# Patient Record
Sex: Female | Born: 1950
Health system: Southern US, Community
[De-identification: ages and names within clinical notes are randomized; demographics above are authoritative.]

## PROBLEM LIST (undated history)

## (undated) DIAGNOSIS — G4700 Insomnia, unspecified: Secondary | ICD-10-CM

## (undated) DIAGNOSIS — E039 Hypothyroidism, unspecified: Secondary | ICD-10-CM

## (undated) DIAGNOSIS — J449 Chronic obstructive pulmonary disease, unspecified: Secondary | ICD-10-CM

## (undated) DIAGNOSIS — E669 Obesity, unspecified: Secondary | ICD-10-CM

## (undated) DIAGNOSIS — E785 Hyperlipidemia, unspecified: Secondary | ICD-10-CM

## (undated) DIAGNOSIS — K219 Gastro-esophageal reflux disease without esophagitis: Secondary | ICD-10-CM

## (undated) DIAGNOSIS — M1990 Unspecified osteoarthritis, unspecified site: Secondary | ICD-10-CM

## (undated) DIAGNOSIS — F419 Anxiety disorder, unspecified: Secondary | ICD-10-CM

## (undated) DIAGNOSIS — F329 Major depressive disorder, single episode, unspecified: Secondary | ICD-10-CM

## (undated) DIAGNOSIS — E109 Type 1 diabetes mellitus without complications: Secondary | ICD-10-CM

## (undated) DIAGNOSIS — I10 Essential (primary) hypertension: Secondary | ICD-10-CM

## (undated) HISTORY — DX: Type 1 diabetes mellitus without complications: E10.9

## (undated) HISTORY — PX: BREAST EXCISIONAL BIOPSY: SUR124

## (undated) HISTORY — PX: TONSILLECTOMY: SUR1361

## (undated) HISTORY — DX: Anxiety disorder, unspecified: F41.9

## (undated) HISTORY — DX: Major depressive disorder, single episode, unspecified: F32.9

## (undated) HISTORY — DX: Gastro-esophageal reflux disease without esophagitis: K21.9

## (undated) HISTORY — DX: Obesity, unspecified: E66.9

## (undated) HISTORY — DX: Unspecified osteoarthritis, unspecified site: M19.90

## (undated) HISTORY — PX: APPENDECTOMY: SHX54

## (undated) HISTORY — DX: Chronic obstructive pulmonary disease, unspecified: J44.9

## (undated) HISTORY — PX: TOTAL ABDOMINAL HYSTERECTOMY W/ BILATERAL SALPINGOOPHORECTOMY: SHX83

## (undated) HISTORY — DX: Essential (primary) hypertension: I10

## (undated) HISTORY — DX: Hyperlipidemia, unspecified: E78.5

## (undated) HISTORY — DX: Hypothyroidism, unspecified: E03.9

## (undated) HISTORY — PX: OTHER SURGICAL HISTORY: SHX169

## (undated) HISTORY — DX: Insomnia, unspecified: G47.00

---

## 1981-06-02 DIAGNOSIS — I10 Essential (primary) hypertension: Secondary | ICD-10-CM

## 1981-06-02 DIAGNOSIS — E109 Type 1 diabetes mellitus without complications: Secondary | ICD-10-CM

## 1981-06-02 HISTORY — DX: Essential (primary) hypertension: I10

## 1981-06-02 HISTORY — DX: Type 1 diabetes mellitus without complications: E10.9

## 2002-03-01 ENCOUNTER — Ambulatory Visit (HOSPITAL_COMMUNITY): Admission: RE | Admit: 2002-03-01 | Discharge: 2002-03-01 | Payer: Self-pay | Admitting: Obstetrics

## 2002-03-01 ENCOUNTER — Encounter: Payer: Self-pay | Admitting: Obstetrics

## 2002-06-02 DIAGNOSIS — E039 Hypothyroidism, unspecified: Secondary | ICD-10-CM

## 2002-06-02 HISTORY — DX: Hypothyroidism, unspecified: E03.9

## 2002-09-07 ENCOUNTER — Encounter: Payer: Self-pay | Admitting: Family Medicine

## 2002-09-07 ENCOUNTER — Ambulatory Visit (HOSPITAL_COMMUNITY): Admission: RE | Admit: 2002-09-07 | Discharge: 2002-09-07 | Payer: Self-pay | Admitting: Family Medicine

## 2002-09-13 ENCOUNTER — Ambulatory Visit (HOSPITAL_COMMUNITY): Admission: RE | Admit: 2002-09-13 | Discharge: 2002-09-13 | Payer: Self-pay | Admitting: Family Medicine

## 2002-09-13 ENCOUNTER — Encounter: Payer: Self-pay | Admitting: Family Medicine

## 2002-09-16 ENCOUNTER — Ambulatory Visit (HOSPITAL_COMMUNITY): Admission: RE | Admit: 2002-09-16 | Discharge: 2002-09-16 | Payer: Self-pay | Admitting: Family Medicine

## 2002-09-16 ENCOUNTER — Encounter: Payer: Self-pay | Admitting: Family Medicine

## 2002-10-03 ENCOUNTER — Ambulatory Visit (HOSPITAL_COMMUNITY): Admission: RE | Admit: 2002-10-03 | Discharge: 2002-10-03 | Payer: Self-pay | Admitting: Otolaryngology

## 2002-10-03 ENCOUNTER — Encounter: Payer: Self-pay | Admitting: Otolaryngology

## 2002-10-26 ENCOUNTER — Encounter (INDEPENDENT_AMBULATORY_CARE_PROVIDER_SITE_OTHER): Payer: Self-pay | Admitting: Specialist

## 2002-10-26 ENCOUNTER — Ambulatory Visit (HOSPITAL_COMMUNITY): Admission: RE | Admit: 2002-10-26 | Discharge: 2002-10-27 | Payer: Self-pay | Admitting: Otolaryngology

## 2002-11-01 ENCOUNTER — Emergency Department (HOSPITAL_COMMUNITY): Admission: EM | Admit: 2002-11-01 | Discharge: 2002-11-02 | Payer: Self-pay | Admitting: Emergency Medicine

## 2003-06-23 ENCOUNTER — Ambulatory Visit (HOSPITAL_COMMUNITY): Admission: RE | Admit: 2003-06-23 | Discharge: 2003-06-23 | Payer: Self-pay | Admitting: Family Medicine

## 2004-05-03 ENCOUNTER — Ambulatory Visit (HOSPITAL_COMMUNITY): Admission: RE | Admit: 2004-05-03 | Discharge: 2004-05-03 | Payer: Self-pay | Admitting: Family Medicine

## 2004-05-07 ENCOUNTER — Inpatient Hospital Stay (HOSPITAL_COMMUNITY): Admission: EM | Admit: 2004-05-07 | Discharge: 2004-05-10 | Payer: Self-pay | Admitting: Emergency Medicine

## 2004-05-07 ENCOUNTER — Ambulatory Visit (HOSPITAL_COMMUNITY): Admission: RE | Admit: 2004-05-07 | Discharge: 2004-05-07 | Payer: Self-pay | Admitting: Family Medicine

## 2004-05-07 ENCOUNTER — Ambulatory Visit: Payer: Self-pay | Admitting: Family Medicine

## 2004-05-17 ENCOUNTER — Ambulatory Visit: Payer: Self-pay | Admitting: Family Medicine

## 2004-06-13 ENCOUNTER — Inpatient Hospital Stay (HOSPITAL_COMMUNITY): Admission: RE | Admit: 2004-06-13 | Discharge: 2004-06-28 | Payer: Self-pay | Admitting: Thoracic Surgery

## 2004-06-13 ENCOUNTER — Ambulatory Visit: Payer: Self-pay | Admitting: Pulmonary Disease

## 2004-06-26 ENCOUNTER — Ambulatory Visit: Payer: Self-pay | Admitting: Physical Medicine & Rehabilitation

## 2004-06-28 ENCOUNTER — Inpatient Hospital Stay (HOSPITAL_COMMUNITY)
Admission: RE | Admit: 2004-06-28 | Discharge: 2004-07-05 | Payer: Self-pay | Admitting: Physical Medicine & Rehabilitation

## 2004-07-17 ENCOUNTER — Ambulatory Visit: Payer: Self-pay | Admitting: Family Medicine

## 2004-07-29 ENCOUNTER — Emergency Department (HOSPITAL_COMMUNITY): Admission: EM | Admit: 2004-07-29 | Discharge: 2004-07-30 | Payer: Self-pay | Admitting: *Deleted

## 2004-08-11 ENCOUNTER — Inpatient Hospital Stay (HOSPITAL_COMMUNITY): Admission: EM | Admit: 2004-08-11 | Discharge: 2004-08-13 | Payer: Self-pay | Admitting: Emergency Medicine

## 2004-08-15 ENCOUNTER — Ambulatory Visit (HOSPITAL_COMMUNITY): Admission: RE | Admit: 2004-08-15 | Discharge: 2004-08-15 | Payer: Self-pay | Admitting: Thoracic Surgery

## 2004-08-21 ENCOUNTER — Ambulatory Visit: Payer: Self-pay | Admitting: Family Medicine

## 2004-08-27 ENCOUNTER — Inpatient Hospital Stay (HOSPITAL_COMMUNITY): Admission: EM | Admit: 2004-08-27 | Discharge: 2004-08-29 | Payer: Self-pay | Admitting: Emergency Medicine

## 2004-09-02 ENCOUNTER — Ambulatory Visit (HOSPITAL_COMMUNITY): Admission: RE | Admit: 2004-09-02 | Discharge: 2004-09-02 | Payer: Self-pay | Admitting: Otolaryngology

## 2004-09-26 ENCOUNTER — Ambulatory Visit: Payer: Self-pay | Admitting: Family Medicine

## 2004-10-04 ENCOUNTER — Ambulatory Visit (HOSPITAL_COMMUNITY): Admission: RE | Admit: 2004-10-04 | Discharge: 2004-10-04 | Payer: Self-pay | Admitting: Family Medicine

## 2004-10-04 ENCOUNTER — Encounter: Payer: Self-pay | Admitting: Family Medicine

## 2004-10-21 ENCOUNTER — Ambulatory Visit: Payer: Self-pay | Admitting: Family Medicine

## 2004-10-23 ENCOUNTER — Encounter: Admission: RE | Admit: 2004-10-23 | Discharge: 2004-10-23 | Payer: Self-pay | Admitting: Thoracic Surgery

## 2004-11-18 ENCOUNTER — Emergency Department (HOSPITAL_COMMUNITY): Admission: EM | Admit: 2004-11-18 | Discharge: 2004-11-18 | Payer: Self-pay | Admitting: Emergency Medicine

## 2004-11-18 ENCOUNTER — Emergency Department (HOSPITAL_COMMUNITY): Admission: EM | Admit: 2004-11-18 | Discharge: 2004-11-18 | Payer: Self-pay | Admitting: *Deleted

## 2004-11-26 ENCOUNTER — Ambulatory Visit: Payer: Self-pay | Admitting: Family Medicine

## 2004-12-06 ENCOUNTER — Observation Stay (HOSPITAL_COMMUNITY): Admission: EM | Admit: 2004-12-06 | Discharge: 2004-12-07 | Payer: Self-pay | Admitting: Emergency Medicine

## 2004-12-19 ENCOUNTER — Ambulatory Visit: Payer: Self-pay | Admitting: Family Medicine

## 2005-02-07 ENCOUNTER — Ambulatory Visit: Payer: Self-pay | Admitting: Family Medicine

## 2005-02-14 ENCOUNTER — Ambulatory Visit (HOSPITAL_COMMUNITY): Admission: RE | Admit: 2005-02-14 | Discharge: 2005-02-14 | Payer: Self-pay | Admitting: Family Medicine

## 2005-02-25 ENCOUNTER — Encounter: Admission: RE | Admit: 2005-02-25 | Discharge: 2005-02-25 | Payer: Self-pay | Admitting: Thoracic Surgery

## 2005-03-31 ENCOUNTER — Ambulatory Visit: Payer: Self-pay | Admitting: Family Medicine

## 2005-05-21 ENCOUNTER — Ambulatory Visit: Payer: Self-pay | Admitting: Family Medicine

## 2005-06-02 DIAGNOSIS — E785 Hyperlipidemia, unspecified: Secondary | ICD-10-CM

## 2005-06-02 HISTORY — DX: Hyperlipidemia, unspecified: E78.5

## 2005-06-23 ENCOUNTER — Ambulatory Visit: Payer: Self-pay | Admitting: Family Medicine

## 2005-07-15 ENCOUNTER — Ambulatory Visit: Payer: Self-pay | Admitting: Pulmonary Disease

## 2005-08-29 ENCOUNTER — Ambulatory Visit: Payer: Self-pay | Admitting: Family Medicine

## 2005-09-03 ENCOUNTER — Encounter: Admission: RE | Admit: 2005-09-03 | Discharge: 2005-09-03 | Payer: Self-pay | Admitting: Thoracic Surgery

## 2005-10-07 ENCOUNTER — Ambulatory Visit: Payer: Self-pay | Admitting: Family Medicine

## 2005-11-26 ENCOUNTER — Encounter: Admission: RE | Admit: 2005-11-26 | Discharge: 2005-11-26 | Payer: Self-pay | Admitting: Unknown Physician Specialty

## 2005-11-26 ENCOUNTER — Ambulatory Visit (HOSPITAL_COMMUNITY): Admission: RE | Admit: 2005-11-26 | Discharge: 2005-11-26 | Payer: Self-pay | Admitting: Family Medicine

## 2005-11-26 LAB — HM COLONOSCOPY: HM Colonoscopy: NORMAL

## 2005-12-01 ENCOUNTER — Ambulatory Visit (HOSPITAL_COMMUNITY): Admission: RE | Admit: 2005-12-01 | Discharge: 2005-12-01 | Payer: Self-pay | Admitting: Family Medicine

## 2005-12-02 ENCOUNTER — Ambulatory Visit: Payer: Self-pay | Admitting: Family Medicine

## 2005-12-09 ENCOUNTER — Ambulatory Visit: Payer: Self-pay | Admitting: Internal Medicine

## 2005-12-30 ENCOUNTER — Ambulatory Visit: Payer: Self-pay | Admitting: Family Medicine

## 2006-02-27 ENCOUNTER — Ambulatory Visit: Payer: Self-pay | Admitting: Family Medicine

## 2006-04-09 ENCOUNTER — Ambulatory Visit: Payer: Self-pay | Admitting: Family Medicine

## 2006-04-30 ENCOUNTER — Ambulatory Visit (HOSPITAL_COMMUNITY): Admission: RE | Admit: 2006-04-30 | Discharge: 2006-04-30 | Payer: Self-pay | Admitting: Pulmonary Disease

## 2006-05-11 ENCOUNTER — Ambulatory Visit (HOSPITAL_COMMUNITY): Admission: RE | Admit: 2006-05-11 | Discharge: 2006-05-11 | Payer: Self-pay | Admitting: Pulmonary Disease

## 2006-06-02 DIAGNOSIS — F32A Depression, unspecified: Secondary | ICD-10-CM

## 2006-06-02 HISTORY — DX: Depression, unspecified: F32.A

## 2006-06-04 ENCOUNTER — Ambulatory Visit: Payer: Self-pay | Admitting: Family Medicine

## 2006-06-15 ENCOUNTER — Encounter: Payer: Self-pay | Admitting: Family Medicine

## 2006-06-15 LAB — CONVERTED CEMR LAB: Hgb A1c MFr Bld: 7.7 % — ABNORMAL HIGH (ref 4.6–6.1)

## 2006-06-22 ENCOUNTER — Ambulatory Visit: Payer: Self-pay | Admitting: Family Medicine

## 2006-06-22 LAB — CONVERTED CEMR LAB
Glucose, Bld: 119 mg/dL — ABNORMAL HIGH (ref 70–99)
Potassium: 4 meq/L (ref 3.5–5.3)
TSH: 1.783 microintl units/mL (ref 0.350–5.50)

## 2006-07-21 ENCOUNTER — Encounter: Payer: Self-pay | Admitting: Family Medicine

## 2006-07-21 LAB — CONVERTED CEMR LAB
BUN: 25 mg/dL — ABNORMAL HIGH (ref 6–23)
Calcium: 9.8 mg/dL (ref 8.4–10.5)
Creatinine, Ser: 1.3 mg/dL — ABNORMAL HIGH (ref 0.40–1.20)
Glucose, Bld: 190 mg/dL — ABNORMAL HIGH (ref 70–99)
Potassium: 4.6 meq/L (ref 3.5–5.3)

## 2006-11-19 ENCOUNTER — Ambulatory Visit: Payer: Self-pay | Admitting: Family Medicine

## 2006-11-30 ENCOUNTER — Ambulatory Visit (HOSPITAL_COMMUNITY): Admission: RE | Admit: 2006-11-30 | Discharge: 2006-11-30 | Payer: Self-pay | Admitting: Family Medicine

## 2006-12-01 ENCOUNTER — Ambulatory Visit (HOSPITAL_COMMUNITY): Payer: Self-pay | Admitting: Psychology

## 2006-12-02 ENCOUNTER — Encounter: Payer: Self-pay | Admitting: Family Medicine

## 2006-12-02 LAB — CONVERTED CEMR LAB
ALT: 8 units/L (ref 0–35)
AST: 11 units/L (ref 0–37)
Albumin: 4 g/dL (ref 3.5–5.2)
Alkaline Phosphatase: 109 units/L (ref 39–117)
Microalb, Ur: 0.72 mg/dL (ref 0.00–1.89)
Potassium: 5.1 meq/L (ref 3.5–5.3)
Total Protein: 6.8 g/dL (ref 6.0–8.3)

## 2006-12-16 ENCOUNTER — Ambulatory Visit (HOSPITAL_COMMUNITY): Payer: Self-pay | Admitting: Psychology

## 2006-12-17 ENCOUNTER — Encounter: Payer: Self-pay | Admitting: Family Medicine

## 2006-12-17 LAB — CONVERTED CEMR LAB
BUN: 29 mg/dL — ABNORMAL HIGH (ref 6–23)
CO2: 22 meq/L (ref 19–32)
Chloride: 106 meq/L (ref 96–112)
Glucose, Bld: 134 mg/dL — ABNORMAL HIGH (ref 70–99)

## 2006-12-31 ENCOUNTER — Ambulatory Visit: Payer: Self-pay | Admitting: Family Medicine

## 2007-01-18 ENCOUNTER — Ambulatory Visit (HOSPITAL_COMMUNITY): Payer: Self-pay | Admitting: Psychology

## 2007-02-25 ENCOUNTER — Emergency Department (HOSPITAL_COMMUNITY): Admission: EM | Admit: 2007-02-25 | Discharge: 2007-02-25 | Payer: Self-pay | Admitting: Emergency Medicine

## 2007-02-26 ENCOUNTER — Ambulatory Visit: Payer: Self-pay | Admitting: Family Medicine

## 2007-02-26 ENCOUNTER — Ambulatory Visit (HOSPITAL_COMMUNITY): Payer: Self-pay | Admitting: Psychology

## 2007-03-26 ENCOUNTER — Ambulatory Visit (HOSPITAL_COMMUNITY): Payer: Self-pay | Admitting: Psychology

## 2007-04-01 ENCOUNTER — Ambulatory Visit (HOSPITAL_COMMUNITY): Payer: Self-pay | Admitting: Psychiatry

## 2007-04-01 ENCOUNTER — Encounter: Payer: Self-pay | Admitting: Family Medicine

## 2007-04-01 LAB — CONVERTED CEMR LAB
ALT: 8 units/L (ref 0–35)
BUN: 23 mg/dL (ref 6–23)
CO2: 22 meq/L (ref 19–32)
Chloride: 106 meq/L (ref 96–112)
Cholesterol: 148 mg/dL (ref 0–200)
Creatinine, Ser: 1.65 mg/dL — ABNORMAL HIGH (ref 0.40–1.20)
Glucose, Bld: 178 mg/dL — ABNORMAL HIGH (ref 70–99)
Indirect Bilirubin: 0.2 mg/dL (ref 0.0–0.9)
LDL Cholesterol: 82 mg/dL (ref 0–99)
Sodium: 141 meq/L (ref 135–145)
Total CHOL/HDL Ratio: 3.6
Triglycerides: 126 mg/dL (ref <150)

## 2007-04-08 ENCOUNTER — Ambulatory Visit: Payer: Self-pay | Admitting: Family Medicine

## 2007-04-13 ENCOUNTER — Ambulatory Visit: Payer: Self-pay | Admitting: Family Medicine

## 2007-04-28 ENCOUNTER — Ambulatory Visit (HOSPITAL_COMMUNITY): Payer: Self-pay | Admitting: Psychology

## 2007-04-28 ENCOUNTER — Ambulatory Visit: Payer: Self-pay | Admitting: Family Medicine

## 2007-06-03 ENCOUNTER — Encounter: Payer: Self-pay | Admitting: Family Medicine

## 2007-06-09 ENCOUNTER — Encounter: Payer: Self-pay | Admitting: Family Medicine

## 2007-06-09 LAB — CONVERTED CEMR LAB: TSH: 2.546 microintl units/mL (ref 0.350–5.50)

## 2007-07-27 ENCOUNTER — Ambulatory Visit: Payer: Self-pay | Admitting: Family Medicine

## 2007-07-27 ENCOUNTER — Ambulatory Visit (HOSPITAL_COMMUNITY): Payer: Self-pay | Admitting: Psychology

## 2007-07-29 ENCOUNTER — Ambulatory Visit: Payer: Self-pay | Admitting: Family Medicine

## 2007-08-24 ENCOUNTER — Ambulatory Visit (HOSPITAL_COMMUNITY): Payer: Self-pay | Admitting: Psychology

## 2007-08-31 ENCOUNTER — Encounter: Payer: Self-pay | Admitting: Family Medicine

## 2007-08-31 LAB — CONVERTED CEMR LAB
ALT: 10 units/L (ref 0–35)
Albumin: 4 g/dL (ref 3.5–5.2)
Alkaline Phosphatase: 110 units/L (ref 39–117)
BUN: 39 mg/dL — ABNORMAL HIGH (ref 6–23)
Bilirubin, Direct: <0.1 mg/dL (ref 0.0–0.3)
Calcium: 9 mg/dL (ref 8.4–10.5)
Chloride: 105 meq/L (ref 96–112)
Creatinine, Ser: 1.83 mg/dL — ABNORMAL HIGH (ref 0.40–1.20)
Glucose, Bld: 228 mg/dL — ABNORMAL HIGH (ref 70–99)
HDL: 42 mg/dL (ref >39)
Total CHOL/HDL Ratio: 4

## 2007-09-02 ENCOUNTER — Ambulatory Visit: Payer: Self-pay | Admitting: Family Medicine

## 2007-09-06 ENCOUNTER — Telehealth: Payer: Self-pay | Admitting: Family Medicine

## 2007-09-08 DIAGNOSIS — K219 Gastro-esophageal reflux disease without esophagitis: Secondary | ICD-10-CM | POA: Insufficient documentation

## 2007-09-08 DIAGNOSIS — F419 Anxiety disorder, unspecified: Secondary | ICD-10-CM

## 2007-09-08 DIAGNOSIS — E039 Hypothyroidism, unspecified: Secondary | ICD-10-CM

## 2007-09-08 DIAGNOSIS — G4700 Insomnia, unspecified: Secondary | ICD-10-CM | POA: Insufficient documentation

## 2007-09-08 DIAGNOSIS — F329 Major depressive disorder, single episode, unspecified: Secondary | ICD-10-CM | POA: Insufficient documentation

## 2007-09-08 DIAGNOSIS — E785 Hyperlipidemia, unspecified: Secondary | ICD-10-CM | POA: Insufficient documentation

## 2007-09-08 DIAGNOSIS — J449 Chronic obstructive pulmonary disease, unspecified: Secondary | ICD-10-CM | POA: Insufficient documentation

## 2007-09-08 DIAGNOSIS — M479 Spondylosis, unspecified: Secondary | ICD-10-CM | POA: Insufficient documentation

## 2007-09-27 ENCOUNTER — Encounter: Payer: Self-pay | Admitting: Family Medicine

## 2007-09-27 LAB — CONVERTED CEMR LAB
BUN: 46 mg/dL — ABNORMAL HIGH (ref 6–23)
Calcium: 9 mg/dL (ref 8.4–10.5)
Chloride: 108 meq/L (ref 96–112)
Creatinine, Ser: 1.66 mg/dL — ABNORMAL HIGH (ref 0.40–1.20)
Potassium: 5 meq/L (ref 3.5–5.3)
Sodium: 138 meq/L (ref 135–145)

## 2007-10-11 ENCOUNTER — Ambulatory Visit (HOSPITAL_COMMUNITY): Payer: Self-pay | Admitting: Psychology

## 2007-11-10 ENCOUNTER — Ambulatory Visit (HOSPITAL_COMMUNITY): Payer: Self-pay | Admitting: Psychology

## 2007-11-10 ENCOUNTER — Ambulatory Visit: Payer: Self-pay | Admitting: Family Medicine

## 2008-02-04 ENCOUNTER — Ambulatory Visit (HOSPITAL_COMMUNITY): Payer: Self-pay | Admitting: Psychology

## 2008-03-06 ENCOUNTER — Ambulatory Visit: Payer: Self-pay | Admitting: Family Medicine

## 2008-03-06 ENCOUNTER — Telehealth: Payer: Self-pay | Admitting: Family Medicine

## 2008-03-06 DIAGNOSIS — I739 Peripheral vascular disease, unspecified: Secondary | ICD-10-CM

## 2008-03-06 LAB — CONVERTED CEMR LAB: Hgb A1c MFr Bld: 8.3 %

## 2008-03-07 DIAGNOSIS — I10 Essential (primary) hypertension: Secondary | ICD-10-CM

## 2008-03-08 LAB — CONVERTED CEMR LAB
AST: 13 units/L (ref 0–37)
Albumin: 3.8 g/dL (ref 3.5–5.2)
Alkaline Phosphatase: 97 units/L (ref 39–117)
Bilirubin, Direct: <0.1 mg/dL (ref 0.0–0.3)
CO2: 20 meq/L (ref 19–32)
Calcium: 9.2 mg/dL (ref 8.4–10.5)
Creatinine, Ser: 1.52 mg/dL — ABNORMAL HIGH (ref 0.40–1.20)
Eosinophils Relative: 9 % — ABNORMAL HIGH (ref 0–5)
Hemoglobin: 10.9 g/dL — ABNORMAL LOW (ref 12.0–15.0)
Lymphs Abs: 4 10*3/uL (ref 0.7–4.0)
MCV: 94.8 fL (ref 78.0–100.0)
Microalb Creat Ratio: 2.8 mg/g (ref 0.0–30.0)
Neutro Abs: 5.5 10*3/uL (ref 1.7–7.7)
Neutrophils Relative %: 48 % (ref 43–77)
Potassium: 5.4 meq/L — ABNORMAL HIGH (ref 3.5–5.3)
RBC: 3.83 M/uL — ABNORMAL LOW (ref 3.87–5.11)
Total Bilirubin: 0.2 mg/dL — ABNORMAL LOW (ref 0.3–1.2)
Total CHOL/HDL Ratio: 3.5
Triglycerides: 149 mg/dL (ref <150)
VLDL: 30 mg/dL (ref 0–40)
WBC: 11.3 10*3/uL — ABNORMAL HIGH (ref 4.0–10.5)

## 2008-03-14 ENCOUNTER — Ambulatory Visit (HOSPITAL_COMMUNITY): Admission: RE | Admit: 2008-03-14 | Discharge: 2008-03-14 | Payer: Self-pay | Admitting: Family Medicine

## 2008-03-14 ENCOUNTER — Encounter: Payer: Self-pay | Admitting: Family Medicine

## 2008-03-21 ENCOUNTER — Ambulatory Visit (HOSPITAL_COMMUNITY): Payer: Self-pay | Admitting: Psychology

## 2008-04-06 ENCOUNTER — Telehealth: Payer: Self-pay | Admitting: Family Medicine

## 2008-04-11 ENCOUNTER — Encounter: Payer: Self-pay | Admitting: Family Medicine

## 2008-04-20 ENCOUNTER — Telehealth: Payer: Self-pay | Admitting: Family Medicine

## 2008-04-20 ENCOUNTER — Ambulatory Visit (HOSPITAL_COMMUNITY): Payer: Self-pay | Admitting: Psychology

## 2008-05-19 ENCOUNTER — Ambulatory Visit (HOSPITAL_COMMUNITY): Payer: Self-pay | Admitting: Psychology

## 2008-06-29 ENCOUNTER — Ambulatory Visit (HOSPITAL_COMMUNITY): Payer: Self-pay | Admitting: Psychology

## 2008-07-04 ENCOUNTER — Encounter: Payer: Self-pay | Admitting: Family Medicine

## 2008-07-05 ENCOUNTER — Telehealth: Payer: Self-pay | Admitting: Family Medicine

## 2008-07-17 ENCOUNTER — Ambulatory Visit: Payer: Self-pay | Admitting: Family Medicine

## 2008-07-17 DIAGNOSIS — E1165 Type 2 diabetes mellitus with hyperglycemia: Secondary | ICD-10-CM

## 2008-07-25 ENCOUNTER — Ambulatory Visit (HOSPITAL_COMMUNITY): Payer: Self-pay | Admitting: Psychology

## 2008-08-03 ENCOUNTER — Telehealth: Payer: Self-pay | Admitting: Family Medicine

## 2008-08-04 ENCOUNTER — Encounter: Payer: Self-pay | Admitting: Family Medicine

## 2008-08-07 ENCOUNTER — Telehealth: Payer: Self-pay | Admitting: Family Medicine

## 2008-08-08 ENCOUNTER — Encounter: Payer: Self-pay | Admitting: Family Medicine

## 2008-08-08 ENCOUNTER — Telehealth: Payer: Self-pay | Admitting: Family Medicine

## 2008-08-21 ENCOUNTER — Telehealth: Payer: Self-pay | Admitting: Family Medicine

## 2008-08-21 ENCOUNTER — Encounter: Payer: Self-pay | Admitting: Family Medicine

## 2008-09-22 ENCOUNTER — Ambulatory Visit (HOSPITAL_COMMUNITY): Payer: Self-pay | Admitting: Psychology

## 2008-09-22 ENCOUNTER — Encounter: Payer: Self-pay | Admitting: Family Medicine

## 2008-09-25 LAB — CONVERTED CEMR LAB
ALT: 12 units/L (ref 0–35)
AST: 15 units/L (ref 0–37)
Albumin: 4.1 g/dL (ref 3.5–5.2)
Alkaline Phosphatase: 106 units/L (ref 39–117)
BUN: 25 mg/dL — ABNORMAL HIGH (ref 6–23)
Bilirubin, Direct: 0.1 mg/dL (ref 0.0–0.3)
CO2: 23 meq/L (ref 19–32)
Calcium: 9.5 mg/dL (ref 8.4–10.5)
Chloride: 101 meq/L (ref 96–112)
Cholesterol: 175 mg/dL (ref 0–200)
Creatinine, Ser: 1.38 mg/dL — ABNORMAL HIGH (ref 0.40–1.20)
Glucose, Bld: 60 mg/dL — ABNORMAL LOW (ref 70–99)
HDL: 54 mg/dL (ref >39)
Indirect Bilirubin: 0.2 mg/dL (ref 0.0–0.9)
LDL Cholesterol: 98 mg/dL (ref 0–99)
Potassium: 4.6 meq/L (ref 3.5–5.3)
Sodium: 138 meq/L (ref 135–145)
TSH: 6.631 microintl units/mL — ABNORMAL HIGH (ref 0.350–4.500)
Total Bilirubin: 0.3 mg/dL (ref 0.3–1.2)
Total CHOL/HDL Ratio: 3.2
Total Protein: 7.4 g/dL (ref 6.0–8.3)
Triglycerides: 115 mg/dL (ref <150)
VLDL: 23 mg/dL (ref 0–40)

## 2008-10-04 ENCOUNTER — Telehealth: Payer: Self-pay | Admitting: Family Medicine

## 2008-10-04 ENCOUNTER — Ambulatory Visit: Payer: Self-pay | Admitting: Family Medicine

## 2008-10-07 DIAGNOSIS — J309 Allergic rhinitis, unspecified: Secondary | ICD-10-CM | POA: Insufficient documentation

## 2008-11-06 ENCOUNTER — Ambulatory Visit: Payer: Self-pay | Admitting: Family Medicine

## 2008-11-06 DIAGNOSIS — R609 Edema, unspecified: Secondary | ICD-10-CM

## 2008-11-07 ENCOUNTER — Ambulatory Visit (HOSPITAL_COMMUNITY): Payer: Self-pay | Admitting: Psychology

## 2008-11-13 ENCOUNTER — Encounter: Payer: Self-pay | Admitting: Family Medicine

## 2008-12-16 ENCOUNTER — Encounter: Payer: Self-pay | Admitting: Family Medicine

## 2008-12-18 ENCOUNTER — Ambulatory Visit: Payer: Self-pay | Admitting: Family Medicine

## 2008-12-18 ENCOUNTER — Ambulatory Visit (HOSPITAL_COMMUNITY): Payer: Self-pay | Admitting: Psychology

## 2008-12-18 DIAGNOSIS — G4730 Sleep apnea, unspecified: Secondary | ICD-10-CM | POA: Insufficient documentation

## 2008-12-18 LAB — CONVERTED CEMR LAB: TSH: 6.044 microintl units/mL — ABNORMAL HIGH (ref 0.350–4.500)

## 2008-12-31 DIAGNOSIS — F17200 Nicotine dependence, unspecified, uncomplicated: Secondary | ICD-10-CM | POA: Insufficient documentation

## 2009-01-31 ENCOUNTER — Telehealth: Payer: Self-pay | Admitting: Family Medicine

## 2009-02-20 ENCOUNTER — Encounter: Payer: Self-pay | Admitting: Family Medicine

## 2009-02-20 DIAGNOSIS — R5383 Other fatigue: Secondary | ICD-10-CM

## 2009-02-20 DIAGNOSIS — R5381 Other malaise: Secondary | ICD-10-CM

## 2009-02-21 ENCOUNTER — Ambulatory Visit: Payer: Self-pay | Admitting: Family Medicine

## 2009-02-21 LAB — CONVERTED CEMR LAB
AST: 14 units/L (ref 0–37)
Albumin: 3.9 g/dL (ref 3.5–5.2)
BUN: 37 mg/dL — ABNORMAL HIGH (ref 6–23)
Cholesterol: 124 mg/dL (ref 0–200)
Creatinine, Ser: 1.36 mg/dL — ABNORMAL HIGH (ref 0.40–1.20)
Eosinophils Relative: 11 % — ABNORMAL HIGH (ref 0–5)
Glucose, Bld: 175 mg/dL — ABNORMAL HIGH (ref 70–99)
Glucose, Bld: 228 mg/dL
HCT: 36.5 % (ref 36.0–46.0)
HDL: 35 mg/dL — ABNORMAL LOW (ref >39)
Hemoglobin: 11.2 g/dL — ABNORMAL LOW (ref 12.0–15.0)
Hgb A1c MFr Bld: 8.1 %
LDL Cholesterol: 68 mg/dL (ref 0–99)
Lymphocytes Relative: 30 % (ref 12–46)
Monocytes Absolute: 0.6 10*3/uL (ref 0.1–1.0)
Neutrophils Relative %: 51 % (ref 43–77)
Platelets: 297 10*3/uL (ref 150–400)
RBC: 3.88 M/uL (ref 3.87–5.11)
RDW: 13.3 % (ref 11.5–15.5)
Total Bilirubin: 0.3 mg/dL (ref 0.3–1.2)
VLDL: 21 mg/dL (ref 0–40)
WBC: 8.9 10*3/uL (ref 4.0–10.5)

## 2009-02-22 ENCOUNTER — Telehealth: Payer: Self-pay | Admitting: Family Medicine

## 2009-03-08 ENCOUNTER — Telehealth: Payer: Self-pay | Admitting: Family Medicine

## 2009-03-19 ENCOUNTER — Encounter: Payer: Self-pay | Admitting: Family Medicine

## 2009-03-19 ENCOUNTER — Encounter (HOSPITAL_COMMUNITY): Admission: RE | Admit: 2009-03-19 | Discharge: 2009-04-18 | Payer: Self-pay | Admitting: Family Medicine

## 2009-03-23 ENCOUNTER — Telehealth: Payer: Self-pay | Admitting: Family Medicine

## 2009-03-23 ENCOUNTER — Encounter: Payer: Self-pay | Admitting: Family Medicine

## 2009-04-04 LAB — CONVERTED CEMR LAB
CO2: 28 meq/L (ref 19–32)
Calcium: 9.4 mg/dL (ref 8.4–10.5)
Creatinine, Ser: 1.02 mg/dL (ref 0.40–1.20)
Glucose, Bld: 149 mg/dL — ABNORMAL HIGH (ref 70–99)

## 2009-04-05 ENCOUNTER — Ambulatory Visit: Payer: Self-pay | Admitting: Family Medicine

## 2009-04-10 ENCOUNTER — Ambulatory Visit (HOSPITAL_COMMUNITY): Payer: Self-pay | Admitting: Psychology

## 2009-04-10 ENCOUNTER — Ambulatory Visit (HOSPITAL_COMMUNITY): Admission: RE | Admit: 2009-04-10 | Discharge: 2009-04-10 | Payer: Self-pay | Admitting: Family Medicine

## 2009-04-10 ENCOUNTER — Telehealth: Payer: Self-pay | Admitting: Family Medicine

## 2009-05-03 ENCOUNTER — Telehealth: Payer: Self-pay | Admitting: Family Medicine

## 2009-05-10 ENCOUNTER — Telehealth: Payer: Self-pay | Admitting: Family Medicine

## 2009-05-11 ENCOUNTER — Ambulatory Visit (HOSPITAL_COMMUNITY): Payer: Self-pay | Admitting: Psychology

## 2009-05-14 ENCOUNTER — Telehealth: Payer: Self-pay | Admitting: Family Medicine

## 2009-05-15 ENCOUNTER — Telehealth: Payer: Self-pay | Admitting: Family Medicine

## 2009-05-22 ENCOUNTER — Encounter: Payer: Self-pay | Admitting: Family Medicine

## 2009-05-23 ENCOUNTER — Telehealth: Payer: Self-pay | Admitting: Family Medicine

## 2009-05-23 ENCOUNTER — Ambulatory Visit: Payer: Self-pay | Admitting: Family Medicine

## 2009-05-29 ENCOUNTER — Encounter: Payer: Self-pay | Admitting: Family Medicine

## 2009-06-28 ENCOUNTER — Encounter: Payer: Self-pay | Admitting: Family Medicine

## 2009-08-01 ENCOUNTER — Ambulatory Visit (HOSPITAL_COMMUNITY): Payer: Self-pay | Admitting: Psychology

## 2009-09-05 ENCOUNTER — Ambulatory Visit: Payer: Self-pay | Admitting: Family Medicine

## 2009-09-05 LAB — HM DIABETES FOOT EXAM

## 2009-09-10 LAB — CONVERTED CEMR LAB
ALT: 11 units/L (ref 0–35)
AST: 13 units/L (ref 0–37)
BUN: 22 mg/dL (ref 6–23)
Bilirubin, Direct: 0.1 mg/dL (ref 0.0–0.3)
Calcium: 9.3 mg/dL (ref 8.4–10.5)
Cholesterol: 128 mg/dL (ref 0–200)
Glucose, Bld: 175 mg/dL — ABNORMAL HIGH (ref 70–99)
Hgb A1c MFr Bld: 8.4 % — ABNORMAL HIGH (ref 4.6–6.1)
Indirect Bilirubin: 0.2 mg/dL (ref 0.0–0.9)
VLDL: 21 mg/dL (ref 0–40)

## 2009-09-14 ENCOUNTER — Telehealth: Payer: Self-pay | Admitting: Family Medicine

## 2009-09-30 HISTORY — PX: OTHER SURGICAL HISTORY: SHX169

## 2009-10-15 ENCOUNTER — Telehealth: Payer: Self-pay | Admitting: Family Medicine

## 2009-11-05 ENCOUNTER — Telehealth: Payer: Self-pay | Admitting: Family Medicine

## 2009-11-08 ENCOUNTER — Encounter: Payer: Self-pay | Admitting: Family Medicine

## 2009-11-08 ENCOUNTER — Ambulatory Visit (HOSPITAL_COMMUNITY): Payer: Self-pay | Admitting: Psychology

## 2009-12-25 ENCOUNTER — Encounter: Payer: Self-pay | Admitting: Family Medicine

## 2010-01-09 ENCOUNTER — Ambulatory Visit (HOSPITAL_COMMUNITY): Payer: Self-pay | Admitting: Psychology

## 2010-01-09 ENCOUNTER — Ambulatory Visit: Payer: Self-pay | Admitting: Family Medicine

## 2010-01-09 LAB — CONVERTED CEMR LAB
BUN: 25 mg/dL — ABNORMAL HIGH (ref 6–23)
Chloride: 106 meq/L (ref 96–112)
Hgb A1c MFr Bld: 8.1 % — ABNORMAL HIGH (ref <5.7)
Potassium: 5.4 meq/L — ABNORMAL HIGH (ref 3.5–5.3)
Sodium: 141 meq/L (ref 135–145)

## 2010-01-11 ENCOUNTER — Telehealth: Payer: Self-pay | Admitting: Family Medicine

## 2010-01-15 ENCOUNTER — Telehealth: Payer: Self-pay | Admitting: Family Medicine

## 2010-01-25 ENCOUNTER — Telehealth: Payer: Self-pay | Admitting: Family Medicine

## 2010-01-29 ENCOUNTER — Encounter: Payer: Self-pay | Admitting: Family Medicine

## 2010-02-08 ENCOUNTER — Ambulatory Visit (HOSPITAL_COMMUNITY): Payer: Self-pay | Admitting: Psychology

## 2010-03-08 ENCOUNTER — Ambulatory Visit (HOSPITAL_COMMUNITY): Payer: Self-pay | Admitting: Psychology

## 2010-03-14 ENCOUNTER — Ambulatory Visit: Payer: Self-pay | Admitting: Family Medicine

## 2010-03-14 DIAGNOSIS — E875 Hyperkalemia: Secondary | ICD-10-CM

## 2010-03-14 DIAGNOSIS — M949 Disorder of cartilage, unspecified: Secondary | ICD-10-CM

## 2010-03-14 DIAGNOSIS — M899 Disorder of bone, unspecified: Secondary | ICD-10-CM | POA: Insufficient documentation

## 2010-03-14 DIAGNOSIS — R7301 Impaired fasting glucose: Secondary | ICD-10-CM | POA: Insufficient documentation

## 2010-03-15 ENCOUNTER — Encounter: Payer: Self-pay | Admitting: Family Medicine

## 2010-03-15 LAB — CONVERTED CEMR LAB
CO2: 25 meq/L (ref 19–32)
Chloride: 105 meq/L (ref 96–112)
Creatinine, Ser: 1.27 mg/dL — ABNORMAL HIGH (ref 0.40–1.20)
Potassium: 5 meq/L (ref 3.5–5.3)

## 2010-04-05 ENCOUNTER — Telehealth: Payer: Self-pay | Admitting: Family Medicine

## 2010-04-10 ENCOUNTER — Telehealth: Payer: Self-pay | Admitting: Family Medicine

## 2010-04-11 ENCOUNTER — Telehealth: Payer: Self-pay | Admitting: Family Medicine

## 2010-04-12 ENCOUNTER — Encounter: Payer: Self-pay | Admitting: Family Medicine

## 2010-04-18 ENCOUNTER — Ambulatory Visit (HOSPITAL_COMMUNITY): Payer: Self-pay | Admitting: Psychology

## 2010-04-29 ENCOUNTER — Ambulatory Visit (HOSPITAL_COMMUNITY): Admission: RE | Admit: 2010-04-29 | Payer: Self-pay | Admitting: Family Medicine

## 2010-04-30 ENCOUNTER — Telehealth: Payer: Self-pay | Admitting: Family Medicine

## 2010-05-02 ENCOUNTER — Telehealth: Payer: Self-pay | Admitting: Family Medicine

## 2010-05-07 ENCOUNTER — Ambulatory Visit: Payer: Self-pay | Admitting: Family Medicine

## 2010-05-07 LAB — CONVERTED CEMR LAB
Alkaline Phosphatase: 97 units/L (ref 39–117)
BUN: 15 mg/dL (ref 6–23)
Bilirubin, Direct: 0.1 mg/dL (ref 0.0–0.3)
CO2: 26 meq/L (ref 19–32)
Chloride: 105 meq/L (ref 96–112)
Creatinine, Ser: 1.1 mg/dL (ref 0.40–1.20)
Glucose, Bld: 80 mg/dL (ref 70–99)
Hgb A1c MFr Bld: 9.6 % — ABNORMAL HIGH (ref <5.7)
Indirect Bilirubin: 0.3 mg/dL (ref 0.0–0.9)
LDL Cholesterol: 108 mg/dL — ABNORMAL HIGH (ref 0–99)
Total Bilirubin: 0.4 mg/dL (ref 0.3–1.2)
Triglycerides: 94 mg/dL (ref <150)
VLDL: 19 mg/dL (ref 0–40)
Vit D, 25-Hydroxy: 46 ng/mL (ref 30–89)

## 2010-05-08 ENCOUNTER — Encounter: Payer: Self-pay | Admitting: Family Medicine

## 2010-05-09 ENCOUNTER — Encounter: Payer: Self-pay | Admitting: Family Medicine

## 2010-05-09 LAB — CONVERTED CEMR LAB
Microalb Creat Ratio: 12.1 mg/g (ref 0.0–30.0)
Microalb, Ur: 0.5 mg/dL (ref 0.00–1.89)

## 2010-05-14 ENCOUNTER — Telehealth: Payer: Self-pay | Admitting: Family Medicine

## 2010-05-17 ENCOUNTER — Ambulatory Visit (HOSPITAL_COMMUNITY): Payer: Self-pay | Admitting: Psychology

## 2010-05-22 ENCOUNTER — Telehealth: Payer: Self-pay | Admitting: Family Medicine

## 2010-05-24 ENCOUNTER — Encounter: Payer: Self-pay | Admitting: Family Medicine

## 2010-05-30 ENCOUNTER — Encounter: Payer: Self-pay | Admitting: Family Medicine

## 2010-06-05 ENCOUNTER — Telehealth: Payer: Self-pay | Admitting: Family Medicine

## 2010-06-12 ENCOUNTER — Telehealth: Payer: Self-pay | Admitting: Family Medicine

## 2010-06-17 ENCOUNTER — Ambulatory Visit (HOSPITAL_COMMUNITY)
Admission: RE | Admit: 2010-06-17 | Discharge: 2010-06-17 | Payer: Self-pay | Source: Home / Self Care | Attending: Psychology | Admitting: Psychology

## 2010-06-22 ENCOUNTER — Encounter: Payer: Self-pay | Admitting: Family Medicine

## 2010-06-23 ENCOUNTER — Encounter: Payer: Self-pay | Admitting: Family Medicine

## 2010-07-04 NOTE — Medication Information (Signed)
Summary: [facility]  [facility]   Imported By: Lind Guest 01/29/2010 08:03:21  _____________________________________________________________________  External Attachment:    Type:   Image     Comment:   External Document

## 2010-07-04 NOTE — Progress Notes (Signed)
Summary: strips  Phone Note Call from Patient   Summary of Call: she is changing medical suppliers for her strips  [facility] they told her they sent over the papers just need the dr to sign off on them wants to know will you please have that done if any ? call her Initial call taken by: Lind Guest,  January 25, 2010 7:52 AM  Follow-up for Phone Call        ADVISED TO HAVE THEM REFAX FORM AND WE WILL BE HAPPY TO SEND IN FOR HER  Follow-up by: Adella Hare LPN,  January 25, 2010 2:54 PM

## 2010-07-04 NOTE — Letter (Signed)
Summary: misc.  misc.   Imported By: Curtis Sites 02/18/2010 14:54:31  _____________________________________________________________________  External Attachment:    Type:   Image     Comment:   External Document

## 2010-07-04 NOTE — Letter (Signed)
Summary: medical release  medical release   Imported By: Lind Guest 05/07/2010 16:52:20  _____________________________________________________________________  External Attachment:    Type:   Image     Comment:   External Document

## 2010-07-04 NOTE — Letter (Signed)
Summary: meter & supplies  meter & supplies   Imported By: Lind Guest 05/24/2010 08:47:15  _____________________________________________________________________  External Attachment:    Type:   Image     Comment:   External Document

## 2010-07-04 NOTE — Progress Notes (Signed)
Summary: [facility]  Phone Note Other Incoming   Caller: Adam from [employer] of [facility]: I think that is where he called from, he is going to send over diabetic supply request, he states they are the company that supplies patient with her diabetic supplies.  He also states that the patient states she is testing 5 days a week. Initial call taken by: Curtis Sites,  May 22, 2010 1:46 PM  Follow-up for Phone Call        recieved paperwork Follow-up by: Adella Hare LPN,  May 22, 2010 2:05 PM

## 2010-07-04 NOTE — Assessment & Plan Note (Signed)
Summary: OV   Vital Signs:  Patient profile:   60 year old female Menstrual status:  hysterectomy Height:      65 inches Weight:      290.50 pounds BMI:     48.52 O2 Sat:      89 % on 4 L/min Pulse rate:   89 / minute Pulse rhythm:   regular Resp:     16 per minute BP sitting:   130 / 80  (left arm)  Vitals Entered By: Everitt Amber LPN   Nutrition Counseling: Patient's BMI is greater than 25 and therefore counseled on weight management options.  O2 Flow:  4 L/min CC: Follow up chronic problems   CC:  Follow up chronic problems.  History of Present Illness: Reports  that she is doing fairly well physically, but recently has been under increased mental stress due to family pressure re a sibling with dementia which is progressing. She states that her therapy sesssions are helping her tremendously as far as coping is concerned. Denies recent fever or chills. Denies sinus pressure, nasal congestion , ear pain or sore throat. Denies chest congestion,she has a chronic cough productive of sputum.She is getting over acute symptoms and opts to defer her flu vac. Denies chest pain, palpitations, or leg swelling. Denies abdominal pain, nausea, vomitting, diarrhea or constipation. Denies change in bowel movements or bloody stool. Denies dysuria , frequency, incontinence or hesitancy.  Denies headaches, vertigo, seizures. Denies uncontrolled  depression, ashe has increased nxiety with mild  insomnia. Denies  rash, lesions, or itch.     Current Medications (verified): 1)  Clonidine Hcl 0.1 Mg  Tabs (Clonidine Hcl) .... One Tab By Mouth At Bedtime 2)  Hydrocodone-Acetaminophen 10-500 Mg  Tabs (Hydrocodone-Acetaminophen) .... One Tab By Mouth Qid 3)  Nexium 40 Mg  Cpdr (Esomeprazole Magnesium) .... One Cap By Mouth Once Daily 4)  Furosemide 40 Mg  Tabs (Furosemide) .... One Tab By Mouth Once Daily 5)  Zolpidem Tartrate 10 Mg  Tabs (Zolpidem Tartrate) .... One Tab By Mouth At Bedtime 6)   Alprazolam 1 Mg  Tabs (Alprazolam) .... One Tab By Mouth Qid 7)  Tizanidine Hcl 4 Mg  Tabs (Tizanidine Hcl) .... One Tab By Mouth Two Times A Day 8)  Potassium Chloride 20 Meq/50ml (10%) Liqd (Potassium Chloride) .... 3 Tsp By Mouth Two Times A Day 9)  Advair Diskus 250-50 Mcg/dose Misc (Fluticasone-Salmeterol) .... Use One Puff Two Times A Day 10)  Bayer Breeze 2 Test  Disk (Glucose Blood) .... Uad 11)  Glipizide 10 Mg Tabs (Glipizide) .... Take 1 Tablet By Mouth Two Times A Day 12)  Bd Syringes .... For Use With Lantus Vial 13)  Xopenex Hfa 45 Mcg/act Aero (Levalbuterol Tartrate) .... As Needed 14)  Singulair 10 Mg Tabs (Montelukast Sodium) .... Take 1 Tablet By Mouth Once A Day 15)  Duoneb 0.5-2.5 (3) Mg/4ml Soln (Ipratropium-Albuterol) .... Take 1 Tablet By Mouth Four Times A Day 16)  Crestor 20 Mg Tabs (Rosuvastatin Calcium) .... Take One Tab On Monday and Thursday 17)  Aspir-Low 81 Mg Tbec (Aspirin) .... Take 1 Tablet By Mouth Once A Day 18)  Levothroid 100 Mcg Tabs (Levothyroxine Sodium) .... Take 1 Tablet By Mouth Once A Day 19)  Lisinopril 20 Mg Tabs (Lisinopril) .... Take 1 Tablet By Mouth Once A Day 20)  Diabetic Shoes .... With Inserts X 1 Pair 21)  Bayer Breeze 2 Test  Disk (Glucose Blood) .... Lancets and Strips For Four Times  A Day Testing 22)  Mobic 15 Mg Tabs (Meloxicam) .... Take 1 Tablet By Mouth Once A Day 23)  Vitamin D (Ergocalciferol) 50000 Unit Caps (Ergocalciferol) .... One Capsule Once Weekly 24)  Lantus 100 Unit/ml Soln (Insulin Glargine) .... 80 Units At Bedtime 25)  Bd Insulin Syringe Ultrafine 31g X 5/16" 1 Ml Misc (Insulin Syringe-Needle U-100) .... Use As Directed For Once Daily Testing  Allergies (verified): 1)  ! Codeine  Review of Systems      See HPI General:  Complains of fatigue and sleep disorder. Eyes:  Complains of vision loss-both eyes. Resp:  Complains of chest pain with inspiration, shortness of breath, and sputum productive; chronic. MS:   Complains of joint pain, low back pain, mid back pain, muscle weakness, and stiffness. Psych:  Complains of anxiety, depression, and mental problems; denies panic attacks, suicidal thoughts/plans, thoughts of violence, and unusual visions or sounds. Endo:  Denies cold intolerance, excessive thirst, excessive urination, and heat intolerance. Heme:  Denies abnormal bruising and bleeding. Allergy:  Denies hives or rash and itching eyes.  Physical Exam  General:  Well-developed obese,in no acute distress; alert,appropriate and cooperative throughout examination. HEENT: No facial asymmetry,  EOMI, No sinus tenderness, TM's Clear, oropharynx  pink and moist.   Chest: Clear to auscultation bilaterally. Decreased air entry throughout. CVS: S1, S2, No murmurs, No S3.   Abd: Soft, Nontender.  MS: decreased  ROM spine, hips, shoulders and knees.  Ext: No edema.   CNS: CN 2-12 intact, power and  tone within .reduced sensation in the feet   Skin: Intact, no visible lesions or rashes.  Psych: Good eye contact, normal affect.  Memory fair, not anxious or depressed appearing.    Impression & Recommendations:  Problem # 1:  NICOTINE ADDICTION (ICD-305.1) Assessment Unchanged  Encouraged smoking cessation and discussed different methods for smoking cessation.   Problem # 2:  HYPERTENSION (ICD-401.9) Assessment: Improved  Her updated medication list for this problem includes:    Clonidine Hcl 0.1 Mg Tabs (Clonidine hcl) ..... One tab by mouth at bedtime    Furosemide 40 Mg Tabs (Furosemide) ..... One tab by mouth once daily    Lisinopril 20 Mg Tabs (Lisinopril) .Marland Kitchen... Take 1 tablet by mouth once a day  BP today: 130/80 Prior BP: 140/82 (01/09/2010)  Labs Reviewed: K+: 5.4 (01/09/2010) Creat: : 1.31 (01/09/2010)   Chol: 128 (09/06/2009)   HDL: 37 (09/06/2009)   LDL: 70 (09/06/2009)   TG: 106 (09/06/2009)  Problem # 3:  DIABETES MELLITUS, UNCONTROLLED (ICD-250.02) Assessment: Comment  Only  Her updated medication list for this problem includes:    Glipizide 10 Mg Tabs (Glipizide) .Marland Kitchen... Take 1 tablet by mouth two times a day    Aspir-low 81 Mg Tbec (Aspirin) .Marland Kitchen... Take 1 tablet by mouth once a day    Lisinopril 20 Mg Tabs (Lisinopril) .Marland Kitchen... Take 1 tablet by mouth once a day    Lantus 100 Unit/ml Soln (Insulin glargine) .Marland KitchenMarland KitchenMarland KitchenMarland Kitchen 80 units at bedtime Patient advised to reduce carbs and sweets, commit to regular physical activity, take meds as prescribed, test blood sugars as directed, and attempt to lose weight , to improve blood sugar control.   Labs Reviewed: Creat: 1.31 (01/09/2010)    Reviewed HgBA1c results: 8.1 (01/09/2010)  8.4 (09/06/2009)  Problem # 4:  HYPOTHYROIDISM (ICD-244.9) Assessment: Unchanged  Her updated medication list for this problem includes:    Levothroid 100 Mcg Tabs (Levothyroxine sodium) .Marland Kitchen... Take 1 tablet by mouth once a day  Labs Reviewed: TSH: 2.426 (01/09/2010)    HgBA1c: 8.1 (01/09/2010) Chol: 128 (09/06/2009)   HDL: 37 (09/06/2009)   LDL: 70 (09/06/2009)   TG: 106 (09/06/2009)  Complete Medication List: 1)  Clonidine Hcl 0.1 Mg Tabs (Clonidine hcl) .... One tab by mouth at bedtime 2)  Hydrocodone-acetaminophen 10-500 Mg Tabs (Hydrocodone-acetaminophen) .... One tab by mouth qid 3)  Nexium 40 Mg Cpdr (Esomeprazole magnesium) .... One cap by mouth once daily 4)  Furosemide 40 Mg Tabs (Furosemide) .... One tab by mouth once daily 5)  Zolpidem Tartrate 10 Mg Tabs (Zolpidem tartrate) .... One tab by mouth at bedtime 6)  Alprazolam 1 Mg Tabs (Alprazolam) .... One tab by mouth qid 7)  Tizanidine Hcl 4 Mg Tabs (Tizanidine hcl) .... One tab by mouth two times a day 8)  Potassium Chloride 20 Meq/32ml (10%) Liqd (Potassium chloride) .... 3 tsp by mouth two times a day 9)  Advair Diskus 250-50 Mcg/dose Misc (Fluticasone-salmeterol) .... Use one puff two times a day 10)  Bayer Breeze 2 Test Disk (Glucose blood) .... Uad 11)  Glipizide 10 Mg Tabs  (Glipizide) .... Take 1 tablet by mouth two times a day 12)  Bd Syringes  .... For use with lantus vial 13)  Xopenex Hfa 45 Mcg/act Aero (Levalbuterol tartrate) .... As needed 14)  Singulair 10 Mg Tabs (Montelukast sodium) .... Take 1 tablet by mouth once a day 15)  Duoneb 0.5-2.5 (3) Mg/78ml Soln (Ipratropium-albuterol) .... Take 1 tablet by mouth four times a day 16)  Crestor 20 Mg Tabs (Rosuvastatin calcium) .... Take one tab on monday and thursday 17)  Aspir-low 81 Mg Tbec (Aspirin) .... Take 1 tablet by mouth once a day 18)  Levothroid 100 Mcg Tabs (Levothyroxine sodium) .... Take 1 tablet by mouth once a day 19)  Lisinopril 20 Mg Tabs (Lisinopril) .... Take 1 tablet by mouth once a day 20)  Diabetic Shoes  .... With inserts x 1 pair 21)  Bayer Breeze 2 Test Disk (Glucose blood) .... Lancets and strips for four times a day testing 22)  Mobic 15 Mg Tabs (Meloxicam) .... Take 1 tablet by mouth once a day 23)  Vitamin D (ergocalciferol) 50000 Unit Caps (Ergocalciferol) .... One capsule once weekly 24)  Lantus 100 Unit/ml Soln (Insulin glargine) .... 80 units at bedtime 25)  Bd Insulin Syringe Ultrafine 31g X 5/16" 1 Ml Misc (Insulin syringe-needle u-100) .... Use as directed for once daily testing 26)  Tums 500 Mg Chew (Calcium carbonate antacid) .... As needed  Other Orders: T-Basic Metabolic Panel [id number]) T-Basic Metabolic Panel [id number]) T-Hepatic Function [id number]) T-Lipid Profile [id number]) T-TSH [id number]) T- Hemoglobin A1C [id number]) T-Vitamin D (25-Hydroxy) [id number])   Patient Instructions: 1)  F/u early december, cancel earlier appt. 2)  BMP prior to visit, ICD-9: 3)  Hepatic Panel prior to visit, ICD-9:   fasting early December 4)  Lipid Panel prior to visit, ICD-9: 5)  TSH prior to visit, ICD-9: 6)  HbgA1C prior to visit, ICD-9:, also vit d 7)  chem 7 today, dx high potassium 8)  Call for flu vaccine

## 2010-07-04 NOTE — Progress Notes (Signed)
  Phone Note Other Incoming   Summary of Call: Medicare denied claim for [employer], [facility] had been supplying this med. Advised [facility] that I do not have this documented in chart as an active med. Initial call taken by: Adella Hare LPN,  June 12, 2010 2:16 PM

## 2010-07-04 NOTE — Progress Notes (Signed)
Summary: STRIPS  Phone Note Call from Patient   Summary of Call: NEEDS HER STRIPS SENT TO HER PHARMACY CALL AND LET HER KNOW AND IF NOT THERE LEAVE MESSAGE WITH HUSBAND  LEFT MESSAGE Initial call taken by: Lind Guest,  April 10, 2010 10:17 AM  Follow-up for Phone Call        Phone Call Completed, Rx Called In Follow-up by: Adella Hare LPN,  April 10, 2010 10:20 AM    Prescriptions: BAYER BREEZE 2 TEST  DISK (GLUCOSE BLOOD) Lancets and strips for four times a day testing  #360 x 3   Entered by:   Adella Hare LPN   Authorized by:   Syliva Overman MD   Signed by:   Adella Hare LPN on 16/03/9603   Method used:   Electronically to        [facility] (retail)       [address]       [place], [place]  [address]       Ph: [phone]       Fax: [phone]   RxID:   [id number]

## 2010-07-04 NOTE — Letter (Signed)
Summary: progress notes  progress notes   Imported By: Curtis Sites 02/18/2010 14:55:09  _____________________________________________________________________  External Attachment:    Type:   Image     Comment:   External Document

## 2010-07-04 NOTE — Progress Notes (Signed)
Summary: medicines  Phone Note Call from Patient   Summary of Call: whata strength of potissum should she take.    [phone]   here potissum got to high.  Initial call taken by: Rudene Anda,  January 15, 2010 10:49 AM  Follow-up for Phone Call        discussed with patient, see append 01/11/10 Follow-up by: Adella Hare LPN,  January 15, 2010 11:00 AM

## 2010-07-04 NOTE — Progress Notes (Signed)
Summary: [name]  Phone Note Call from Patient   Summary of Call: [name]. pt states that [name] was denied. [phone] Initial call taken by: Rudene Anda,  Oct 15, 2009 11:24 AM  Follow-up for Phone Call        rx was sent, patient aware Follow-up by: Adella Hare LPN,  Oct 15, 2009 11:45 AM

## 2010-07-04 NOTE — Assessment & Plan Note (Signed)
Summary: OV   Vital Signs:  Patient profile:   60 year old female Menstrual status:  hysterectomy Height:      65 inches Weight:      297 pounds BMI:     49.60 O2 Sat:      92 % on 2 L/min Pulse rate:   100 / minute Pulse rhythm:   regular Resp:     16 per minute BP sitting:   140 / 88  (left arm) Cuff size:   xl  Vitals Entered By: Everitt Amber LPN (September 06, 1515 3:54 PM)  Nutrition Counseling: Patient's BMI is greater than 25 and therefore counseled on weight management options.  O2 Flow:  2 L/min CC: Follow up chronic problems, has an area on right lower leg that has broken out, has app at wound care center on the 14th for it   CC:  Follow up chronic problems, has an area on right lower leg that has broken out, and has app at wound care center on the 14th for it.  History of Present Illness: Reports  that she has been doing fairly well. She unfortunately still is not consistently testing her sugars. She reports ovewrall improvement in her mental health. Denies recent fever or chills. Denies sinus pressure, nasal congestion , ear pain or sore throat. Denies chest congestion, or cough productive of sputum.She still relies on supplemental oxygen continually. Denies chest pain, palpitations, PND, orthopnea or leg swelling.She cancelled her cardiology f/u stating that if she needed any intervention she did not eANT TO KNOW AS SHE DID NOT FEEL UP TO ANAESTHESIA.I encouraged her to rethink this approach. Denies abdominal pain, nausea, vomitting, diarrhea or constipation. Denies change in bowel movements or bloody stool. Denies dysuria , frequency, incontinence or hesitancy. Continues to have chronic back pain Denies headaches, vertigo, seizures. Denies depression, anxiety or insomnia. Denies  rash, lesions, or itch.     Preventive Screening-Counseling & Management  Alcohol-Tobacco     Smoking Cessation Counseling: yes  Current Medications (verified): 1)  Clonidine Hcl  0.1 Mg  Tabs (Clonidine Hcl) .... One Tab By Mouth At Bedtime 2)  Hydrocodone-Acetaminophen 10-500 Mg  Tabs (Hydrocodone-Acetaminophen) .... One Tab By Mouth Qid 3)  Nexium 40 Mg  Cpdr (Esomeprazole Magnesium) .... One Cap By Mouth Once Daily 4)  Furosemide 40 Mg  Tabs (Furosemide) .... One Tab By Mouth Once Daily 5)  Zolpidem Tartrate 10 Mg  Tabs (Zolpidem Tartrate) .... One Tab By Mouth At Bedtime 6)  Bupropion Hcl 100 Mg  Tabs (Bupropion Hcl) .... Four Tabs By Mouth Once Daily 7)  Alprazolam 1 Mg  Tabs (Alprazolam) .... One Tab By Mouth Qid 8)  Tizanidine Hcl 4 Mg  Tabs (Tizanidine Hcl) .... One Tab By Mouth Two Times A Day 9)  Potassium Chloride 20 Meq/24ml (10%) Liqd (Potassium Chloride) .... 3 Tsp By Mouth Bid 10)  Advair Diskus 250-50 Mcg/dose Misc (Fluticasone-Salmeterol) .... Use One Puff Two Times A Day 11)  Bayer Breeze 2 Test  Disk (Glucose Blood) .... Uad 12)  Glipizide 10 Mg Tabs (Glipizide) .... Take 1 Tablet By Mouth Two Times A Day 13)  Bd Syringes .... For Use With Lantus Vial 14)  Xopenex Hfa 45 Mcg/act Aero (Levalbuterol Tartrate) .... As Needed 15)  Singulair 10 Mg Tabs (Montelukast Sodium) .... Take 1 Tablet By Mouth Once A Day 16)  Duoneb 0.5-2.5 (3) Mg/108ml Soln (Ipratropium-Albuterol) .... Take 1 Tablet By Mouth Four Times A Day 17)  Lantus 100  Unit/ml Soln (Insulin Glargine) .... 60 Units Daily 18)  Crestor 20 Mg Tabs (Rosuvastatin Calcium) .... Take One Tab On Monday and Thursday 19)  Aspir-Low 81 Mg Tbec (Aspirin) .... Take 1 Tablet By Mouth Once A Day 20)  Levothroid 100 Mcg Tabs (Levothyroxine Sodium) .... Take 1 Tablet By Mouth Once A Day 21)  Lisinopril 20 Mg Tabs (Lisinopril) .... Take 1 Tablet By Mouth Once A Day 22)  Diabetic Shoes .... With Inserts X 1 Pair 23)  Bayer Breeze 2 Test  Disk (Glucose Blood) .... Lancets and Strips For Four Times A Day Testing 24)  Mobic 15 Mg Tabs (Meloxicam) .... Take 1 Tablet By Mouth Once A Day  Allergies (verified): 1)  !  Codeine  Review of Systems      See HPI General:  Complains of fatigue, sleep disorder, and weakness. Eyes:  Denies blurring and discharge. Derm:  Complains of lesion(s) and rash; 3 week h/o puritic rash on right leg , pt states it was draining, she used alcohol wash amdnd this has cleared she has PAd. Endo:  Complains of excessive thirst and excessive urination. Heme:  Denies abnormal bruising and bleeding. Allergy:  Complains of seasonal allergies.  Physical Exam  General:  Well-developed,,in no acute distress; alert,appropriate and cooperative throughout examination.obese HEENT: No facial asymmetry,  EOMI, No sinus tenderness, TM's Clear, oropharynx  pink and moist.   Chest: Clear to auscultation bilaterally. Decreased air enry throughout. CVS: S1, S2, No murmurs, No S3.   Abd: Soft, Nontender.  MS: decreased  ROM spine, hips, shoulders and knees.  Ext: 2 plus pitting  edema. Marked; CNS: CN 2-12 intact, power tone ly reduced dorsalis pdis bilaterally , right moreso than leftnd sensation normal throughout.   Skin: Intact, no visible lesions or rashes.  Psych: Good eye contact, normal affect.  Memory intact, not anxious or depressed appearing.   Diabetes Management Exam:    Foot Exam (with socks and/or shoes not present):       Sensory-Monofilament:          Left foot: diminished          Right foot: diminished       Inspection:          Left foot: normal          Right foot: normal       Nails:          Left foot: thickened          Right foot: thickened   Impression & Recommendations:  Problem # 1:  NICOTINE ADDICTION (ICD-305.1) Assessment Unchanged  Encouraged smoking cessation and discussed different methods for smoking cessation.   Problem # 2:  DIABETES MELLITUS, UNCONTROLLED (ICD-250.02) Assessment: Comment Only  The following medications were removed from the medication list:    Lantus 100 Unit/ml Soln (Insulin glargine) .Marland KitchenMarland KitchenMarland KitchenMarland Kitchen 60 units daily Her updated  medication list for this problem includes:    Glipizide 10 Mg Tabs (Glipizide) .Marland Kitchen... Take 1 tablet by mouth two times a day    Aspir-low 81 Mg Tbec (Aspirin) .Marland Kitchen... Take 1 tablet by mouth once a day    Lisinopril 20 Mg Tabs (Lisinopril) .Marland Kitchen... Take 1 tablet by mouth once a day    Lantus 100 Unit/ml Soln (Insulin glargine) .Marland KitchenMarland KitchenMarland KitchenMarland Kitchen 80 units at bedtime  Orders: T- Hemoglobin A1C ([id number])  Labs Reviewed: Creat: 1.02 (04/04/2009)    Reviewed HgBA1c results: 8.1 (02/21/2009)  8.2 (10/04/2008)  Problem # 3:  HYPERTENSION (ICD-401.9) Assessment: Unchanged  Her updated medication list for this problem includes:    Clonidine Hcl 0.1 Mg Tabs (Clonidine hcl) ..... One tab by mouth at bedtime    Furosemide 40 Mg Tabs (Furosemide) ..... One tab by mouth once daily    Lisinopril 20 Mg Tabs (Lisinopril) .Marland Kitchen... Take 1 tablet by mouth once a day  Orders: T-Basic Metabolic Panel ([id number])  BP today: 140/88, suboptimal, not at goal , dose increase anticipated esp in light of inc microalb Prior BP: 140/90 (05/23/2009)  Labs Reviewed: K+: 4.3 (04/04/2009) Creat: : 1.02 (04/04/2009)   Chol: 124 (02/20/2009)   HDL: 35 (02/20/2009)   LDL: 68 (02/20/2009)   TG: 106 (02/20/2009)  Problem # 4:  HYPOTHYROIDISM (ICD-244.9) Assessment: Comment Only  Her updated medication list for this problem includes:    Levothroid 100 Mcg Tabs (Levothyroxine sodium) .Marland Kitchen... Take 1 tablet by mouth once a day  Orders: T-TSH ([id number])  Labs Reviewed: TSH: 1.184 (02/20/2009)    HgBA1c: 8.1 (02/21/2009) Chol: 124 (02/20/2009)   HDL: 35 (02/20/2009)   LDL: 68 (02/20/2009)   TG: 106 (02/20/2009)  Problem # 5:  HYPERLIPIDEMIA (ICD-272.4) Assessment: Comment Only  Her updated medication list for this problem includes:    Crestor 20 Mg Tabs (Rosuvastatin calcium) .Marland Kitchen... Take one tab on monday and thursday  Orders: T-Hepatic Function [id number]) T-Lipid Profile [phone])  Labs Reviewed: SGOT: 14  (02/20/2009)   SGPT: 9 (02/20/2009)   HDL:35 (02/20/2009), 54 (09/22/2008)  LDL:68 (02/20/2009), 98 (09/22/2008)  Chol:124 (02/20/2009), 175 (09/22/2008)  Trig:106 (02/20/2009), 115 (09/22/2008)  Problem # 6:  INSOMNIA (ICD-780.52) Assessment: Improved  Her updated medication list for this problem includes:    Zolpidem Tartrate 10 Mg Tabs (Zolpidem tartrate) ..... One tab by mouth at bedtime  Discussed sleep hygiene.   Complete Medication List: 1)  Clonidine Hcl 0.1 Mg Tabs (Clonidine hcl) .... One tab by mouth at bedtime 2)  Hydrocodone-acetaminophen 10-500 Mg Tabs (Hydrocodone-acetaminophen) .... One tab by mouth qid 3)  Nexium 40 Mg Cpdr (Esomeprazole magnesium) .... One cap by mouth once daily 4)  Furosemide 40 Mg Tabs (Furosemide) .... One tab by mouth once daily 5)  Zolpidem Tartrate 10 Mg Tabs (Zolpidem tartrate) .... One tab by mouth at bedtime 6)  Bupropion Hcl 100 Mg Tabs (Bupropion hcl) .... Four tabs by mouth once daily 7)  Alprazolam 1 Mg Tabs (Alprazolam) .... One tab by mouth qid 8)  Tizanidine Hcl 4 Mg Tabs (Tizanidine hcl) .... One tab by mouth two times a day 9)  Potassium Chloride 20 Meq/9ml (10%) Liqd (Potassium chloride) .... 3 tsp by mouth bid 10)  Advair Diskus 250-50 Mcg/dose Misc (Fluticasone-salmeterol) .... Use one puff two times a day 11)  Bayer Breeze 2 Test Disk (Glucose blood) .... Uad 12)  Glipizide 10 Mg Tabs (Glipizide) .... Take 1 tablet by mouth two times a day 13)  Bd Syringes  .... For use with lantus vial 14)  Xopenex Hfa 45 Mcg/act Aero (Levalbuterol tartrate) .... As needed 15)  Singulair 10 Mg Tabs (Montelukast sodium) .... Take 1 tablet by mouth once a day 16)  Duoneb 0.5-2.5 (3) Mg/2ml Soln (Ipratropium-albuterol) .... Take 1 tablet by mouth four times a day 17)  Crestor 20 Mg Tabs (Rosuvastatin calcium) .... Take one tab on monday and thursday 18)  Aspir-low 81 Mg Tbec (Aspirin) .... Take 1 tablet by mouth once a day 19)  Levothroid 100 Mcg  Tabs (Levothyroxine sodium) .... Take 1 tablet by mouth once a day 20)  Lisinopril 20  Mg Tabs (Lisinopril) .... Take 1 tablet by mouth once a day 21)  Diabetic Shoes  .... With inserts x 1 pair 22)  Bayer Breeze 2 Test Disk (Glucose blood) .... Lancets and strips for four times a day testing 23)  Mobic 15 Mg Tabs (Meloxicam) .... Take 1 tablet by mouth once a day 24)  Vitamin D (ergocalciferol) 50000 Unit Caps (Ergocalciferol) .... One capsule once weekly 25)  Lantus 100 Unit/ml Soln (Insulin glargine) .... 80 units at bedtime  Other Orders: T-Vitamin D (25-Hydroxy) ([id number])  Patient Instructions: 1)  Please schedule a follow-up appointment in 3 months. 2)  Tobacco is very bad for your health and your loved ones! You Should stop smoking!. 3)  Stop Smoking Tips: Choose a Quit date. Cut down before the Quit date. decide what you will do as a substitute when you feel the urge to smoke(gum,toothpick,exercise). 4)  It is important that you exercise regularly at least 20 minutes 5 times a week. If you develop chest pain, have severe difficulty breathing, or feel very tired , stop exercising immediately and seek medical attention. 5)  You need to lose weight. Consider a lower calorie diet and regular exercise.labs today 6)  i will research the test for pAD if you have this documented , then i will prescribe it Prescriptions: LANTUS 100 UNIT/ML SOLN (INSULIN GLARGINE) 80 units at bedtime  #2400units x 3   Entered and Authorized by:   Syliva Overman MD   Signed by:   Syliva Overman MD on 09/09/2009   Method used:   Electronically to        [facility] (retail)       [address]       [place], [place]  [address]       Ph: [phone]       Fax: [phone]   RxID:   [id number] VITAMIN D (ERGOCALCIFEROL) 50000 UNIT CAPS (ERGOCALCIFEROL) one capsule once weekly  #4 x 4   Entered and Authorized by:   Syliva Overman MD   Signed by:   Syliva Overman MD on 09/09/2009   Method used:   Electronically to        [facility] (retail)       [address]       [place], [place]  [address]       Ph: [phone]       Fax: [phone]   RxID:   [id number]

## 2010-07-04 NOTE — Letter (Signed)
Summary: phone notes  phone notes   Imported By: Curtis Sites 02/18/2010 14:54:51  _____________________________________________________________________  External Attachment:    Type:   Image     Comment:   External Document

## 2010-07-04 NOTE — Letter (Signed)
Summary: med review sheet  med review sheet   Imported By: Rudene Anda 03/15/2010 16:53:43  _____________________________________________________________________  External Attachment:    Type:   Image     Comment:   External Document

## 2010-07-04 NOTE — Progress Notes (Signed)
Summary: ANTI. / DENTAL WORK  Phone Note Call from Patient   Summary of Call: WANTS TO KNOW DOES SHE NEED A ANTI. SHE HAS A DENTAL APPT TOMOROW AT 9:30  CALL BACK AT sister ann  [phone] or call donna niece  [phone] [facility] Initial call taken by: Lind Guest,  May 14, 2010 2:57 PM  Follow-up for Phone Call        no need for antibiotic Follow-up by: Syliva Overman MD,  May 15, 2010 6:44 AM  Additional Follow-up for Phone Call Additional follow up Details #1::        pt'ssister Dewayne Hatch given the msg, Nil's phone disconnected, she will tellher Additional Follow-up by: Syliva Overman MD,  May 15, 2010 7:08 AM

## 2010-07-04 NOTE — Letter (Signed)
Summary: medical release  medical release   Imported By: Lind Guest 05/30/2010 11:30:41  _____________________________________________________________________  External Attachment:    Type:   Image     Comment:   External Document

## 2010-07-04 NOTE — Progress Notes (Signed)
Summary: DR. Juanetta Gosling  DR. HAWKINS   Imported By: Lind Guest 11/13/2009 16:19:50  _____________________________________________________________________  External Attachment:    Type:   Image     Comment:   External Document

## 2010-07-04 NOTE — Progress Notes (Signed)
Summary: lancets and syr  Phone Note Call from Patient   Summary of Call: called and said that dr had changed her lancets and syringes and that [facility] needs a rx for this Initial call taken by: Lind Guest,  June 05, 2010 10:43 AM    New/Updated Medications: LANTUS 100 UNIT/ML SOLN (INSULIN GLARGINE) 20 units at bedtime Prescriptions: BD INSULIN SYRINGE ULTRAFINE 31G X 5/16" 1 ML MISC (INSULIN SYRINGE-NEEDLE U-100) use as directed for once daily testing  #100 x 2   Entered by:   Adella Hare LPN   Authorized by:   Syliva Overman MD   Signed by:   Adella Hare LPN on 16/03/9603   Method used:   Electronically to        [facility] (retail)       [address]       [place], [place]  [address]       Ph: [phone]       Fax: [phone]   RxID:   [id number] LANTUS 100 UNIT/ML SOLN (INSULIN GLARGINE) 20 units at bedtime  #600units x 2   Entered by:   Adella Hare LPN   Authorized by:   Syliva Overman MD   Signed by:   Adella Hare LPN on 03/29/2535   Method used:   Electronically to        [facility] (retail)       [address]       [place], [place]  [address]       Ph: [phone]       Fax: [phone]   RxID:   [id number]

## 2010-07-04 NOTE — Progress Notes (Signed)
Summary: results  Phone Note Call from Patient   Summary of Call: would l;ike to get results of lab [phone] Initial call taken by: Rudene Anda,  January 11, 2010 1:12 PM  Follow-up for Phone Call        pls advise blood sugar slightly better, give comparitive results but still has to imptove, her potassium slightly up, instead of 6 tewaspooons daily of potassium take 5, thyroid is good, no dose change, also she is slightly dehydrated inc watrer intake Follow-up by: Syliva Overman MD,  January 11, 2010 2:26 PM  Additional Follow-up for Phone Call Additional follow up Details #1::        patient aware Additional Follow-up by: Adella Hare LPN,  January 11, 2010 3:02 PM     Appended Document: results patient states she hasnt been taking as precribed, has only been taking 3 tsp a day, advised to cut back to one tsp two times a day  Appended Document: results please see previous append, is this okay, does patient needs follow up labs for potassium?  Appended Document: results pt does not need f/u lab for her potassium  Appended Document: results Called patient to let her know she does not need any f/u labs for her potassium. Husband said he would get her to call back. Will let her know at that time.

## 2010-07-04 NOTE — Assessment & Plan Note (Signed)
Summary: office visit   Vital Signs:  Patient profile:   60 year old female Menstrual status:  hysterectomy Height:      65 inches Weight:      301.75 pounds BMI:     50.40 O2 Sat:      95 % Pulse rate:   99 / minute Pulse rhythm:   regular Resp:     18 per minute BP sitting:   140 / 82  (left arm) Cuff size:   xl  Vitals Entered By: Everitt Amber LPN (January 09, 2010 2:27 PM)  Nutrition Counseling: Patient's BMI is greater than 25 and therefore counseled on weight management options. CC: Follow up chronic problems    CC:  Follow up chronic problems .  History of Present Illness: Reports  that she has been doing fairly well. Denies recent fever or chills. Denies sinus pressure, nasal congestion , ear pain or sore throat. She has chronic chest congestion, but denies  cough productive of sputum. Denies chest pain, palpitations or PND,she is unable to lie flat comfortably , she reports slight leg swelling at the end of the day. Denies abdominal pain, nausea, vomitting, diarrhea or constipation. Denies change in bowel movements or bloody stool. Denies dysuria , frequency, incontinence or hesitancy. Reports chronic  joint pain,and  reduced mobility. Denies headaches, vertigo, seizures. Her  depression and  anxiety are improved.She finds therapy very beneficial.  Denies  rash, lesions, or itch.     Preventive Screening-Counseling & Management  Alcohol-Tobacco     Smoking Cessation Counseling: yes  Current Medications (verified): 1)  Clonidine Hcl 0.1 Mg  Tabs (Clonidine Hcl) .... One Tab By Mouth At Bedtime 2)  Hydrocodone-Acetaminophen 10-500 Mg  Tabs (Hydrocodone-Acetaminophen) .... One Tab By Mouth Qid 3)  Nexium 40 Mg  Cpdr (Esomeprazole Magnesium) .... One Cap By Mouth Once Daily 4)  Furosemide 40 Mg  Tabs (Furosemide) .... One Tab By Mouth Once Daily 5)  Zolpidem Tartrate 10 Mg  Tabs (Zolpidem Tartrate) .... One Tab By Mouth At Bedtime 6)  Alprazolam 1 Mg  Tabs  (Alprazolam) .... One Tab By Mouth Qid 7)  Tizanidine Hcl 4 Mg  Tabs (Tizanidine Hcl) .... One Tab By Mouth Two Times A Day 8)  Potassium Chloride 20 Meq/25ml (10%) Liqd (Potassium Chloride) .... 3 Tsp By Mouth Bid 9)  Advair Diskus 250-50 Mcg/dose Misc (Fluticasone-Salmeterol) .... Use One Puff Two Times A Day 10)  Bayer Breeze 2 Test  Disk (Glucose Blood) .... Uad 11)  Glipizide 10 Mg Tabs (Glipizide) .... Take 1 Tablet By Mouth Two Times A Day 12)  Bd Syringes .... For Use With Lantus Vial 13)  Xopenex Hfa 45 Mcg/act Aero (Levalbuterol Tartrate) .... As Needed 14)  Singulair 10 Mg Tabs (Montelukast Sodium) .... Take 1 Tablet By Mouth Once A Day 15)  Duoneb 0.5-2.5 (3) Mg/66ml Soln (Ipratropium-Albuterol) .... Take 1 Tablet By Mouth Four Times A Day 16)  Crestor 20 Mg Tabs (Rosuvastatin Calcium) .... Take One Tab On Monday and Thursday 17)  Aspir-Low 81 Mg Tbec (Aspirin) .... Take 1 Tablet By Mouth Once A Day 18)  Levothroid 100 Mcg Tabs (Levothyroxine Sodium) .... Take 1 Tablet By Mouth Once A Day 19)  Lisinopril 20 Mg Tabs (Lisinopril) .... Take 1 Tablet By Mouth Once A Day 20)  Diabetic Shoes .... With Inserts X 1 Pair 21)  Bayer Breeze 2 Test  Disk (Glucose Blood) .... Lancets and Strips For Four Times A Day Testing 22)  Mobic  15 Mg Tabs (Meloxicam) .... Take 1 Tablet By Mouth Once A Day 23)  Vitamin D (Ergocalciferol) 50000 Unit Caps (Ergocalciferol) .... One Capsule Once Weekly 24)  Lantus 100 Unit/ml Soln (Insulin Glargine) .... 80 Units At Bedtime 25)  Bd Insulin Syringe Ultrafine 31g X 5/16" 1 Ml Misc (Insulin Syringe-Needle U-100) .... Use As Directed For Once Daily Testing  Allergies (verified): 1)  ! Codeine  Past History:  Past Surgical History: TAH, BSO 1978 Tonsillectomy- childhood Appendectomy (1978) Tracheotomy  secondary to fasiled attempt and lung biopsy bilateral great toenail removal  May 2011, complicated by MRSA in right toenail  Review of Systems      See  HPI General:  Complains of fatigue. Eyes:  Complains of vision loss-both eyes. GU:  Denies dysuria and urinary frequency. MS:  Complains of joint pain, low back pain, mid back pain, muscle weakness, and stiffness. Derm:  Denies itching, lesion(s), and rash. Neuro:  Denies seizures. Psych:  Complains of anxiety, depression, and mental problems; denies suicidal thoughts/plans, thoughts of violence, and unusual visions or sounds; improved with regular therapy. Endo:  Complains of excessive thirst and excessive urination; fasting blood sugars have been ranging from 99 to 200. Heme:  Denies abnormal bruising and bleeding. Allergy:  Denies hives or rash and itching eyes.  Physical Exam  General:  Well-developed obese,in no acute distress; alert,appropriate and cooperative throughout examinationPt has limited mobility, and is in a wheelchair HEENT: No facial asymmetry,  EOMI, No sinus tenderness, TM's Clear, oropharynx  pink and moist.   Chest: Clear to auscultation bilaterally. Decreased air entry throughout. CVS: S1, S2, No murmurs, No S3.   Abd: Soft, Nontender.  MS: decreased  ROM spine, hips, shoulders and knees.  Ext: No edema.   CNS: CN 2-12 intact, power and  tone within normal.reduced sensation in the feet   Skin: Intact, no visible lesions or rashes.  Psych: Good eye contact, normal affect.  Memory fair, not anxious or depressed appearing.    Impression & Recommendations:  Problem # 1:  FATIGUE (ICD-780.79) Assessment Unchanged  Orders: T-TSH (11914-78295)  Problem # 2:  NICOTINE ADDICTION (ICD-305.1) Assessment: Unchanged  Encouraged smoking cessation and discussed different methods for smoking cessation.   Problem # 3:  DIABETES MELLITUS, UNCONTROLLED (ICD-250.02) Assessment: Comment Only  Her updated medication list for this problem includes:    Glipizide 10 Mg Tabs (Glipizide) .Marland Kitchen... Take 1 tablet by mouth two times a day    Aspir-low 81 Mg Tbec (Aspirin) .Marland Kitchen...  Take 1 tablet by mouth once a day    Lisinopril 20 Mg Tabs (Lisinopril) .Marland Kitchen... Take 1 tablet by mouth once a day    Lantus 100 Unit/ml Soln (Insulin glargine) .Marland KitchenMarland KitchenMarland KitchenMarland Kitchen 80 units at bedtime  Orders: T- Hemoglobin A1C ([id number])  Labs Reviewed: Creat: 1.04 (09/06/2009)    Reviewed HgBA1c results: 8.4 (09/06/2009)  8.1 (02/21/2009)  Problem # 4:  HYPERTENSION (ICD-401.9) Assessment: Unchanged  Her updated medication list for this problem includes:    Clonidine Hcl 0.1 Mg Tabs (Clonidine hcl) ..... One tab by mouth at bedtime    Furosemide 40 Mg Tabs (Furosemide) ..... One tab by mouth once daily    Lisinopril 20 Mg Tabs (Lisinopril) .Marland Kitchen... Take 1 tablet by mouth once a day  Orders: T-Basic Metabolic Panel ([id number])  BP today: 140/82 Prior BP: 140/88 (09/05/2009)  Labs Reviewed: K+: 5.2 (09/06/2009) Creat: : 1.04 (09/06/2009)   Chol: 128 (09/06/2009)   HDL: 37 (09/06/2009)   LDL: 70 (09/06/2009)  TG: 106 (09/06/2009)  Problem # 5:  HYPOTHYROIDISM (ICD-244.9) Assessment: Comment Only  Her updated medication list for this problem includes:    Levothroid 100 Mcg Tabs (Levothyroxine sodium) .Marland Kitchen... Take 1 tablet by mouth once a day  Labs Reviewed: TSH: 1.446 (09/06/2009)    HgBA1c: 8.4 (09/06/2009) Chol: 128 (09/06/2009)   HDL: 37 (09/06/2009)   LDL: 70 (09/06/2009)   TG: 106 (09/06/2009)  Problem # 6:  DEPRESSION (ICD-311) Assessment: Improved  The following medications were removed from the medication list:    Bupropion Hcl 100 Mg Tabs (Bupropion hcl) .Marland Kitchen... Four tabs by mouth once daily Her updated medication list for this problem includes:    Alprazolam 1 Mg Tabs (Alprazolam) ..... One tab by mouth qid  Problem # 7:  HYPERLIPIDEMIA (ICD-272.4) Assessment: Comment Only  Her updated medication list for this problem includes:    Crestor 20 Mg Tabs (Rosuvastatin calcium) .Marland Kitchen... Take one tab on monday and thursday, low fat diet discussed and encouraged  Labs  Reviewed: SGOT: 13 (09/06/2009)   SGPT: 11 (09/06/2009)   HDL:37 (09/06/2009), 35 (02/20/2009)  LDL:70 (09/06/2009), 68 (02/20/2009)  Chol:128 (09/06/2009), 124 (02/20/2009)  Trig:106 (09/06/2009), 106 (02/20/2009)  Complete Medication List: 1)  Clonidine Hcl 0.1 Mg Tabs (Clonidine hcl) .... One tab by mouth at bedtime 2)  Hydrocodone-acetaminophen 10-500 Mg Tabs (Hydrocodone-acetaminophen) .... One tab by mouth qid 3)  Nexium 40 Mg Cpdr (Esomeprazole magnesium) .... One cap by mouth once daily 4)  Furosemide 40 Mg Tabs (Furosemide) .... One tab by mouth once daily 5)  Zolpidem Tartrate 10 Mg Tabs (Zolpidem tartrate) .... One tab by mouth at bedtime 6)  Alprazolam 1 Mg Tabs (Alprazolam) .... One tab by mouth qid 7)  Tizanidine Hcl 4 Mg Tabs (Tizanidine hcl) .... One tab by mouth two times a day 8)  Potassium Chloride 20 Meq/68ml (10%) Liqd (Potassium chloride) .... 3 tsp by mouth bid 9)  Advair Diskus 250-50 Mcg/dose Misc (Fluticasone-salmeterol) .... Use one puff two times a day 10)  Bayer Breeze 2 Test Disk (Glucose blood) .... Uad 11)  Glipizide 10 Mg Tabs (Glipizide) .... Take 1 tablet by mouth two times a day 12)  Bd Syringes  .... For use with lantus vial 13)  Xopenex Hfa 45 Mcg/act Aero (Levalbuterol tartrate) .... As needed 14)  Singulair 10 Mg Tabs (Montelukast sodium) .... Take 1 tablet by mouth once a day 15)  Duoneb 0.5-2.5 (3) Mg/28ml Soln (Ipratropium-albuterol) .... Take 1 tablet by mouth four times a day 16)  Crestor 20 Mg Tabs (Rosuvastatin calcium) .... Take one tab on monday and thursday 17)  Aspir-low 81 Mg Tbec (Aspirin) .... Take 1 tablet by mouth once a day 18)  Levothroid 100 Mcg Tabs (Levothyroxine sodium) .... Take 1 tablet by mouth once a day 19)  Lisinopril 20 Mg Tabs (Lisinopril) .... Take 1 tablet by mouth once a day 20)  Diabetic Shoes  .... With inserts x 1 pair 21)  Bayer Breeze 2 Test Disk (Glucose blood) .... Lancets and strips for four times a day  testing 22)  Mobic 15 Mg Tabs (Meloxicam) .... Take 1 tablet by mouth once a day 23)  Vitamin D (ergocalciferol) 50000 Unit Caps (Ergocalciferol) .... One capsule once weekly 24)  Lantus 100 Unit/ml Soln (Insulin glargine) .... 80 units at bedtime 25)  Bd Insulin Syringe Ultrafine 31g X 5/16" 1 Ml Misc (Insulin syringe-needle u-100) .... Use as directed for once daily testing  Patient Instructions: 1)  Please schedule a  follow-up appointment in 2 months. 2)  It is important that you exercise regularly at least 20 minutes 5 times a week. If you develop chest pain, have severe difficulty breathing, or feel very tired , stop exercising immediately and seek medical attention. 3)  You need to lose weight. Consider a lower calorie diet and regular exercise.  4)  BMP prior to visit, ICD-9:   today 5)  HbgA1C prior to visit, ICD-9: 6)  tsh 7)  Tobacco is very bad for your health and your loved ones! You Should stop smoking!. 8)  Stop Smoking Tips: Choose a Quit date. Cut down before the Quit date. decide what you will do as a substitute when you feel the urge to smoke(gum,toothpick,exercise). Prescriptions: VITAMIN D (ERGOCALCIFEROL) 50000 UNIT CAPS (ERGOCALCIFEROL) one capsule once weekly  #4 x 4   Entered by:   Everitt Amber LPN   Authorized by:   Syliva Overman MD   Signed by:   Everitt Amber LPN on 16/03/9603   Method used:   Electronically to        [facility] (retail)       [address]       [place], [place]  [address]       Ph: [phone]       Fax: [phone]   RxID:   [id number] LISINOPRIL 20 MG TABS (LISINOPRIL) Take 1 tablet by mouth once a day  #30 Tablet x 3   Entered by:   Everitt Amber LPN   Authorized by:   Syliva Overman MD   Signed by:   Everitt Amber LPN on 03/29/2535   Method used:   Electronically to        [facility] (retail)       [address]       [place], [place]  [address]       Ph:  [phone]       Fax: [phone]   RxID:   [id number] LEVOTHROID 100 MCG TABS (LEVOTHYROXINE SODIUM) Take 1 tablet by mouth once a day  #30 Tablet x 3   Entered by:   Everitt Amber LPN   Authorized by:   Syliva Overman MD   Signed by:   Everitt Amber LPN on 32/35/5732   Method used:   Electronically to        [facility] (retail)       [address]       [place], [place]  [address]       Ph: [phone]       Fax: [phone]   RxID:   [id number] SINGULAIR 10 MG TABS (MONTELUKAST SODIUM) Take 1 tablet by mouth once a day  #30 Tablet x 3   Entered by:   Everitt Amber LPN   Authorized by:   Syliva Overman MD   Signed by:   Everitt Amber LPN on 35/00/9381   Method used:   Electronically to        [facility] (retail)       [address]       [place], [place]  [address]       Ph: [phone]       Fax: [phone]   RxID:   [id number] GLIPIZIDE 10 MG TABS (GLIPIZIDE)  Take 1 tablet by mouth two times a day  #60 Tablet x 3   Entered by:   Everitt Amber LPN   Authorized by:   Syliva Overman MD   Signed by:   Everitt Amber LPN on 16/03/9603   Method used:   Electronically to        [facility] (retail)       [address]       [place], [place]  [address]       Ph: [phone]       Fax: [phone]   RxID:   [id number] TIZANIDINE HCL 4 MG  TABS (TIZANIDINE HCL) one tab by mouth two times a day  #60 Tablet x 3   Entered by:   Everitt Amber LPN   Authorized by:   Syliva Overman MD   Signed by:   Everitt Amber LPN on 03/29/2535   Method used:   Electronically to        [facility] (retail)       [address]       [place], [place]  [address]       Ph: [phone]       Fax: [phone]   RxID:   [id number] CLONIDINE HCL 0.1 MG  TABS (CLONIDINE HCL) one tab by mouth at bedtime  #30 Tablet x 3   Entered by:   Everitt Amber LPN    Authorized by:   Syliva Overman MD   Signed by:   Everitt Amber LPN on 32/35/5732   Method used:   Electronically to        [facility] (retail)       [address]       [place], [place]  [address]       Ph: [phone]       Fax: [phone]   RxID:   [id number]

## 2010-07-04 NOTE — Letter (Signed)
Summary: dose change  dose change   Imported By: Lind Guest 05/08/2010 10:48:31  _____________________________________________________________________  External Attachment:    Type:   Image     Comment:   External Document

## 2010-07-04 NOTE — Letter (Signed)
Summary: history and physical  history and physical   Imported By: Lind Guest 10/15/2009 13:54:14  _____________________________________________________________________  External Attachment:    Type:   Image     Comment:   External Document

## 2010-07-04 NOTE — Letter (Signed)
Summary: x rays  x rays   Imported By: Lind Guest 10/15/2009 13:55:50  _____________________________________________________________________  External Attachment:    Type:   Image     Comment:   External Document

## 2010-07-04 NOTE — Letter (Signed)
Summary: meter & supplies  meter & supplies   Imported By: Lind Guest 04/12/2010 10:25:52  _____________________________________________________________________  External Attachment:    Type:   Image     Comment:   External Document

## 2010-07-04 NOTE — Letter (Signed)
Summary: RX AUDIT  RX AUDIT   Imported By: Lind Guest 06/28/2009 11:17:05  _____________________________________________________________________  External Attachment:    Type:   Image     Comment:   External Document

## 2010-07-04 NOTE — Letter (Signed)
Summary: misc  misc   Imported By: Lind Guest 10/15/2009 14:16:41  _____________________________________________________________________  External Attachment:    Type:   Image     Comment:   External Document

## 2010-07-04 NOTE — Assessment & Plan Note (Signed)
Summary: F UP   Vital Signs:  Patient profile:   60 year old female Menstrual status:  hysterectomy Height:      65 inches Weight:      282.25 pounds BMI:     47.14 O2 Sat:      98 % on 2 L/min Pulse rate:   93 / minute Pulse rhythm:   regular Resp:     16 per minute BP sitting:   110 / 70  (left arm)  Vitals Entered By: Adella Hare LPN (May 07, 2010 3:35 PM)  Nutrition Counseling: Patient's BMI is greater than 25 and therefore counseled on weight management options.  O2 Flow:  2 L/min CC: follow-up visit Is Patient Diabetic? Yes   CC:  follow-up visit.  History of Present Illness: Reports  that she is doing "about the same". no new concerns. Blood sugars continue to be uncontrolled. depression unchanged and itherapyChronic generalised joint pain and stiffness, anbulates with assistance Denies recent fever or chills. Denies sinus pressure, nasal congestion , ear pain or sore throat. Denies chest congestion, or cough productive of sputum.chronic smoker's cough, smoking habit unchanged, oxygen dependent Denies chest pain, palpitations, PND,  or leg swelling. Denies abdominal pain, nausea, vomitting, diarrhea or constipation. Denies change in bowel movements or bloody stool. Denies dysuria , frequency, incontinence or hesitancy.  Denies headaches, vertigo, seizures.  Denies  rash, lesions, or itch.     Preventive Screening-Counseling & Management  Alcohol-Tobacco     Smoking Cessation Counseling: yes  Current Medications (verified): 1)  Clonidine Hcl 0.1 Mg  Tabs (Clonidine Hcl) .... One Tab By Mouth At Bedtime 2)  Hydrocodone-Acetaminophen 10-500 Mg  Tabs (Hydrocodone-Acetaminophen) .... One Tab By Mouth Four Times Daily 3)  Nexium 40 Mg  Cpdr (Esomeprazole Magnesium) .... One Cap By Mouth Once Daily 4)  Furosemide 40 Mg  Tabs (Furosemide) .... One Tab By Mouth Once Daily 5)  Zolpidem Tartrate 10 Mg  Tabs (Zolpidem Tartrate) .... One Tab By Mouth At  Bedtime 6)  Alprazolam 1 Mg  Tabs (Alprazolam) .... One Tab By Mouth Qid 7)  Tizanidine Hcl 4 Mg  Tabs (Tizanidine Hcl) .... One Tab By Mouth Two Times A Day 8)  Potassium Chloride 20 Meq/36ml (10%) Liqd (Potassium Chloride) .... 7.59ml By Mouth Once Daily 9)  Advair Diskus 250-50 Mcg/dose Misc (Fluticasone-Salmeterol) .... Use One Puff Two Times A Day 10)  Bayer Breeze 2 Test  Disk (Glucose Blood) .... Uad 11)  Glipizide 10 Mg Tabs (Glipizide) .... Take 1 Tablet By Mouth Two Times A Day 12)  Bd Syringes .... For Use With Lantus Vial 13)  Xopenex Hfa 45 Mcg/act Aero (Levalbuterol Tartrate) .... As Needed 14)  Singulair 10 Mg Tabs (Montelukast Sodium) .... Take 1 Tablet By Mouth Once A Day 15)  Duoneb 0.5-2.5 (3) Mg/29ml Soln (Ipratropium-Albuterol) .... Use One Vial Per Nebulizer Four Times Daily 16)  Crestor 20 Mg Tabs (Rosuvastatin Calcium) .... Take One Tab On Monday and Thursday 17)  Aspir-Low 81 Mg Tbec (Aspirin) .... Take 1 Tablet By Mouth Once A Day 18)  Levothroid 100 Mcg Tabs (Levothyroxine Sodium) .... Take 1 Tablet By Mouth Once A Day 19)  Lisinopril 20 Mg Tabs (Lisinopril) .... Take 1 Tablet By Mouth Once A Day 20)  Diabetic Shoes .... With Inserts X 1 Pair 21)  Bayer Breeze 2 Test  Disk (Glucose Blood) .... Lancets and Strips For Four Times A Day Testing 22)  Mobic 15 Mg Tabs (Meloxicam) .... Take  1 Tablet By Mouth Once A Day 23)  Vitamin D (Ergocalciferol) 50000 Unit Caps (Ergocalciferol) .... One Capsule Once Weekly 24)  Lantus 100 Unit/ml Soln (Insulin Glargine) .... 80 Units At Bedtime 25)  Bd Insulin Syringe Ultrafine 31g X 5/16" 1 Ml Misc (Insulin Syringe-Needle U-100) .... Use As Directed For Once Daily Testing 26)  Tums 500 Mg Chew (Calcium Carbonate Antacid) .... As Needed 27)  Guaifenesin 100 Mg/16ml Syrp (Guaifenesin) .Marland KitchenMarland KitchenMarland Kitchen 15ml By Mouth Four Times Daily 28)  Avelox Abc Pack 400 Mg Tabs (Moxifloxacin Hcl) .... Oen Tab By Mouth Once Daily  Allergies (verified): 1)  !  Codeine  Review of Systems      See HPI General:  Complains of fatigue and sleep disorder. Eyes:  Complains of vision loss-both eyes; denies eye pain and red eye. Resp:  Complains of cough and shortness of breath; denies sputum productive. MS:  Complains of joint pain, low back pain, mid back pain, and stiffness. Neuro:  Complains of numbness, poor balance, and weakness. Psych:  Complains of anxiety, easily angered, and mental problems; denies suicidal thoughts/plans, thoughts of violence, and thoughts /plans of harming others. Endo:  Complains of excessive thirst and excessive urination; fluctuating elevated blood sugars. Heme:  Denies abnormal bruising and bleeding. Allergy:  Denies hives or rash and itching eyes.  Physical Exam  General:  Well-developed obese,in no acute distress; alert,appropriate and cooperative throughout examination. HEENT: No facial asymmetry,  EOMI, No sinus tenderness, TM's Clear, oropharynx  pink and moist.   Chest: Clear to auscultation bilaterally. Decreased air entry throughout. CVS: S1, S2, No murmurs, No S3.   Abd: Soft, Nontender.  MS: decreased  ROM spine, hips, shoulders and knees.  Ext: No edema.   CNS: CN 2-12 intact, power and  tone within .reduced sensation in the feet   Skin: Intact, no visible lesions or rashes.  Psych: Good eye contact, normal affect.  Memory fair, not anxious or depressed appearing.    Impression & Recommendations:  Problem # 1:  NICOTINE ADDICTION (ICD-305.1) Assessment Unchanged  Encouraged smoking cessation and discussed different methods for smoking cessation.   Problem # 2:  DIABETES MELLITUS, UNCONTROLLED (ICD-250.02) Assessment: Deteriorated  The following medications were removed from the medication list:    Lantus 100 Unit/ml Soln (Insulin glargine) .Marland KitchenMarland KitchenMarland KitchenMarland Kitchen 80 units at bedtime Her updated medication list for this problem includes:    Glipizide 10 Mg Tabs (Glipizide) .Marland Kitchen... Take 1 tablet by mouth two times a  day    Aspir-low 81 Mg Tbec (Aspirin) .Marland Kitchen... Take 1 tablet by mouth once a day    Lisinopril 20 Mg Tabs (Lisinopril) .Marland Kitchen... Take 1 tablet by mouth once a day    Lantus Solostar 100 Unit/ml Soln (Insulin glargine) .Marland Kitchen... 20 units at bedtime    Glipizide 5 Mg Tabs (Glipizide) .Marland Kitchen... Take 1 tablet by mouth two times a day Patient advised to reduce carbs and sweets, commit to regular physical activity, take meds as prescribed, test blood sugars as directed, and attempt to lose weight , to improve blood sugar control.  Orders: T- Hemoglobin A1C ([id number]) T-Urine Microalbumin w/creat. ratio [phone])  Labs Reviewed: Creat: 1.10 (05/04/2010)    Reviewed HgBA1c results: 9.6 (05/04/2010)  8.1 (01/09/2010)  Problem # 3:  HYPERTENSION (ICD-401.9) Assessment: Improved  Her updated medication list for this problem includes:    Clonidine Hcl 0.1 Mg Tabs (Clonidine hcl) ..... One tab by mouth at bedtime    Furosemide 40 Mg Tabs (Furosemide) ..... One tab by mouth  once daily    Lisinopril 20 Mg Tabs (Lisinopril) .Marland Kitchen... Take 1 tablet by mouth once a day  BP today: 110/70 Prior BP: 130/80 (03/14/2010)  Labs Reviewed: K+: 3.7 (05/04/2010) Creat: : 1.10 (05/04/2010)   Chol: 168 (05/04/2010)   HDL: 41 (05/04/2010)   LDL: 108 (05/04/2010)   TG: 94 (05/04/2010)  Problem # 4:  DEPRESSION (ICD-311) Assessment: Unchanged  Her updated medication list for this problem includes:    Alprazolam 1 Mg Tabs (Alprazolam) ..... One tab by mouth qid seeing therapist monthly which she states helps her alot  Problem # 5:  OBESITY (ICD-278.00) Assessment: Unchanged  Ht: 65 (05/07/2010)   Wt: 282.25 (05/07/2010)   BMI: 47.14 (05/07/2010) therapeutic lifestyle change discussed and encouraged  Problem # 6:  HYPOTHYROIDISM (ICD-244.9) Assessment: Unchanged  Her updated medication list for this problem includes:    Levothroid 100 Mcg Tabs (Levothyroxine sodium) .Marland Kitchen... Take 1 tablet by mouth once a  day  Labs Reviewed: TSH: 4.540 (05/04/2010)    HgBA1c: 9.6 (05/04/2010) Chol: 168 (05/04/2010)   HDL: 41 (05/04/2010)   LDL: 108 (05/04/2010)   TG: 94 (05/04/2010)  Complete Medication List: 1)  Clonidine Hcl 0.1 Mg Tabs (Clonidine hcl) .... One tab by mouth at bedtime 2)  Hydrocodone-acetaminophen 10-500 Mg Tabs (Hydrocodone-acetaminophen) .... One tab by mouth four times daily 3)  Nexium 40 Mg Cpdr (Esomeprazole magnesium) .... One cap by mouth once daily 4)  Furosemide 40 Mg Tabs (Furosemide) .... One tab by mouth once daily 5)  Zolpidem Tartrate 10 Mg Tabs (Zolpidem tartrate) .... One tab by mouth at bedtime 6)  Alprazolam 1 Mg Tabs (Alprazolam) .... One tab by mouth qid 7)  Tizanidine Hcl 4 Mg Tabs (Tizanidine hcl) .... One tab by mouth two times a day 8)  Potassium Chloride 20 Meq/87ml (10%) Liqd (Potassium chloride) .... 7.45ml by mouth once daily 9)  Advair Diskus 250-50 Mcg/dose Misc (Fluticasone-salmeterol) .... Use one puff two times a day 10)  Bayer Breeze 2 Test Disk (Glucose blood) .... Uad 11)  Glipizide 10 Mg Tabs (Glipizide) .... Take 1 tablet by mouth two times a day 12)  Bd Syringes  .... For use with lantus vial 13)  Xopenex Hfa 45 Mcg/act Aero (Levalbuterol tartrate) .... As needed 14)  Singulair 10 Mg Tabs (Montelukast sodium) .... Take 1 tablet by mouth once a day 15)  Duoneb 0.5-2.5 (3) Mg/82ml Soln (Ipratropium-albuterol) .... Use one vial per nebulizer four times daily 16)  Aspir-low 81 Mg Tbec (Aspirin) .... Take 1 tablet by mouth once a day 17)  Levothroid 100 Mcg Tabs (Levothyroxine sodium) .... Take 1 tablet by mouth once a day 18)  Lisinopril 20 Mg Tabs (Lisinopril) .... Take 1 tablet by mouth once a day 19)  Diabetic Shoes  .... With inserts x 1 pair 20)  Bayer Breeze 2 Test Disk (Glucose blood) .... Lancets and strips for four times a day testing 21)  Mobic 15 Mg Tabs (Meloxicam) .... Take 1 tablet by mouth once a day 22)  Bd Insulin Syringe Ultrafine 31g X  5/16" 1 Ml Misc (Insulin syringe-needle u-100) .... Use as directed for once daily testing 23)  Tums 500 Mg Chew (Calcium carbonate antacid) .... As needed 24)  Guaifenesin 100 Mg/6ml Syrp (Guaifenesin) .Marland KitchenMarland KitchenMarland Kitchen 15ml by mouth four times daily 25)  Avelox Abc Pack 400 Mg Tabs (Moxifloxacin hcl) .... Oen tab by mouth once daily 26)  Lantus Solostar 100 Unit/ml Soln (Insulin glargine) .... 20 units at bedtime 27)  Glipizide 5 Mg Tabs (Glipizide) .... Take 1 tablet by mouth two times a day 28)  Crestor 20 Mg Tabs (Rosuvastatin calcium) .... One tablet every monday, wednesday an friday night  Other Orders: Medicare Electronic Prescription [phone])  Patient Instructions: 1)  Please schedule a follow-up appointment in 3 months. 2)  Tobacco is very bad for your health and your loved ones! You Should stop smoking!. 3)  Stop Smoking Tips: Choose a Quit date. Cut down before the Quit date. decide what you will do as a substitute when you feel the urge to smoke(gum,toothpick,exercise). 4)  It is important that you exercise regularly at least 20 minutes 5 times a week. If you develop chest pain, have severe difficulty breathing, or feel very tired , stop exercising immediately and seek medical attention. 5)  You need to lose weight. Consider a lower calorie diet and regular exercise.  6)  HbgA1C prior to visit, ICD-9: 7)  send microalb today 8)  Change in doses of diabetic meds as discussed. 9)  pls inc veg intake and decrease your carbs 10)  Pls stop vit D Prescriptions: CRESTOR 20 MG TABS (ROSUVASTATIN CALCIUM) one tablet every monday, wednesday an Friday night  #12 x 3   Entered and Authorized by:   Syliva Overman MD   Signed by:   Syliva Overman MD on 05/07/2010   Method used:   Printed then faxed [facility] ...       [facility] [address]       [address]       [place], [place]  [address]       Ph: [phone]       Fax: [phone]   RxID:    [id number] GLIPIZIDE 5 MG TABS (GLIPIZIDE) Take 1 tablet by mouth two times a day  #60 x 3   Entered and Authorized by:   Syliva Overman MD   Signed by:   Syliva Overman MD on 05/07/2010   Method used:   Printed then faxed to ...       [facility]  [address] (retail)       [address]       [place], [place]  [address]       Ph: [phone]       Fax: [phone]   RxID:   [id number] LANTUS SOLOSTAR 100 UNIT/ML SOLN (INSULIN GLARGINE) 20 units at bedtime  #600 units x 3   Entered and Authorized by:   Syliva Overman MD   Signed by:   Syliva Overman MD on 05/07/2010   Method used:   Printed then faxed to ...       [facility]  [address] (retail)       [address]       [place], [place]  [address]       Ph: [phone]       Fax: [phone]   RxID:   [id number]    Orders Added: 1)  Est. Patient Level IV [00938] 2)  Medicare Electronic Prescription [G8553] 3)  T- Hemoglobin A1C [83036-23375] 4)  T-Urine Microalbumin w/creat. ratio [[id number]]

## 2010-07-04 NOTE — Letter (Signed)
Summary: PHONE NOTES  PHONE NOTES   Imported By: Lind Guest 10/15/2009 14:08:19  _____________________________________________________________________  External Attachment:    Type:   Image     Comment:   External Document

## 2010-07-04 NOTE — Progress Notes (Signed)
Summary: medicine  Phone Note Call from Patient   Summary of Call: went to the pharmacy 4 times yesterday to get her medicines and no one from here has responded from the [facility] from [facility] and plus she had called here also  Initial call taken by: Lind Guest,  April 05, 2010 8:22 AM  Follow-up for Phone Call        pLS pLS refill all meds the pt needs, pLS call the pharmacy also the pt , thanks Follow-up by: Syliva Overman MD,  April 05, 2010 8:27 AM  Additional Follow-up for Phone Call Additional follow up Details #1::        i have refilled these meds more than once in the past two days, im not sure what she is talking about but i will call the pharmacy Additional Follow-up by: Adella Hare LPN,  April 05, 2010 9:08 AM    Additional Follow-up for Phone Call Additional follow up Details #2::    spoke with pharmacy and they have all but one med and that was the pain med that we didnt recieve a request for, gave verbal order Follow-up by: Adella Hare LPN,  April 05, 2010 9:11 AM  Additional Follow-up for Phone Call Additional follow up Details #3:: Details for Additional Follow-up Action Taken: thanks  let pt know also if possible when done. Additional Follow-up by: Syliva Overman MD,  April 05, 2010 12:34 PM

## 2010-07-04 NOTE — Letter (Signed)
Summary: consults chart 1  consults chart 1   Imported By: Curtis Sites 02/18/2010 14:53:55  _____________________________________________________________________  External Attachment:    Type:   Image     Comment:   External Document

## 2010-07-04 NOTE — Letter (Signed)
Summary: demographic chart 1  demographic chart 1   Imported By: Curtis Sites 02/18/2010 14:54:14  _____________________________________________________________________  External Attachment:    Type:   Image     Comment:   External Document

## 2010-07-04 NOTE — Progress Notes (Signed)
  Phone Note Other Incoming   Caller: dr Cledith Kamiya Summary of Call: pls print a copy of [name]'s last ov and semd to [employer]  with signed form in the box up front, after [name] gives signed consent to snd her info there Initial call taken by: Syliva Overman MD,  May 02, 2010 6:12 PM  Follow-up for Phone Call        # is disconnected she has appt. 12.6.11 Follow-up by: Lind Guest,  May 03, 2010 3:16 PM  Additional Follow-up for Phone Call Additional follow up Details #1::        HAD [name] TO SIGN AND MAILED THE PAPER TODAY Additional Follow-up by: Lind Guest,  May 07, 2010 3:22 PM

## 2010-07-04 NOTE — Letter (Signed)
Summary: consults  consults   Imported By: Lind Guest 10/15/2009 13:55:08  _____________________________________________________________________  External Attachment:    Type:   Image     Comment:   External Document

## 2010-07-04 NOTE — Progress Notes (Signed)
  Phone Note Call from Patient   Summary of Call: Requesting refill on Xopenex. Do we fill or [place]? Ok to fill? [facility] Initial call taken by: Everitt Amber LPN,  November 06, 5407 4:27 PM  Follow-up for Phone Call        dr Juanetta Gosling should since he follows [facility], pls fax a request note to his office to the attention of his nurse and call her to let them know also, if they have not routinely been filling Follow-up by: Syliva Overman MD,  November 05, 2009 5:09 PM  Additional Follow-up for Phone Call Additional follow up Details #1::        [facility] to [facility] Additional Follow-up by: Everitt Amber LPN,  November 06, 8117 12:53 PM

## 2010-07-04 NOTE — Letter (Signed)
Summary: Product manager   Imported By: Lind Guest 05/07/2010 16:52:59  _____________________________________________________________________  External Attachment:    Type:   Image     Comment:   External Document

## 2010-07-04 NOTE — Letter (Signed)
Summary: labs  labs   Imported By: Lind Guest 10/15/2009 13:52:35  _____________________________________________________________________  External Attachment:    Type:   Image     Comment:   External Document

## 2010-07-04 NOTE — Progress Notes (Signed)
Summary: rx for vit d  Phone Note Call from Patient   Summary of Call: left message about her vit. d rx she has no money for 2 weeks and wants to know do you have samples call back at [phone] Initial call taken by: Lind Guest,  September 14, 2009 7:47 AM  Follow-up for Phone Call        she wanted you to know that she couldn't get her vitamin D for another 2 weeks and wanted to make sure it was ok since it was so low (10). She said if it was urgent she could borrow the money from family but she doesn't like doing that. I told her I would ask and call her back Follow-up by: Everitt Amber LPN,  September 14, 2009 8:07 AM  Additional Follow-up for Phone Call Additional follow up Details #1::        OK to wait 2 weeks until she has the money to pick up prescription. Additional Follow-up by: Esperanza Sheets PA,  September 14, 2009 10:58 AM    Additional Follow-up for Phone Call Additional follow up Details #2::    Patient aware Follow-up by: Everitt Amber LPN,  September 18, 2009 12:43 PM

## 2010-07-04 NOTE — Progress Notes (Signed)
Summary: [facility]  Phone Note Other Incoming   Summary of Call: lady from [place] called in regarding a fax she sent over on the 22nd for patient's diabetic supplies, I didn't see anything other then the 11th.  She is going to refax. Initial call taken by: Curtis Sites,  April 30, 2010 4:28 PM  Follow-up for Phone Call        noted and will be looking for fax Follow-up by: Adella Hare LPN,  May 01, 2010 4:59 PM

## 2010-07-04 NOTE — Progress Notes (Signed)
Summary: test strips  Phone Note Call from Patient   Summary of Call: She needs her strips called into [facility] there fax number is [phone], this is for a 3 month supply.   Initial call taken by: Curtis Sites,  April 11, 2010 10:19 AM  Follow-up for Phone Call        patient has called and left message to please send this today  Follow-up by: Lind Guest,  April 11, 2010 4:58 PM  Additional Follow-up for Phone Call Additional follow up Details #1::        pls send in test strips for three times  daily testing for this pt, 3 month supply with 3 refills and let her know, dx is iDDM Additional Follow-up by: Syliva Overman MD,  April 12, 2010 5:09 AM    Additional Follow-up for Phone Call Additional follow up Details #2::    PATIENT IS OUT OF STRIPS   Follow-up by: Lind Guest,  April 12, 2010 9:23 AM  Additional Follow-up for Phone Call Additional follow up Details #3:: Details for Additional Follow-up Action Taken: patient is aware that jamie has send it off Additional Follow-up by: Lind Guest,  April 12, 2010 9:50 AM

## 2010-07-05 NOTE — Letter (Signed)
Summary: office notes  office notes   Imported By: Lind Guest 10/15/2009 13:53:21  _____________________________________________________________________  External Attachment:    Type:   Image     Comment:   External Document

## 2010-07-05 NOTE — Letter (Signed)
Summary: demo  demo   Imported By: Lind Guest 10/15/2009 13:50:57  _____________________________________________________________________  External Attachment:    Type:   Image     Comment:   External Document

## 2010-07-19 ENCOUNTER — Encounter (HOSPITAL_COMMUNITY): Payer: Self-pay | Admitting: Psychology

## 2010-07-24 ENCOUNTER — Encounter (HOSPITAL_COMMUNITY): Payer: Self-pay | Admitting: Psychology

## 2010-07-31 ENCOUNTER — Encounter (INDEPENDENT_AMBULATORY_CARE_PROVIDER_SITE_OTHER): Payer: Medicare Other | Admitting: Psychology

## 2010-07-31 DIAGNOSIS — F4001 Agoraphobia with panic disorder: Secondary | ICD-10-CM

## 2010-07-31 DIAGNOSIS — F332 Major depressive disorder, recurrent severe without psychotic features: Secondary | ICD-10-CM

## 2010-08-01 ENCOUNTER — Telehealth: Payer: Self-pay | Admitting: Family Medicine

## 2010-08-05 ENCOUNTER — Encounter: Payer: Self-pay | Admitting: Family Medicine

## 2010-08-05 ENCOUNTER — Other Ambulatory Visit: Payer: Self-pay | Admitting: Family Medicine

## 2010-08-05 LAB — CBC WITH DIFFERENTIAL/PLATELET
Basophils Absolute: 0 10*3/uL (ref 0.0–0.1)
Basophils Relative: 1 % (ref 0–1)
Eosinophils Absolute: 0.5 10*3/uL (ref 0.0–0.7)
Hemoglobin: 12.5 g/dL (ref 12.0–15.0)
MCH: 29.4 pg (ref 26.0–34.0)
MCHC: 30.7 g/dL (ref 30.0–36.0)
Neutro Abs: 4.6 10*3/uL (ref 1.7–7.7)
Neutrophils Relative %: 59 % (ref 43–77)
Platelets: 279 10*3/uL (ref 150–400)
RDW: 13.5 % (ref 11.5–15.5)

## 2010-08-05 LAB — BASIC METABOLIC PANEL
Calcium: 8.7 mg/dL (ref 8.4–10.5)
Potassium: 4.5 mEq/L (ref 3.5–5.3)
Sodium: 140 mEq/L (ref 135–145)

## 2010-08-05 LAB — HEMOGLOBIN A1C: Hgb A1c MFr Bld: 7.8 % — ABNORMAL HIGH (ref <5.7)

## 2010-08-05 LAB — TSH: TSH: 2.027 u[IU]/mL (ref 0.350–4.500)

## 2010-08-06 ENCOUNTER — Encounter: Payer: Self-pay | Admitting: Family Medicine

## 2010-08-06 ENCOUNTER — Ambulatory Visit (INDEPENDENT_AMBULATORY_CARE_PROVIDER_SITE_OTHER): Payer: Medicare Other | Admitting: Family Medicine

## 2010-08-06 DIAGNOSIS — F17200 Nicotine dependence, unspecified, uncomplicated: Secondary | ICD-10-CM

## 2010-08-06 DIAGNOSIS — G4730 Sleep apnea, unspecified: Secondary | ICD-10-CM

## 2010-08-06 DIAGNOSIS — M479 Spondylosis, unspecified: Secondary | ICD-10-CM

## 2010-08-06 LAB — CONVERTED CEMR LAB
BUN: 15 mg/dL (ref 6–23)
Basophils Absolute: 0 10*3/uL (ref 0.0–0.1)
Basophils Relative: 1 % (ref 0–1)
Chloride: 103 meq/L (ref 96–112)
Creatinine, Ser: 1.01 mg/dL (ref 0.40–1.20)
Eosinophils Absolute: 0.5 10*3/uL (ref 0.0–0.7)
Hemoglobin: 12.5 g/dL (ref 12.0–15.0)
Hgb A1c MFr Bld: 7.8 % — ABNORMAL HIGH (ref <5.7)
MCHC: 30.7 g/dL (ref 30.0–36.0)
MCV: 95.8 fL (ref 78.0–100.0)
Monocytes Absolute: 0.8 10*3/uL (ref 0.1–1.0)
Monocytes Relative: 10 % (ref 3–12)
Neutro Abs: 4.6 10*3/uL (ref 1.7–7.7)
Neutrophils Relative %: 59 % (ref 43–77)
RBC: 4.25 M/uL (ref 3.87–5.11)
RDW: 13.5 % (ref 11.5–15.5)
TSH: 2.027 microintl units/mL (ref 0.350–4.500)

## 2010-08-08 NOTE — Progress Notes (Signed)
Summary: lab work  Phone Note Call from Patient   Summary of Call: does she need lab work done before she comes in next week. Please call back and let her Initial call taken by: Lind Guest,  August 01, 2010 10:03 AM  Follow-up for Phone Call        yes, hba1c, chem 7, tsh and cbc, all are nion fasting, pls lwet her know and order Follow-up by: Syliva Overman MD,  August 01, 2010 1:05 PM  Additional Follow-up for Phone Call Additional follow up Details #1::        Phone Call Completed Additional Follow-up by: Adella Hare LPN,  August 01, 2010 4:19 PM

## 2010-08-12 ENCOUNTER — Telehealth: Payer: Self-pay | Admitting: Family Medicine

## 2010-08-20 NOTE — Assessment & Plan Note (Signed)
Summary: f up   Vital Signs:  Patient profile:   60 year old female Menstrual status:  hysterectomy Height:      65 inches Weight:      287 pounds BMI:     47.93 O2 Sat:      90 % on 2 L/min Pulse rate:   82 / minute Pulse rhythm:   regular Resp:     18 per minute BP sitting:   118 / 78  (left arm)  Vitals Entered By: Everitt Amber LPN (August 05, 452 4:19 PM)  Nutrition Counseling: Patient's BMI is greater than 25 and therefore counseled on weight management options.  O2 Flow:  2 L/min CC: Follow up chronic problems   CC:  Follow up chronic problems.  History of Present Illness: Reports  that she has been doing fairly well.She notes improvement in her blood sugars, and hjas been more compliant with treatment plan and diet. Denies recent fever or chills. Denies sinus pressure, nasal congestion , ear pain or sore throat. Reports  chest congestion,and cough productive of sputum.currently on antibiotics from pulmonary Denies chest pain, palpitations, PND, orthopnea or leg swelling. Denies abdominal pain, nausea, vomitting, diarrhea or constipation. Denies change in bowel movements or bloody stool. Denies dysuria , frequency, incontinence or hesitancy. Denies  joint pain, swelling, or reduced mobility. Denies headaches, vertigo, seizures. Denies depression, anxiety or insomnia. Denies  rash, lesions, or itch.     Preventive Screening-Counseling & Management  Alcohol-Tobacco     Smoking Cessation Counseling: yes  Current Medications (verified): 1)  Clonidine Hcl 0.1 Mg  Tabs (Clonidine Hcl) .... One Tab By Mouth At Bedtime 2)  Hydrocodone-Acetaminophen 10-500 Mg  Tabs (Hydrocodone-Acetaminophen) .... One Tab By Mouth Four Times Daily 3)  Nexium 40 Mg  Cpdr (Esomeprazole Magnesium) .... One Cap By Mouth Once Daily 4)  Furosemide 40 Mg  Tabs (Furosemide) .... One Tab By Mouth Once Daily 5)  Zolpidem Tartrate 10 Mg  Tabs (Zolpidem Tartrate) .... One Tab By Mouth At  Bedtime 6)  Alprazolam 1 Mg  Tabs (Alprazolam) .... One Tab By Mouth Qid 7)  Tizanidine Hcl 4 Mg  Tabs (Tizanidine Hcl) .... One Tab By Mouth Two Times A Day 8)  Potassium Chloride 20 Meq/38ml (10%) Liqd (Potassium Chloride) .... 7.35ml By Mouth Once Daily 9)  Advair Diskus 250-50 Mcg/dose Misc (Fluticasone-Salmeterol) .... Use One Puff Two Times A Day 10)  Bayer Breeze 2 Test  Disk (Glucose Blood) .... Uad 11)  Glipizide 10 Mg Tabs (Glipizide) .... Take 1 Tablet By Mouth Two Times A Day 12)  Bd Syringes .... For Use With Lantus Vial 13)  Xopenex Hfa 45 Mcg/act Aero (Levalbuterol Tartrate) .... As Needed 14)  Singulair 10 Mg Tabs (Montelukast Sodium) .... Take 1 Tablet By Mouth Once A Day 15)  Duoneb 0.5-2.5 (3) Mg/25ml Soln (Ipratropium-Albuterol) .... Use One Vial Per Nebulizer Four Times Daily 16)  Aspir-Low 81 Mg Tbec (Aspirin) .... Take 1 Tablet By Mouth Once A Day 17)  Levothroid 100 Mcg Tabs (Levothyroxine Sodium) .... Take 1 Tablet By Mouth Once A Day 18)  Lisinopril 20 Mg Tabs (Lisinopril) .... Take 1 Tablet By Mouth Once A Day 19)  Diabetic Shoes .... With Inserts X 1 Pair 20)  Bayer Breeze 2 Test  Disk (Glucose Blood) .... Lancets and Strips For Four Times A Day Testing 21)  Mobic 15 Mg Tabs (Meloxicam) .... Take 1 Tablet By Mouth Once A Day 22)  Bd Insulin Syringe Ultrafine  31g X 5/16" 1 Ml Misc (Insulin Syringe-Needle U-100) .... Use As Directed For Once Daily Testing 23)  Tums 500 Mg Chew (Calcium Carbonate Antacid) .... As Needed 24)  Guaifenesin 100 Mg/89ml Syrp (Guaifenesin) .Marland KitchenMarland KitchenMarland Kitchen 15ml By Mouth Four Times Daily 25)  Avelox Abc Pack 400 Mg Tabs (Moxifloxacin Hcl) .... Oen Tab By Mouth Once Daily 26)  Lantus Solostar 100 Unit/ml Soln (Insulin Glargine) .... 20 Units At Bedtime 27)  Crestor 20 Mg Tabs (Rosuvastatin Calcium) .... One Tablet Every Monday, Wednesday An Friday Night 28)  Lantus 100 Unit/ml Soln (Insulin Glargine) .... 20 Units At Bedtime  Allergies (verified): 1)  !  Codeine  Review of Systems General:  Complains of fatigue and sleep disorder. Eyes:  Complains of vision loss-both eyes. Resp:  Complains of cough and shortness of breath; cough and chest congestion x 2 weeks, now on avelox. MS:  Complains of joint pain, low back pain, mid back pain, muscle weakness, and stiffness. Psych:  Complains of anxiety, depression, and mental problems; denies suicidal thoughts/plans, thoughts of violence, and unusual visions or sounds; folloows closely with psych . Endo:  Denies excessive thirst and excessive urination. Heme:  Denies abnormal bruising and bleeding. Allergy:  Complains of seasonal allergies; denies hives or rash and itching eyes.  Physical Exam  General:  Well-developed obese,in no acute distress; alert,appropriate and cooperative throughout examination. HEENT: No facial asymmetry,  EOMI, No sinus tenderness, TM's Clear, oropharynx  pink and moist.   Chest: Clear to auscultation bilaterally. Decreased air entry throughout. CVS: S1, S2, No murmurs, No S3.   Abd: Soft, Nontender.  MS: decreased  ROM spine, hips, shoulders and knees.  Ext: No edema.   CNS: CN 2-12 intact, power and  tone within .reduced sensation in the feet   Skin: Intact, no visible lesions or rashes.  Psych: Good eye contact, normal affect.  Memory fair, not anxious or depressed appearing.    Impression & Recommendations:  Problem # 1:  HYPOTHYROIDISM (ICD-244.9) Assessment Unchanged  Her updated medication list for this problem includes:    Levothroid 100 Mcg Tabs (Levothyroxine sodium) .Marland Kitchen... Take 1 tablet by mouth once a day  Orders: T-TSH ([id number])  Labs Reviewed: TSH: 2.027 (08/05/2010)    HgBA1c: 7.8 (08/05/2010) Chol: 168 (05/04/2010)   HDL: 41 (05/04/2010)   LDL: 108 (05/04/2010)   TG: 94 (05/04/2010)  Problem # 2:  NICOTINE ADDICTION (ICD-305.1) Assessment: Unchanged  Encouraged smoking cessation and discussed different methods for smoking cessation.    Problem # 3:  DIABETES MELLITUS, UNCONTROLLED (ICD-250.02) Assessment: Improved  The following medications were removed from the medication list:    Lantus Solostar 100 Unit/ml Soln (Insulin glargine) .Marland Kitchen... 20 units at bedtime    Glipizide 5 Mg Tabs (Glipizide) .Marland Kitchen... Take 1 tablet by mouth two times a day    Lantus 100 Unit/ml Soln (Insulin glargine) .Marland Kitchen... 20 units at bedtime Her updated medication list for this problem includes:    Glipizide 10 Mg Tabs (Glipizide) .Marland Kitchen... Take 1 tablet by mouth two times a day    Aspir-low 81 Mg Tbec (Aspirin) .Marland Kitchen... Take 1 tablet by mouth once a day    Lisinopril 20 Mg Tabs (Lisinopril) .Marland Kitchen... Take 1 tablet by mouth once a day    Lantus 100 Unit/ml Soln (Insulin glargine) .Marland KitchenMarland KitchenMarland KitchenMarland Kitchen 80 units at bedtime , dose increase effective 08/06/2010  Orders: T- Hemoglobin A1C ([id number])  Labs Reviewed: Creat: 1.01 (08/05/2010)    Reviewed HgBA1c results: 7.8 (08/05/2010)  9.6 (05/04/2010)  Problem #  4:  HYPERTENSION (ICD-401.9) Assessment: Unchanged  Her updated medication list for this problem includes:    Clonidine Hcl 0.1 Mg Tabs (Clonidine hcl) ..... One tab by mouth at bedtime    Furosemide 40 Mg Tabs (Furosemide) ..... One tab by mouth once daily    Lisinopril 20 Mg Tabs (Lisinopril) .Marland Kitchen... Take 1 tablet by mouth once a day  Orders: T-Basic Metabolic Panel [id number])  BP today: 118/78 Prior BP: 110/70 (05/07/2010)  Labs Reviewed: K+: 4.5 (08/05/2010) Creat: : 1.01 (08/05/2010)   Chol: 168 (05/04/2010)   HDL: 41 (05/04/2010)   LDL: 108 (05/04/2010)   TG: 94 (05/04/2010)  Problem # 5:  COPD (ICD-496) Assessment: Deteriorated  Her updated medication list for this problem includes:    Advair Diskus 250-50 Mcg/dose Misc (Fluticasone-salmeterol) ..... Use one puff two times a day    Xopenex Hfa 45 Mcg/act Aero (Levalbuterol tartrate) .Marland Kitchen... As needed    Singulair 10 Mg Tabs (Montelukast sodium) .Marland Kitchen... Take 1 tablet by mouth once a day    Duoneb  0.5-2.5 (3) Mg/37ml Soln (Ipratropium-albuterol) ..... Use one vial per nebulizer four times daily  Pulmonary Functions Reviewed: O2 sat: 90 (08/06/2010)   on 2l oxygen  Vaccines Reviewed: Pneumovax: Pneumovax (04/13/2007)   Flu Vax: Fluvax MCR (02/21/2009)  Problem # 6:  OBESITY (ICD-278.00) Assessment: Deteriorated  Ht: 65 (08/06/2010)   Wt: 287 (08/06/2010)   BMI: 47.93 (08/06/2010) therapeutic lifestyle change discussed and encouraged  Complete Medication List: 1)  Clonidine Hcl 0.1 Mg Tabs (Clonidine hcl) .... One tab by mouth at bedtime 2)  Hydrocodone-acetaminophen 10-500 Mg Tabs (Hydrocodone-acetaminophen) .... One tab by mouth four times daily 3)  Nexium 40 Mg Cpdr (Esomeprazole magnesium) .... One cap by mouth once daily 4)  Furosemide 40 Mg Tabs (Furosemide) .... One tab by mouth once daily 5)  Zolpidem Tartrate 10 Mg Tabs (Zolpidem tartrate) .... One tab by mouth at bedtime 6)  Alprazolam 1 Mg Tabs (Alprazolam) .... One tab by mouth qid 7)  Tizanidine Hcl 4 Mg Tabs (Tizanidine hcl) .... One tab by mouth two times a day 8)  Potassium Chloride 20 Meq/24ml (10%) Liqd (Potassium chloride) .... 7.53ml by mouth once daily 9)  Advair Diskus 250-50 Mcg/dose Misc (Fluticasone-salmeterol) .... Use one puff two times a day 10)  Bayer Breeze 2 Test Disk (Glucose blood) .... Uad 11)  Glipizide 10 Mg Tabs (Glipizide) .... Take 1 tablet by mouth two times a day 12)  Bd Syringes  .... For use with lantus vial 13)  Xopenex Hfa 45 Mcg/act Aero (Levalbuterol tartrate) .... As needed 14)  Singulair 10 Mg Tabs (Montelukast sodium) .... Take 1 tablet by mouth once a day 15)  Duoneb 0.5-2.5 (3) Mg/7ml Soln (Ipratropium-albuterol) .... Use one vial per nebulizer four times daily 16)  Aspir-low 81 Mg Tbec (Aspirin) .... Take 1 tablet by mouth once a day 17)  Levothroid 100 Mcg Tabs (Levothyroxine sodium) .... Take 1 tablet by mouth once a day 18)  Lisinopril 20 Mg Tabs (Lisinopril) .... Take 1  tablet by mouth once a day 19)  Diabetic Shoes  .... With inserts x 1 pair 20)  Bayer Breeze 2 Test Disk (Glucose blood) .... Lancets and strips for four times a day testing 21)  Mobic 15 Mg Tabs (Meloxicam) .... Take 1 tablet by mouth once a day 22)  Bd Insulin Syringe Ultrafine 31g X 5/16" 1 Ml Misc (Insulin syringe-needle u-100) .... Use as directed for once daily testing 23)  Tums 500 Mg  Chew (Calcium carbonate antacid) .... As needed 24)  Guaifenesin 100 Mg/19ml Syrp (Guaifenesin) .Marland KitchenMarland KitchenMarland Kitchen 15ml by mouth four times daily 25)  Avelox Abc Pack 400 Mg Tabs (Moxifloxacin hcl) .... Oen tab by mouth once daily 26)  Crestor 20 Mg Tabs (Rosuvastatin calcium) .... One tablet every monday, wednesday an friday night 27)  Lantus 100 Unit/ml Soln (Insulin glargine) .... 80 units at bedtime , dose increase effective 08/06/2010  Other Orders: Medicare Electronic Prescription [id number]) T-Lipid Profile [id number]) T-Hepatic Function [phone])  Patient Instructions: 1)  Please schedule a follow-up appointment in 3 months. 2)  It is important that you exercise regularly at least 20 minutes 5 times a week. If you develop chest pain, have severe difficulty breathing, or feel very tired , stop exercising immediately and seek medical attention. 3)  You need to lose weight. Consider a lower calorie diet and regular exercise.  4)  Tobacco is very bad for your health and your loved ones! You Should stop smoking!. 5)  Stop Smoking Tips: Choose a Quit date. Cut down before the Quit date. decide what you will do as a substitute when you feel the urge to smoke(gum,toothpick,exercise).start at 6 per day, and reduce by one each month 6)  cONGRATS on greatly improved blood sugars. 7)  Increase the lantus to 65 units today, and increase every 3 days as needed. 8)  BMP prior to visit, ICD-9: 9)  Hepatic Panel prior to visit, ICD-9: 10)  Lipid Panel prior to visit, ICD-9: 11)  TSH prior to visit, ICD-9:  fasting in 3  months. 12)  yOU nEED to schedule your mamoram and get it before your return visit 13)  HbgA1C prior to visit, ICD-9: Prescriptions: ALPRAZOLAM 1 MG  TABS (ALPRAZOLAM) one tab by mouth qid  #120 x 3   Entered by:   Adella Hare LPN   Authorized by:   Syliva Overman MD   Signed by:   Adella Hare LPN on 13/12/6576   Method used:   Printed then faxed to ...       [facility]  [address] [address] [address] ([facility]ail[facility]       [address]       [place], [place]  [address]       Ph: [phone]       Fax: [phone]   RxID:   [id number] ZOLPIDEM TARTRATE 10 MG  TABS (ZOLPIDEM TARTRATE) one tab by mouth at bedtime  #30 x 3   Entered by:   Adella Hare LPN   Authorized by:   Syliva Overman MD   Signed by:   Adella Hare LPN on 64/33/2951   Method used:   Printed then faxed to ...       [facility]  [address] (retail)       [address]       [place], [place]  [address]       Ph: [phone]       Fax: [phone]   RxID:   [id number] HYDROCODONE-ACETAMINOPHEN 10-500 MG  TABS (HYDROCODONE-ACETAMINOPHEN) one tab by mouth four times daily  #120 x 3   Entered by:   Adella Hare LPN   Authorized by:   Syliva Overman MD   Signed by:   Adella Hare LPN on 51/76/1607   Method used:   Printed then faxed to ...       [facility] [address] (retail)  [address]       [place], [place]  [address]       Ph: [phone]       Fax: [phone]   RxID:   [id number] MOBIC 15 MG TABS (MELOXICAM) Take 1 tablet by mouth once a day  #30 Tablet x 3   Entered by:   Adella Hare LPN   Authorized by:   Syliva Overman MD   Signed by:   Adella Hare LPN on 84/13/2440   Method used:   Electronically to        [facility] [address] (retail)       [address]       [place], [place]  [address]       Ph: [phone]       Fax: [phone]   RxID:   [id number] LISINOPRIL 20 MG TABS (LISINOPRIL) Take 1  tablet by mouth once a day  #30 Tablet x 3   Entered by:   Adella Hare LPN   Authorized by:   Syliva Overman MD   Signed by:   Adella Hare LPN on 30/16/0109   Method used:   Electronically to        [facility] [address] (retail)       [address]       [place], [place]  [address]       Ph: [phone]       Fax: [phone]   RxID:   [id number] SINGULAIR 10 MG TABS (MONTELUKAST SODIUM) Take 1 tablet by mouth once a day  #30 Tablet x 3   Entered by:   Adella Hare LPN   Authorized by:   Syliva Overman MD   Signed by:   Adella Hare LPN on 48/54/6270   Method used:   Electronically to        [facility] [address] (retail)       [address]       [place], [place]  [address]       Ph: [phone]       Fax: [phone]   RxID:   [id number] POTASSIUM CHLORIDE 20 MEQ/15ML (10%) LIQD (POTASSIUM CHLORIDE) 7.25ml by mouth once daily  #473 Millilit x 3   Entered by:   Adella Hare LPN   Authorized by:   Syliva Overman MD   Signed by:   Adella Hare LPN on 35/36/1443   Method used:   Electronically to        [facility] [address] (retail)       [address]       [place], [place]  [address]       Ph: [phone]       Fax: [phone]   RxID:   [id number] TIZANIDINE HCL 4 MG  TABS (TIZANIDINE HCL) one tab by mouth two times a day  #60 Tablet x 3   Entered by:   Adella Hare LPN   Authorized by:   Syliva Overman MD   Signed by:   Adella Hare LPN on 19/37/9024   Method used:   Electronically to        [facility] [address] (retail)       [address]       [place], [place]  [address]       Ph: [phone]       Fax: [phone]   RxID:   [id number] FUROSEMIDE 40 MG  TABS (FUROSEMIDE) one tab by mouth once daily  #30 Tablet x 3   Entered by:   Adella Hare LPN   Authorized by:   Syliva Overman MD   Signed by:   Adella Hare LPN on 84/13/2440   Method  used:   Electronically to        [facility] [address] (retail)       [address]       [place], [place]  [address]       Ph: [phone]       Fax: [phone]   RxID:   [id number] NEXIUM 40 MG  CPDR (ESOMEPRAZOLE MAGNESIUM) one cap by mouth once daily  #30 Capsule x 3   Entered by:   Adella Hare LPN   Authorized by:   Syliva Overman MD   Signed by:   Adella Hare LPN on 30/16/0109   Method used:   Electronically to        [facility] [address] (retail)       [address]       [place], [place]  [address]       Ph: [phone]       Fax: [phone]   RxID:   [id number] CLONIDINE HCL 0.1 MG  TABS (CLONIDINE HCL) one tab by mouth at bedtime  #30 Tablet x 3   Entered by:   Adella Hare LPN   Authorized by:   Syliva Overman MD   Signed by:   Adella Hare LPN on 48/54/6270   Method used:   Electronically to        [facility] [address] (retail)       [address]       [place], [place]  [address]       Ph: [phone]       Fax: [phone]   RxID:   [id number] LANTUS 100 UNIT/ML SOLN (INSULIN GLARGINE) 80 units at bedtime , dose increase effective 08/06/2010  #2400 units x 4   Entered and Authorized by:   Syliva Overman MD   Signed by:   Syliva Overman MD on 08/06/2010   Method used:   Printed then faxed to ...       [facility]  [address] [address] (retail)       [address]       [place], [place]  [address]       Ph: [phone]       Fax: [phone]   RxID:   [id number]    Orders Added: 1)  Est. Patient Level IV [[address]] 2)  Medicare Electronic Prescription [G8553] 3)  T-Basic Metabolic Panel [[id number]] 4)  T-Lipid Profile [80061-22930] 5)  T-Hepatic Function [80076-22960] 6)  T- Hemoglobin A1C [83036-23375] 7)  T-TSH [[id number]]

## 2010-08-20 NOTE — Progress Notes (Signed)
Summary: congested  Phone Note Call from Patient   Summary of Call: seen Dr. last week and  she is still congested please call her at [phone] Initial call taken by: Lind Guest,  August 12, 2010 2:14 PM  Follow-up for Phone Call        she needs to get back with Dr Juanetta Gosling about her congestion , when she was here she told me he ha her on avelox for this so sdhe needs to contact him about this Follow-up by: Syliva Overman MD,  August 12, 2010 4:54 PM  Additional Follow-up for Phone Call Additional follow up Details #1::        Phone Call Completed Additional Follow-up by: Adella Hare LPN,  August 12, 2010 5:14 PM

## 2010-08-30 ENCOUNTER — Encounter (INDEPENDENT_AMBULATORY_CARE_PROVIDER_SITE_OTHER): Payer: Medicare Other | Admitting: Psychology

## 2010-08-30 DIAGNOSIS — F4001 Agoraphobia with panic disorder: Secondary | ICD-10-CM

## 2010-08-30 DIAGNOSIS — F332 Major depressive disorder, recurrent severe without psychotic features: Secondary | ICD-10-CM

## 2010-09-03 ENCOUNTER — Other Ambulatory Visit: Payer: Self-pay | Admitting: Family Medicine

## 2010-09-03 ENCOUNTER — Telehealth: Payer: Self-pay | Admitting: Family Medicine

## 2010-09-04 NOTE — Telephone Encounter (Signed)
noted 

## 2010-10-01 ENCOUNTER — Encounter (HOSPITAL_COMMUNITY): Payer: Medicare Other | Admitting: Psychology

## 2010-10-07 ENCOUNTER — Encounter (INDEPENDENT_AMBULATORY_CARE_PROVIDER_SITE_OTHER): Payer: Medicare Other | Admitting: Psychology

## 2010-10-07 DIAGNOSIS — F332 Major depressive disorder, recurrent severe without psychotic features: Secondary | ICD-10-CM

## 2010-10-07 DIAGNOSIS — F4001 Agoraphobia with panic disorder: Secondary | ICD-10-CM

## 2010-10-18 NOTE — H&P (Signed)
[name], [name]               ACCOUNT NO.:  [id number]   MEDICAL RECORD NO.:  [id number]          PATIENT TYPE:  OIB   LOCATION:  2116                         FACILITY:  [facility]   PHYSICIAN:  Ines Bloomer, M.D. DATE OF BIRTH:  November 20, 1950   DATE OF ADMISSION:  06/13/2004  DATE OF DISCHARGE:                                HISTORY & PHYSICAL   CHIEF COMPLAINT:  Large lymph nodes, unable to breath.   HISTORY OF PRESENT ILLNESS:  This 60 year old African American female had  had progressive dyspnea.  She was known to have morbid obesity.  She was in  the hospital on May 07, 2004, at [facility] with hypercarbic  hypoxic respiratory failure.  She also has morbid obesity with restrictive  airway disease and obesity hyperventilation syndrome and probable sleep  apnea.  She also has chronic tobacco abuse and still smokes.  After she was  discharged from the hospital, she was placed on oxygen.  Her past medical  history is significant in that in May 2005, Dr. Allegra Grana operated on her  for a left thyroid mass.  At that time, she was noted to have some  difficulty in intubation.  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  All of 2005, she gained weight and  continued to have progressive edema.  When she was in the hospital in [facility], a CT scan showed mediastinal hilar adenopathy, and she was seen by Dr.  Juanetta Gosling who recommended she have a biopsy.  She was referred to our office  for a mediastinoscopy to rule out sarcoid or bladder malignancy or lymphoma.  AT the time in the office, it was explained to her that she was a very  difficult intubation and that she may be on a ventilator after the  procedure.  She was then seen by Dr. Jacklynn Bue who also explained the same  situation to her.  She was brought to the operating room and was then unable  to be intubated by Dr. Michelle Piper and an emergency tracheostomy had to be  performed by Dr.  Edwyna Shell and Dr. Darrick Penna.  At the time, CPR was needed for a  short time.  She had a possible 5-10 minute time of anoxia, and so anoxic  brain injury needed to be ruled out.  She was transferred to the ICU in good  condition after family had been informed of the events.   PAST MEDICAL HISTORY:  1.  Diabetes mellitus, type 2, poorly controlled.  2.  Hypertension.  3.  Esophageal reflux.  4.  Massive morbid obesity.  5.  Hypothyroidism.  6.  Hyperlipidemia.   MEDICATIONS:  1.  Thyroxine 50 mcg daily.  2.  Nexium 40 mg daily.  3.  Lantus 50 mg daily.  4.  Nitrol inhalers.  5.  Crestor 3 mg daily.  6.  Xanax 0.25 mg daily.  7.  Norvasc 10 mg daily.  8.  Potassium 20 mg daily.  9.  Lasix 40 mg daily.  10. Lisinopril/hydrochlorothiazide 20/12.5 mg daily.  11.  Aspirin 325 mg daily.  12. Advair 250/50 one puff b.i.d.  13. Amaryl 4 mg b.i.d.  14. Atenolol 40 mg daily.  15. DuoDerm q.4h.  16. Oxygen.   LABORATORY DATA:  Pulmonary function test showed that her FEC was 0.105 and  FE 1.8.   FAMILY HISTORY:  Positive for obesity, hypertension and  hypercholesterolemia.   SOCIAL HISTORY:  She was seen in the office with her sister.  She smokes two  cigarettes a day, does not drink alcohol on a regular basis.   REVIEW OF SYSTEMS:  As mentioned in the history of present illness.  Her eye  sight is no change.  Ears:  Normal hearing.  Nose:  She has some sinus  problems.  Neck:  As in history of present illness.  Previous thyroid  surgery.  Lungs:  Shortness of breath and history of present illness.  Heart:  Again, history of present illness, no history of angina or  arrhythmia.  Abdomen:  She has reflux as in history of present illness and  morbid obesity.  She denies dysuria or frequency.  Extremities:  Impressionable 1+ pulses, 1+ edema, morbid obesity. Neurological:  At  present time, she was oriented x3 prior to the surgery, and her sensory and  motor were intact.  She did have some  hoarseness, indicative, she might have  some vocal cord dysfunction.  Skin:  Normal.   IMPRESSION:  1.  Possible anoxic brain injury.  2.  Respiratory failure secondary to morbid obesity, restrictive lung      disease.  3.  Emergency tracheostomy, unable to ventilate.  4.  Diabetes mellitus.  5.  Hypertension.  6.  Hypercholesterolemia.  7.  Gastroesophageal reflux.  8.  Sleep apnea.   PLAN:  Support on ventilator and evaluation of her possible anoxic brain  injury.       DPB/MEDQ  D:  06/13/2004  T:  06/13/2004  Job:  [id number]

## 2010-10-18 NOTE — Discharge Summary (Signed)
[name], [name]               ACCOUNT NO.:  [id number]   MEDICAL RECORD NO.:  [id number]          PATIENT TYPE:  INP   LOCATION:  3103                         FACILITY:  [facility]   PHYSICIAN:  Ines Bloomer, M.D. DATE OF BIRTH:  1950-11-10   DATE OF ADMISSION:  06/13/2004  DATE OF DISCHARGE:  06/28/2004                                 DISCHARGE SUMMARY   ADMISSION DIAGNOSES:  Enlarged lymph nodes and shortness of breath.   ADDITIONAL/DISCHARGE DIAGNOSES:  1.  Difficult intubation on June 13, 2004, and subsequent emergency      tracheostomy with hypoxia and hypercarbia.  2.  VDRF on this admission secondary to #1.  3.  History of restricted airway disease.  4.  History of hypercapnic respiratory failure prior to this admission.  5.  Obstructive sleep apnea.  6.  Morbid obesity.  7.  Status post thyroid mass resection in the past.  8.  History of mediastinal and hilar adenopathy.  9.  Gastroesophageal reflux disease.  10. Hypercholesterolemia.  11. Hypothyroidism.  12. Hypertension.  13. Vocal cord polyps.   HOSPITAL MANAGEMENT/PROCEDURES:  1.  Emergency tracheostomy secondary to failed intubation on June 13, 2004.  The patient required CPR for 5-10 minutes, as well.  2.  Revision of tracheostomy, video bronchoscopy, and left subclavian      catheter placement on June 15, 2004.  3.  Initiation of tube feeds for nutritional support.  4.  CT scan of the head on June 17, 2004.  5.  EEG completed on June 17, 2004.  6.  Tracheostomy.  The patient weaned from ventilator support and      tracheostomy collar placed on June 17, 2004.  7.  IV antibiotic therapy.  8.  Steroid burst therapy.  9.  FEES swallow evaluation on June 21, 2004.  10. PICC line placement on June 24, 2004.  11. Fiberoptic upper endoscopy on June 25, 2004.  12. Downsizing of tracheostomy to #6 cuffless on June 27, 2004.   CONSULTATIONS:  1.  Pulmonary/critical care  medicine.  2.  Neurology.  3.  Diabetes management services.  4.  Nutrition.  5.  Occupational therapy.  6.  Physical therapy.  7.  Speech pathology.  8.  ENT.   HISTORY OF PRESENT ILLNESS:  [name] is a 60 year old black female with  a history of morbid obesity and sleep apnea.  The patient has had problems  in the past with hypercapnic respiratory failure.  The patient was found to  have mediastinal and hilar adenopathy, and was referred to Dr. Edwyna Shell for  further evaluation.  Dr. Edwyna Shell scheduled a mediastinoscopy with lymph node  biopsy for June 13, 2004.  Risks, benefits, and alternatives of the  procedure were discussed with the patient at that time.  The patient was in  understanding and agreed to proceed as planned.  The patient did have a  history of difficult intubation in the past.  At the office, Dr. Edwyna Shell  explained to her that she was a very difficult intubation, and that she may  be on  a ventilator after the planned procedure.  The patient was in  understanding and agreed to proceed.   HOSPITAL COURSE:  [name] was admitted electively to [facility] on June 13, 2004.  She was taken to the operating room for a  mediastinoscopy and mediastinal lymph node biopsy.  Upon attempted  intubation, the patient went into respiratory arrest and required emergency  tracheostomy completed by Dr. Edwyna Shell and Dr. Darrick Penna.  At the time, CPR was  needed for a short time, approximately 5-10 minutes.  There was a  possibility of 5-10 minute time span of brain anoxia.  The patient was  ultimately stabilized and transferred to the intensive care unit in critical  condition.   A pulmonary/critical care medicine consultation was initiated for assistance  in management of this difficult situation.  The patient remained sedated and  on ventilator support over the next several hospital days, until her  clinical status was stabilized.  She was initiated on IV antibiotic  therapy,  as well as IV steroid burst.  Aggressive pulmonary care was maintained.  The  patient was taken back to the operating room on June 15, 2004, and a  permanent tracheostomy was placed at that time.  A video bronchoscopy was  also completed, in addition to a left subclavian catheter line.   Over the next several hospital days, the patient was slowly weaned from  ventilator support.  She was initiated on tube feedings for nutritional  support.  A CT scan of the brain was completed.  This was negative for any  acute abnormalities.  Ultimately, the patient was weaned from ventilator  support and placed on a tracheostomy collar on June 19, 2004.  She  continued to do quite well, and a swallow study was planned.   The patient was seen and evaluated by speech pathology on June 21, 2004.  They completed an FEES.  This showed severe oropharyngeal dysphagia.  There  was also an abnormality noted between the patient's vocal cords.  Only the  right vocal cord could be visualized upon having the patient phonate.  There  was also evidence of severe edema and probable granuloma.  The  recommendations were to maintain NPO and tube feedings.   Over the next several hospital days, the patient continued to make slow but  steady progress towards recovery.  She tolerated the tracheostomy collar  very well, and was subsequently switched to a Passy-Muir valve, and  ultimately a #6 cuffless tracheostomy.  She was initiated on an oral diet,  and this was advanced slowly.  All appropriate aspiration precautions were  followed closely.  The patient was evaluated by physical therapy and  occupational therapy, and deemed appropriate for a short stay in the  inpatient rehabilitation center.  A consultation was initiated, and the  rehabilitation service agreed to accept the patient.   Overall, the patient was doing quite well on June 27, 2004.  She had remained afebrile over 48 hours.  She had  remained hemodynamically stable,  and in a normal saline rhythm on telemetry.  She was maintaining SPO2 at 96%  on 28% tracheostomy collar.  Her diabetes was under good control with blood  sugars in the 120s to 130s.  She was breathing and eating well without any  complaints.   DISCHARGE DISPOSITION:  The patient is discharged with the anticipated date  of June 28, 2004.  She is being transferred to the inpatient  rehabilitation service for further aggressive rehabilitative  efforts.  She  is being transferred in much improved and stable condition.   DISCHARGE MEDICATIONS:  1.  Albuterol nebulizers q.6h.  2.  Atrovent nebulizers q.6h.  3.  Pulmicort nebulizer q.6h.  4.  Lovenox 70 mg subcutaneous q.24h.  5.  Potassium chloride 40 mEq daily.  6.  Insulin sliding scale 3-20 units subcutaneous q.6h.  7.  Protonix 40 mg daily.  8.  Levothyroxine 50 mcg daily.  9.  Clonidine 0.1 mg over 24 hours, transdermal.  10. Insulin Glargine 40 units subcutaneous b.i.d.  11. Mucomyst 800 mg inhaled q.6h.  12. Prednisone 20 mg daily.  13. Morphine 2-5 mg IV q.1h. p.r.n. for pain.   DISCHARGE INSTRUCTIONS:  The patient is to be transferred to the inpatient  rehabilitation service.  There, she will receive daily physical therapy and  occupational therapy, and her activity should be increased as tolerated.  Her diet should be maintained at a soft mechanical diet with thickened  liquids, and strict aspiration precautions.  The CVTS service will continue  to follow the patient while she is on the rehabilitation unit.  We will  schedule outpatient follow up once the patient gets closer to discharge from  [facility].      CAF/MEDQ  D:  06/27/2004  T:  06/27/2004  Job:  [id number]   cc:   Jeannett Senior. Pollyann Kennedy, MD  [phone] [place]  [place] [address]  Fax: [phone]   Marcelyn Bruins, M.D. [facility]   Dr. Lodema Hong, [place]

## 2010-10-18 NOTE — Discharge Summary (Signed)
NAMEMARABELLE, CUSHMAN               ACCOUNT NO.:  [id number]   MEDICAL RECORD NO.:  [id number]          PATIENT TYPE:  INP   LOCATION:  A220                          FACILITY:  [facility]   PHYSICIAN:  Osvaldo Shipper, MD     DATE OF BIRTH:  1951-04-07   DATE OF ADMISSION:  08/11/2004  DATE OF DISCHARGE:  03/14/2006LH                                 DISCHARGE SUMMARY   HISTORY OF PRESENT ILLNESS:  Please review the H&P dictated March 12th for  details regarding the patient's present illness.   HOSPITAL COURSE:  Problem 1.  The patient was admitted for respiratory  distress secondary to increased secretions from her tracheostomy.  Please  review the H&P for details regarding the reasons for the tracheostomy.  At  the time of this admission, the patient had very thick secretions which were  yellowish in color.  However, she did not have any fever or any shortness of  breath and her CXR did not suggest a pneumonia.  The patient was  aggressively suctioned during this admission every one hour.  She was also  given Mucomyst nebulizers for its mucolytic effects.  With this regimen, the  patient improved.  Her secretions became more fluid, and she was able to  suction them easily.  The patient needs to follow up with Dr. Pollyann Kennedy who is  her ENT specialist for further trach management.  The patient has been given  a prescription for Mucomyst and has been given instructions on suctioning  every three to four hours at home.  The patient is a nurse, and she is aware  of how to suction herself.   Problem 2.  The patient has other medical issues, including COPD, diabetes,  hypothyroidism, hypertension, obesity, and she was continued on all of her  home medications.  None of these issues were a problem during her hospital  stay.   DISCHARGE MEDICATIONS:  1.  Mucomyst nebulizers every six hours.  2.  The patient will resume all of her outpatient medications for her      various comorbidities.   PROCEDURES PERFORMED:  None.   DIET:  1800 calorie ADA diet.   ACTIVITY:  As tolerated.   FOLLOW UP:  1.  The patient needs to follow up with Dr. Pollyann Kennedy who is her ENT specialist      in two weeks' time for trach management.  2.  She may see her primary doctor, Dr. Syliva Overman, and she can set up      an appointment for the same.   NOTE:  The patient was offered home services; however, she refused them at  this time, and she said that if she thinks she needs it, she will get help  herself.      GK/MEDQ  D:  08/13/2004  T:  08/13/2004  Job:  [id number]   cc:   Milus Mallick. Lodema Hong, M.D.  [address]  [place], [place] [address]  Fax: [phone]   Jeannett Senior. Pollyann Kennedy, MD  [phone] [place]  [place] [address]  Fax: [phone]   Ines Bloomer,  M.D.  [address]  [place]  [place] [address]

## 2010-10-18 NOTE — Op Note (Signed)
NAMECHARLEY, [name]               ACCOUNT NO.:  [id number]   MEDICAL RECORD NO.:  [id number]          PATIENT TYPE:  OIB   LOCATION:  2116                         FACILITY:  [facility]   PHYSICIAN:  Ines Bloomer, M.D. DATE OF BIRTH:  [birth year]   DATE OF PROCEDURE:  06/13/2004  DATE OF DISCHARGE:                                 OPERATIVE REPORT   PREOPERATIVE DIAGNOSES:  1.  Unable to ventilate.  2.  Morbid obesity.   POSTOPERATIVE DIAGNOSES:  1.  Unable to ventilate.  2.  Morbid obesity.  3.  Possible anoxic brain injury.   OPERATION PERFORMED:  Emergency tracheostomy.   SURGEON:  Ines Bloomer, M.D.   FIRST ASSISTANT:  Janetta Hora. Darrick Penna, M.D.   General anesthesia.   This patient was scheduled to be brought to the operating room for a  mediastinoscopy.  She had morbid obesity and had previous problems with  ventilation and had been informed that she may be a difficult ventilation.  For this reason the anesthesiologist had cleared her and she was also told  that she might end up on a ventilator.   After general anesthesia, multiple attempts were made to intubate her using  all types of techniques by Dr. Michelle Piper as well as the anesthetist, as well as  trying the LMA and mask ventilation.  She continued to be more and more  progressively hypoxic.  For this reason she decided to do an emergency  tracheostomy.  She had had a previous thyroid surgery, and this was prepped  and then opened and dissection carried down with electrocautery as quick as  possible down to the trachea.  We thought where we saw the second ring, but  we could not be for sure because of the scarring, but this was opened and  the tracheal dilator was inserted.  Attempt was made first to try to insert  a #6 Shiley, but this would not go in place so we passed a stylet into the  trachea and over the stylet passed a #6 endotracheal tube, and this went in  place and the stylet was removed and the balloon blown  up and then the tube  was sutured in place with 0 silk after first confirming that it was above  the carina.  She was then brought to the intensive care unit in critical  condition.  The family is informed of the inability to intubate.      DPB/MEDQ  D:  06/13/2004  T:  06/13/2004  Job:  [id number]

## 2010-10-18 NOTE — Op Note (Signed)
NAME:  [name], [name]                         ACCOUNT NO.:  [id number]   MEDICAL RECORD NO.:  [id number]                   PATIENT TYPE:  OIB   LOCATION:  2899                                 FACILITY:  [facility]   PHYSICIAN:  Jefry H. Pollyann Kennedy, M.D.                DATE OF BIRTH:  Oct 27, 1950   DATE OF PROCEDURE:  10/26/2002  DATE OF DISCHARGE:                                 OPERATIVE REPORT   PREOPERATIVE DIAGNOSIS:  Left thyroid mass.   POSTOPERATIVE DIAGNOSIS:  Left thyroid mass.   OPERATION PERFORMED:  Left hemithyroidectomy.   SURGEON:  Jefry H. Pollyann Kennedy, M.D.   ASSISTANT:  Suzanna Obey, M.D.   ANESTHESIA:  General endotracheal.   COMPLICATIONS:  None.   FINDINGS:  Large soft fleshy mass involving nearly the entire left lobe of  the thyroid.  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   ESTIMATED BLOOD LOSS:  50mL.   COMPLICATIONS:  None.   The patient tolerated the procedure well, was awakened, extubated and  transferred to recovery in stable condition.   REFERRING PHYSICIAN:  Dr. Lodema Hong, in [place].   INDICATIONS FOR PROCEDURE:  The patient is a 60 year old lady who was  recently found to have a large solid thyroid mass left side 5.2 cm with  compression on the trachea.  Needle aspiration biopsy revealed follicular  cells.  The risks, benefits, alternatives and complications of the procedure  were explained to the patient, who  seemed to understand and agreed to  surgery.   DESCRIPTION OF PROCEDURE:  The patient was taken to the operating room and  placed on the operating table in the supine position.  Following induction  of general endotracheal anesthesia, via the nasotracheal route since there  was significant difficulty obtaining a secure airway orally, the neck was  prepped and draped in the standard fashion.  A transverse incision was  outlined for a lower cervical skin crease and electrocautery was used to  incise the skin and  subcutaneous tissue through the platysma.  The  subplatysmal flaps were developed superiorly to the thyroid notch and  inferiorly to the clavicle.  Thyroid retractor was used to retract the skin  flaps.  The midline fascia was divided and diastasis of the strap muscles  was identified and divided as well.  The thyroid isthmus was identified and  the strap muscles were laterally reflected towards the left side.  Careful  dissection around the thyroid lobe was then accomplished using careful blunt  dissection with hemostat.  The superior pole vessels were ligated between  clamps and divided.  Middle thyroid vein was also ligated and divided.  The  thyroid lobe was brought forward.  A superior and inferior parathyroid gland  were identified and preserved with their blood supplies.  The recurrent  laryngeal nerve was identified and preserved as well.  The ligament of Allyson Sabal  was divided and the thyroid was  dissected off the face of the trachea.  The  isthmus was divided and chromic suture was used to provide hemostasis of the  cut edge of the remaining isthmus.  The thyroid lobe was sent for pathologic  evaluation.  Frozen section consistent with follicular neoplasm. The wound  was irrigated.  Hemostasis was completed and then the wound was closed in  layers using 3-0 and 4-0 chromic on the platysmal layer and the midline  fascia and running 5-0 nylon on the skin.  A 7 French round Jackson-Pratt  drain was left in the wound exiting to the right side of the incision and  secured in place with a nylon suture.  The patient was then awakened from  anesthesia, extubated and transferred to recovery in stable condition.                                                Jefry H. Pollyann Kennedy, M.D.    JHR/MEDQ  D:  10/26/2002  T:  10/26/2002  Job:  [id number]

## 2010-10-18 NOTE — H&P (Signed)
[name], [name]               ACCOUNT NO.:  [id number]   MEDICAL RECORD NO.:  [id number]          PATIENT TYPE:  EMS   LOCATION:  ED                            FACILITY:  [facility]   PHYSICIAN:  Calvert Cantor, M.D.     DATE OF BIRTH:  [birth year]   DATE OF ADMISSION:  08/27/2004  DATE OF DISCHARGE:  LH                                HISTORY & PHYSICAL   PRIMARY CARE PHYSICIAN:  Dr. Lodema Hong.   PULMONOLOGIST:  Dr. Juanetta Gosling.   ADMITTING COMPLAINT:  Shortness of breath.   HISTORY OF PRESENT ILLNESS:  This is a 60 year old, African-American female  who comes to the ER feeling short of breath.  She has a past medical history  of a tracheostomy done for upper respiratory failure in January 2006.  She  has been in the ER for a few hours now.  She has been suctioned multiple  times and given nebulizer treatments, including Mucomyst treatments, to help  break up the mucus.  She states that she feels better now, however, is not  completely better.  Therefore, she is going to be kept in the hospital until  she can be seen by an ENT physician and her tracheostomy can be reassessed.   REVIEW OF SYSTEMS:  Positive for shortness of breath as mentioned above.  Negative for any cough, chest pain, abdominal pain, headache, fever,  dysuria, diarrhea.   PAST MEDICAL HISTORY:  1.  COPD.  2. Obesity.  3. Diabetes, insulin-dependent.  4. Hypothyroidism.      5. Hypertension.  6. History of a lung mass which is still under      evaluation.   MEDICATIONS:  1.  Lisinopril/HCT 20/25 daily.  2. Nexium 40 mg daily.  3. Amaryl 4 mg      daily.  4. Lantus 45 units in the morning, 50 units at night.  5.      Crestor 20 mg daily.  6. Aspirin 325 mg daily.  7. Lasix 20 mg daily.      8. Potassium 40 mEq daily.  9. Catapres 0.1 mg patch applied weekly.      10. Xanax 0.25 mg 3 times a day.  11. Synthroid 50 mcg daily.  12.      Advair 250/50 b.i.d.  13. Duoneb and Mucomyst every 6 hours.  15.      Darvocet and  Vicodin for pain control from her tracheostomy.   ALLERGIES:  She states that she is allergic to codeine which causes nausea  and vomiting.   PAST SURGICAL HISTORY:  1.  Tracheostomy 2006.  2. Thyroidectomy for a thyroid growth done a few      years ago.  3. Tonsillectomy.  4. Appendectomy.  5. Total abdominal      hysterectomy and bilateral salpingo-oophorectomy.   SOCIAL HISTORY:  She used to smoke about 30 pack a year; however, she  stopped smoking a few months ago.  She lives with her husband, does not  drink any alcohol.   FAMILY HISTORY:  Significant for her father who has throat cancer and mother  who  had diabetes and aunt with thyroid cancer.   PHYSICAL EXAMINATION:  GENERAL:  This is an obese, black female sitting up  in bed in no acute respiratory distress.  VITAL SIGNS:  Temperature 97.9, blood pressure 123/61, pulse 105,  respirations 20, pulse oximetry 99% on 8 L O2.  HEENT:  Atraumatic, normocephalic.  Pupils equal, round, reactive to light  and accommodation.  Oral mucosa is moist.  NECK:  Supple.  Tracheostomy is intact with no surrounding inflammation.  HEART:  Regular rate and rhythm.  CHEST:  Clear, no wheezing, no rales.  ABDOMEN:  Obese, soft, nontender, nondistended.  Bowel sounds are positive.  EXTREMITIES:  Show 2+ pitting edema.  Pedal pulses are positive.   LABORATORY DATA:  Blood work:  White count is 10.7, hemoglobin 11.1,  hematocrit 32.1, MCV 89.2, platelets 558, sodium 133, potassium 4.3,  chloride 102, bicarbonate 29, BUN 10, calcium 9.1, creatinine 1.0.  Pulse  oximetry done on 80% O2 shows pH 7.405, PCO2 36.8, PO2 58.9, bicarbonate  22.6, O2 saturation 90.6%.   ASSESSMENT AND PLAN:  This is a 60 year old, African-American female who  comes in with respiratory distress, most likely secondary to mucus plugging  in her tracheostomy as her respiratory distress has improved with continuous  suctioning and nebulizer treatments.  She is going to be  admitted and a  consult will be placed for ENT who will reassess her tube and possibly  insert a larger tracheostomy tube.  She has no other problems or complaints.  Her home medications will all be resumed.  She will be placed on a NovoLog  sliding scale and receive O2 to maintain her pulse oximetry above 90.  She  has been given a cardiac and diabetic diet.      SR/MEDQ  D:  08/27/2004  T:  08/27/2004  Job:  [id number]   cc:   Dr. Lodema Hong.   Venora Maples  [address]  [place]  [place] [address]  Fax: [phone]

## 2010-10-18 NOTE — Op Note (Signed)
NAMEROSAMUND, NYLAND               ACCOUNT NO.:  [id number]   MEDICAL RECORD NO.:  [id number]          PATIENT TYPE:  OIB   LOCATION:  2116                         FACILITY:  [facility]   PHYSICIAN:  Ines Bloomer, M.D. DATE OF BIRTH:  March 02, 1951   DATE OF PROCEDURE:  06/15/2004  DATE OF DISCHARGE:                                 OPERATIVE REPORT   PREOPERATIVE DIAGNOSIS:  Respiratory failure, status post emergency trach,  anoxic brain injury.   POSTOPERATIVE DIAGNOSIS:  Respiratory failure, status post emergency trach,  anoxic brain injury.   OPERATION PERFORMED:  Video bronchoscopy, revision of tracheostomy and  insertion of left subclavian  CVP lines.   SURGEON:  Ines Bloomer, M.D.   ANESTHESIA:   INDICATIONS FOR PROCEDURE:  This patient 48 hours previously had been unable  to be intubated and had to have an emergency trach done.  She woke up within  12 hours but was very combative so it was worried that she had some anoxic  brain injury.  For this reason she was kept on Diprivan and brought back to  the operating room to revise her trach.  A previous endotracheal tube had  been placed.   DESCRIPTION OF PROCEDURE:  After complete anesthesia, the video bronchoscope  was passed through the mouth and the cords, there was a lot of swelling in  the trachea and the pharynx.  It was hard to identify anything.  We tried to  put an laryngoscope in there and also there was just a lot of swelling and  edema and could not really visualize anything well and thought she may have  to bring her back later to do a direct laryngoscopy.  Because of that,  attention was turned to the trach and the stitches holding the endotracheal  tube were removed.  Dr. Jearld Fenton came in to help.  Dissection was carried down  by the endotracheal tube. The previous tracheal incision was enlarged 1 cm  medially, laterally on each side with electrocautery.  One vein was ligated  with 2-0 silk.  Dissection was  carried down to a markedly scarred trachea.  We found a foreign body there which was an old drain from her previous  thyroid surgery and this was removed as much as possible.  There was intense  scarring around the trachea that is probably from her previous surgery and  maybe the foreign body.  Dissection was started down into the mediastinum on  the trachea dissecting up the scar tissue in the strap muscles and we  realized that the emergency trach was done through the cricothyroid membrane  which is what we thought we might have done at the time it was the only  possibility to get control of the patient.  For this reason dissection was  carried down further down to the second and third ring and a marked amount  of scar tissue with electrocautery and scissors.  This was dissected off.  The second and third ring were then opened with electrocautery and spread  and the endotracheal tube was removed and a #7 long Shiley was  placed  without too much difficulty.  Sutures were placed around and in the rings  for traction.  The Shiley trach was then sutured in place with 2-0 silk and  a Dale tracheostomy holder was applied.  Then the left chest was prepped and  draped in the usual sterile manner and a left subclavian puncture was  performed and a guidewire  threaded through.  The needle was removed.  A dilator was placed over the  guidewire and then the dilator was removed and then the three port Arrow  catheter was placed and sutured in place with 2-0 silk and flushed with  heparin.  The patient tolerated the procedure well and was returned to the  intensive care unit in serious condition.       DPB/MEDQ  D:  06/15/2004  T:  06/15/2004  Job:  [id number]   cc:   Suzanna Obey, M.D.  [address] [place]  [place] [address]  Fax: [phone]

## 2010-10-18 NOTE — Group Therapy Note (Signed)
[name], [name]               ACCOUNT NO.:  [id number]   MEDICAL RECORD NO.:  [id number]          PATIENT TYPE:  INP   LOCATION:  A218                          FACILITY:  [facility]   PHYSICIAN:  Vania Rea, M.D. DATE OF BIRTH:  July 25, 1950   DATE OF PROCEDURE:  DATE OF DISCHARGE:                                   PROGRESS NOTE   SUBJECTIVE:  The patient says she continues to feel much improved.  She now  admits that her husband has been telling her breathing has been getting  worse over the past five months.  She admits that she has been  surreptitiously using the relative's nebulizer for quite some time.  She  says that all of her family has breathing problems and they are all smokers.   OBJECTIVE:  VITAL SIGNS:  Temperature is 98.6, pulse 67, respirations 20,  blood pressure 132/65.  A fasting blood sugar was 107.  In the afternoon her  sugars have been 243 and 238.  She is saturating at 85% on 2 L.  CHEST:  Clear to auscultation bilaterally.  CARDIOVASCULAR:  She has a regular rhythm with a loud second sound.  ABDOMEN:  Massively obese, soft, and nontender.  EXTREMITIES:  She has trace edema bilaterally.   LABORATORIES:  Her ABG this morning on 2 L, her pH is 7.3, PCO2 of 61, PO2  of 61, saturation is 88%.  Her CBC is entirely normal with a hemoglobin of  13.5.  Her serum chemistries, sodium 135, potassium 4.5, chloride 101.  Her  CO2 if 31, glucose 245, her BUN 13, creatinine 0.9, calcium 8.6.  Her  pulmonary function tests show restrictive airway disease with a very  elevated DLCO.  Not much evidence of obstructive disease.   ASSESSMENT:  1.  Diabetes type 2, uncontrolled.  Her fasting blood sugar seemed good on      80 units of Lantus.  We will switch her from a sliding scale to      scheduled Novolog with meals.  2.  Hypertension controlled.  Continue current therapy.  3.  Restrictive airway disease, probably obesity hyperventilation syndrome.  4.  Hypercarbic  respiratory failure.  We will up titration of her oxygen.  5.  History of hypothyroidism.  We will check her thyroid function tests.  6.  Morbid obesity.  I counseled her on weight loss.  7.  Tobacco abuse.  I counseled her on smoking cessation.     Leop   LC/MEDQ  D:  05/09/2004  T:  05/09/2004  Job:  [id number]

## 2010-10-18 NOTE — Assessment & Plan Note (Signed)
[facility]                           GASTROENTEROLOGY OFFICE NOTE   NAME:[name], Audrey BISHARA                      MRN:          [id number]  DATE:12/09/2005                            DOB:          04/17/1951    REFERRING PHYSICIAN:  MARGARET SIMPSON   REFERRING PHYSICIAN:  Dr. Syliva Overman.   REASON FOR CONSULTATION:  Colon mass and virtual colonoscopy.  Evaluate.   HISTORY:  This is a pleasant, but medically complicated 60 year old African  American female with a history of morbid obesity, hypertension, diabetes  mellitus, emphysema, hyperlipidemia, arthritis, anxiety/depression, sleep  apnea, and complications post-surgically for evaluation of sarcoidosis for  which she required tracheostomy.  She presents to the office today with  nasal cannula oxygen after being told that she had an abnormal virtual  colonoscopy performed November 26, 2005.  I have that report for review.  I  reviewed the report with the patient, which states that her exam was a CRADS  Category C1, which means normal colon or benign lesion.  No evidence of mass  or other problems.  The formal portion of the CT scan only revealed small  hiatal hernia.  Some diverticula were noted on the virtual colonoscopy.   ALLERGIES:  No known drug allergies.   MEDICATIONS:  1.  Glucophage 2.5 mL daily.  2.  Crestor 20 mg 3 times per week.  3.  Lisinopril 20/25 mg daily.  4.  Levothroid 50 mcg daily.  5.  Nexium 40 mg daily.  6.  Aspirin 325 mg daily.  7.  Xanax 1 mg q.i.d.  8.  Advair 250/50 one puff b.i.d.  9.  DuoNeb via HHN q.i.d.  10. Mucomyst q.i.d.  11. K-Dur elixir 40 mEq b.i.d.  12. Guaifenesin 10 mL q.4 h.  13. Nasal cannula oxygen continuously.  14. Hydrocodone with acetaminophen t.i.d.   PAST MEDICAL HISTORY:  As above.   PAST SURGICAL HISTORY:  1.  Hysterectomy.  2.  Right lumpectomy.  3.  Tracheostomy.   FAMILY HISTORY:  Negative for gastrointestinal  malignancy.   SOCIAL HISTORY:  Married without children, lives with her husband, has a  nursing degree, does not smoke currently, though did in the past, does not  use alcohol.   REVIEW OF SYSTEMS:  Per diagnostic evaluation form.   PHYSICAL EXAMINATION:  GENERAL:  A chronically ill-appearing female who  appears older than her stated age.  She is quite pleasant.  VITAL SIGNS:  Blood pressure 124/70.  Heart rate is 88.  Weight is 252.6  pounds.  She is 5 feet 4 inches in height.  HEENT:  Sclerae anicteric.  Nasal cannula oxygen in place.  NECK:  Prior area with tracheostomy revealing some scarring.   She was not otherwise examined.   IMPRESSION:  1.  Virtual colonoscopy with no evidence of polyps or other abnormalities.      Only mild diverticulosis.  2.  Gastroesophageal reflux disease, controlled with once-daily Nexium.  3.  Multiple advanced medical problems, as discussed.   RECOMMENDATIONS:  No further plans.  Return to the care of Dr. Lodema Hong.  Wilhemina Bonito. Eda Keys., MD   JNP/MedQ  DD:  12/09/2005  DT:  12/10/2005  Job #:  [id number]   cc:   Milus Mallick. Lodema Hong, MD

## 2010-10-18 NOTE — Op Note (Signed)
NAMEEILIDH, MARCANO               ACCOUNT NO.:  [id number]   MEDICAL RECORD NO.:  [id number]          PATIENT TYPE:  OIB   LOCATION:  2899                         FACILITY:  [facility]   PHYSICIAN:  Jefry H. Pollyann Kennedy, MD     DATE OF BIRTH:  1950-10-11   DATE OF PROCEDURE:  09/02/2004  DATE OF DISCHARGE:                                 OPERATIVE REPORT   PREOPERATIVE DIAGNOSIS:  Severe obstructive sleep apnea with obesity,  tracheostomy stenosis and a vocal cord polyps.Marland Kitchen   POSTOPERATIVE DIAGNOSES:  Severe obstructive sleep apnea with obesity,  tracheostomy stenosis and  vocal cord polyps.   PROCEDURE:  Direct laryngoscopy and changing of tracheostomy.   SURGEON:  Jefry H. Pollyann Kennedy, M.D.   FINDINGS:  Complete inability to view the laryngeal structures using  multiple direct laryngoscopes, bronchoscope and even an esophagoscope  secondary to the patient's obesity, anatomy of the neck, anterior tilt of  the larynx, severe fullness of the lingual tonsils and the base of tongue  and due to the patient's extensive dentition.   COMPLICATIONS:  No complications.   ESTIMATED BLOOD LOSS:  No blood loss.   The patient tolerated the procedure and was transferred to recovery in  stable condition.   HISTORY:  This is a 60 year old lady who required emergent tracheostomy  several months ago while undergoing  induction of anesthesia for a thoracic  surgery procedure.  She has had trouble with the tracheostomy ever since.  She is interested in decannulation but she also has some tracheal stenosis  at the tracheostomy site and vocal cord polyps.  Risks, benefits,  alternatives, complications of the procedure were explained to the patient,  who seemed to understand and agreed to surgery.   PROCEDURE:  The patient was taken to the operating room, placed on the  operating table in the supine position.  Following induction of general  endotracheal anesthesia via the previous tracheostomy, the patient  was  positioned and draped in the standard fashion.  Using a maxillary tooth  guard, multiple attempts were made to view the larynx using a Jako  laryngoscope, an anterior commissure laryngoscope, a rigid bronchoscope and  a rigid esophagoscope; all without being able to get anterior enough to view  the larynx.   The decision was then made to abort any attempt at treating the vocal cord  polyps and to simply go ahead and upsize the tracheostomy. A #6 endotracheal  tube was used through the tracheostomy for the anesthetic. This was removed  and a #4 Shiley uncuffed tube was then placed with minimal difficulty.  This  was left for several minutes for  dilatation. A #6 tube was then placed with some difficulty but was entered  reasonably well into the airway with good air movement following this. This  was secured in place using Velcro straps. The patient was awakened from  anesthesia and allowed to ventilate spontaneously and then transferred to  recovery in stable condition.      JHR/MEDQ  D:  09/02/2004  T:  09/02/2004  Job:  [id number]

## 2010-10-18 NOTE — H&P (Signed)
[name], [name]               ACCOUNT NO.:  [id number]   MEDICAL RECORD NO.:  [id number]          PATIENT TYPE:  INP   LOCATION:  A218                          FACILITY:  [facility]   PHYSICIAN:  Margaretmary Dys, M.D.DATE OF BIRTH:  Feb 20, 1951   DATE OF ADMISSION:  05/07/2004  DATE OF DISCHARGE:  LH                                HISTORY & PHYSICAL   ADMISSION DIAGNOSIS:  1.  Hypoxic respiratory failure.  2.  Dyspnea.  3.  Mediastinal and hilar adenopathy, rule out sarcoid, rule out malignancy.  4.  Obesity hypoventilation syndrome.  5.  Morbid obesity.  6.  Chronic tobacco abuse/chronic obstructive pulmonary disease.  7.  Probable sleep apnea.  8.  Poorly controlled type 2 diabetes.   CHIEF COMPLAINT:  Dyspnea of one and a half weeks duration.   HISTORY OF PRESENT ILLNESS:  [name] is a 60 year old African-  American female with multiple medical problems who was in her usual state of  health until about one and a half weeks ago when she noticed a progressive  shortness of breath especially on mild exertion.  She said she is slightly  short of breath at baseline, but things really changed over the last one and  a half weeks to the point where she is barely able to ambulate across the  room without getting very lightheaded, short of breath, and diaphoretic.  She denies any associated chest pain.  She has not noticed any significant  cough although she thought in the beginning she had flu-like symptoms, but  that resolved with minimal cough and no fever or chills.  The patient is  having pleuritic chest pain.   She was planning to travel outside [place], but she was concerned and  decided to see Dr. Lodema Hong today prior to her departure.  When she was  evaluated by Dr. Lodema Hong, she was found to be hypoxic.  Blood gases were  also very concerning.  She was subsequently sent to the emergency room for  further evaluation.  Evaluation in the emergency room for  pulmonary embolism  was negative.  However, the patient had multiple hilar and mediastinal  lymphadenopathies which were pathologically enlarged on spiral CT scan of  the chest.  At this point, the patient denies any orthopnea or paroxysmal  nocturnal dyspnea.  She said her feet always swell on and off.  She takes  Lasix for that.  She denies any frequency, urgency, or dysuria.  She has had  no headache but sometimes has some lightheadedness especially while  ambulating.  She denies any weight loss.  She says she has gained a lot of  weight.  She denies any night sweats.  She has had no skin rashes.  She  denies any nausea, vomiting, diarrhea, or abdominal pain.   The patient has been admitted for further evaluation.  She required 3 L of  oxygen to keep her sats above 92%.   REVIEW OF SYSTEMS:  A 10 point review of systems was essentially negative  except as mentioned in the history of present illness above.   PAST  MEDICAL HISTORY:  1.  Dyslipidemia.  2.  Hypertension.  3.  Hypothyroidism.  4.  COPD.  5.  Asthma.  6.  Bronchitis.  7.  Diabetes mellitus type 2.   PAST SURGICAL HISTORY:  1.  She is status post partial thyroidectomy.  2.  Status post cholecystectomy.  3.  Status post hysterectomy.  4.  Status post tonsillectomy.   MEDICATIONS:  1.  Lisinopril.  2.  Atenolol 100 mg p.o. daily.  3.  Norvasc 10 mg p.o. daily.  4.  Nexium 40 mg p.o. daily.  5.  Amaryl 4 mg p.o. daily.  6.  Crestor 1 daily.  7.  Aspirin 325 mg p.o. daily.  8.  Lasix 40 mg p.o. daily.  9.  Potassium chloride 20 mEq p.o. daily.  10. Xanax 0.25 mg p.o. b.i.d.  11. Lantus insulin 70 to 80 units subcu q.h.s.  12. Synthroid 50 mcg p.o. daily.   ALLERGIES:  CODEINE.  She gets GI upset with it.   FAMILY HISTORY:  Significant for cancer with dad having had throat cancer.  He died from it.  He smoked and also had thyroid cancer.  There is no family  history of sarcoid.  Her other siblings are doing  very well.   SOCIAL HISTORY:  The patient is married and has no children.  She had a PPD  done a year ago which was negative.  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   She smokes about one and a half to two packs a day and has been smoking for  close to 40 years now.  She denies any alcohol and denies any IV drug abuse.   PHYSICAL EXAMINATION:  GENERAL:  Shows conscious, alert, in mild respiratory  distress.  Well oriented to time, place, and person.  VITAL SIGNS:  Blood pressure was 147/73, pulse of 74, respiratory rate 24,  temperature 98.3 degrees Fahrenheit, oxygen sats were 98% on 3 L.  She said  she had no pain.  HEENT:  Normocephalic, atraumatic.  Oral mucosa was moist with no exudates.  NECK:  Obese but supple.  No lymphadenopathies are palpable.  LUNGS:  Marked reduced air entry bilaterally probably secondary to body  habitus.  I did not perceive any wheezing, rhonchi, or crackles.  HEART:  S1 and S2 regular.  No S3, S4, gallops, or rubs.  ABDOMEN:  Soft, not tender.  It was obese.  Bowel sounds are positive.  No  masses palpable.  EXTREMITIES:  Bilateral pitting pedal edema.  Evidence of chronic stasis  dermatitis.  CNS exam was grossly intact with no focal deficits.   LABORATORY/DIAGNOSTIC STUDIES:  White blood cell count was 11.2, hemoglobin  14.3, hematocrit 43.5, platelet count 342.  There was no left shift.  D-  dimer was noted to be 0.65.  Sodium was 136, potassium 4.1, chloride at 100,  carbon dioxide 31, glucose 222, BUN of 15, creatinine 1, calcium was 9.3.  CT scan of the chest with contrast showed multiple enlarged hilar and  mediastinal lymph nodes.  The patient had evidence of COPD with  peribronchial thickening.  There was no evidence of pulmonary embolism.  The patient also had a mosaic ground glass spot in the lower lungs, suspected   either small airway or small vessel disease.   ASSESSMENT/PLAN:  [name] is a 60 year old female  with multiple  medical issues presenting with:  1.  Dyspnea plus hypoxic respiratory failure.  The cause of this remains      unclear.  This has been progressive over one and a half weeks.  CT scan      of the chest for pulmonary embolism was negative.  Multiple probable      etiologies include obesity hypoventilation syndrome; however, this would      be a diagnosis of exclusion if we cannot find a definite pathology.      More concerning, there is a probability of sarcoid especially with the      patient's lymph nodes and bilateral small air bilateral airway disease      seen on CT scan.  The patient will be admitted to 2A , ill obtain a      pulmonary consult.  The patient will also have a pulmonary function test      to evaluate for evidence of restrictive obstructive airway disease.  The      patient does have a history of asthma, and she is on a beta blocker, but      she has been on this for a long time, and she does not seem to be      reactive to this.  The patient currently does not have any wheezing, and      she states that she does not use albuterol that frequently.  I will      continue to monitor her closely for any evidence of reactive airway      disease especially with her beta blocker.  2.  Chronic obstructive pulmonary disease.  The patient currently is not      wheezing but has markedly reduced air entry bilaterally.  She has      significant smoking history.  The emphysema on CT scan does seem to be      very impressive to the point of solely accounting for her hypoxemia.  We      will put her on nicotine patch if it is okay with her.  3.  Sleep apnea.  The patient gives a history of not sleeping well at night.      She also has witnessed snoring and witnessed apneas.  She has excessive      daytime drowsiness.  The patient's weight and neck circumference are       very consistent with possibility of a sleep apnea.  Sleep apnea may      predispose out to some kind of pulmonary hypertension which may be      causing the hypoxia at this time.  The patient needs to be evaluated      with a full sleep study.  There is a good chance that the patient will      desaturate on a nocturnal oximetry.  She probably will need oxygen even      with CPAP.  4.  Poorly controlled type 2 diabetes mellitus.  Put patient on a sliding-      scale and resume Lantus insulin use at home.   DISPOSITION:  The patient will be evaluated for reasons for her hypoxemic  respiratory failure.  Pulmonary consult is pending this morning.  We will  also obtain full PFTs on her.  Will switch her Lasix to IV for now although  I doubt congestive heart failure is contributing significantly to her  current status.     Ayor   AM/MEDQ  D:  05/08/2004  T:  05/08/2004  Job:  [id number]

## 2010-10-18 NOTE — Procedures (Signed)
AGE:  60.2[age]   INDICATIONS:  This was a portable EEG in a patient who has had respiratory  difficulties at the time of consideration for lymph node biopsy for enlarged  lymph nodes.   TECHNICAL DESCRIPTION:  This EEG was recorded during the awake state.  The  background activity shows 14 Hz rhythms with higher amplitude seen in the  posterior head regions bilaterally.  There is no evidence of any focal  asymmetry.  Drowsiness is present at which time there is some evidence of  bitemporal slowing into the beta range.  No epileptiform activity is seen.  Photic stimulation and hyperventilation testing were not performed.  There  was no evidence of any epileptiform activity.   IMPRESSION:  This is a normal EEG during the awake state with some mild  bitemporal slowing possibly secondary to drowsiness.      ZOX:WRUE  D:  06/18/2004 09:16:31  T:  06/18/2004 12:44:44  Job #:  [id number]

## 2010-10-18 NOTE — Consult Note (Signed)
[name], [name]               ACCOUNT NO.:  [id number]   MEDICAL RECORD NO.:  [id number]          PATIENT TYPE:  INP   LOCATION:  A218                          FACILITY:  [facility]   PHYSICIAN:  Edward L. Juanetta Gosling, M.D.DATE OF BIRTH:  1950-10-01   DATE OF CONSULTATION:  DATE OF DISCHARGE:                                   CONSULTATION   REASON FOR CONSULTATION:  Hypoxic respiratory failure.   HISTORY:  This is a 60 year old who has had about a 10 day history of  increasing shortness of breath.  She has been coughing to some extent as  well.  She is normally, to some extent, short of breath, but she has become  much worse.  She says that she developed bronchitis, though she got rid of  it and then it seemed to come back.  She has not had any definite fever or  chills.  She has had some pleuritic chest pain.  She normally sees Dr.  Syliva Overman.  She saw Dr. Lodema Hong, was hypoxic, sent to the emergency  room and workup in the emergency room showed no definite evidence of  pulmonary embolism, but hilar and mediastinal lymph nodes, which were  enlarged.   PAST MEDICAL HISTORY:  1.  Hyperlipidemia.  2.  Hypertension.  3.  Hypothyroidism.  4.  COPD.  5.  Asthma.  6.  Bronchitis.  7.  Diabetes mellitus type 2.   PAST SURGICAL HISTORY:  She has had:  1.  Thyroidectomy.  2.  Cholecystectomy.  3.  Hysterectomy.  4.  Tonsillectomy.   MEDICATIONS:  1.  Lisinopril, I do not know the dose.  2.  Atenolol 100 mg daily.  3.  Norvasc 10 mg daily.  4.  Nexium 40 mg daily.  5.  Amaryl 4 mg daily.  6.  Crestor, unknown dose, daily.  7.  Aspirin 325 mg daily.  8.  Lasix 40 mg daily.  9.  Kay Ciel 20 mEq daily.  10. Xanax 0.25 b.i.d. p.r.n.  11. Lantus insulin.  12. Synthroid.   FAMILY HISTORY:  Her father died from some sort of a head and neck cancer.   SOCIAL HISTORY:  She is married and has no children.  She works as a Engineer, civil (consulting).  She does smoke about one and a half to two packs of  cigarettes daily and has  been doing so for about 40 years.  She does not use alcohol or drugs.   PHYSICAL EXAMINATION:  GENERAL:  She is a mildly obese female who is in no  acute distress now.  She is sitting up, eating breakfast.  VITAL SIGNS:  Temperature 97.7; pulse 77; respirations 22; blood sugar 186;  blood pressure 125/59; O2 sat is 93% on 3 L; height 65; weight 322.8.  HEENT:  Her pupils are equal, round and reactive to light and accommodation.  Nose and throat are clear.  Mucous membranes are dry.  CHEST:  With some minimal wheeze on the left posteriorly.  Otherwise, fairly  clear.  HEART:  Regular without gallop.  ABDOMEN:  Soft without masses.  EXTREMITIES:  No edema.   Her blood gas does not have a PO2.  It does say see the note, but there is  no actual blood gas oxygen level available.  BMET today shows normal  electrolytes, glucose is 203.  BNP 40.3.  Sed rate 12.  CBC today shows  white count 13,400, hemoglobin is 14.3.  Her CT shows no pulmonary emboli.  There are several hilar and mediastinal lymph nodes.  There is evidence of  COPD, peribronchial thickening, also consistent with COPD.  There is  centrilobular emphysema.   ASSESSMENT:  She has hypoxemic respiratory failure.  She has a long smoking  history so there was concern about the possibility that this could represent  neoplasm as far as her abnormalities in her CT.  At this point I think we  should treat her for her respiratory failure, which is underway, with  continuation of her regular medications and the use of albuterol, Lasix.  I  would go ahead and probably add inhaled steroid.  Since she has had the  bronchitic symptoms, she should be on an antibiotic.  She is probably going  to end up needing to have a mediastinoscopy for diagnosis.   Thanks for allowing me to see her with you.     Edwa   ELH/MEDQ  D:  05/08/2004  T:  05/08/2004  Job:  [id number]

## 2010-10-18 NOTE — Discharge Summary (Signed)
[name], [name]               ACCOUNT NO.:  [id number]   MEDICAL RECORD NO.:  [id number]          PATIENT TYPE:  INP   LOCATION:  A308                          FACILITY:  [facility]   PHYSICIAN:  Calvert Cantor, M.D.     DATE OF BIRTH:  1950/08/19   DATE OF ADMISSION:  08/27/2004  DATE OF DISCHARGE:  03/30/2006LH                                 DISCHARGE SUMMARY   DISCHARGE DIAGNOSES:  1.  Shortness of breath secondary to mucus plugging.  2.  Tracheostomy secondary to a history of respiratory failure.  3.  Chronic obstructive pulmonary disease.  4.  Diabetes mellitus, insulin dependent.  5.  Hypothyroidism.  6.  Hypertension.  7.  Obesity.   DISCHARGE MEDICATIONS:  1.  Augmentin 875/125 mg b.i.d. for 7 days.  2.  Her home medications which are mentioned in her H&P.   HOSPITAL COURSE:  This is a 60 year old African American female who was  admitted for shortness of breath.  She was found to have some mucus  plugging; she was suctioned, which revealed large amounts of thick mucus; in  addition, she received Mucomyst to help break up her plugging and  guaifenesin.  She was seen by Dr. Gerilyn Pilgrim the next day, who changed her trach  tube to a slightly larger tube.  She recommended that [name] receive some  antibiotics due to some inflammation that she saw in her trachea.  The  following morning, she was seen by Dr. Juanetta Gosling as well, who is recommending  that she have a sleep study before her trach is removed, therefore on  discharge, appointments will be made for a sleep study and she also has an  appointment for an ENT in a few weeks.  She is going to be discharged on all  of her home medications with the addition of an antibiotic for 1 week.   She is stable currently.  She is not complaining of shortness of breath.  Her lungs are completely clear; she has not been coughing up any sputum.      SR/MEDQ  D:  08/29/2004  T:  08/29/2004  Job:  [id number]

## 2010-10-18 NOTE — Procedures (Signed)
[name], [name]               ACCOUNT NO.:  [id number]   MEDICAL RECORD NO.:  [id number]          PATIENT TYPE:  INP   LOCATION:  A218                          FACILITY:  [facility]   PHYSICIAN:  Edward L. Juanetta Gosling, M.D.DATE OF BIRTH:  03-17-1951   DATE OF PROCEDURE:  DATE OF DISCHARGE:                              PULMONARY FUNCTION TEST   1.  Spirometry shows a moderate to severe ventilatory defect without      definite air flow obstruction.  2.  Lung volumes show moderate reduction of the total lung capacity,      suggesting that the primarily abnormality is a restrictive change.  3.  DLCO is severely reduced.  4.  There is no significant bronchodilator effect.     Edwa   ELH/MEDQ  D:  05/08/2004  T:  05/09/2004  Job:  [id number]

## 2010-10-18 NOTE — Group Therapy Note (Signed)
[name], [name]               ACCOUNT NO.:  [id number]   MEDICAL RECORD NO.:  [id number]          PATIENT TYPE:  INP   LOCATION:  A218                          FACILITY:  [facility]   PHYSICIAN:  Edward L. Juanetta Gosling, M.D.DATE OF BIRTH:  23-Nov-1950   DATE OF PROCEDURE:  DATE OF DISCHARGE:                                   PROGRESS NOTE   SUBJECTIVE:  Ms. [name] says she is better today.  She remains hypoxic  with a PO2 that is fairly low despite the use of supplemental oxygen.  She  has no other new complaints.  She says that she feels a little better.  She  is coughing up some sputum so everything looks a little better as far as  that goes.   PHYSICAL EXAMINATION:  CHEST:  Her chest is clearer than it has been.  HEART:  Her heart is regular.  ABDOMEN:  Soft.  EXTREMITIES:  No edema.  CENTRAL NERVOUS SYSTEM:  Grossly intact.   ASSESSMENT AND PLAN:  She says that she thinks she had a previous CT chest  done so I am going to see if we can find that, and compare her current CT  chest to that CT chest, and see if it is any different.  Depending on the  results of that, she may not have to have further evaluation of her  abnormalities seen on CT of her chest.     Edwa   ELH/MEDQ  D:  05/09/2004  T:  05/09/2004  Job:  [id number]

## 2010-10-18 NOTE — Discharge Summary (Signed)
[name], [name]               ACCOUNT NO.:  [id number]   MEDICAL RECORD NO.:  [id number]          PATIENT TYPE:  INP   LOCATION:  A225                          FACILITY:  [facility]   PHYSICIAN:  Lonia Blood, M.D.      DATE OF BIRTH:  July 06, 1950   DATE OF ADMISSION:  12/06/2004  DATE OF DISCHARGE:  07/08/2006LH                                 DISCHARGE SUMMARY   PRIMARY CARE PHYSICIAN:  Milus Mallick. Lodema Hong, M.D.   DISCHARGE DIAGNOSES:  1.  Respiratory distress and hypoxia due to mucus plugging.  2.  Chronic obstructive pulmonary disease.  3.  Diabetes.  4.  Hypothyroidism.  5.  Hypertension.  6.  Morbid obesity.  7.  Dyslipidemia.  8.  Depression/anxiety.  9.  Generalized debilitation.   DISCHARGE MEDICATIONS:  1.  Nexium 40 mg daily.  2.  Catapres-TTS two weekly.  3.  Atrovent nebulizer 0.5 mg q.4h.  4.  Albuterol nebulizer 2.5 mg q.4h.  5.  Mucomyst nebulizer 6-10 mg t.i.d.  6.  Kay Ciel 20 mEq/L b.i.d.  7.  Lisinopril/hydrochlorothiazide 20/25 one tablet daily.  8.  Ambien 10 mg q.h.s.  9.  Synthroid 50 mcg daily.  10. Lasix 40 mg daily.  11. Xanax 0.5 mg q.i.d.  12. Crestor 20 mg q.h.s.  13. Aspirin 325 mg daily.  14. Amaryl 8 mg p.o. daily.  15. Wellbutrin XL 150 mg daily.  16. Lantus insulin 30 units in the morning and 35 units in the evening      subcutaneous.  17. Vicodin 5/500 one tablet three times a day.   DISPOSITION:  The patient is currently awake.  She is having difficulty  taking care of her tracheostomy at home.  She has no health aide.  She has  two sisters who live over 10 miles away from her and come around from time  to time to help her.  She has indicated that she is unable to take adequate  care of herself at home and will therefore place her in a nursing home for  skilled nursing at least temporarily until a decision is raised regarding  her tracheostomy.  She is doing well now on 50% oxygen through tracheostomy  with saturations ranging in  high 90s to 100.   PROCEDURE:  Chest x-ray performed on December 06, 2004, that shows cardiomegaly,  vascular congestion and mild interstitial edema.  There was background  chronic peribronchial interstitial changes and chronic obstructive pulmonary  disease.   CONSULTATIONS:  None.   HISTORY OF PRESENT ILLNESS:  Please refer to my dictated History and  Physical.  In short, however, the patient is a 60 year old female with  recurrent admission secondary to obstructive sleep apnea and vocal cord  polyps who has had tracheostomy since December 2005.  The patient has had  multiple problems with her tracheostomy care and has had revision twice.  She has been self-maintained on her tracheostomy at home, but this has  gradually proven more difficult for her.  She was brought in by EMS  yesterday secondary to severe hypoxia with her saturations 76% when  they  arrived.  The patient says she has tried to use her Mucomyst as well as  albuterol and Atrovent nebulizers, but with no help.  She felt like she was  choking to death and she called the EMS.  In the ED, she had adequate  tracheostomy care and has since improved.  Due to patient's inability to  care of herself and her reporting that her nebulizer machine has broken  down, we put her on 23-hour observation in the hospital and also initiated  placement for the patient.  At this point, she has not received any  placement offers.  If she is stable, we may discharge her home pending those  offers.  Once, she gets an offer we would move her into the nursing  facility.   HOSPITAL COURSE:  PROBLEM 1.  HYPOXIA:  The patient has done well with her  hypoxia which has resolved with her tracheostomy care.  It seems like what  she had was mucus plug.  This has been unplugged already and she continues  to use the Mucomyst in addition to the albuterol and Atrovent nebulizers.   Problem 2.  CHRONIC OBSTRUCTIVE PULMONARY DISEASE:  This has been stable  with  only mild wheezing.  She is not in any COPD exacerbation.   Problem 3.  DIABETES:  The patient's CBGs range from 124-136.  We placed her  on her home regimen as indicated above.  Her hemoglobin A1c, however, was  8.5 indicating some uneven control at home.  She will therefore need some  tighter control.   Problem 4.  HYPOTHYROIDISM:  Her TSH was 1.976 which is fine at this point.  The patient can continue with the current dose of Synthroid.   Problem 5.  HYPERTENSION:  Her blood pressure is a little bit low this  morning at 90/63, however, for the most part her blood pressure has been  fine.  Will make adjustments to antihypertensives to make sure her blood  pressure does not remain low.   Problem 6.  DYSLIPIDEMIA:  She is currently on Lipitor and we will maintain  her on that.   Problem 7.  DEPRESSION/ANXIETY:  She is taking Wellbutrin and this seems to  work well for her.      LG/MEDQ  D:  12/07/2004  T:  12/07/2004  Job:  [id number]

## 2010-10-18 NOTE — Group Therapy Note (Signed)
[name], [name]               ACCOUNT NO.:  [id number]   MEDICAL RECORD NO.:  [id number]          PATIENT TYPE:  INP   LOCATION:  A218                          FACILITY:  [facility]   PHYSICIAN:  Edward L. Juanetta Gosling, M.D.DATE OF BIRTH:  December 20, 1950   DATE OF PROCEDURE:  05/10/2004  DATE OF DISCHARGE:                                   PROGRESS NOTE   SUBJECTIVE:  [name] says she is feeling better and indeed she does  look better today.  She has no new complaints.   PHYSICAL EXAMINATION:  VITAL SIGNS:  Temperature 99.4, pulse 75,  respirations 20, blood sugar 131, blood pressure 128/71, O2 saturation 95%  on two liters.  CHEST:  Her chest is much clearer.  She looks more comfortable.   ASSESSMENT:  I reviewed the CT and it does not show adenopathy that is seen  on the current CT so with this being a change she is going to end up needing  to have some sort of evaluation of this probably a mediastinoscopy for  biopsy.  She will probably need O2 at home.     Edwa   ELH/MEDQ  D:  05/10/2004  T:  05/10/2004  Job:  [id number]   cc:   Vania Rea, M.D.

## 2010-10-18 NOTE — H&P (Signed)
NAMEMARLAINA, [name]               ACCOUNT NO.:  [id number]   MEDICAL RECORD NO.:  [id number]          PATIENT TYPE:  INP   LOCATION:  A225                          FACILITY:  [facility]   PHYSICIAN:  [name], M.D.      DATE OF BIRTH:  Jul 29, 1950   DATE OF ADMISSION:  12/06/2004  DATE OF DISCHARGE:  LH                                HISTORY & PHYSICAL   PRIMARY CARE PHYSICIAN:  Dr. Syliva [name]   PRESENTING COMPLAINT:  Respiratory distress.   HISTORY OF PRESENT ILLNESS:  The patient is a 60 year old female well-known  to our service with history of obstructive sleep apnea and vocal cord polyps  who has been trached since December of last year.  Patient has had problems  with her trach before but she has been self administering her trach at home.  She came in today secondary to severe respiratory distress that started last  night.  She has made all attempts to give herself her nebulizers which  consist of albuterol, Atrovent, and Mucinex.  Per patient her machine  apparently failed.  She then became very much distressed and called EMS.  Per EMS patient was found to have very thick mucous plug in her trach.  Her  saturations when they arrived were 76%.  Hence, they brought her into the  hospital.  Patient complained that she has been unable to take care of  herself and her trach at home and she has been so scared.  She has had  feeling of impending choking or death and so she is very much worried.  Her  last admission on August 27, 2004 was similar to this.  She is now more  amenable and more interested in being transferred, if possible, to a long-  term or short-term care facility for her trach care.   PAST MEDICAL HISTORY:  1.  Severe obstructive sleep apnea with vocal cord polyps.  She was trached      some time in December to January 2006.  That was revised in January 2006      and also in April.  Dr. Serena [name] has been managing patient's trach.  2.  COPD.  3.   Insulin-dependent diabetes mellitus.  4.  Hypothyroidism.  5.  Hypertension.  6.  Obesity.  7.  Dyslipidemia.  8.  Depression and anxiety.   PAST SURGICAL HISTORY:  1.  Tracheostomy.  2.  History of thyroidectomy for thyroid growth.  3.  History of tonsillectomy.  4.  Appendectomy.  5.  Total abdominal hysterectomy and bilateral salpingo-oophorectomy.   ALLERGIES:  Patient has no known drug allergies.   MEDICATIONS:  1.  Nexium 40 mg daily.  2.  Catapres-TTS 1 q.weekly.  3.  Atrovent, albuterol, and Mucomyst nebulizers.  4.  KCl 20 mEq per L p.o. b.i.d.  5.  Lisinopril/hydrochlorothiazide 20/25 one tablet daily.  6.  Ambien 10 mg p.o. q.h.s.  7.  Synthroid 50 mcg p.o. daily.  8.  Lasix 40 mg p.o. daily.  9.  Xanax 0.5 mg p.o. q.i.d.  10. Crestor 20 mg p.o. q.h.s.  11. Aspirin 325 mg p.o. daily.  12. Amaryl 8 mg p.o. daily.  13. Wellbutrin XL 150 mg daily.  14. Lantus insulin 30 units in the morning and 35 units in the evening      subcutaneously.  15. Vicodin 5/500 one to three tablets three times a day.   SOCIAL HISTORY:  Patient currently lives at home.  She has two sisters that  take turns to help her but she has been unable to take good care of herself  and they have also not been able to do that.  She has previously smoked  about 30 packs a year.  However, patient has stopped smoking since last  year.  She denied any alcohol intake.   FAMILY HISTORY:  Significant for her father that has throat cancer.  Her  mother had diabetes.  One of her aunts had thyroid cancer.   REVIEW OF SYSTEMS:  A 10-point review of system is essential as in HPI.  Patient denied any fever, denied any shaking except for cough that came in  with her current symptoms.   PHYSICAL EXAMINATION:  VITAL SIGNS:  Temperature 97.8, [name] pressure  123/66, pulse 104, respiratory rate 24, saturations 100% on room air.  GENERAL:  She is obese, alert, oriented, communicating well.  HEENT:  PERRL.   EOMI.  NECK:  Supple with a trach in place.  No visible JVD.  No palpable  lymphadenopathy.  CHEST:  Good air entry.  No wheezes or rales.  Patient is currently on 100%  oxygen attached to the trach.  CARDIOVASCULAR:  She is tachycardic.  ABDOMEN:  Obese, soft, nontender with positive bowel sounds.  EXTREMITIES:  Patient has no edema, cyanosis, or clubbing.   LABORATORIES:  Sodium 137, potassium 4.3, chloride 101, CO2 30, glucose 143,  BUN 24, creatinine 1.3, calcium 9.5.  White count 11.8, hemoglobin 11.4,  platelet count 395 with ANC of 4.8.  Her initial gas shows pH 7.32, pCO2 56,  pO2 153, bicarbonate 28.5 with oxygen saturation 99.3.  Chest x-ray  essentially shows no acute disease.  Her EKG is mainly sinus tachycardia.   ASSESSMENT:  This is a 60 year old female with known obstructive sleep apnea  who has a trach in place that is currently temporary.  She is here secondary  to severe respiratory distress.  It appears to be secondary to some mucous  plugging.  Patient was, however, hypoxic at home and it seems like she is  unable to take care of herself and her sisters were also unable to give her  the kind of care she needs.   PLAN:  1.  Respiratory distress.  Patient is currently much better after deplugging      her mucous plug in the ED.  She is, however, at risk for recurrent      hypoxia.  I will put her in for 24-hour observation.  In the meantime,      we will investigate her home nebulizer machine since she believes this      was not working.  If that is not working will have her replace it prior      to discharge and will encourage her to continue to use the Mucomyst at      home and will use it while in the hospital.  Will also explore the      option of probably placing patient somewhere else where she can get      adequate trach care since she is unable  to give herself good care at     home.  Meanwhile, will continue with the nebulizers as previously      stated.   2.  COPD.  This seems to be stable now.  Patient is not wheezing.  Will,      however, continue with the nebulizers, but no steroids.  3.  Diabetes.  Patient is currently on Amaryl and Lantus.  Will add sliding      scale insulin, follow her CBGs q.a.c. and q.h.s. and also check      hemoglobin A1C once more.  Will make further adjustment as needed in the      hospital.  4.  Hypothyroidism.  Will recheck her TSH and continue her home dose of      Synthroid.  5.  Hypertension.  Patient is already on clonidine and lisinopril/HCTZ.      Will continue these two medications while she is in the hospital.  6.  Obesity.  Per patient she has lost 70 pounds already and that was part      of her pact with Dr. Pollyann Kennedy prior to making a permanent decision      regarding her trach.  Her goal is to lose 150 pounds so she is already      working on that and we recounseled her again.  7.  Dyslipidemia.  I will continue with her home dose of Statin.  8.  Depression and anxiety.  I will continue with her medications prior to      admission.  9.  Generalized debilitation.  As indicated, will proceed with social worker      consult so we can have patient placed either in a skilled nursing      facility or long-term care facility depending on what type insurance      will allow.       LG/MEDQ  D:  12/06/2004  T:  12/06/2004  Job:  [id number]   cc:   Milus Mallick. Lodema Hong, M.D.  [address]  [place], [place] [address]  Fax: [phone]

## 2010-10-18 NOTE — Discharge Summary (Signed)
[name], [name]               ACCOUNT NO.:  [id number]   MEDICAL RECORD NO.:  [id number]          PATIENT TYPE:  IPS   LOCATION:  4035                         FACILITY:  [facility]   PHYSICIAN:  Ellwood Dense, M.D.   DATE OF BIRTH:  [birth year]   DATE OF ADMISSION:  06/28/2004  DATE OF DISCHARGE:  07/05/2004                                 DISCHARGE SUMMARY   DISCHARGE DIAGNOSIS:  1.  Respiratory failure with anoxic brain injury symptoms back to baseline.  2.  Diabetes mellitus type 2.  3.  Morbid obesity.  4.  Gastroesophageal reflux disease.  5.  Hypothyroidism.   HISTORY OF PRESENT ILLNESS:  Ms. Veillon is a 60 year old female with a  history of morbid obesity, hypercarbic, hypoxic respiratory failure with  chronic O2, noted to have hilar adenopathy and was admitted to [facility] January 12 for biopsy.  Attempts to intubate the patient were  unsuccessful with the patient requiring emergent trach in the OR with CPR 5-  10 minutes and question of anoxic brain injury secondary to lethargy.  Neurology was consulted and has been following along.  The patient's trach  was revised on January 14 and once the patient was extubated, she was noted  to be following commands without difficulty with excellent neurologic  recovery per Dr. Anne Hahn.  EEG done was noted to be normal.  A study was done  prior to initiating diet and abnormality was noted along vocal cords.  ENT  was consulted for input and fiberoptic study done by Dr. Donnie Coffin showed old  granuloma with some ecchymosis.  She was cleared to start p.o.  The patient  continues with #7 trach and is to be down sized to #6 extra long that is  currently unavailable.  She also continues on steroid taper.  The trials are  ongoing for a few hours at a time without dyspnea.  The patient was  mobilized and she is currently noted to require min assist with transfers,  min assist ambulating 80 feet with set up to mod assist for ADLs.  CIR  was  consulted for progression.   PAST MEDICAL HISTORY:  Significant for obesity, sleep apnea, hypertension,  hypothyroid, history of thyroid mass resected,  hysterectomy, appendectomy,  breast biopsy, GERD.   ALLERGIES:  Codeine.   FAMILY HISTORY:  Positive for cancer.   SOCIAL HISTORY:  The patient is married, lives in a one level home with ten  steps to enter, was working as a Engineer, civil (consulting) prior to admission.  She does not use  alcohol, has decreased tobacco use to 5-6 cigarettes per day.   HOSPITAL COURSE:  Ms. Cavitt was admitted to rehab on June 28, 2004,  for inpatient therapies to consist of PT, OT, and speech therapy daily.  Past admission, the patient tolerated being partially plugged with Passy-  Muir  trial.  She was initially maintained on humidified air.  This was  discontinued by the time of discharge.  She was also noted to have an  excoriated area under her trach flange that was initially treated with  padding with  Restore.  CBGs were monitored on a.c. and h.s. basis and the  patient was maintained on Lantus insulin b.i.d.  Blood pressure was  initially noted to be elevated.  Steroid was slowly tapered off by the time  of discharge.  Blood sugars at discharge were improved from 90s to  occasional high in 240s.  The patient was instructed regarding CBG  monitoring and p.m. dose Lantus was introduced at 35 units in the p.m.  The  patient's #6 trach was not available until the day of discharge.  On  February 3, Dr. Pollyann Kennedy evaluated the patient and trach changed out without  any difficulty.  He is to follow up with him in one month regarding further  evaluation.  The patient was also advised regarding weight loss of 50 to 100  pounds prior to trach removal to prevent recurrent respiratory failure.  During her stay in rehab, Ms. Northway progressed along well.  She was  modified independent level for transfers, modified independent level with  ambulating 200 feet with a  rolling walker.  She required set up for ADLs.  She was modified independent for toileting as well as home management care.  Speech therapy evaluation showed the patient's high level of comprehension  expression to be intact, short term memory was intact without any difficulty  in comprehension or reading or writing.  No dysarthria noted.  The patient  will continue to receive follow up with Home Health PT and OT.  Home Health  RN to monitor trach care.  On July 05, 2004, the patient is discharged to  home.   DISCHARGE MEDICATIONS:  Advair 250/50 2 puffs b.i.d., DuoNeb's q.i.d.,  Lisinopril hydrochlorothiazide 20 25 1  per day, Levothyroxine 50 mcg daily,  Crestor 20 mg q.h.s., Amaryl 4 mg b.i.d., Nexium 40 mg daily, Catapres TTS  one patch weekly change on Mondays, Lantus insulin 30 units in the a.m., 35  units in the p.m., K-Dur 40 mEq daily.   DISCHARGE INSTRUCTIONS:  Activities:  As tolerated, continue with trach  collar.  Diet is diabetic diet.  Wound care:  Trach care with apply  Bacitracin and large Band-Aid.  Check blood sugars on a b.i.d. to t.i.d.  basis.  No alcohol, no smoking, no driving.  The patient is to follow up  with Dr. Shellee Milo on February 21 at 4:20 p.m.  Follow up with Dr. Donnie Coffin in one  month.  Follow up with Dr. Lodema Hong for routine check.      PP/MEDQ  D:  07/15/2004  T:  07/15/2004  Job:  [id number]   cc:   Marlan Palau, M.D.  [address]  [address]  [place]  [place] [address]  Fax: [phone]   Shawnie Dapper, M.D.  [address]  [place]  [place] [address]  Fax: [phone]   Dr. Shellee Milo

## 2010-10-18 NOTE — Discharge Summary (Signed)
[name], [name]               ACCOUNT NO.:  [id number]   MEDICAL RECORD NO.:  [id number]          PATIENT TYPE:  INP   LOCATION:  A218                          FACILITY:  [facility]   PHYSICIAN:  Vania Rea, M.D. DATE OF BIRTH:  [birth year]   DATE OF ADMISSION:  05/07/2004  DATE OF DISCHARGE:  12/09/2005LH                                 DISCHARGE SUMMARY   PRIMARY CARE PHYSICIAN:  Milus Mallick. Lodema Hong, M.D.   CONSULTS THIS ADMISSION:  Oneal Deputy. Juanetta Gosling, M.D., pulmonologist.   DISCHARGE DIAGNOSIS:  1.  Hypercarbic hypoxic respiratory failure.  2.  Restrictive airways disease.  3.  Morbid obesity.  4.  Obesity hyperventilation syndrome.  5.  Probable sleep apnea.  6.  Mediastinal and hilar lymphadenopathy, rule out sarcoid, rule out      malignancy.  7.  Chronic tobacco abuse.  8.  Diabetes type 2, poorly controlled.  9.  Hypertension, controlled.  10. History of gastroesophageal reflux disease.  11. History of hypothyroidism.  12. History of hyperlipidemia.   DISPOSITION: To Home.   CONDITION: Stable   MEDICATIONS:  Levaquin 750 mg daily  Norvasc 10mg  daily  Atenolol 100mg  daily  Crestor one daily  ASA 325 mg daily  Lasix 40mg  daily  K-dur daily  Synthroid daily  Nexium 40mg  daily  Xanax 0.25mg  bid  Advair 250/50 bid  Albuterol/Atrovent q4h prn   Home O2, 3L/min by nasal cannula   HOSPITAL COURSE:  Please refer to admission history and physical.  This is a  60 year old morbidly obese African-American lady who went to her physician's  office for a routine check prior to going on a trip, and was found to be  markedly hypoxic and sent for a CT scan of the chest.  CT scan revealed  mediastinal and hilar lymphadenopathy.  The patient was admitted for further  management of her hypoxia.   The patient had the benefit of an evaluation by the pulmonologist, Dr.  Kari Baars, and pulmonary function tests were ordered.  Pulmonary  function tests  suggested severe restrictive airways disease with increased  DLCO, but there was no significant evidence of obstruction.  Later in the  hospital course, the patient admitted that for some time now she had been  using nebulizers of other family members because she has difficulty  breathing.  She says all of her family members have difficulty breathing,  and all of them smoke.  She also admitted that for the past 5 months her  husband has been telling her that she does not seem to be breathing well.   The patient, over the hospital course, had a mild leukocytosis and episodic  fevers, and was started on Levaquin antibiotics.  Getting oxygen and  nebulizations, and is now much improved.  At discharge, she is being sent  home on home oxygen, since, when she ambulates, her O2 saturations fall to  76%.   PHYSICAL EXAMINATION:  GENERAL:  This afternoon, the patient is alert and  oriented, and in no acute respiratory distress.  VITAL SIGNS:  Temperature of 97.8, pulse 65, respirations 20, blood  pressure  117/69.  Her fasting blood sugar was 131; prior to lunch is was 124; prior  to supper it was 107.  She is saturating at 94% on 2 liters, and 76%  ambulating on room air.  CHEST:  Clear to auscultation bilaterally.  She has prolonged expiration.  CARDIOVASCULAR:  Regular rhythm.  She has a loud P2.  ABDOMEN:  Morbidly obese but nontender.  EXTREMITIES:  Trace edema bilaterally.   LABORATORY DATA:  Her ABG yesterday on room air on admission revealed a pH  of 7.4, PCO2 of 47, PO2 of 46, saturating at 81%.  Yesterday on 2 liters, pH  was 7.3, PCO2 of 62, PO2 of 61, saturating at 88.5%.  Her white count this  morning has fallen to 11.1, hematocrit 42, platelets 248.  She has a normal  differential.  Her sodium is 139, potassium 3.9, chloride 101, CO2 of 32,  glucose 129, BUN 16, creatinine 1.2, calcium 9.0.  Her angiotensin  converting enzyme is in the normal range of 26.  Her B. natruretic  peptide  is normal at 40.  Her TSH is normal at 2.27.  Her ANA is negative.   FOLLOW UP:  The patient is to follow up with her primary care physician, Dr.  Syliva Overman, and also to follow up with Dr. Juanetta Gosling, pulmonologist -  both within a week or two.   SPECIAL INSTRUCTIONS:  The patient is to make arrangements with Dr. Juanetta Gosling  for mediastinoscopy for biopsy of lymph nodes.     Leop   LC/MEDQ  D:  05/10/2004  T:  05/11/2004  Job:  [id number]

## 2010-10-18 NOTE — Procedures (Signed)
[name], [name]               ACCOUNT NO.:  [id number]   MEDICAL RECORD NO.:  [id number]          PATIENT TYPE:  OUT   LOCATION:  RAD                           FACILITY:  [facility]   PHYSICIAN:  Edward L. Juanetta Gosling, M.D.DATE OF BIRTH:  1951-02-17   DATE OF PROCEDURE:  DATE OF DISCHARGE:                            PULMONARY FUNCTION TEST   1. Spirometry shows a mild ventilatory defect without definite airflow      obstruction.  2. Lung volumes show reduction in total lung capacity suggesting a      restrictive change.  3. DLCO is severely reduced.      Edward L. Juanetta Gosling, M.D.  Electronically Signed     ELH/MEDQ  D:  05/12/2006  T:  05/12/2006  Job:  [id number]   cc:   Milus Mallick. Lodema Hong, M.D.  Fax: [phone]

## 2010-10-18 NOTE — H&P (Signed)
[name], [name]               ACCOUNT NO.:  [id number]   MEDICAL RECORD NO.:  [id number]          PATIENT TYPE:  INP   LOCATION:  A220                          FACILITY:  [facility]   PHYSICIAN:  Osvaldo Shipper, MD     DATE OF BIRTH:  September 24, 1950   DATE OF ADMISSION:  08/11/2004  DATE OF DISCHARGE:  LH                                HISTORY & PHYSICAL   PRIMARY CARE PHYSICIAN:  Milus Mallick. Lodema Hong, M.D.   ADMISSION DIAGNOSES:  1.  Respiratory distress secondary to increased secretions.  2.  Status post tracheostomy done imminently for respiratory failure in Jan      2006.  3.  Chronic obstructive pulmonary disease.  4.  Obesity.  5.  Diabetes.  6.  Hypothyroidism.  7.  Hypertension.  8.  Lung mass under evaluation.   CHIEF COMPLAINT:  Respiratory distress.   HISTORY OF PRESENT ILLNESS:  The patient is a 60 year old African-American  female who presented to the emergency room with moderate respiratory  distress.  The patient was in [facility] back in January 2006 where  she went to undergo biopsy for apparent lung mass.  Because of obesity, the  patient was difficulty intubation which could not be done, and the patient  actually coded in the OR requiring emergency tracheostomy.  The patient has  had tracheostomy since then, and she follows with Dr. Pollyann Kennedy for the same.  Dr.  Edwyna Shell is her pulmonologist.   Ever since then, the patient has managed her secretions and has done self  suctions every few hours at home herself.  She mentioned that this morning  she noted that she was finding it difficult to breathe.  She called Dr.  Pollyann Kennedy, and one of the covering physicians told her to just increase  suctioning and to keep herself hydrated.  With these measures, however, the  patient did not improve, and she felt that she might go into severe  respiratory distress and, hence, came to the emergency room.   The patient denied any fever or chills at home prior to these episodes.   She  denied any chest pain at this time.  She mentioned that initially her  secretions were clear with some bloody streaks, but lately they have been  yellowish in color and very difficult to suction.  The patient wears oxygen  at home through a tracheostomy collar and also gives herself nebulizer  treatment every 6 hours or so.  Currently the patient feels that she is  doing better.  Respiratory therapists have been suctioning the patient with  saline flushes over the past three to four hours.   MEDICATIONS:  1.  Lisinopril HCT 20/25 one daily.  2.  Nexium 40 mg daily.  3.  Amaryl 4 mg daily.  4.  Lantus insulin 45 units in the morning, 50 units at night.  5.  Crestor, unknown dose.  6.  Aspirin 325 g daily.  7.  Lasix 20 mg daily.  8.  Potassium chloride 20 mEq daily.  9.  Catapres 0.2 mg patch applied weekly.  10. Xanax 0.25  mg b.i.d.  11. Darvocet and Vicodin on a p.r.n. basis.  12. Synthroid 50 mcg daily.  13. Advair 250/50 inhaled b.i.d.  14. Duoneb every 6 hours.  15. As mentioned, she uses oxygen at home through a tracheostomy collar at 8      liters per minute.   ALLERGIES:  The patient is allergic to CODEINE which causes GI intolerance.   PAST MEDICAL HISTORY:  1.  Morbid obesity.  2.  Diabetes for which she is on insulin.  3.  COPD.  4.  Hypertension.  5.  Hypothyroidism.  6.  Lung mass under evaluation.   PAST SURGICAL HISTORY:  1.  Recent tracheostomy in January 2006.  2.  Thyroidectomy for a thyroid growth done a few years ago.  3.  Tonsillectomy in the past.  4.  Appendectomy in the past.  5.  The patient also gives history of total abdominal hysterectomy with      bilateral salpingo-oophorectomy in the past.   SOCIAL HISTORY:  The patient lives with her husband.  Her sister is the most  supportive for this patient.  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  She quite smoking about three months ago and has  about a  30-pack-year history of smoking.  Denies any alcohol or any other  illicit drug use.   FAMILY HISTORY:  Significant for father who had throat cancer and mother  diabetes.  Her aunt had thyroid cancer.  Otherwise nothing of significance.   REVIEW OF SYSTEMS:  A 10-point Review of Systems was done which was negative  except as mentioned under HPI.   PHYSICAL EXAMINATION:  VITAL SIGNS:  Temperature 98.2 orally, blood pressure  140/81, heart rate 110 beats per minute, regular rate and rhythm 20 breaths  per minute.  O2 saturation about 94% on 35% tracheostomy collar.  GENERAL:  Morbidly obese female in no apparent distress.  HEENT:  No pallor or icterus.  Oral mucosa membranes moist.  No lesions are  seen.  NECK:  Tracheostomy tube seen, but no signs of skin inflammation or  irritation.  LUNGS: Clear to auscultation bilaterally with no rales, crackles, or  rhonchi.  CARDIOVASCULAR:  S1 and S2 normal regular.  No murmurs appreciated.  No  bruits heard.  No JVD seen.  ABDOMEN:  Very obese, however, nontender, nondistended.  No organomegaly  appreciated.  Bowel sounds are regular.  EXTREMITIES:  Show 2+ pitting edema bilaterally.  Peripheral pulses are  palpable.  NEUROLOGIC:  Grossly intact.   LABORATORY DATA AND OTHER STUDIES:  The patient has not had any blood work  or any admitting studies at this time.  We will be ordering them at this  time.   IMPRESSION:  This is a 60 year old African-American female with multiple  comorbidities and history significant for tracheostomy done emergently in  January 2006, who comes in with moderate respiratory distress secondary to  increased secretions through her tracheostomy.  The patient has been  afebrile, and her lungs are clear at this time. It does not appear the  patient has any pneumonia.   PLAN:  1.  Increased respiratory secretions:  We will have respiratory therapist do     flushed with saline flushes every hour. We will also have  them give      patient Mucomyst nebulizer treatment which should help with making the      secretions easier to suction.  Will continue the patient on her  nebulizer treatment.  No indication to start the patient on antibiotics      at this time; however, if chest x-ray suggests pneumonia, we will do the      same.  2.  Chronic obstructive pulmonary disease:  Will continue patient on      nebulizer treatment and continue her O2 therapy.  3.  Diabetes.  Will continue the patient on Lantus insulin and cover with      sliding scale with regular insulin.  Will put her on a diabetic diet.  4.  Hypertension:  Will continue her antihypertensive agents at this time.  5.  Hypothyroidism:  Will continue Synthroid.  Will check TSH and free T4      levels.  6.  Lung mass:  It is under evaluation by her pulmonologist.  This is not an      active issue at this time, and we will not pursue it during this      hospital stay unless something changes.  7.  Tomorrow morning, we will be contacting Dr.  Pollyann Kennedy, who is her ENT      specialist and decide further management with him.  I am anticipating      this patient will be discharged tomorrow provided her secretions have      all clear up.      GK/MEDQ  D:  08/11/2004  T:  08/11/2004  Job:  [id number]   cc:   Milus Mallick. Lodema Hong, M.D.  [address]  [place], [place] [address]  Fax: [phone]   Jeannett Senior. Pollyann Kennedy, MD  [phone]  [place] [address]  Fax: [phone]   Ines Bloomer, M.D.  [address]  [place]  [place] [address]

## 2010-10-18 NOTE — Consult Note (Signed)
NAMEMARIGOLD, MOM               ACCOUNT NO.:  [id number]   MEDICAL RECORD NO.:  [id number]          PATIENT TYPE:  OIB   LOCATION:  2116                         FACILITY:  [facility]   PHYSICIAN:  Marlan Palau, M.D.  DATE OF BIRTH:  May 21, 1951   DATE OF CONSULTATION:  06/15/2004  DATE OF DISCHARGE:                                   CONSULTATION   HISTORY OF PRESENT ILLNESS:  [name] is a 60 year old black female  born 11/15/1950 with a history of morbid obesity.  This patient has  sleep apnea and has had problems with hypercapnic respiratory failure prior  to this admission.  The patient was found to have mediastinal and hilar  adenopathy and was to be evaluated for this with mediastinoscopy and lymph  node biopsy.  The patient was coming in for this procedure and required an  emergency tracheostomy after intubation failed in the operating room.  The  patient required CPR for 5-10 minutes, required an emergency tracheostomy  which was successful and the patient was transferred to the intensive care  unit.  The patient initially was quite agitated requiring sedation following  the tracheostomy.  The patient underwent a tracheotomy today which was  performed.  Neurology was asked to see this patient for further evaluation  and possible anoxic injury to the brain.  The patient is on the ventilator  at this time.   PAST MEDICAL HISTORY:  1.  Status post tracheostomy secondary to failed intubation.  2.  Morbid obesity.  3.  Status post thyroid mass resection in the past.  4.  History of mediastinal and hilar adenopathy.  5.  Gastroesophageal reflux disease.  6.  Hypercholesterolemia.  7.  Hypothyroidism.  8.  Hypertension.  9.  Sleep apnea.  10. Hypercapnic respiratory failure prior to admission.   The patient has ALLERGY to CODEINE.   Does not smoke, just quit smoking in December of 2005.   MEDICATIONS PRIOR TO ADMISSION:  1.  Thyroxine 0.05 mg daily.  2.   Nexium 40 mg daily.  3.  Lantus insulin 50 units daily.  4.  Crestor 20 mg a day.  5.  Amaryl 4 mg 2 a day.  6.  Norvasc 10 mg daily.  7.  Diazepam 5 mg daily.  8.  Lisinopril/hydrochlorothiazide 20/25 mg daily.  9.  Lasix 40 mg daily.  10. Alprazolam 0.25 mg b.i.d.  11. Atenolol 100 mg a day.  12. Advair Diskus 250/50 mg 1 puff b.i.d.  13. The patient was also on aspirin 325 mg daily prior to admission.  14. The patient was taking potassium 20 mEq daily.   SOCIAL HISTORY:  This patient is married.  The patient [place] in the  [place] area.  Is not employed.   FAMILY MEDICAL HISTORY:  Notable for some problems with cancer in father,  otherwise family history cannot be obtained.   REVIEW OF SYSTEMS:  Is not obtainable.  The patient is intubated and sedated  at this time.   PHYSICAL EXAMINATION:  VITAL SIGNS:  Blood pressure is 132/74, heart rate  84, respiratory rate per vent 14, the patient has a temp of 99.5 orally.  GENERAL:  This patient is a morbidly obese black female who has a  tracheotomy tube on the ventilator at this time.  The patient is sedated,  partially paralyzed at the time of the examination.  RESPIRATORY:  Bilateral wheezes noted.  CARDIOVASCULAR:  Distant heart sounds.  No obvious murmurs or rubs noted.  ABDOMEN:  Markedly obese.  EXTREMITIES: 1-2+ edema at the ankles bilaterally.  NEUROLOGICAL:  Is limited at this time.  Pupils are pinpoint.  The patient  has depressed corneals at this time.  Minimal dolls eyes.  Disks are  difficult to visualize but appear to be flat.  Deep tendon reflexes are  depressed to absent throughout.  Toes neutral.  Motor tone depressed on all  fours, no response to pain.   Laboratory values notable for a white count of 12.1, hemoglobin of 10.9,  hematocrit 31.9, platelets 237.  Sodium 39, potassium 4.4, chloride 107, CO2  27, BUN 21, creatinine 1.1, glucose 186, calcium 8.2.  CBG is 234 at this  time.   Chest  x-ray reveals increased interstitial infiltrates consistent with mild  interstitial edema, stable right paratracheal soft tissue prominence  consistent with mediastinal adenopathy.   EKG reveals normal sinus rhythm with a sinus arrhythmia.  Normal EKG, heart  rate of 63.   IMPRESSION:  1.  History of failed intubation, possible mild anoxic encephalopathy.  2.  Morbid obesity.  3.  History of mediastinal adenopathy.   At this point in time, the patient is on the ventilator, is sedated and  partially paralyzed.  We will need to return at a later time to fully  evaluate the patient's clinical neurologic status.  The patient had reported  agitation shortly following the emergency tracheotomy.  This actually is a  good prognostic sign.  Chance of recovery has actually increased at this  time as noted.  We will proceed with further evaluation during this  hospitalization.   PLAN:  1.  Repeat neurologic examination at a later time.  2.  CT of the head when the patient is medically stable.  3.  EEG study, portable.   We will follow the patient's clinical course while in house.       CKW/MEDQ  D:  06/15/2004  T:  06/15/2004  Job:  [id number]   cc:   Ines Bloomer, M.D.  [address]  [place]  [place] [address]

## 2010-10-24 ENCOUNTER — Other Ambulatory Visit: Payer: Self-pay | Admitting: Family Medicine

## 2010-10-24 DIAGNOSIS — Z139 Encounter for screening, unspecified: Secondary | ICD-10-CM

## 2010-10-26 ENCOUNTER — Other Ambulatory Visit: Payer: Self-pay | Admitting: Family Medicine

## 2010-10-28 ENCOUNTER — Other Ambulatory Visit: Payer: Self-pay | Admitting: Family Medicine

## 2010-10-30 ENCOUNTER — Ambulatory Visit (HOSPITAL_COMMUNITY): Payer: Medicare Other

## 2010-11-05 ENCOUNTER — Other Ambulatory Visit: Payer: Self-pay | Admitting: Family Medicine

## 2010-11-05 ENCOUNTER — Ambulatory Visit (HOSPITAL_COMMUNITY): Payer: Medicare Other

## 2010-11-06 ENCOUNTER — Encounter: Payer: Self-pay | Admitting: Family Medicine

## 2010-11-06 ENCOUNTER — Other Ambulatory Visit: Payer: Self-pay | Admitting: Family Medicine

## 2010-11-06 LAB — BASIC METABOLIC PANEL
Calcium: 9 mg/dL (ref 8.4–10.5)
Creat: 0.94 mg/dL (ref 0.50–1.10)
Sodium: 144 mEq/L (ref 135–145)

## 2010-11-06 LAB — HEPATIC FUNCTION PANEL
AST: 16 U/L (ref 0–37)
Albumin: 3.8 g/dL (ref 3.5–5.2)
Alkaline Phosphatase: 86 U/L (ref 39–117)
Total Protein: 6.3 g/dL (ref 6.0–8.3)

## 2010-11-06 LAB — LIPID PANEL
Cholesterol: 109 mg/dL (ref 0–200)
HDL: 38 mg/dL — ABNORMAL LOW (ref >39)
Total CHOL/HDL Ratio: 2.9 Ratio
Triglycerides: 56 mg/dL (ref <150)

## 2010-11-06 LAB — HEMOGLOBIN A1C: Mean Plasma Glucose: 189 mg/dL — ABNORMAL HIGH (ref <117)

## 2010-11-11 ENCOUNTER — Encounter (HOSPITAL_COMMUNITY): Payer: Medicare Other | Admitting: Psychology

## 2010-11-12 ENCOUNTER — Encounter (INDEPENDENT_AMBULATORY_CARE_PROVIDER_SITE_OTHER): Payer: Medicare Other | Admitting: Psychology

## 2010-11-12 DIAGNOSIS — F4001 Agoraphobia with panic disorder: Secondary | ICD-10-CM

## 2010-11-12 DIAGNOSIS — F332 Major depressive disorder, recurrent severe without psychotic features: Secondary | ICD-10-CM

## 2010-11-25 ENCOUNTER — Encounter: Payer: Self-pay | Admitting: Family Medicine

## 2010-11-26 ENCOUNTER — Other Ambulatory Visit: Payer: Self-pay | Admitting: Family Medicine

## 2010-11-26 ENCOUNTER — Ambulatory Visit (INDEPENDENT_AMBULATORY_CARE_PROVIDER_SITE_OTHER): Payer: Medicare Other | Admitting: Family Medicine

## 2010-11-26 ENCOUNTER — Encounter: Payer: Self-pay | Admitting: Family Medicine

## 2010-11-26 VITALS — BP 134/88 | HR 88 | Resp 18 | Ht 64.0 in | Wt 286.8 lb

## 2010-11-26 DIAGNOSIS — M479 Spondylosis, unspecified: Secondary | ICD-10-CM

## 2010-11-26 DIAGNOSIS — E669 Obesity, unspecified: Secondary | ICD-10-CM

## 2010-11-26 DIAGNOSIS — Z139 Encounter for screening, unspecified: Secondary | ICD-10-CM

## 2010-11-26 DIAGNOSIS — E039 Hypothyroidism, unspecified: Secondary | ICD-10-CM

## 2010-11-26 DIAGNOSIS — I10 Essential (primary) hypertension: Secondary | ICD-10-CM

## 2010-11-26 DIAGNOSIS — F329 Major depressive disorder, single episode, unspecified: Secondary | ICD-10-CM

## 2010-11-26 DIAGNOSIS — E785 Hyperlipidemia, unspecified: Secondary | ICD-10-CM

## 2010-11-26 MED ORDER — TIZANIDINE HCL 4 MG PO TABS: 4.0000 mg | ORAL_TABLET | Freq: Three times a day (TID) | ORAL | Status: DC

## 2010-11-26 MED ORDER — ZOLPIDEM TARTRATE 10 MG PO TABS: 10.0000 mg | ORAL_TABLET | Freq: Every evening | ORAL | Status: DC | PRN

## 2010-11-26 MED ORDER — FUROSEMIDE 40 MG PO TABS: 40.0000 mg | ORAL_TABLET | Freq: Every day | ORAL | Status: DC

## 2010-11-26 MED ORDER — MELOXICAM 15 MG PO TABS: 15.0000 mg | ORAL_TABLET | Freq: Every day | ORAL | Status: DC

## 2010-11-26 MED ORDER — ALPRAZOLAM 1 MG PO TABS: 1.0000 mg | ORAL_TABLET | Freq: Four times a day (QID) | ORAL | Status: DC

## 2010-11-26 MED ORDER — HYDROCODONE-ACETAMINOPHEN 10-500 MG PO TABS: 1.0000 | ORAL_TABLET | Freq: Four times a day (QID) | ORAL | Status: DC

## 2010-11-26 MED ORDER — CLONIDINE HCL 0.1 MG PO TABS: 0.1000 mg | ORAL_TABLET | Freq: Every day | ORAL | Status: DC

## 2010-11-26 NOTE — Patient Instructions (Addendum)
F/u in 3.5 months.  You need to go to individual diabetic class as well as to the group  Class  We will send for a new meter for you  Mammogram past due we will schedule  pls start glipizide 5mg  one daily, and take the lantus  Dose increase on zannaflex   hBA1c , chem 7, tsh in 3.5 months

## 2010-11-29 ENCOUNTER — Ambulatory Visit (HOSPITAL_COMMUNITY): Admission: RE | Admit: 2010-11-29 | Payer: Medicare Other | Source: Ambulatory Visit

## 2010-12-05 NOTE — Assessment & Plan Note (Signed)
Controlled, no change in medication  

## 2010-12-05 NOTE — Progress Notes (Signed)
  Subjective:    Patient ID: [name], female    DOB: [birth year], 60 y.o.   MRN: [id number]  HPI HYPERTENSION Disease Monitoring Blood pressure range-110 to 120 /70 Chest pain- no      Dyspnea- yes Medications Compliance- good Lightheadedness- no   Edema- no   DIABETES Disease Monitoring Blood Sugar ranges-130 to 170 fasting Polyuria- no New Visual problems- no Medications Compliance- poor Hypoglycemic symptoms- no   HYPERLIPIDEMIA Disease Monitoring See symptoms for Hypertension Medications Compliance- good RUQ pain- no  Muscle aches- no  Pt c/o increased and uncontrolled muscle spasms and wants medication for this  Review of Systems Denies recent fever or chills. Denies sinus pressure, nasal congestion, ear pain or sore throat. Denies chest congestion, productive cough or wheezing. Denies chest pains, palpitations, paroxysmal nocturnal dyspnea, orthopnea and leg swelling Denies abdominal pain, nausea, vomiting,diarrhea or constipation.  Denies rectal bleeding or change in bowel movement. Denies dysuria, frequency, hesitancy or incontinence.  Denies headaches, seizure, numbness, or tingling. Denies uncontrolled  depression, anxiety or insomnia. Denies skin break down or rash.        Objective:   Physical Exam Patient alert and oriented and in no Cardiopulmonary distress.  HEENT: No facial asymmetry, EOMI, no sinus tenderness, TM's clear, Oropharynx pink and moist.  Neck supple no adenopathy.  Chest: Clear to auscultation bilaterally.Decreased air entry throughout  CVS: S1, S2 no murmurs, no S3.  ABD: Soft non tender. Bowel sounds normal.  Ext: No edema  MS: Adequate ROM spine, shoulders, hips and knees. Skin: Intact, no ulcerations or rash noted.  Psych: Good eye contact, normal affect. Memory loss, not anxious or depressed appearing.  CNS: CN 2-12 intact, power, tone and sensation normal throughout.        Assessment & Plan:

## 2010-12-05 NOTE — Assessment & Plan Note (Signed)
Continues to worsen, non compliant with treatment plan, both in terms of medication and diet

## 2010-12-05 NOTE — Assessment & Plan Note (Signed)
Improved and stable, sees therapist on a regular basis

## 2010-12-05 NOTE — Assessment & Plan Note (Signed)
Unchanged, the importance of lifestyle change to facilitate weight loss is stressed

## 2010-12-05 NOTE — Assessment & Plan Note (Signed)
Deteriorated, with increased age and weight symptoms progressively worsen

## 2010-12-23 ENCOUNTER — Encounter (INDEPENDENT_AMBULATORY_CARE_PROVIDER_SITE_OTHER): Payer: Medicare Other | Admitting: Psychology

## 2010-12-23 DIAGNOSIS — F4001 Agoraphobia with panic disorder: Secondary | ICD-10-CM

## 2010-12-23 DIAGNOSIS — F332 Major depressive disorder, recurrent severe without psychotic features: Secondary | ICD-10-CM

## 2010-12-30 ENCOUNTER — Other Ambulatory Visit: Payer: Self-pay | Admitting: Family Medicine

## 2011-01-20 ENCOUNTER — Encounter (INDEPENDENT_AMBULATORY_CARE_PROVIDER_SITE_OTHER): Payer: Medicare Other | Admitting: Psychology

## 2011-01-20 DIAGNOSIS — F332 Major depressive disorder, recurrent severe without psychotic features: Secondary | ICD-10-CM

## 2011-01-20 DIAGNOSIS — F4001 Agoraphobia with panic disorder: Secondary | ICD-10-CM

## 2011-01-29 ENCOUNTER — Other Ambulatory Visit: Payer: Self-pay | Admitting: Family Medicine

## 2011-01-31 ENCOUNTER — Other Ambulatory Visit: Payer: Self-pay | Admitting: Family Medicine

## 2011-02-26 ENCOUNTER — Other Ambulatory Visit: Payer: Self-pay | Admitting: Family Medicine

## 2011-02-26 ENCOUNTER — Encounter (INDEPENDENT_AMBULATORY_CARE_PROVIDER_SITE_OTHER): Payer: Medicare Other | Admitting: Psychology

## 2011-02-26 DIAGNOSIS — F332 Major depressive disorder, recurrent severe without psychotic features: Secondary | ICD-10-CM

## 2011-02-26 DIAGNOSIS — F4001 Agoraphobia with panic disorder: Secondary | ICD-10-CM

## 2011-02-26 DIAGNOSIS — Z139 Encounter for screening, unspecified: Secondary | ICD-10-CM

## 2011-02-27 ENCOUNTER — Ambulatory Visit (HOSPITAL_COMMUNITY)
Admission: RE | Admit: 2011-02-27 | Discharge: 2011-02-27 | Disposition: A | Discharge status: Home / Self Care | Payer: Medicare Other | Source: Ambulatory Visit | Attending: Family Medicine | Admitting: Family Medicine

## 2011-02-27 DIAGNOSIS — Z139 Encounter for screening, unspecified: Secondary | ICD-10-CM

## 2011-02-27 DIAGNOSIS — Z1231 Encounter for screening mammogram for malignant neoplasm of breast: Secondary | ICD-10-CM | POA: Insufficient documentation

## 2011-03-13 ENCOUNTER — Encounter: Payer: Self-pay | Admitting: Family Medicine

## 2011-03-13 LAB — B-NATRIURETIC PEPTIDE (CONVERTED LAB): Pro B Natriuretic peptide (BNP): <30

## 2011-03-13 LAB — BLOOD GAS, ARTERIAL
FIO2: 0.21
Patient temperature: 37
TCO2: 20
pH, Arterial: 7.348 — ABNORMAL LOW

## 2011-03-18 LAB — HEMOGLOBIN A1C
Hgb A1c MFr Bld: 7.5 % — ABNORMAL HIGH
Mean Plasma Glucose: 169 mg/dL — ABNORMAL HIGH

## 2011-03-18 LAB — BASIC METABOLIC PANEL WITH GFR
BUN: 17 mg/dL (ref 6–23)
CO2: 25 meq/L (ref 19–32)
Calcium: 9 mg/dL (ref 8.4–10.5)
Chloride: 106 meq/L (ref 96–112)
Creat: 1.04 mg/dL (ref 0.50–1.10)
Glucose, Bld: 169 mg/dL — ABNORMAL HIGH (ref 70–99)
Potassium: 4.6 meq/L (ref 3.5–5.3)
Sodium: 142 meq/L (ref 135–145)

## 2011-03-19 ENCOUNTER — Encounter: Payer: Self-pay | Admitting: Family Medicine

## 2011-03-19 ENCOUNTER — Ambulatory Visit (INDEPENDENT_AMBULATORY_CARE_PROVIDER_SITE_OTHER): Payer: Medicare Other | Admitting: Family Medicine

## 2011-03-19 VITALS — BP 140/90 | HR 72 | Resp 17 | Ht 64.0 in | Wt 278.0 lb

## 2011-03-19 DIAGNOSIS — E669 Obesity, unspecified: Secondary | ICD-10-CM

## 2011-03-19 DIAGNOSIS — R5383 Other fatigue: Secondary | ICD-10-CM

## 2011-03-19 DIAGNOSIS — J449 Chronic obstructive pulmonary disease, unspecified: Secondary | ICD-10-CM

## 2011-03-19 DIAGNOSIS — E039 Hypothyroidism, unspecified: Secondary | ICD-10-CM

## 2011-03-19 DIAGNOSIS — I10 Essential (primary) hypertension: Secondary | ICD-10-CM

## 2011-03-19 DIAGNOSIS — E785 Hyperlipidemia, unspecified: Secondary | ICD-10-CM

## 2011-03-19 DIAGNOSIS — F17200 Nicotine dependence, unspecified, uncomplicated: Secondary | ICD-10-CM

## 2011-03-19 MED ORDER — LISINOPRIL 30 MG PO TABS: 30.0000 mg | ORAL_TABLET | Freq: Every day | ORAL | Status: DC

## 2011-03-19 NOTE — Assessment & Plan Note (Signed)
Improved. Pt applauded on succesful weight loss through lifestyle change, and encouraged to continue same. Weight loss goal set for the next several months.  

## 2011-03-19 NOTE — Assessment & Plan Note (Signed)
deteriorated 

## 2011-03-19 NOTE — Patient Instructions (Addendum)
F/U in 3.16months.  Your weight and blood sugars are much better, keep it up.  HBA1C , microalb, cmp and EGFR and TSH in 3.5 months  You will get a flu vaccine today.  You will be referred to Dr Doristine Mango DATE FOR CIGARRETES is DEc  [phone]  Please think about quitting smoking.  This is very important for your health.  Consider setting a quit date, then cutting back or switching brands to prepare to stop.  Also think of the money you will save every day by not smoking.  Quick Tips to Quit Smoking: Fix a date i.e. keep a date in mind from when you would not touch a tobacco product to smoke  Keep yourself busy and block your mind with work loads or reading books or watching movies in malls where smoking is not allowed  Vanish off the things which reminds you about smoking for example match box, or your favorite lighter, or the pipe you used for smoking, or your favorite jeans and shirt with which you used to enjoy smoking, or the club where you used to do smoking  Try to avoid certain people places and incidences where and with whom smoking is a common factor to add on  Praise yourself with some token gifts from the money you saved by stopping smoking  Anti Smoking teams are there to help you. Join their programs  Anti-smoking Gums are there in many medical shops. Try them to quit smoking   Side-effects of Smoking: Disease caused by smoking cigarettes are emphysema, bronchitis, heart failures  Premature death  Cancer is the major side effect of smoking  Heart attacks and strokes are the quick effects of smoking causing sudden death  Some smokers lives end up with limbs amputated  Breathing problem or fast breathing is another side effect of smoking  Due to more intakes of smokes, carbon mono-oxide goes into your brain and other muscles of the body which leads to swelling of the veins and blockage to the air passage to lungs  Carbon monoxide blocks blood vessels which leads to blockage  in the flow of blood to different major body organs like heart lungs and thus leads to attacks and deaths  During pregnancy smoking is very harmful and leads to premature birth of the infant, spontaneous abortions, low weight of the infant during birth  Fat depositions to narrow and blocked blood vessels causing heart attacks  In many cases cigarette smoking caused infertility in men  [employer] on bP medication

## 2011-03-19 NOTE — Assessment & Plan Note (Signed)
Improved, quit date set for approx 8 weeks, current is 7/day

## 2011-03-19 NOTE — Assessment & Plan Note (Signed)
improved no med change

## 2011-03-20 ENCOUNTER — Encounter (INDEPENDENT_AMBULATORY_CARE_PROVIDER_SITE_OTHER): Payer: Medicare Other | Admitting: Psychology

## 2011-03-20 DIAGNOSIS — F3110 Bipolar disorder, current episode manic without psychotic features, unspecified: Secondary | ICD-10-CM

## 2011-03-20 MED ORDER — ROSUVASTATIN CALCIUM 20 MG PO TABS: ORAL_TABLET | ORAL | Status: DC

## 2011-03-20 MED ORDER — ALPRAZOLAM 1 MG PO TABS: 1.0000 mg | ORAL_TABLET | Freq: Four times a day (QID) | ORAL | Status: DC

## 2011-03-20 MED ORDER — MONTELUKAST SODIUM 10 MG PO TABS: ORAL_TABLET | ORAL | Status: DC

## 2011-03-20 MED ORDER — GLIPIZIDE 5 MG PO TABS: ORAL_TABLET | ORAL | Status: DC

## 2011-03-20 MED ORDER — TIZANIDINE HCL 4 MG PO TABS: 4.0000 mg | ORAL_TABLET | Freq: Three times a day (TID) | ORAL | Status: DC

## 2011-03-20 MED ORDER — HYDROCODONE-ACETAMINOPHEN 10-500 MG PO TABS: 1.0000 | ORAL_TABLET | Freq: Four times a day (QID) | ORAL | Status: DC

## 2011-03-20 MED ORDER — CLONIDINE HCL 0.1 MG PO TABS: 0.1000 mg | ORAL_TABLET | Freq: Every day | ORAL | Status: DC

## 2011-03-20 MED ORDER — ZOLPIDEM TARTRATE 10 MG PO TABS: 10.0000 mg | ORAL_TABLET | Freq: Every evening | ORAL | Status: DC | PRN

## 2011-03-20 MED ORDER — POTASSIUM CHLORIDE 20 MEQ/15ML (10%) PO SOLN: ORAL | Status: DC

## 2011-03-20 MED ORDER — MELOXICAM 15 MG PO TABS: 15.0000 mg | ORAL_TABLET | Freq: Every day | ORAL | Status: DC

## 2011-03-23 NOTE — Assessment & Plan Note (Signed)
Controlled, no change in medication  

## 2011-03-23 NOTE — Progress Notes (Signed)
  Subjective:    Patient ID: [name], female    DOB: 18-Jan-1951, 60 y.o.   MRN: [id number]  HPI The PT is here for follow up and re-evaluation of chronic medical conditions, medication management and review of any available recent lab and radiology data.  Preventive health is updated, specifically  Cancer screening and Immunization.   Questions or concerns regarding consultations or procedures which the PT has had in the interim are  addressed. The PT denies any adverse reactions to current medications since the last visit.  Spouse recently diagnosed with liver  Cancer and pt is dealing with this new challenge remarkably well There are no specific complaints  Pt is reducing nicotine use with the hope to quiting by year end      Review of Systems    Denies recent fever or chills. Denies sinus pressure, nasal congestion, ear pain or sore throat. Denies chest congestion, productive cough or wheezing. Denies chest pains, palpitations and leg swelling Denies abdominal pain, nausea, vomiting,diarrhea or constipation.   Denies dysuria, frequency, hesitancy or incontinence. Chronic back pain which limits mobility Denies headaches, seizures, numbness, or tingling. Denies depression, anxiety or insomnia. Denies skin break down or rash.     Objective:   Physical Exam Patient alert and oriented and in no cardiopulmonary distress.  HEENT: No facial asymmetry, EOMI, no sinus tenderness,  oropharynx pink and moist.  Neck supple no adenopathy.  Chest: Clear to auscultation bilaterally.decreased air entry throughout  CVS: S1, S2 no murmurs, no S3.  ABD: Soft non tender. Bowel sounds normal.  Ext: No edema  MS: decreased Rom spine, hips, shoulders and knees  Skin: Intact, no ulcerations or rash noted.  Psych: Good eye contact, normal affect. Memory intact not anxious or depressed appearing.  CNS: CN 2-12 intact, power, tone and sensation normal throughout.          Assessment & Plan:

## 2011-03-23 NOTE — Assessment & Plan Note (Signed)
Uncontrolled. Medication compliance addressed. Commitment to regular exercise, and healthy  eating habits with portion control discussed. DASH diet, and low fat diet discussed, and literature offered. No changes in medication at this time.  

## 2011-04-02 ENCOUNTER — Telehealth: Payer: Self-pay | Admitting: Family Medicine

## 2011-04-03 NOTE — Telephone Encounter (Signed)
Let pt know I have no recent foot exam on chart which the insurance, will need , she will need to come in so I can do that , I am willing to see her briefly not an oV to document a foot exam as an addend to her last visit, she can come one day next week, let me know the decision

## 2011-04-08 NOTE — Progress Notes (Addendum)
  Subjective:    Patient ID: [name], female    DOB: 05/23/1951, 60 y.o.   MRN: [id number]  HPI    Review of Systems     Objective:   Physical Exam Diabetic Foot Check:  Appearance -calluses and onychomycosis Skin - hyperpigmentation and tinea pedis Sensation - grossly intact to light touch Monofilament testing -  Right - Great toe, medial, central, lateral ball and posterior foot diminished Left - Great toe, medial, central, lateral ball and posterior foot diminished Pulses Left - Dorsalis Pedis and Posterior Tibia diminished Right - Dorsalis Pedis and Posterior Tibia diminished        Assessment & Plan:

## 2011-04-10 ENCOUNTER — Ambulatory Visit: Payer: Medicare Other

## 2011-04-18 NOTE — Telephone Encounter (Signed)
Do you want me to call her and get her to come in

## 2011-04-18 NOTE — Telephone Encounter (Signed)
She has already come in and got that done!

## 2011-05-03 ENCOUNTER — Other Ambulatory Visit: Payer: Self-pay | Admitting: Family Medicine

## 2011-05-12 ENCOUNTER — Ambulatory Visit (HOSPITAL_COMMUNITY): Payer: Medicare Other | Admitting: Psychology

## 2011-05-20 ENCOUNTER — Ambulatory Visit (INDEPENDENT_AMBULATORY_CARE_PROVIDER_SITE_OTHER): Payer: Medicare Other | Admitting: Psychology

## 2011-05-20 DIAGNOSIS — F332 Major depressive disorder, recurrent severe without psychotic features: Secondary | ICD-10-CM

## 2011-05-20 DIAGNOSIS — F4001 Agoraphobia with panic disorder: Secondary | ICD-10-CM

## 2011-05-21 NOTE — Progress Notes (Signed)
Patient:  [name]   DOB: 27-Feb-1951  MR Number: [id number]  Location: [facility] [address] [place] [address] Dept: [phone]  Start: 1 PM End: 2 PM  Provider/Observer:     Hershal Coria PSYD  Chief Complaint:      Chief Complaint  Patient presents with  . Panic Attack  . Depression    Reason For Service:     The patient was referred for psychotherapeutic interventions because of ongoing symptoms of depression, anxiety, and mood swings including anger outbursts particularly of a verbal nature.  Interventions Strategy:  Cognitive/behavioral psychotherapeutic interventions  Participation Level:   Active  Participation Quality:  Appropriate      Behavioral Observation:  Well Groomed, Alert, and Appropriate.   Current Psychosocial Factors: The patient reports that she continues to have difficulties and stressors because of her husband's diagnosis of liver cancer and liver disease. She reports that because of this issue that she is having to spend even more time going to doctor's office including traveling to [place] as well as to do the medical school. The patient reports that overall they're handling the situation fairly well but the doctors at [facility] for now talking about doing a liver transplant with her husband to try to take care of both of these issues.  Content of Session:   Review current symptoms and continued work on therapeutic interventions for issues of recurrent depression and panic attacks.  Current Status:   The patient reports that her panic attacks have now been well taken care of and she has not been having nearly any panic attacks and they're very intermittent. The patient reports that she has been utilizing her coping skills and strategies to deal with the stressors in her life to try to alleviate or reduce his symptoms and intrusive symptoms of  depression.  Patient Progress:   Very good considering the situation.  Target Goals:   Target goals have to do with reducing the intensity, duration, and frequency of depressive events as well as reducing the intensity, duration, and frequency of panic attacks. Target goals include building coping skills and strategies around both of these diagnostic issues.  Last Reviewed:   05/21/2011  Goals Addressed Today:    Specific goals addressed today had to do with managing and coping with her cognitive and behavioral responses to stressors particularly around issues that may lead to full-blown panic attacks when she is in medical situations. These have been particular triggers for her in the past for panic attacks due to her medical illnesses and past medical treatments.  Impression/Diagnosis:   The patient has had a lot of significant medical issues to deal with particularly regarding pulmonary function. However, she also has a long history of depression and panic attacks have become exacerbated due to to persistent and serious medical issues.  Diagnosis:    Axis I:  1. Major depressive disorder, recurrent, severe without psychotic features   2. Panic disorder with agoraphobia and moderate panic attacks         Axis II: No diagnosis

## 2011-06-02 ENCOUNTER — Other Ambulatory Visit: Payer: Self-pay | Admitting: Family Medicine

## 2011-06-02 ENCOUNTER — Telehealth: Payer: Self-pay

## 2011-06-02 NOTE — Telephone Encounter (Signed)
needds scheduled appointment for the multiple med changes she is requesting , psl let her know

## 2011-06-02 NOTE — Telephone Encounter (Signed)
States the hydrocodone 10/500 isn't helping her pain. Needs something else. Also, when she was on celebrex it worked better for her arthritis pain than the mobic and zanaflex. Wants to know if she can stop the mobic and zanaflex and go back on just the celebrex and if her hydrocodone can be changed to something else since its not working

## 2011-06-05 NOTE — Telephone Encounter (Signed)
Called and left message for her to call back. If she calls, needs appt to come in before Dr will make any changes with her meds

## 2011-06-09 ENCOUNTER — Telehealth: Payer: Self-pay | Admitting: Family Medicine

## 2011-06-13 LAB — MICROALBUMIN / CREATININE URINE RATIO
Creatinine, Urine: 116.3 mg/dL
Microalb Creat Ratio: 65 mg/g — ABNORMAL HIGH (ref 0.0–30.0)

## 2011-07-02 ENCOUNTER — Ambulatory Visit (HOSPITAL_COMMUNITY): Payer: Medicare Other | Admitting: Psychology

## 2011-07-03 ENCOUNTER — Other Ambulatory Visit: Payer: Self-pay | Admitting: Family Medicine

## 2011-07-14 ENCOUNTER — Telehealth: Payer: Self-pay

## 2011-07-14 DIAGNOSIS — E785 Hyperlipidemia, unspecified: Secondary | ICD-10-CM | POA: Diagnosis not present

## 2011-07-14 DIAGNOSIS — I10 Essential (primary) hypertension: Secondary | ICD-10-CM | POA: Diagnosis not present

## 2011-07-14 DIAGNOSIS — IMO0001 Reserved for inherently not codable concepts without codable children: Secondary | ICD-10-CM | POA: Diagnosis not present

## 2011-07-14 DIAGNOSIS — R5381 Other malaise: Secondary | ICD-10-CM | POA: Diagnosis not present

## 2011-07-14 NOTE — Telephone Encounter (Signed)
Advise her to get the script from the dentist, since he/she can accurately follow what's going on if she has problems. Tell her I wish her all the best. Advise her to keep her blood sugars in range so she does well with healing from the extraction

## 2011-07-15 LAB — COMPLETE METABOLIC PANEL WITH GFR
ALT: 9 U/L (ref 0–35)
AST: 14 U/L (ref 0–37)
CO2: 27 mEq/L (ref 19–32)
Creat: 1.02 mg/dL (ref 0.50–1.10)
GFR, Est African American: 70 mL/min
Sodium: 142 mEq/L (ref 135–145)
Total Bilirubin: 0.3 mg/dL (ref 0.3–1.2)
Total Protein: 6.1 g/dL (ref 6.0–8.3)

## 2011-07-15 LAB — LIPID PANEL
HDL: 38 mg/dL — ABNORMAL LOW (ref >39)
LDL Cholesterol: 69 mg/dL (ref 0–99)
VLDL: 16 mg/dL (ref 0–40)

## 2011-07-15 LAB — HEMOGLOBIN A1C: Hgb A1c MFr Bld: 8.3 % — ABNORMAL HIGH (ref <5.7)

## 2011-07-15 NOTE — Telephone Encounter (Signed)
Spoke with pt and she is aware.

## 2011-07-16 ENCOUNTER — Ambulatory Visit: Payer: Medicare Other | Admitting: Family Medicine

## 2011-07-16 ENCOUNTER — Encounter: Payer: Self-pay | Admitting: Family Medicine

## 2011-07-16 ENCOUNTER — Ambulatory Visit (INDEPENDENT_AMBULATORY_CARE_PROVIDER_SITE_OTHER): Payer: Medicare Other | Admitting: Family Medicine

## 2011-07-16 VITALS — BP 118/80 | HR 79 | Resp 18 | Ht 64.0 in | Wt 275.0 lb

## 2011-07-16 DIAGNOSIS — E785 Hyperlipidemia, unspecified: Secondary | ICD-10-CM

## 2011-07-16 DIAGNOSIS — E669 Obesity, unspecified: Secondary | ICD-10-CM

## 2011-07-16 DIAGNOSIS — J449 Chronic obstructive pulmonary disease, unspecified: Secondary | ICD-10-CM

## 2011-07-16 DIAGNOSIS — F17200 Nicotine dependence, unspecified, uncomplicated: Secondary | ICD-10-CM

## 2011-07-16 DIAGNOSIS — E875 Hyperkalemia: Secondary | ICD-10-CM | POA: Diagnosis not present

## 2011-07-16 DIAGNOSIS — E039 Hypothyroidism, unspecified: Secondary | ICD-10-CM

## 2011-07-16 DIAGNOSIS — I10 Essential (primary) hypertension: Secondary | ICD-10-CM

## 2011-07-16 DIAGNOSIS — K0510 Chronic gingivitis, plaque induced: Secondary | ICD-10-CM | POA: Diagnosis not present

## 2011-07-16 DIAGNOSIS — F329 Major depressive disorder, single episode, unspecified: Secondary | ICD-10-CM

## 2011-07-16 MED ORDER — CEPHALEXIN 500 MG PO CAPS: 500.0000 mg | ORAL_CAPSULE | Freq: Three times a day (TID) | ORAL | Status: AC

## 2011-07-16 NOTE — Patient Instructions (Signed)
F/u in 4.5 month.  Cholesterol, liver, kidney and thyroid are good.  Blood sugar is not as well controlled, pls try to follow the diet better.  I understand you are having a lot of health challenges at home, and I wish you all the best   HBA1C, tsh and cbcin 4.5 month and chem 7,non fasting  Keflex is sent to your pharmacy

## 2011-07-19 NOTE — Progress Notes (Signed)
  Subjective:    Patient ID: [name], female    DOB: January 16, 1951, 61 y.o.   MRN: [id number]  HPI The PT is here for follow up and re-evaluation of chronic medical conditions, medication management and review of any available recent lab and radiology data.  Preventive health is updated, specifically  Cancer screening and Immunization.   Questions or concerns regarding consultations or procedures which the PT has had in the interim are  addressed. The PT denies any adverse reactions to current medications since the last visit.  She has dental work in 2 days and is requesting antibiotic coverage . She has been under increased stress with her spouse who has cirrhosis and is up for a liver transplant in the next several months. Has not been as diligent with her diet as she would like and blood sugars are not as well controlled as in the past       Review of Systems See HPI Denies recent fever or chills. Denies sinus pressure, nasal congestion, ear pain or sore throat. Denies chest congestion, productive cough or wheezing.Chronic dyspnea with oxygen dependence Denies chest pains, palpitations and leg swelling Denies abdominal pain, nausea, vomiting,diarrhea or constipation.   Denies dysuria, frequency, hesitancy or incontinence. Chronic back , hip and knee pain which limit mobility Denies headaches, seizures, numbness, or tingling. Denies uncontrolled  Depression,increased  Anxiety due to health challenges in spouse, denies  insomnia. Denies skin break down or rash.        Objective:   Physical Exam  Patient alert and oriented and in no cardiopulmonary distress.Dependent on supplemental oxygen  HEENT: No facial asymmetry, EOMI, no sinus tenderness,  oropharynx pink and moist.  Neck supple no adenopathy.  Chest: Clear to auscultation bilaterally.Decreased air entry throughout  CVS: S1, S2 no murmurs, no S3.  ABD: Soft non tender. Bowel sounds normal.  Ext: No edema  MS:  decreased ROM spine, shoulders, hips and knees.  Skin: Intact, no ulcerations or rash noted.  Psych: Good eye contact, normal affect. Memory intact not anxious or depressed appearing.  CNS: CN 2-12 intact, power, tone and sensation normal throughout.       Assessment & Plan:

## 2011-07-19 NOTE — Assessment & Plan Note (Signed)
Oxygen dependent and continues to smoke,deterioratring

## 2011-07-19 NOTE — Assessment & Plan Note (Signed)
Cessation counseling done, no commitment to quit date at this visit

## 2011-07-19 NOTE — Assessment & Plan Note (Signed)
Controlled, no change in medication Still sees her therapist regularly, which is very beneficial

## 2011-07-19 NOTE — Assessment & Plan Note (Signed)
Controlled, no change in medication  

## 2011-07-19 NOTE — Assessment & Plan Note (Signed)
Has dental work in 2 days, involves deep cleaning, requests antibiotic coverage, same proveded

## 2011-07-19 NOTE — Assessment & Plan Note (Signed)
Deteriorated, however pt has been less diligent as far as diet is concerned due to stress at home with sick spouse

## 2011-07-19 NOTE — Assessment & Plan Note (Signed)
Deteriorated. Patient re-educated about  the importance of commitment to a  minimum of 150 minutes of exercise per week. The importance of healthy food choices with portion control discussed. Encouraged to start a food diary, count calories and to consider  joining a support group. Sample diet sheets offered. Goals set by the patient for the next several months.    

## 2011-07-21 ENCOUNTER — Ambulatory Visit (HOSPITAL_COMMUNITY): Payer: Medicare Other | Admitting: Psychology

## 2011-07-25 ENCOUNTER — Ambulatory Visit (HOSPITAL_COMMUNITY): Payer: Medicare Other | Admitting: Psychology

## 2011-07-28 DIAGNOSIS — I10 Essential (primary) hypertension: Secondary | ICD-10-CM | POA: Diagnosis not present

## 2011-07-28 DIAGNOSIS — J449 Chronic obstructive pulmonary disease, unspecified: Secondary | ICD-10-CM | POA: Diagnosis not present

## 2011-08-01 ENCOUNTER — Other Ambulatory Visit: Payer: Self-pay | Admitting: Family Medicine

## 2011-08-01 ENCOUNTER — Other Ambulatory Visit: Payer: Self-pay

## 2011-08-01 MED ORDER — LISINOPRIL 20 MG PO TABS: ORAL_TABLET | ORAL | Status: DC

## 2011-08-01 MED ORDER — ROSUVASTATIN CALCIUM 20 MG PO TABS: ORAL_TABLET | ORAL | Status: DC

## 2011-08-01 MED ORDER — GLIPIZIDE 10 MG PO TABS: ORAL_TABLET | ORAL | Status: DC

## 2011-08-04 ENCOUNTER — Ambulatory Visit (INDEPENDENT_AMBULATORY_CARE_PROVIDER_SITE_OTHER): Payer: Medicare Other | Admitting: Family Medicine

## 2011-08-04 ENCOUNTER — Encounter: Payer: Self-pay | Admitting: Family Medicine

## 2011-08-04 VITALS — BP 148/82 | HR 101 | Resp 20 | Ht 64.0 in | Wt 267.1 lb

## 2011-08-04 DIAGNOSIS — E119 Type 2 diabetes mellitus without complications: Secondary | ICD-10-CM | POA: Diagnosis not present

## 2011-08-04 DIAGNOSIS — I10 Essential (primary) hypertension: Secondary | ICD-10-CM

## 2011-08-04 DIAGNOSIS — E1121 Type 2 diabetes mellitus with diabetic nephropathy: Secondary | ICD-10-CM | POA: Insufficient documentation

## 2011-08-04 DIAGNOSIS — E039 Hypothyroidism, unspecified: Secondary | ICD-10-CM

## 2011-08-04 DIAGNOSIS — IMO0002 Reserved for concepts with insufficient information to code with codable children: Secondary | ICD-10-CM | POA: Insufficient documentation

## 2011-08-04 DIAGNOSIS — J449 Chronic obstructive pulmonary disease, unspecified: Secondary | ICD-10-CM | POA: Diagnosis not present

## 2011-08-04 MED ORDER — LISINOPRIL 30 MG PO TABS: 30.0000 mg | ORAL_TABLET | Freq: Every day | ORAL | Status: DC

## 2011-08-04 NOTE — Assessment & Plan Note (Signed)
Uncontrolled, poor eating habits, and non compliant with medication. Requests script for shoes, she qualifies on exam

## 2011-08-04 NOTE — Progress Notes (Signed)
  Subjective:    Patient ID: [name], female    DOB: 1950/07/05, 61 y.o.   MRN: [id number]  HPI The PT is here for follow up and re-evaluation of chronic medical conditions, medication management and review of any available recent lab and radiology data.  Preventive health is updated, specifically  Cancer screening and Immunization.   Primary need is for new evaluation for diabetic shoes, as well as concern about incorrect blood pressure medication with elevated BP. States she had  a "light headed" episode recently     Review of Systems    See HPI Denies recent fever or chills. Denies sinus pressure, nasal congestion, ear pain or sore throat. Denies chest congestion,chronic smokers  Cough and  wheezing. Denies chest pains, palpitations and leg swelling Denies abdominal pain, nausea, vomiting,diarrhea or constipation.   Denies dysuria, frequency, hesitancy or incontinence. Chronic  joint pain, and limitation in mobility. Denies headaches, seizures, numbness, or tingling. Chronic  Depression and  anxiety . Denies skin break down or rash.     Objective:   Physical Exam  Patient alert and oriented and in no cardiopulmonary distress.  HEENT: No facial asymmetry, EOMI, no sinus tenderness,  oropharynx pink and moist.  Neck supple no adenopathy.  Chest: Clear to auscultation bilaterally.  CVS: S1, S2 no murmurs, no S3.  ABD: Soft non tender. Bowel sounds normal.  Ext: No edema  MS: decreased  ROM spine, shoulders, hips and knees.  Skin: Intact, no ulcerations  noted.  Psych: Good eye contact, normal affect. Memory intact not anxious or depressed appearing.  CNS: CN 2-12 intact, power,  normal throughout.  Diabetic Foot Check:  Appearance -  calluses Skin - no unusual pallor or redness Sensation - grossly intact to light touch Monofilament testing -  Right - Great toe, medial, central, lateral ball and posterior foot diminished Left - Great toe, medial,  central, lateral ball and posterior foot diminished Pulses Left - Dorsalis Pedis and Posterior Tibia diminished Right - Dorsalis Pedis and Posterior Tibia diminished      Assessment & Plan:

## 2011-08-04 NOTE — Assessment & Plan Note (Signed)
Continues to deteriorate , due to ongoing nicotine use 

## 2011-08-04 NOTE — Assessment & Plan Note (Signed)
Controlled, no change in medication  

## 2011-08-04 NOTE — Assessment & Plan Note (Signed)
Uncontrolled dose increased again to what was already prescribed in the Fall of last year.

## 2011-08-04 NOTE — Patient Instructions (Signed)
F/U in May   hBA1C and chem before visit  You need to return to diabetic class  Please change how you eat , and start taking the lantus in the morning, titrate every 3 days if needed  New dose of lotensin will be at pharmacy

## 2011-08-19 ENCOUNTER — Telehealth: Payer: Self-pay | Admitting: Family Medicine

## 2011-08-19 NOTE — Telephone Encounter (Signed)
Needed to send in diabetic foot exam and office notes to [facility]. Done and pt aware

## 2011-08-29 ENCOUNTER — Other Ambulatory Visit: Payer: Self-pay | Admitting: Family Medicine

## 2011-09-04 ENCOUNTER — Telehealth: Payer: Self-pay | Admitting: Family Medicine

## 2011-09-04 NOTE — Telephone Encounter (Signed)
I have already sent in all the information needed to [facility]. Called patient and left message for them to return call at the office

## 2011-09-04 NOTE — Telephone Encounter (Signed)
Resent office notes again

## 2011-09-09 ENCOUNTER — Ambulatory Visit (HOSPITAL_COMMUNITY): Payer: Medicare Other | Admitting: Psychology

## 2011-09-25 ENCOUNTER — Ambulatory Visit (HOSPITAL_COMMUNITY): Payer: Medicare Other | Admitting: Psychology

## 2011-10-02 ENCOUNTER — Other Ambulatory Visit: Payer: Self-pay | Admitting: Family Medicine

## 2011-10-09 ENCOUNTER — Ambulatory Visit (INDEPENDENT_AMBULATORY_CARE_PROVIDER_SITE_OTHER): Payer: Medicare Other | Admitting: Psychology

## 2011-10-09 DIAGNOSIS — F332 Major depressive disorder, recurrent severe without psychotic features: Secondary | ICD-10-CM

## 2011-10-09 DIAGNOSIS — F4001 Agoraphobia with panic disorder: Secondary | ICD-10-CM | POA: Diagnosis not present

## 2011-10-23 ENCOUNTER — Telehealth (HOSPITAL_COMMUNITY): Payer: Self-pay | Admitting: Dietician

## 2011-10-23 NOTE — Telephone Encounter (Signed)
Pt familiar to me. Last RD appointment 01/23/2011. Pt requesting "Nutrition In The Fast Lane" pamphlet that she was given during session. Confirmed pt mailing address and mailed copy of "Nutrition In The Fast Lane" pt to pt home via [place] [employer]. Pt grateful for information.

## 2011-10-30 ENCOUNTER — Telehealth (HOSPITAL_COMMUNITY): Payer: Self-pay | Admitting: Dietician

## 2011-10-30 ENCOUNTER — Other Ambulatory Visit: Payer: Self-pay | Admitting: Family Medicine

## 2011-10-30 NOTE — Telephone Encounter (Signed)
Appointment scheduled for 11/10/11 at 2:00 PM.

## 2011-11-05 ENCOUNTER — Telehealth (HOSPITAL_COMMUNITY): Payer: Self-pay | Admitting: Dietician

## 2011-11-05 NOTE — Telephone Encounter (Signed)
Sent appointment verification letter and instructions for appointment on 11/10/11 at 2:00 PM to pt home via [place] [employer].

## 2011-11-10 ENCOUNTER — Telehealth (HOSPITAL_COMMUNITY): Payer: Self-pay | Admitting: Dietician

## 2011-11-10 NOTE — Telephone Encounter (Signed)
Received phone call at 1:34 PM from pt. She reports that she will be unable to make her appointment today at 2:00 PM, as she is not feeling well. She reports that she has upper respiratory symptoms likely due to allergies. She reports she will call back to reschedule when she is feeling better.

## 2011-11-12 ENCOUNTER — Encounter: Payer: Self-pay | Admitting: Family Medicine

## 2011-11-12 ENCOUNTER — Ambulatory Visit (INDEPENDENT_AMBULATORY_CARE_PROVIDER_SITE_OTHER): Payer: Medicare Other | Admitting: Family Medicine

## 2011-11-12 VITALS — BP 138/78 | HR 106 | Resp 18 | Wt 270.0 lb

## 2011-11-12 DIAGNOSIS — E039 Hypothyroidism, unspecified: Secondary | ICD-10-CM

## 2011-11-12 DIAGNOSIS — I10 Essential (primary) hypertension: Secondary | ICD-10-CM | POA: Diagnosis not present

## 2011-11-12 DIAGNOSIS — F17200 Nicotine dependence, unspecified, uncomplicated: Secondary | ICD-10-CM

## 2011-11-12 DIAGNOSIS — E875 Hyperkalemia: Secondary | ICD-10-CM | POA: Diagnosis not present

## 2011-11-12 DIAGNOSIS — IMO0001 Reserved for inherently not codable concepts without codable children: Secondary | ICD-10-CM | POA: Diagnosis not present

## 2011-11-12 DIAGNOSIS — K0510 Chronic gingivitis, plaque induced: Secondary | ICD-10-CM | POA: Diagnosis not present

## 2011-11-12 DIAGNOSIS — E785 Hyperlipidemia, unspecified: Secondary | ICD-10-CM

## 2011-11-12 DIAGNOSIS — E669 Obesity, unspecified: Secondary | ICD-10-CM

## 2011-11-12 DIAGNOSIS — F329 Major depressive disorder, single episode, unspecified: Secondary | ICD-10-CM

## 2011-11-12 MED ORDER — MELOXICAM 15 MG PO TABS: 15.0000 mg | ORAL_TABLET | Freq: Every day | ORAL | Status: DC

## 2011-11-12 MED ORDER — TIZANIDINE HCL 4 MG PO TABS: 4.0000 mg | ORAL_TABLET | Freq: Three times a day (TID) | ORAL | Status: DC

## 2011-11-12 MED ORDER — GLIPIZIDE 10 MG PO TABS: ORAL_TABLET | ORAL | Status: DC

## 2011-11-12 NOTE — Patient Instructions (Addendum)
F/u in 3 month  HBA1c and chem 7 in 3 months  No changes in medication at this me.  Try and get more rest  You need an eye exam, and I hope you get your diabetic shoes soon   Please think about quitting smoking.  This is very important for your health.  Consider setting a quit date, then cutting back or switching brands to prepare to stop.  Also think of the money you will save every day by not smoking.  Quick Tips to Quit Smoking: Fix a date i.e. keep a date in mind from when you would not touch a tobacco product to smoke  Keep yourself busy and block your mind with work loads or reading books or watching movies in malls where smoking is not allowed  Vanish off the things which reminds you about smoking for example match box, or your favorite lighter, or the pipe you used for smoking, or your favorite jeans and shirt with which you used to enjoy smoking, or the club where you used to do smoking  Try to avoid certain people places and incidences where and with whom smoking is a common factor to add on  Praise yourself with some token gifts from the money you saved by stopping smoking  Anti Smoking teams are there to help you. Join their programs  Anti-smoking Gums are there in many medical shops. Try them to quit smoking   Side-effects of Smoking: Disease caused by smoking cigarettes are emphysema, bronchitis, heart failures  Premature death  Cancer is the major side effect of smoking  Heart attacks and strokes are the quick effects of smoking causing sudden death  Some smokers lives end up with limbs amputated  Breathing problem or fast breathing is another side effect of smoking  Due to more intakes of smokes, carbon mono-oxide goes into your brain and other muscles of the body which leads to swelling of the veins and blockage to the air passage to lungs  Carbon monoxide blocks blood vessels which leads to blockage in the flow of blood to different major body organs like heart lungs  and thus leads to attacks and deaths  During pregnancy smoking is very harmful and leads to premature birth of the infant, spontaneous abortions, low weight of the infant during birth  Fat depositions to narrow and blocked blood vessels causing heart attacks  In many cases cigarette smoking caused infertility in men

## 2011-11-12 NOTE — Progress Notes (Signed)
  Subjective:    Patient ID: [name], female    DOB: 1951/02/18, 61 y.o.   MRN: [id number]  HPI The PT is here for follow up and re-evaluation of chronic medical conditions, medication management and review of any available recent lab and radiology data.  Preventive health is updated, specifically  Cancer screening and Immunization.   Questions or concerns regarding consultations or procedures which the PT has had in the interim are  addressed. The PT denies any adverse reactions to current medications since the last visit.  States she has been under increased stress and as a result has not been as diligent as she would wish with control of blood sugars through diet. Ey exam is past due, will attempt to schedule in the next several months    Review of Systems See HPI Denies recent fever or chills. Denies sinus pressure, nasal congestion, ear pain or sore throat. Chronic smoker's  cough with intermittent  wheezing. Denies chest pains, palpitations and leg swelling Denies abdominal pain, nausea, vomiting,diarrhea or constipation.   Denies dysuria, frequency, hesitancy or incontinence. Chronic  joint pain, and limitation in mobility. Denies headaches, seizures, numbness, or tingling. Denies uncontrolled depression,does have increased  anxiety spouse is ill, awaiting organ transplant Denies skin break down or rash.        Objective:   Physical Exam Patient alert and oriented and in no cardiopulmonary distress.Oxygen dependent  HEENT: No facial asymmetry, EOMI, no sinus tenderness,  oropharynx pink and moist.  Neck supple no adenopathy.  Chest:Decreased air entry scattered crackles, few wheezes  CVS: S1, S2 no murmurs, no S3.  ABD: Soft non tender. Bowel sounds normal.  Ext: No edema  MS: decreased ROM spine, shoulders, hips and knees.  Skin: Intact, no ulcerations or rash noted.  Psych: Good eye contact, normal affect. Memory intact mildly anxious not depressed  appearing.  CNS: CN 2-12 intact, power,  normal throughout.        Assessment & Plan:

## 2011-11-13 ENCOUNTER — Ambulatory Visit: Payer: Medicare Other | Admitting: Family Medicine

## 2011-11-13 LAB — BASIC METABOLIC PANEL
Chloride: 107 mEq/L (ref 96–112)
Creat: 1.16 mg/dL — ABNORMAL HIGH (ref 0.50–1.10)
Potassium: 4.8 mEq/L (ref 3.5–5.3)
Sodium: 141 mEq/L (ref 135–145)

## 2011-11-13 LAB — HEMOGLOBIN A1C
Hgb A1c MFr Bld: 7.8 % — ABNORMAL HIGH (ref <5.7)
Mean Plasma Glucose: 177 mg/dL — ABNORMAL HIGH (ref <117)

## 2011-11-13 LAB — CBC
HCT: 41.3 % (ref 36.0–46.0)
Hemoglobin: 13.6 g/dL (ref 12.0–15.0)
MCH: 30.2 pg (ref 26.0–34.0)
MCV: 91.6 fL (ref 78.0–100.0)
Platelets: 292 10*3/uL (ref 150–400)
RBC: 4.51 MIL/uL (ref 3.87–5.11)
RDW: 13.3 % (ref 11.5–15.5)

## 2011-11-22 NOTE — Assessment & Plan Note (Signed)
Unchanged, cessation counselling for 5 minutes done

## 2011-11-22 NOTE — Assessment & Plan Note (Signed)
Continues in therapy regularly, also sees psychiatry, stable

## 2011-11-22 NOTE — Assessment & Plan Note (Signed)
Improved though still not at goal, will consider addition of victoza,or trajenta pt currently has poor dietary habits

## 2011-11-22 NOTE — Assessment & Plan Note (Signed)
Deteriorated. Patient re-educated about  the importance of commitment to a  minimum of 150 minutes of exercise per week. The importance of healthy food choices with portion control discussed. Encouraged to start a food diary, count calories and to consider  joining a support group. Sample diet sheets offered. Goals set by the patient for the next several months.    

## 2011-11-22 NOTE — Assessment & Plan Note (Signed)
Hyperlipidemia:Low fat diet discussed and encouraged.  Controlled, no change in medication   

## 2011-11-22 NOTE — Assessment & Plan Note (Signed)
Sub optimal control, no med change, counseled to reduce sodium intake

## 2011-11-22 NOTE — Assessment & Plan Note (Signed)
Controlled, no change in medication  

## 2011-11-27 ENCOUNTER — Encounter (HOSPITAL_COMMUNITY): Payer: Self-pay | Admitting: Psychology

## 2011-11-27 NOTE — Progress Notes (Signed)
Patient:  [name]   DOB: 1950/10/17  MR Number: [id number]  Location: BEHAVIORAL [facility] [address] [place] [address] Dept: [phone]  Start: 2 PM End: 3 PM  Provider/Observer:     Hershal Coria PSYD  Chief Complaint:      Chief Complaint  Patient presents with  . Depression  . Anxiety  . Panic Attack    Reason For Service:     The patient was referred for psychotherapeutic interventions because of ongoing symptoms of depression, anxiety, and mood swings including anger outbursts particularly of a verbal nature.  Interventions Strategy:  Cognitive/behavioral psychotherapeutic interventions  Participation Level:   Active  Participation Quality:  Appropriate      Behavioral Observation:  Well Groomed, Alert, and Appropriate.   Current Psychosocial Factors:  The patient is still struggling with all of the medical issues that her husband is dealing with with his cancer. The patient is the primary caretaker and this places a lot of stress on her although both she and her husband are coping with the fact that he likely has a terminal condition .  Content of Session:   Review current symptoms and continued work on therapeutic interventions for issues of recurrent depression and panic attacks.  Current Status:   The patient reports that her panic attacks have now been well taken care of and she has not been having nearly any panic attacks and they're very intermittent. The patient reports that she has been utilizing her coping skills and strategies to deal with the stressors in her life to try to alleviate or reduce his symptoms and intrusive symptoms of depression.  Patient Progress:   Very good considering the situation.  Target Goals:   Target goals have to do with reducing the intensity, duration, and frequency of depressive events as well as reducing the intensity, duration, and  frequency of panic attacks. Target goals include building coping skills and strategies around both of these diagnostic issues.  Last Reviewed:   10/09/2011  Goals Addressed Today:    Specific goals addressed today had to do with managing and coping with her cognitive and behavioral responses to stressors particularly around issues that may lead to full-blown panic attacks when she is in medical situations. These have been particular triggers for her in the past for panic attacks due to her medical illnesses and past medical treatments.  Impression/Diagnosis:   The patient has had a lot of significant medical issues to deal with particularly regarding pulmonary function. However, she also has a long history of depression and panic attacks have become exacerbated due to to persistent and serious medical issues.  Diagnosis:    Axis I:  1. Major depressive disorder, recurrent severe without psychotic features   2. Panic disorder with agoraphobia and moderate panic attacks         Axis II: No diagnosis

## 2011-12-11 ENCOUNTER — Telehealth (HOSPITAL_COMMUNITY): Payer: Self-pay | Admitting: *Deleted

## 2011-12-19 ENCOUNTER — Ambulatory Visit (INDEPENDENT_AMBULATORY_CARE_PROVIDER_SITE_OTHER): Payer: Medicare Other | Admitting: Psychology

## 2011-12-19 DIAGNOSIS — F4001 Agoraphobia with panic disorder: Secondary | ICD-10-CM

## 2011-12-19 DIAGNOSIS — F332 Major depressive disorder, recurrent severe without psychotic features: Secondary | ICD-10-CM

## 2011-12-22 ENCOUNTER — Encounter (HOSPITAL_COMMUNITY): Payer: Self-pay | Admitting: Psychology

## 2011-12-22 NOTE — Progress Notes (Signed)
Patient:  [name]   DOB: 13-Jul-1950  MR Number: [id number]  Location: BEHAVIORAL [facility] [address] [place] [address] Dept: [phone]  Start: 4 PM End: 5 PM  Provider/Observer:     Hershal Coria PSYD  Chief Complaint:      Chief Complaint  Patient presents with  . Family Problem  . Stress  . Anxiety  . Depression    Reason For Service:     The patient was referred for psychotherapeutic interventions because of ongoing symptoms of depression, anxiety, and mood swings including anger outbursts particularly of a verbal nature.  Interventions Strategy:  Cognitive/behavioral psychotherapeutic interventions  Participation Level:   Active  Participation Quality:  Appropriate      Behavioral Observation:  Well Groomed, Alert, and Appropriate.   Current Psychosocial Factors:  The patient reports that her husband has passed away. She reports that while he knew he was very sick with liver that the [employer] was still trying to work with him with regard to her transplant. The patient reports that as part of this protocol he had to go have some dental work done. According to the patient the dentist did not pay a great deal of time reviewing the patient's husband's medical issues. He extracted the tooth and the patient began bleeding and it was unable to be stopped. The patient went home after they thought they had stopped it to some degree but he continued to bleed profusely. He was on medications to keep his blood very thin. The patient was treated in any pin for a while and they were unable to stop his bleeding. He was then transferred to [facility] and where they attempted to do with as well and ultimately was transferred to [facility] and put in the ICU. While they did get the bleeding under control finally he was in serious medical state. After they got him somewhat stabilized he was  transported to the [facility] in [place]. It was there where they were trying to treat him for the fact that his kidneys shut down. He ended up passing away from the series of events. This is been very stressful with the patient and while she had that with the fact that he had a likely terminal illness this was extremely traumatic to her and they were not expected him to diet as suddenly..  Content of Session:   Review current symptoms and continued work on therapeutic interventions for issues of recurrent depression and panic attacks.  Current Status:   The patient reports that she has been having to deal with the sudden death of her husband after a very traumatic experience where a simple tooth extraction led to a series of events that resulted in him in the ICU with kidney shut down and death.  Patient Progress:   Very good considering the situation.  Target Goals:   Target goals have to do with reducing the intensity, duration, and frequency of depressive events as well as reducing the intensity, duration, and frequency of panic attacks. Target goals include building coping skills and strategies around both of these diagnostic issues.  Last Reviewed:   12/19/2011  Goals Addressed Today:    Specific goals addressed today had to do with managing and coping with her cognitive and behavioral responses to stressors particularly around issues that may lead to full-blown panic attacks when she is in medical situations. These have been particular triggers for her in the  past for panic attacks due to her medical illnesses and past medical treatments.  Impression/Diagnosis:   The patient has had a lot of significant medical issues to deal with particularly regarding pulmonary function. However, she also has a long history of depression and panic attacks have become exacerbated due to to persistent and serious medical issues.  Diagnosis:    Axis I:  1. Major depressive disorder, recurrent severe without  psychotic features   2. Panic disorder with agoraphobia and moderate panic attacks         Axis II: No diagnosis

## 2011-12-31 ENCOUNTER — Other Ambulatory Visit: Payer: Self-pay | Admitting: Family Medicine

## 2012-01-22 ENCOUNTER — Encounter (HOSPITAL_COMMUNITY): Payer: Self-pay | Admitting: Psychology

## 2012-01-22 ENCOUNTER — Ambulatory Visit (INDEPENDENT_AMBULATORY_CARE_PROVIDER_SITE_OTHER): Payer: Medicare Other | Admitting: Psychology

## 2012-01-22 DIAGNOSIS — F332 Major depressive disorder, recurrent severe without psychotic features: Secondary | ICD-10-CM

## 2012-01-22 DIAGNOSIS — F4001 Agoraphobia with panic disorder: Secondary | ICD-10-CM

## 2012-01-22 NOTE — Progress Notes (Signed)
Patient:  [name]   DOB: [birth year]  MR Number: [id number]  Location: BEHAVIORAL [facility] [address] [place] [address] Dept: [phone]  Start: 3 PM End: 4 PM  Provider/Observer:     Hershal Coria PSYD  Chief Complaint:      Chief Complaint  Patient presents with  . Anxiety  . Depression  . Stress  . Trauma    Reason For Service:     The patient was referred for psychotherapeutic interventions because of ongoing symptoms of depression, anxiety, and mood swings including anger outbursts particularly of a verbal nature.  Interventions Strategy:  Cognitive/behavioral psychotherapeutic interventions  Participation Level:   Active  Participation Quality:  Appropriate      Behavioral Observation:  Well Groomed, Alert, and Appropriate.   Current Psychosocial Factors:  The patient comes in today in months she is continuing to struggle with the death of her husband and the complications around his dying and situation her and his she does report that she is adjusting better to this and overall she is improving. The patient reports that she has spent a lot of time with family members recently and is comeback to try to take care of her own practical issues. She has decided to stay in the house they have been living but because it was a section 8 housing arrangement she was given a have to move out in 60 days because it was a 2 bedroom house. After reviewing the situation she realized that they were only helping out with the wrist as far as $100 a month and with her husband having passed away it would be about $100 a month savings and she would be able to stay there and remain in her sling financial situation and just give up his section 8 status. She has decided she is going to do this abuse currently..  Content of Session:   Review current symptoms and continued work on therapeutic interventions  for issues of recurrent depression and panic attacks.  Current Status:   The patient reports that she has been having to deal with the sudden death of her husband after a very traumatic experience where a simple tooth extraction led to a series of events that resulted in him in the ICU with kidney shut down and death.  Patient Progress:   Very good considering the situation.  Target Goals:   Target goals have to do with reducing the intensity, duration, and frequency of depressive events as well as reducing the intensity, duration, and frequency of panic attacks. Target goals include building coping skills and strategies around both of these diagnostic issues.  Last Reviewed:   01/22/2012  Goals Addressed Today:    Specific goals addressed today had to do with managing and coping with her cognitive and behavioral responses to stressors particularly around issues that may lead to full-blown panic attacks when she is in medical situations. These have been particular triggers for her in the past for panic attacks due to her medical illnesses and past medical treatments.  Impression/Diagnosis:   The patient has had a lot of significant medical issues to deal with particularly regarding pulmonary function. However, she also has a long history of depression and panic attacks have become exacerbated due to to persistent and serious medical issues.  Diagnosis:    Axis I:  1. Major depressive disorder, recurrent severe without psychotic features   2. Panic disorder with agoraphobia and  moderate panic attacks         Axis II: No diagnosis

## 2012-01-27 ENCOUNTER — Other Ambulatory Visit: Payer: Self-pay | Admitting: Family Medicine

## 2012-02-10 DIAGNOSIS — E109 Type 1 diabetes mellitus without complications: Secondary | ICD-10-CM | POA: Diagnosis not present

## 2012-02-10 DIAGNOSIS — M1990 Unspecified osteoarthritis, unspecified site: Secondary | ICD-10-CM | POA: Diagnosis not present

## 2012-02-10 DIAGNOSIS — J449 Chronic obstructive pulmonary disease, unspecified: Secondary | ICD-10-CM | POA: Diagnosis not present

## 2012-02-12 ENCOUNTER — Ambulatory Visit (INDEPENDENT_AMBULATORY_CARE_PROVIDER_SITE_OTHER): Payer: Medicare Other | Admitting: Family Medicine

## 2012-02-12 ENCOUNTER — Encounter: Payer: Self-pay | Admitting: Family Medicine

## 2012-02-12 VITALS — BP 148/90 | HR 86 | Resp 16 | Ht 64.0 in | Wt 271.0 lb

## 2012-02-12 DIAGNOSIS — F3289 Other specified depressive episodes: Secondary | ICD-10-CM

## 2012-02-12 DIAGNOSIS — E039 Hypothyroidism, unspecified: Secondary | ICD-10-CM | POA: Diagnosis not present

## 2012-02-12 DIAGNOSIS — F329 Major depressive disorder, single episode, unspecified: Secondary | ICD-10-CM | POA: Diagnosis not present

## 2012-02-12 DIAGNOSIS — I10 Essential (primary) hypertension: Secondary | ICD-10-CM

## 2012-02-12 DIAGNOSIS — F17200 Nicotine dependence, unspecified, uncomplicated: Secondary | ICD-10-CM

## 2012-02-12 DIAGNOSIS — Z23 Encounter for immunization: Secondary | ICD-10-CM

## 2012-02-12 DIAGNOSIS — E669 Obesity, unspecified: Secondary | ICD-10-CM

## 2012-02-12 DIAGNOSIS — E785 Hyperlipidemia, unspecified: Secondary | ICD-10-CM | POA: Diagnosis not present

## 2012-02-12 DIAGNOSIS — E1065 Type 1 diabetes mellitus with hyperglycemia: Secondary | ICD-10-CM

## 2012-02-12 DIAGNOSIS — IMO0002 Reserved for concepts with insufficient information to code with codable children: Secondary | ICD-10-CM

## 2012-02-12 MED ORDER — GLIPIZIDE 10 MG PO TABS: ORAL_TABLET | ORAL | Status: DC

## 2012-02-12 MED ORDER — LEVOTHYROXINE SODIUM 100 MCG PO TABS: 100.0000 ug | ORAL_TABLET | Freq: Every day | ORAL | Status: DC

## 2012-02-12 MED ORDER — TIZANIDINE HCL 4 MG PO TABS: 4.0000 mg | ORAL_TABLET | Freq: Three times a day (TID) | ORAL | Status: DC

## 2012-02-12 MED ORDER — INSULIN LISPRO PROT & LISPRO (50-50 MIX) 100 UNIT/ML ~~LOC~~ SUSP: SUBCUTANEOUS | Status: DC

## 2012-02-12 MED ORDER — LISINOPRIL-HYDROCHLOROTHIAZIDE 20-12.5 MG PO TABS: ORAL_TABLET | ORAL | Status: DC

## 2012-02-12 NOTE — Patient Instructions (Addendum)
F/u in early December, want to see improved sugars   You need to stop smoking  You need to be compliant with treatment plan so your blood sugar improves    We will need to call you about the labs  Blood pressure is high, I am changing  From zestril 30mg  to lisinopril/hctz20/12.5 one daily, fill this at the end of September  Flu vaccine today

## 2012-02-13 LAB — TSH: TSH: 1.697 u[IU]/mL (ref 0.350–4.500)

## 2012-02-13 LAB — BASIC METABOLIC PANEL
BUN: 16 mg/dL (ref 6–23)
Chloride: 106 mEq/L (ref 96–112)
Potassium: 4.9 mEq/L (ref 3.5–5.3)
Sodium: 142 mEq/L (ref 135–145)

## 2012-02-13 LAB — HEMOGLOBIN A1C
Hgb A1c MFr Bld: 7.7 % — ABNORMAL HIGH (ref <5.7)
Mean Plasma Glucose: 174 mg/dL — ABNORMAL HIGH (ref <117)

## 2012-02-19 ENCOUNTER — Telehealth: Payer: Self-pay | Admitting: Family Medicine

## 2012-02-20 ENCOUNTER — Ambulatory Visit (INDEPENDENT_AMBULATORY_CARE_PROVIDER_SITE_OTHER): Payer: Medicare Other | Admitting: Psychology

## 2012-02-20 DIAGNOSIS — F332 Major depressive disorder, recurrent severe without psychotic features: Secondary | ICD-10-CM | POA: Diagnosis not present

## 2012-02-20 DIAGNOSIS — F4001 Agoraphobia with panic disorder: Secondary | ICD-10-CM | POA: Diagnosis not present

## 2012-02-20 NOTE — Telephone Encounter (Signed)
Called and left message for pt to return call.  

## 2012-02-26 ENCOUNTER — Encounter (HOSPITAL_COMMUNITY): Payer: Self-pay | Admitting: Psychology

## 2012-02-26 NOTE — Progress Notes (Signed)
Patient:  [name]   DOB: February 12, 1951  MR Number: [id number]  Location: BEHAVIORAL [facility] [address] [place] [address] Dept: [phone]  Start: 3 PM End: 4 PM  Provider/Observer:     Hershal Coria PSYD  Chief Complaint:      Chief Complaint  Patient presents with  . Anxiety  . Depression  . Agitation  . Stress    Reason For Service:     The patient was referred for psychotherapeutic interventions because of ongoing symptoms of depression, anxiety, and mood swings including anger outbursts particularly of a verbal nature.  Interventions Strategy:  Cognitive/behavioral psychotherapeutic interventions  Participation Level:   Active  Participation Quality:  Appropriate      Behavioral Observation:  Well Groomed, Alert, and Appropriate.   Current Psychosocial Factors:  The patient reports that she still trying to cope with the loss of her husband. She reports that she has been able to work on forgiveness and adjusting to the actual details around his catheter in the things that happened that made her very unhappy and angry. The patient reports that she was able to secure her continuing to stay in his current house but it is costing her about $150 or more. She reports that she is working on realizing that she is going to need electrode his house and moving go places a better size for her.  Content of Session:   Review current symptoms and continued work on therapeutic interventions for issues of recurrent depression and panic attacks.  Current Status:   The patient reports that she has been having to deal with the sudden death of her husband after a very traumatic experience where a simple tooth extraction led to a series of events that resulted in him in the ICU with kidney shut down and death.  Patient Progress:   Very good considering the situation.  Target Goals:   Target goals  have to do with reducing the intensity, duration, and frequency of depressive events as well as reducing the intensity, duration, and frequency of panic attacks. Target goals include building coping skills and strategies around both of these diagnostic issues.  Last Reviewed:    02/20/2012  Goals Addressed Today:    Specific goals addressed today had to do with managing and coping with her cognitive and behavioral responses to stressors particularly around issues that may lead to full-blown panic attacks when she is in medical situations. These have been particular triggers for her in the past for panic attacks due to her medical illnesses and past medical treatments.  Impression/Diagnosis:   The patient has had a lot of significant medical issues to deal with particularly regarding pulmonary function. However, she also has a long history of depression and panic attacks have become exacerbated due to to persistent and serious medical issues.  Diagnosis:    Axis I:  1. Major depressive disorder, recurrent severe without psychotic features   2. Panic disorder with agoraphobia and moderate panic attacks         Axis II: No diagnosis

## 2012-02-27 ENCOUNTER — Other Ambulatory Visit: Payer: Self-pay | Admitting: Family Medicine

## 2012-02-27 ENCOUNTER — Telehealth: Payer: Self-pay

## 2012-02-27 NOTE — Telephone Encounter (Signed)
Boil came on leg yesterday. About 3x1 inch big. Today it burst and started draining clear liquid. Now sore and still draining clear. Advised urgent care if it started bothering her more and she spoke with luann to schedule. Told to keep area clean and covered

## 2012-03-01 ENCOUNTER — Encounter: Payer: Self-pay | Admitting: Family Medicine

## 2012-03-01 ENCOUNTER — Ambulatory Visit (INDEPENDENT_AMBULATORY_CARE_PROVIDER_SITE_OTHER): Payer: Medicare Other | Admitting: Family Medicine

## 2012-03-01 VITALS — BP 180/88 | HR 102 | Resp 20 | Ht 64.0 in | Wt 258.0 lb

## 2012-03-01 DIAGNOSIS — IMO0001 Reserved for inherently not codable concepts without codable children: Secondary | ICD-10-CM

## 2012-03-01 DIAGNOSIS — J4489 Other specified chronic obstructive pulmonary disease: Secondary | ICD-10-CM

## 2012-03-01 DIAGNOSIS — E039 Hypothyroidism, unspecified: Secondary | ICD-10-CM

## 2012-03-01 DIAGNOSIS — L97909 Non-pressure chronic ulcer of unspecified part of unspecified lower leg with unspecified severity: Secondary | ICD-10-CM

## 2012-03-01 DIAGNOSIS — I10 Essential (primary) hypertension: Secondary | ICD-10-CM

## 2012-03-01 DIAGNOSIS — I83009 Varicose veins of unspecified lower extremity with ulcer of unspecified site: Secondary | ICD-10-CM

## 2012-03-01 DIAGNOSIS — F329 Major depressive disorder, single episode, unspecified: Secondary | ICD-10-CM

## 2012-03-01 DIAGNOSIS — E1065 Type 1 diabetes mellitus with hyperglycemia: Secondary | ICD-10-CM | POA: Diagnosis not present

## 2012-03-01 DIAGNOSIS — L97929 Non-pressure chronic ulcer of unspecified part of left lower leg with unspecified severity: Secondary | ICD-10-CM | POA: Insufficient documentation

## 2012-03-01 DIAGNOSIS — E785 Hyperlipidemia, unspecified: Secondary | ICD-10-CM

## 2012-03-01 DIAGNOSIS — J449 Chronic obstructive pulmonary disease, unspecified: Secondary | ICD-10-CM | POA: Diagnosis not present

## 2012-03-01 DIAGNOSIS — F3289 Other specified depressive episodes: Secondary | ICD-10-CM

## 2012-03-01 DIAGNOSIS — F17200 Nicotine dependence, unspecified, uncomplicated: Secondary | ICD-10-CM

## 2012-03-01 MED ORDER — SULFAMETHOXAZOLE-TRIMETHOPRIM 800-160 MG PO TABS: 1.0000 | ORAL_TABLET | Freq: Two times a day (BID) | ORAL | Status: AC

## 2012-03-01 MED ORDER — FLUOXETINE HCL 10 MG PO CAPS: 10.0000 mg | ORAL_CAPSULE | Freq: Every day | ORAL | Status: DC

## 2012-03-01 MED ORDER — CEFTRIAXONE SODIUM 1 G IJ SOLR
500.0000 mg | Freq: Once | INTRAMUSCULAR | Status: AC
  Administered 2012-03-01: 500 mg via INTRAMUSCULAR

## 2012-03-01 NOTE — Assessment & Plan Note (Signed)
Uncontrolled, dose increase in med DASH diet and commitment to daily physical activity for a minimum of 30 minutes discussed and encouraged, as a part of hypertension management. The importance of attaining a healthy weight is also discussed.  

## 2012-03-01 NOTE — Assessment & Plan Note (Signed)
Hyperlipidemia:Low fat diet discussed and encouraged.  Updated lab neded, HDL was low , otherwise at goal. Physical activity limited by lung and joint disease

## 2012-03-01 NOTE — Assessment & Plan Note (Signed)
Unchanged, remains non compliant with medication and diet

## 2012-03-01 NOTE — Patient Instructions (Addendum)
F/u in 2nd week in  November  You have a blister on the right leg, do not irritate this.  I am starting antibiotics and also referring you to wound specialist in [place], please try and get appt date at checkout before you leave.  Rocephin 500mg  im in office today  Please work on your grief and depression, allow yourself to feel this and get the help and support you need from professionals, family and friends  You need to quit smoking, this will improve your circulation and reduce risk of leg ulcers  Blood pressure is high, please take medication as prescribed

## 2012-03-01 NOTE — Progress Notes (Signed)
  Subjective:    Patient ID: [name], female    DOB: 1950-09-01, 61 y.o.   MRN: [id number]  HPI The PT is here for follow up and re-evaluation of chronic medical conditions, medication management and review of any available recent lab and radiology data.  Preventive health is updated, specifically  Cancer screening and Immunization.   Questions or concerns regarding consultations or procedures which the PT has had in the interim are  addressed. The PT denies any adverse reactions to current medications since the last visit.  There are no new concerns. Blood sugars continue to fluctuate, most often fasting sugars are over 160, her diet and adherence to prescribed medication are both inconsistent There are no specific complaints       Review of Systems See HPI Denies recent fever or chills. Denies sinus pressure, nasal congestion, ear pain or sore throat. Denies chest congestion, productive cough or wheezing.Oxygen dependent Denies chest pains, palpitations and leg swelling Denies abdominal pain, nausea, vomiting,diarrhea or constipation.   Denies dysuria, frequency, hesitancy or incontinence. Chronic  joint pain, swelling and limitation in mobility.Ambulates with a waklker Denies headaches, seizures, numbness, or tingling. Denies depression, anxiety or insomnia. Denies skin break down or rash.       Objective:   Physical Exam .Patient alert and oriented and in no cardiopulmonary distress.  HEENT: No facial asymmetry, EOMI, no sinus tenderness,  oropharynx pink and moist.  Neck supple no adenopathy.  Chest: Clear to auscultation bilaterally.Decreased air entry throughout, oxygen dpendent  CVS: S1, S2 no murmurs, no S3.  ABD: Soft non tender. Bowel sounds normal.  Ext: No edema  MS: decreased  ROM spine, shoulders, hips and knees.  Skin: Intact, no ulcerations or rash noted.  Psych: Good eye contact, normal affect. Memory intact not anxious or depressed  appearing.  CNS: CN 2-12 intact, power normal throughout.         Assessment & Plan:

## 2012-03-01 NOTE — Assessment & Plan Note (Signed)
Unchanged. Patient counseled for approximately 5 minutes regarding the health risks of ongoing nicotine use, specifically all types of cancer, heart disease, stroke and respiratory failure. The options available for help with cessation ,the behavioral changes to assist the process, and the option to either gradully reduce usage  Or abruptly stop.is also discussed. Pt is also encouraged to set specific goals in number of cigarettes used daily, as well as to set a quit date.  

## 2012-03-01 NOTE — Assessment & Plan Note (Signed)
Deteriorated. Patient re-educated about  the importance of commitment to a  minimum of 150 minutes of exercise per week. The importance of healthy food choices with portion control discussed. Encouraged to start a food diary, count calories and to consider  joining a support group. Sample diet sheets offered. Goals set by the patient for the next several months.    

## 2012-03-01 NOTE — Assessment & Plan Note (Signed)
Controlled, no change in medication  

## 2012-03-01 NOTE — Progress Notes (Signed)
  Subjective:    Patient ID: [name], female    DOB: 22-Oct-1950, 61 y.o.   MRN: [id number]  HPI Pt in with primary initial c/o new blister on right leg, no recall of incitng trauma, not particualrly painful, drainage from the wound, no fever or chills Espescialy concerned since her circulation is not the best and she knows that her diabetes is uncontrolled. Also wants to vent and cry over her grief re the recent loss of her ill spouse, though sees her therapist is clearly not coping well, unwilling to consider quitting cigarettes, states she is using them to help her cope. She is not suicidal or homicidal   Review of Systems See HPI Denies recent fever or chills. Denies sinus pressure, nasal congestion, ear pain or sore throat. Denies chest congestion, productive cough or wheezing. Denies chest pains, palpitations and leg swelling Denies abdominal pain, nausea, vomiting,diarrhea or constipation.   Denies dysuria, frequency, hesitancy or incontinence. Chronic  joint pain, and  limitation in mobility. Denies headaches, seizures, numbness, or tingling. . Denies skin break down or rash.       Objective:   Physical Exam Patient alert and oriented and in no cardiopulmonary distress.Pt tearful   HEENT: No facial asymmetry, EOMI, no sinus tenderness,  oropharynx pink and moist.  Neck supple no adenopathy.  Chest: Clear to auscultation bilaterally.Decreased air entry throughout  CVS: S1, S2 no murmurs, no S3.  ABD: Soft non tender. Bowel sounds normal.  Ext: No edema  MS: Decreased  ROM spine, shoulders, hips and knees.  Skin:Ulcer on right leg, diameter approx 3 inches, stage 2, drainage from area Psych: Good eye contact, normal affect. Memory intact  anxious and  depressed appearing.  CNS: CN 2-12 intact, power, tone and sensation normal throughout.        Assessment & Plan:

## 2012-03-01 NOTE — Assessment & Plan Note (Signed)
Improved, following death of ill spouse Involved in therapy

## 2012-03-02 NOTE — Telephone Encounter (Signed)
Patient in for office visit 03/01/2012.

## 2012-03-03 DIAGNOSIS — E119 Type 2 diabetes mellitus without complications: Secondary | ICD-10-CM | POA: Diagnosis not present

## 2012-03-03 DIAGNOSIS — I749 Embolism and thrombosis of unspecified artery: Secondary | ICD-10-CM | POA: Diagnosis not present

## 2012-03-03 DIAGNOSIS — L97809 Non-pressure chronic ulcer of other part of unspecified lower leg with unspecified severity: Secondary | ICD-10-CM | POA: Diagnosis not present

## 2012-03-03 DIAGNOSIS — I872 Venous insufficiency (chronic) (peripheral): Secondary | ICD-10-CM | POA: Diagnosis not present

## 2012-03-03 DIAGNOSIS — F17200 Nicotine dependence, unspecified, uncomplicated: Secondary | ICD-10-CM | POA: Diagnosis not present

## 2012-03-03 DIAGNOSIS — Z9981 Dependence on supplemental oxygen: Secondary | ICD-10-CM | POA: Diagnosis not present

## 2012-03-03 DIAGNOSIS — IMO0002 Reserved for concepts with insufficient information to code with codable children: Secondary | ICD-10-CM | POA: Diagnosis not present

## 2012-03-03 DIAGNOSIS — J438 Other emphysema: Secondary | ICD-10-CM | POA: Diagnosis not present

## 2012-03-03 DIAGNOSIS — E8779 Other fluid overload: Secondary | ICD-10-CM | POA: Diagnosis not present

## 2012-03-03 DIAGNOSIS — I87319 Chronic venous hypertension (idiopathic) with ulcer of unspecified lower extremity: Secondary | ICD-10-CM | POA: Diagnosis not present

## 2012-03-07 NOTE — Assessment & Plan Note (Signed)
Controlled, no change in medication  

## 2012-03-07 NOTE — Assessment & Plan Note (Signed)
Aggressive treatment due to uncontrolled diabetes and poor circulation. Antibiotic administered in office and also prescribed. Refer to wound center

## 2012-03-07 NOTE — Assessment & Plan Note (Signed)
Adherence both to medication as well as to diet discussed and encouraged. She currently remains uncontrolled

## 2012-03-07 NOTE — Assessment & Plan Note (Signed)
Continues to worsen due to ongoing nicotine use

## 2012-03-07 NOTE — Assessment & Plan Note (Signed)
Controlled, no change in medication Hyperlipidemia:Low fat diet discussed and encouraged.  Updated lab needed 

## 2012-03-07 NOTE — Assessment & Plan Note (Signed)
Uncontrolled, has not expressed the grief over the recent loss of her husband, needs to seek more help, and may need med adjustment. Not suicidal or homicidal

## 2012-03-07 NOTE — Assessment & Plan Note (Signed)
Uncontrolled due to medication non compliance. DASH diet and commitment to daily physical activity for a minimum of 30 minutes discussed and encouraged, as a part of hypertension management. The importance of attaining a healthy weight is also discussed.  

## 2012-03-07 NOTE — Assessment & Plan Note (Signed)
Worsened Patient counseled for approximately 5 minutes regarding the health risks of ongoing nicotine use, specifically all types of cancer, heart disease, stroke and respiratory failure. The options available for help with cessation ,the behavioral changes to assist the process, and the option to either gradully reduce usage  Or abruptly stop.is also discussed. Pt is also encouraged to set specific goals in number of cigarettes used daily, as well as to set a quit date.  

## 2012-03-10 ENCOUNTER — Telehealth: Payer: Self-pay | Admitting: Family Medicine

## 2012-03-10 DIAGNOSIS — IMO0002 Reserved for concepts with insufficient information to code with codable children: Secondary | ICD-10-CM | POA: Diagnosis not present

## 2012-03-10 DIAGNOSIS — L97809 Non-pressure chronic ulcer of other part of unspecified lower leg with unspecified severity: Secondary | ICD-10-CM | POA: Diagnosis not present

## 2012-03-10 DIAGNOSIS — L97909 Non-pressure chronic ulcer of unspecified part of unspecified lower leg with unspecified severity: Secondary | ICD-10-CM | POA: Diagnosis not present

## 2012-03-10 DIAGNOSIS — E119 Type 2 diabetes mellitus without complications: Secondary | ICD-10-CM | POA: Diagnosis not present

## 2012-03-10 DIAGNOSIS — L97309 Non-pressure chronic ulcer of unspecified ankle with unspecified severity: Secondary | ICD-10-CM | POA: Diagnosis not present

## 2012-03-10 DIAGNOSIS — I749 Embolism and thrombosis of unspecified artery: Secondary | ICD-10-CM | POA: Diagnosis not present

## 2012-03-10 DIAGNOSIS — I872 Venous insufficiency (chronic) (peripheral): Secondary | ICD-10-CM | POA: Diagnosis not present

## 2012-03-10 DIAGNOSIS — J438 Other emphysema: Secondary | ICD-10-CM | POA: Diagnosis not present

## 2012-03-10 DIAGNOSIS — I87319 Chronic venous hypertension (idiopathic) with ulcer of unspecified lower extremity: Secondary | ICD-10-CM | POA: Diagnosis not present

## 2012-03-11 NOTE — Telephone Encounter (Signed)
Supply request sent in.

## 2012-03-17 DIAGNOSIS — L97809 Non-pressure chronic ulcer of other part of unspecified lower leg with unspecified severity: Secondary | ICD-10-CM | POA: Diagnosis not present

## 2012-03-17 DIAGNOSIS — L97909 Non-pressure chronic ulcer of unspecified part of unspecified lower leg with unspecified severity: Secondary | ICD-10-CM | POA: Diagnosis not present

## 2012-03-17 DIAGNOSIS — I872 Venous insufficiency (chronic) (peripheral): Secondary | ICD-10-CM | POA: Diagnosis not present

## 2012-03-17 DIAGNOSIS — J438 Other emphysema: Secondary | ICD-10-CM | POA: Diagnosis not present

## 2012-03-17 DIAGNOSIS — E119 Type 2 diabetes mellitus without complications: Secondary | ICD-10-CM | POA: Diagnosis not present

## 2012-03-17 DIAGNOSIS — I749 Embolism and thrombosis of unspecified artery: Secondary | ICD-10-CM | POA: Diagnosis not present

## 2012-03-17 DIAGNOSIS — IMO0002 Reserved for concepts with insufficient information to code with codable children: Secondary | ICD-10-CM | POA: Diagnosis not present

## 2012-03-19 ENCOUNTER — Other Ambulatory Visit: Payer: Self-pay | Admitting: Family Medicine

## 2012-03-19 DIAGNOSIS — Z139 Encounter for screening, unspecified: Secondary | ICD-10-CM

## 2012-03-23 ENCOUNTER — Ambulatory Visit (HOSPITAL_COMMUNITY)
Admission: RE | Admit: 2012-03-23 | Discharge: 2012-03-23 | Disposition: A | Discharge status: Home / Self Care | Payer: Medicare Other | Source: Ambulatory Visit | Attending: Family Medicine | Admitting: Family Medicine

## 2012-03-23 DIAGNOSIS — Z1231 Encounter for screening mammogram for malignant neoplasm of breast: Secondary | ICD-10-CM | POA: Diagnosis not present

## 2012-03-23 DIAGNOSIS — Z139 Encounter for screening, unspecified: Secondary | ICD-10-CM

## 2012-03-30 ENCOUNTER — Other Ambulatory Visit: Payer: Self-pay | Admitting: Family Medicine

## 2012-03-30 ENCOUNTER — Ambulatory Visit (INDEPENDENT_AMBULATORY_CARE_PROVIDER_SITE_OTHER): Payer: Medicare Other | Admitting: Psychology

## 2012-03-30 DIAGNOSIS — F4001 Agoraphobia with panic disorder: Secondary | ICD-10-CM

## 2012-03-30 DIAGNOSIS — F332 Major depressive disorder, recurrent severe without psychotic features: Secondary | ICD-10-CM

## 2012-03-31 ENCOUNTER — Encounter (HOSPITAL_COMMUNITY): Payer: Self-pay | Admitting: Psychology

## 2012-03-31 NOTE — Progress Notes (Signed)
Patient:  [name]   DOB: 02/29/1951  MR Number: [id number]  Location: BEHAVIORAL [facility] [address] [place] [address] Dept: [phone]  Start: 3 PM End: 4 PM  Provider/Observer:     Hershal Coria PSYD  Chief Complaint:      Chief Complaint  Patient presents with  . Anxiety  . Stress  . Trauma  . Panic Attack    Reason For Service:     The patient was referred for psychotherapeutic interventions because of ongoing symptoms of depression, anxiety, and mood swings including anger outbursts particularly of a verbal nature.  Interventions Strategy:  Cognitive/behavioral psychotherapeutic interventions  Participation Level:   Active  Participation Quality:  Appropriate      Behavioral Observation:  Well Groomed, Alert, and Appropriate.   Current Psychosocial Factors:  The patient comes in today and reports that she is continuing to cope with and year with the death of her husband. She reports that the flashbacks that she has been experiencing are becoming less frequent, intent, and of reduced duration. The patient reports that she continues to struggle with what to do next in her financial situation is precarious now that where she was living they've raised rent $200 after her husband died.  Because she was not in a situation where she was capable of moving and she had a limited time to decide she had to give up her husband supported housing status and she was told that it could be years before she would become eligible again.  Content of Session:   Review current symptoms and continued work on therapeutic interventions for issues of recurrent depression and panic attacks.  Current Status:   The patient reports that she has been having to deal with the sudden death of her husband after a very traumatic experience where a simple tooth extraction led to a series of events that resulted in  him in the ICU with kidney shut down and death.  Patient Progress:   Very good considering the situation.  Target Goals:   Target goals have to do with reducing the intensity, duration, and frequency of depressive events as well as reducing the intensity, duration, and frequency of panic attacks. Target goals include building coping skills and strategies around both of these diagnostic issues.  Last Reviewed:    03/30/2012  Goals Addressed Today:    Specific goals addressed today had to do with managing and coping with her cognitive and behavioral responses to stressors particularly around issues that may lead to full-blown panic attacks when she is in medical situations. These have been particular triggers for her in the past for panic attacks due to her medical illnesses and past medical treatments.  Impression/Diagnosis:   The patient has had a lot of significant medical issues to deal with particularly regarding pulmonary function. However, she also has a long history of depression and panic attacks have become exacerbated due to to persistent and serious medical issues.  Diagnosis:    Axis I:  1. Major depressive disorder, recurrent severe without psychotic features   2. Panic disorder with agoraphobia and moderate panic attacks         Axis II: No diagnosis

## 2012-04-12 ENCOUNTER — Ambulatory Visit (INDEPENDENT_AMBULATORY_CARE_PROVIDER_SITE_OTHER): Payer: Medicare Other | Admitting: Family Medicine

## 2012-04-12 ENCOUNTER — Encounter: Payer: Self-pay | Admitting: Family Medicine

## 2012-04-12 VITALS — BP 140/84 | HR 102 | Resp 20 | Ht 64.0 in | Wt 263.0 lb

## 2012-04-12 DIAGNOSIS — E039 Hypothyroidism, unspecified: Secondary | ICD-10-CM | POA: Diagnosis not present

## 2012-04-12 DIAGNOSIS — E669 Obesity, unspecified: Secondary | ICD-10-CM

## 2012-04-12 DIAGNOSIS — I83009 Varicose veins of unspecified lower extremity with ulcer of unspecified site: Secondary | ICD-10-CM

## 2012-04-12 DIAGNOSIS — E785 Hyperlipidemia, unspecified: Secondary | ICD-10-CM | POA: Diagnosis not present

## 2012-04-12 DIAGNOSIS — R0989 Other specified symptoms and signs involving the circulatory and respiratory systems: Secondary | ICD-10-CM | POA: Diagnosis not present

## 2012-04-12 DIAGNOSIS — I739 Peripheral vascular disease, unspecified: Secondary | ICD-10-CM

## 2012-04-12 DIAGNOSIS — I10 Essential (primary) hypertension: Secondary | ICD-10-CM

## 2012-04-12 DIAGNOSIS — L97909 Non-pressure chronic ulcer of unspecified part of unspecified lower leg with unspecified severity: Secondary | ICD-10-CM

## 2012-04-12 DIAGNOSIS — E1065 Type 1 diabetes mellitus with hyperglycemia: Secondary | ICD-10-CM | POA: Diagnosis not present

## 2012-04-12 DIAGNOSIS — F329 Major depressive disorder, single episode, unspecified: Secondary | ICD-10-CM

## 2012-04-12 DIAGNOSIS — F17200 Nicotine dependence, unspecified, uncomplicated: Secondary | ICD-10-CM

## 2012-04-12 MED ORDER — FLUOXETINE HCL 20 MG PO CAPS: 20.0000 mg | ORAL_CAPSULE | Freq: Every day | ORAL | Status: DC

## 2012-04-12 NOTE — Progress Notes (Signed)
  Subjective:    Patient ID: [name], female    DOB: 11-Nov-1950, 61 y.o.   MRN: [id number]  HPI The PT is here for follow up and re-evaluation of chronic medical conditions,in particular depression and stasis ulcer medication management and review of any available recent lab and radiology data.  Preventive health is updated, specifically  Cancer screening and Immunization.   Questions or concerns regarding consultations or procedures which the PT has had in the interim are  Addressed.Has been fully evaluated at wound center compression hose recommended The PT denies any adverse reactions to current medications since the last visit.  There are no new concerns.  There are no specific complaints       Review of Systems See HPI Denies recent fever or chills. Denies sinus pressure, nasal congestion, ear pain or sore throat. Denies chest congestion, productive cough or wheezing.Oxygen dependent Denies chest pains, palpitations and leg swelling Denies abdominal pain, nausea, vomiting,diarrhea or constipation.   Denies dysuria, frequency, hesitancy or incontinence. Chronic  joint pain, swelling and limitation in mobility. Denies headaches, seizures, numbness, or tingling. Reports improvement in depression with medication, denies anxiety, uses sleep aid for insomnia Denies skin break down or rash.        Objective:   Physical Exam  Patient alert and oriented and in no cardiopulmonary distress.  HEENT: No facial asymmetry, EOMI, no sinus tenderness,  oropharynx pink and moist.  Neck supple no adenopathy.  Chest: Clear to auscultation bilaterally.decreased air entry bilaterally  CVS: S1, S2 no murmurs, no S3.  ABD: Soft non tender. Bowel sounds normal.  Ext: No edema  MS: Decreased ROM spine, shoulders, hips and knees.  Skin: Intact, no ulcerations or rash noted.  Psych: Good eye contact, normal affect. Memory intact not anxious or depressed appearing.  CNS: CN 2-12  intact, power, tone and sensation normal throughout.       Assessment & Plan:

## 2012-04-12 NOTE — Patient Instructions (Addendum)
Annual wellness in 4 month, call if you need me before  HBa1C and TSH and chem 7 today  Fasting lipid, cmp and EGFR and hBa1c in 4  month  I am happy that you are doing better , dose increase in fluoxetine  To 20mg  daily  Please continue to check blood sugars at least twice daily  You are referred for carotid doppler to evaluate bruit.  Start walking regualrly to improve the circulation in your legs please.  Current lantus you report taking is 20 units. You will be called with the blood test results if you need a dose adjustment  Dose increase in fluoxetine to 20mg  daily, OK to take TWE 10mg  tablets once daily till done

## 2012-04-13 ENCOUNTER — Ambulatory Visit (HOSPITAL_COMMUNITY)
Admission: RE | Admit: 2012-04-13 | Discharge: 2012-04-13 | Disposition: A | Discharge status: Home / Self Care | Payer: Medicare Other | Source: Ambulatory Visit | Attending: Family Medicine | Admitting: Family Medicine

## 2012-04-13 DIAGNOSIS — I10 Essential (primary) hypertension: Secondary | ICD-10-CM | POA: Diagnosis not present

## 2012-04-13 DIAGNOSIS — I658 Occlusion and stenosis of other precerebral arteries: Secondary | ICD-10-CM | POA: Diagnosis not present

## 2012-04-13 DIAGNOSIS — R0989 Other specified symptoms and signs involving the circulatory and respiratory systems: Secondary | ICD-10-CM | POA: Diagnosis not present

## 2012-04-13 LAB — BASIC METABOLIC PANEL
BUN: 31 mg/dL — ABNORMAL HIGH (ref 6–23)
CO2: 29 mEq/L (ref 19–32)
Calcium: 9.2 mg/dL (ref 8.4–10.5)
Glucose, Bld: 162 mg/dL — ABNORMAL HIGH (ref 70–99)
Potassium: 4.5 mEq/L (ref 3.5–5.3)
Sodium: 139 mEq/L (ref 135–145)

## 2012-04-21 ENCOUNTER — Other Ambulatory Visit: Payer: Self-pay

## 2012-04-21 MED ORDER — HYDROCODONE-ACETAMINOPHEN 10-500 MG PO TABS: 1.0000 | ORAL_TABLET | Freq: Four times a day (QID) | ORAL | Status: DC | PRN

## 2012-04-21 MED ORDER — ALPRAZOLAM 1 MG PO TABS: 1.0000 mg | ORAL_TABLET | Freq: Four times a day (QID) | ORAL | Status: DC

## 2012-04-21 MED ORDER — ESOMEPRAZOLE MAGNESIUM 40 MG PO CPDR: 40.0000 mg | DELAYED_RELEASE_CAPSULE | Freq: Every day | ORAL | Status: DC

## 2012-04-21 MED ORDER — MONTELUKAST SODIUM 10 MG PO TABS: 10.0000 mg | ORAL_TABLET | Freq: Every day | ORAL | Status: DC

## 2012-04-21 MED ORDER — ROSUVASTATIN CALCIUM 20 MG PO TABS: 20.0000 mg | ORAL_TABLET | Freq: Every day | ORAL | Status: DC

## 2012-04-21 MED ORDER — ZOLPIDEM TARTRATE 10 MG PO TABS: 10.0000 mg | ORAL_TABLET | Freq: Every evening | ORAL | Status: DC | PRN

## 2012-04-21 MED ORDER — FUROSEMIDE 40 MG PO TABS: 40.0000 mg | ORAL_TABLET | Freq: Every day | ORAL | Status: DC

## 2012-04-21 MED ORDER — CLONIDINE HCL 0.1 MG PO TABS: 0.1000 mg | ORAL_TABLET | Freq: Two times a day (BID) | ORAL | Status: DC

## 2012-04-23 NOTE — Assessment & Plan Note (Signed)
Hyperlipidemia:Low fat diet discussed and encouraged.  Updated lab past due  

## 2012-04-23 NOTE — Assessment & Plan Note (Signed)
ABI testing recently done by vascular specialist per pt reporting

## 2012-04-23 NOTE — Assessment & Plan Note (Signed)
Unchanged. Patient counseled for approximately 5 minutes regarding the health risks of ongoing nicotine use, specifically all types of cancer, heart disease, stroke and respiratory failure. The options available for help with cessation ,the behavioral changes to assist the process, and the option to either gradully reduce usage  Or abruptly stop.is also discussed. Pt is also encouraged to set specific goals in number of cigarettes used daily, as well as to set a quit date.  

## 2012-04-23 NOTE — Assessment & Plan Note (Signed)
fully healed at this time

## 2012-04-23 NOTE — Assessment & Plan Note (Signed)
Unchanged. Patient re-educated about  the importance of commitment to a  minimum of 150 minutes of exercise per week. The importance of healthy food choices with portion control discussed. Encouraged to start a food diary, count calories and to consider  joining a support group. Sample diet sheets offered. Goals set by the patient for the next several months.    

## 2012-04-23 NOTE — Assessment & Plan Note (Signed)
Controlled, no change in medication DASH diet and commitment to daily physical activity for a minimum of 30 minutes discussed and encouraged, as a part of hypertension management. The importance of attaining a healthy weight is also discussed.  

## 2012-04-23 NOTE — Assessment & Plan Note (Signed)
Improved, but needs dose increase on fluoxetine, will do same

## 2012-04-23 NOTE — Assessment & Plan Note (Signed)
Controlled, no change in medication  

## 2012-04-28 ENCOUNTER — Ambulatory Visit (HOSPITAL_COMMUNITY): Payer: Self-pay | Admitting: Psychology

## 2012-05-13 ENCOUNTER — Encounter: Payer: Self-pay | Admitting: Family Medicine

## 2012-05-13 ENCOUNTER — Ambulatory Visit (INDEPENDENT_AMBULATORY_CARE_PROVIDER_SITE_OTHER): Payer: Medicare Other | Admitting: Family Medicine

## 2012-05-13 VITALS — BP 124/68 | HR 78 | Resp 20 | Ht 64.0 in | Wt 262.1 lb

## 2012-05-13 DIAGNOSIS — IMO0001 Reserved for inherently not codable concepts without codable children: Secondary | ICD-10-CM

## 2012-05-13 DIAGNOSIS — E1065 Type 1 diabetes mellitus with hyperglycemia: Secondary | ICD-10-CM | POA: Diagnosis not present

## 2012-05-13 DIAGNOSIS — E785 Hyperlipidemia, unspecified: Secondary | ICD-10-CM | POA: Diagnosis not present

## 2012-05-13 DIAGNOSIS — I10 Essential (primary) hypertension: Secondary | ICD-10-CM

## 2012-05-13 DIAGNOSIS — F17200 Nicotine dependence, unspecified, uncomplicated: Secondary | ICD-10-CM | POA: Diagnosis not present

## 2012-05-13 DIAGNOSIS — F3289 Other specified depressive episodes: Secondary | ICD-10-CM

## 2012-05-13 DIAGNOSIS — E669 Obesity, unspecified: Secondary | ICD-10-CM

## 2012-05-13 DIAGNOSIS — E039 Hypothyroidism, unspecified: Secondary | ICD-10-CM

## 2012-05-13 DIAGNOSIS — F329 Major depressive disorder, single episode, unspecified: Secondary | ICD-10-CM

## 2012-05-13 MED ORDER — GLIPIZIDE ER 2.5 MG PO TB24: 2.5000 mg | ORAL_TABLET | Freq: Every day | ORAL | Status: DC

## 2012-05-13 MED ORDER — INSULIN GLARGINE 100 UNIT/ML ~~LOC~~ SOLN: 40.0000 [IU] | Freq: Every day | SUBCUTANEOUS | Status: DC

## 2012-05-13 NOTE — Progress Notes (Signed)
  Subjective:    Patient ID: [name], female    DOB: [birth year], 61 y.o.   MRN: [id number]  HPI  The PT is here for follow up and re-evaluation of chronic medical conditions, medication management and review of any available recent lab and radiology data.  Preventive health is updated, specifically  Cancer screening and Immunization.   Questions or concerns regarding consultations or procedures which the PT has had in the interim are  addressed. The PT denies any adverse reactions to current medications since the last visit.  Here to review blood suagr, in particular, which remains uncontrolled and has worsened, pt continues to self medicate, and not follow diet . Insulin and glipizide are taken as she feels. Blood sugars are being tested 4 to 5 times per day and are seldom under 200     Review of Systems See HPI Denies recent fever or chills. Denies sinus pressure, nasal congestion, ear pain or sore throat. Denies chest congestion, productive cough has intermittent  Wheezing and shortness of breath with activity, depends on oxygen Denies chest pains, palpitations and leg swelling Denies abdominal pain, nausea, vomiting,diarrhea or constipation.   Denies dysuria, frequency, hesitancy or incontinence. Chronic  joint pain,  and limitation in mobility. Denies headaches, seizures, numbness, or tingling. Chronic depression, anxiety and  Insomnia.Getting over the grieving process of losing her partner Denies skin break down or rash.        Objective:   Physical Exam Patient alert and oriented and in no cardiopulmonary distress.Oxygen dependent  HEENT: No facial asymmetry, EOMI, no sinus tenderness,  oropharynx pink and moist.  Neck supple no adenopathy.  Chest: Clear to auscultation bilaterally.Decreased air entry throughout  CVS: S1, S2 no murmurs, no S3.  ABD: Soft non tender. Bowel sounds normal.  Ext: No edema  MS: Adequate though reduced  ROM spine, shoulders,  hips and knees.  Skin: Intact, no ulcerations or rash noted.  Psych: Good eye contact, normal affect. Memory impaired, mildly anxious and  depressed appearing.  CNS: CN 2-12 intact, power, tone and sensation normal throughout.        Assessment & Plan:

## 2012-05-13 NOTE — Patient Instructions (Addendum)
F/U in 5 weeks    Test sugars 3 times daily and write them down, before breakfast, 2 hours after lunch and at bedtime .  Goal for fasting blood sugar ranges from 80 to 120 and 2 hours after any meal or at bedtime should be between 130 to 170.   Please attend class to re learn how to eat, this is BASIC for blood sugar control   Start glipizide 2.5 mg long acting once daily, and lantus 40units once daily  Call if you have concerns if blood sugar is staying above 200, or below 80  You nEED to eat on schedule at least 3 times every day  Drink 60 ounces water daily

## 2012-05-15 NOTE — Assessment & Plan Note (Signed)
Unchanged. Patient counseled for approximately 5 minutes regarding the health risks of ongoing nicotine use, specifically all types of cancer, heart disease, stroke and respiratory failure. The options available for help with cessation ,the behavioral changes to assist the process, and the option to either gradully reduce usage  Or abruptly stop.is also discussed. Pt is also encouraged to set specific goals in number of cigarettes used daily, as well as to set a quit date.  

## 2012-05-15 NOTE — Assessment & Plan Note (Signed)
Unchanged. Patient re-educated about  the importance of commitment to a  minimum of 150 minutes of exercise per week. The importance of healthy food choices with portion control discussed. Encouraged to start a food diary, count calories and to consider  joining a support group. Sample diet sheets offered. Goals set by the patient for the next several months.    

## 2012-05-15 NOTE — Assessment & Plan Note (Signed)
Hyperlipidemia:Low fat diet discussed and encouraged.  Updated lab needed, controlled when last checked

## 2012-05-15 NOTE — Assessment & Plan Note (Signed)
Improved, followed closely by psychology

## 2012-05-15 NOTE — Assessment & Plan Note (Signed)
Controlled, no change in medication DASH diet and commitment to daily physical activity for a minimum of 30 minutes discussed and encouraged, as a part of hypertension management. The importance of attaining a healthy weight is also discussed.  

## 2012-05-15 NOTE — Assessment & Plan Note (Signed)
Controlled, no change in medication  

## 2012-05-15 NOTE — Assessment & Plan Note (Signed)
Deterroated, pt non compliant, needs to see endo but still resisting, will give her 1 last chance. Made it clear that compliance with diet and medication is crucial for success

## 2012-05-28 ENCOUNTER — Telehealth: Payer: Self-pay | Admitting: Family Medicine

## 2012-05-30 ENCOUNTER — Other Ambulatory Visit: Payer: Self-pay | Admitting: Family Medicine

## 2012-05-31 ENCOUNTER — Other Ambulatory Visit: Payer: Self-pay | Admitting: Family Medicine

## 2012-05-31 MED ORDER — SULFAMETHOXAZOLE-TRIMETHOPRIM 800-160 MG PO TABS: 1.0000 | ORAL_TABLET | Freq: Two times a day (BID) | ORAL | Status: DC

## 2012-05-31 NOTE — Telephone Encounter (Signed)
Pt aware that septra sent in today

## 2012-06-01 ENCOUNTER — Telehealth (HOSPITAL_COMMUNITY): Payer: Self-pay | Admitting: Dietician

## 2012-06-01 NOTE — Telephone Encounter (Signed)
Pt called and registered for DM class on 06/11/11 at 5:30 PM.

## 2012-06-10 ENCOUNTER — Encounter (HOSPITAL_COMMUNITY): Payer: Self-pay | Admitting: Dietician

## 2012-06-10 NOTE — Progress Notes (Signed)
Pt was a no-show for diabetes class on 06/11/11 at 5:30 PM.

## 2012-06-14 ENCOUNTER — Encounter (HOSPITAL_COMMUNITY): Payer: Self-pay | Admitting: Psychology

## 2012-06-14 ENCOUNTER — Ambulatory Visit (INDEPENDENT_AMBULATORY_CARE_PROVIDER_SITE_OTHER): Payer: Medicare Other | Admitting: Psychology

## 2012-06-14 DIAGNOSIS — F332 Major depressive disorder, recurrent severe without psychotic features: Secondary | ICD-10-CM

## 2012-06-14 DIAGNOSIS — F4001 Agoraphobia with panic disorder: Secondary | ICD-10-CM

## 2012-06-14 NOTE — Progress Notes (Signed)
Patient:  [name]   DOB: 01/15/1951  MR Number: [id number]  Location: BEHAVIORAL [facility] [address] [place] [address] Dept: [phone]  Start: 2 PM End:  3 PM  Provider/Observer:     Hershal Coria PSYD  Chief Complaint:      Chief Complaint  Patient presents with  . Anxiety  . Depression  . Stress    Reason For Service:     The patient was referred for psychotherapeutic interventions because of ongoing symptoms of depression, anxiety, and mood swings including anger outbursts particularly of a verbal nature.  Interventions Strategy:  Cognitive/behavioral psychotherapeutic interventions  Participation Level:   Active  Participation Quality:  Appropriate      Behavioral Observation:  Well Groomed, Alert, and Appropriate.   Current Psychosocial Factors:  the patient reports that she was able to get through the holidays but were very difficult for her after coping with the death of her husband and this being the first holiday season since he passed away. The patient reports that she was able to get through it and the issues of guilt and survivors guilt type of issues have been continuing to get better.  Content of Session:   Review current symptoms and continued work on therapeutic interventions for issues of recurrent depression and panic attacks.  Current Status:   T the patient reports that she is continuing to improve as far as her coping skills and strategies and her symptoms of depression and anxiety have been improving   Patient Progress:   Very good considering the situation.  Target Goals:   Target goals have to do with reducing the intensity, duration, and frequency of depressive events as well as reducing the intensity, duration, and frequency of panic attacks. Target goals include building coping skills and strategies around both of these diagnostic issues.  Last  Reviewed:    06/14/2012  Goals Addressed Today:    Specific goals addressed today had to do with managing and coping with her cognitive and behavioral responses to stressors particularly around issues that may lead to full-blown panic attacks when she is in medical situations. These have been particular triggers for her in the past for panic attacks due to her medical illnesses and past medical treatments.  Impression/Diagnosis:   The patient has had a lot of significant medical issues to deal with particularly regarding pulmonary function. However, she also has a long history of depression and panic attacks have become exacerbated due to to persistent and serious medical issues.  Diagnosis:    Axis I:  1. Major depressive disorder, recurrent severe without psychotic features   2. Panic disorder with agoraphobia and moderate panic attacks         Axis II: No diagnosis

## 2012-06-17 ENCOUNTER — Ambulatory Visit: Payer: Medicare Other | Admitting: Family Medicine

## 2012-06-18 ENCOUNTER — Encounter: Payer: Self-pay | Admitting: Family Medicine

## 2012-06-21 ENCOUNTER — Ambulatory Visit (INDEPENDENT_AMBULATORY_CARE_PROVIDER_SITE_OTHER): Payer: Medicare Other | Admitting: Family Medicine

## 2012-06-21 ENCOUNTER — Encounter: Payer: Self-pay | Admitting: Family Medicine

## 2012-06-21 VITALS — BP 132/80 | HR 96 | Resp 20 | Ht 64.0 in | Wt 264.1 lb

## 2012-06-21 DIAGNOSIS — E039 Hypothyroidism, unspecified: Secondary | ICD-10-CM

## 2012-06-21 DIAGNOSIS — F329 Major depressive disorder, single episode, unspecified: Secondary | ICD-10-CM | POA: Diagnosis not present

## 2012-06-21 DIAGNOSIS — F3289 Other specified depressive episodes: Secondary | ICD-10-CM

## 2012-06-21 DIAGNOSIS — E785 Hyperlipidemia, unspecified: Secondary | ICD-10-CM

## 2012-06-21 DIAGNOSIS — F17200 Nicotine dependence, unspecified, uncomplicated: Secondary | ICD-10-CM | POA: Diagnosis not present

## 2012-06-21 DIAGNOSIS — J4489 Other specified chronic obstructive pulmonary disease: Secondary | ICD-10-CM

## 2012-06-21 DIAGNOSIS — J449 Chronic obstructive pulmonary disease, unspecified: Secondary | ICD-10-CM

## 2012-06-21 DIAGNOSIS — I10 Essential (primary) hypertension: Secondary | ICD-10-CM

## 2012-06-21 DIAGNOSIS — IMO0001 Reserved for inherently not codable concepts without codable children: Secondary | ICD-10-CM

## 2012-06-21 DIAGNOSIS — E1065 Type 1 diabetes mellitus with hyperglycemia: Secondary | ICD-10-CM

## 2012-06-21 MED ORDER — FLUOXETINE HCL 40 MG PO CAPS: 40.0000 mg | ORAL_CAPSULE | Freq: Every day | ORAL | Status: DC

## 2012-06-21 NOTE — Assessment & Plan Note (Signed)
Controlled, no change in medication Increase physical activity as tolerated

## 2012-06-21 NOTE — Patient Instructions (Addendum)
F/u in 6 to 7 weeks  Fasting lipid, cmp and EGFr and hBA1C, in 7 weeks, TSH  Blood sugars in January look excellent.  Fluoxetine dose will be increased to 40mg , ok to take two fluoxetine 20mg  tablets once daily till done  Please stop feeling guilty you were a great support for your husband  Keep on testing sugars regularly, call if uncontrolled. Continue to take meds daily as precribed

## 2012-06-21 NOTE — Assessment & Plan Note (Signed)
Improved , but still uncontrolled, pt has guilt and is tearful, sees therapist every 3 weeks. Dose increase on fluoxetine

## 2012-06-21 NOTE — Assessment & Plan Note (Signed)
Controlled, no change in medication DASH diet and commitment to daily physical activity for a minimum of 30 minutes discussed and encouraged, as a part of hypertension management. The importance of attaining a healthy weight is also discussed.  

## 2012-06-21 NOTE — Progress Notes (Signed)
  Subjective:    Patient ID: [name], female    DOB: [birth year], 62 y.o.   MRN: [id number]  HPI Pt in primarily for re evaluation of uncontrolled IDDM.She has both her meter and her log, has been testing twice daily, and in [place] her numbers are excellent, not as good in Nov and December which she states were difficult and challenging months for her mentally and emotionally Not suicidal or homicidal, but tearful and overwhelmed with guilt  Denies any recent fever , chills or excessive cough and sputum production Denies polyuria, polydipsia or blurred vision, reports feeling "weak" at times, though not hypoglycemic , blood sugars are relatively low for her   Review of Systems See HPI Denies recent fever or chills. Denies sinus pressure, nasal congestion, ear pain or sore throat. Denies chest congestion, productive cough or wheezing. Denies chest pains, palpitations and leg swelling Denies abdominal pain, nausea, vomiting,diarrhea or constipation.   Denies dysuria, frequency, hesitancy or incontinence. Chronic  joint pain, swelling and limitation in mobility. Denies headaches, seizures, numbness, or tingling. Denies skin break down or rash.        Objective:   Physical Exam Patient alert and oriented and in no cardiopulmonary distress.Tearful  HEENT: No facial asymmetry, EOMI, no sinus tenderness,  oropharynx pink and moist.  Neck supple no adenopathy.  Chest: Clear to auscultation bilaterally.decreased throughout  CVS: S1, S2 no murmurs, no S3.  ABD: Soft non tender. Bowel sounds normal.  Ext: No edema  MS: decreased ROM spine, shoulders, hips and knees.  Skin: Intact, no ulcerations or rash noted.  Psych: Good eye contact, normal affect. Memory intact , anxious, tearful and depressed appearing  CNS: CN 2-12 intact, power, tone and sensation normal throughout.        Assessment & Plan:

## 2012-06-21 NOTE — Assessment & Plan Note (Signed)
Controlled, no change in medication  

## 2012-06-21 NOTE — Assessment & Plan Note (Signed)
Worsening due to ongoing nicotine use  

## 2012-06-21 NOTE — Assessment & Plan Note (Signed)
Improved based on log provided, pt also has meter with her. In January recorded values are within range . She is testing twice daily and taking meds per order.November and December were challenging due to uncontrolled depression , flashbacks and guilt surrounding her husband's death

## 2012-06-21 NOTE — Assessment & Plan Note (Signed)
Gradually improving  Patient counseled for approximately 5 minutes regarding the health risks of ongoing nicotine use, specifically all types of cancer, heart disease, stroke and respiratory failure. The options available for help with cessation ,the behavioral changes to assist the process, and the option to either gradully reduce usage  Or abruptly stop.is also discussed. Pt is also encouraged to set specific goals in number of cigarettes used daily, as well as to set a quit date.

## 2012-07-02 ENCOUNTER — Other Ambulatory Visit: Payer: Self-pay | Admitting: Family Medicine

## 2012-07-05 ENCOUNTER — Telehealth: Payer: Self-pay | Admitting: Family Medicine

## 2012-07-05 ENCOUNTER — Other Ambulatory Visit: Payer: Self-pay

## 2012-07-05 MED ORDER — LEVOTHYROXINE SODIUM 100 MCG PO TABS: 100.0000 ug | ORAL_TABLET | Freq: Every day | ORAL | Status: DC

## 2012-07-05 MED ORDER — HYDROCODONE-ACETAMINOPHEN 10-500 MG PO TABS: 1.0000 | ORAL_TABLET | Freq: Four times a day (QID) | ORAL | Status: DC | PRN

## 2012-07-05 MED ORDER — HYDROCODONE-ACETAMINOPHEN 10-325 MG PO TABS: 1.0000 | ORAL_TABLET | Freq: Four times a day (QID) | ORAL | Status: DC | PRN

## 2012-07-05 NOTE — Telephone Encounter (Signed)
Refills sent in

## 2012-07-16 ENCOUNTER — Ambulatory Visit (HOSPITAL_COMMUNITY): Payer: Self-pay | Admitting: Psychology

## 2012-07-22 ENCOUNTER — Ambulatory Visit (HOSPITAL_COMMUNITY): Payer: Self-pay | Admitting: Psychology

## 2012-08-02 ENCOUNTER — Other Ambulatory Visit: Payer: Self-pay | Admitting: Family Medicine

## 2012-08-10 DIAGNOSIS — E669 Obesity, unspecified: Secondary | ICD-10-CM | POA: Diagnosis not present

## 2012-08-10 DIAGNOSIS — J449 Chronic obstructive pulmonary disease, unspecified: Secondary | ICD-10-CM | POA: Diagnosis not present

## 2012-08-10 DIAGNOSIS — I10 Essential (primary) hypertension: Secondary | ICD-10-CM | POA: Diagnosis not present

## 2012-08-10 DIAGNOSIS — E109 Type 1 diabetes mellitus without complications: Secondary | ICD-10-CM | POA: Diagnosis not present

## 2012-08-17 ENCOUNTER — Ambulatory Visit: Payer: Medicare Other | Admitting: Family Medicine

## 2012-08-23 ENCOUNTER — Telehealth: Payer: Self-pay

## 2012-08-23 ENCOUNTER — Ambulatory Visit (INDEPENDENT_AMBULATORY_CARE_PROVIDER_SITE_OTHER): Payer: Medicare Other | Admitting: Psychology

## 2012-08-23 DIAGNOSIS — E039 Hypothyroidism, unspecified: Secondary | ICD-10-CM | POA: Diagnosis not present

## 2012-08-23 DIAGNOSIS — F4001 Agoraphobia with panic disorder: Secondary | ICD-10-CM

## 2012-08-23 DIAGNOSIS — F332 Major depressive disorder, recurrent severe without psychotic features: Secondary | ICD-10-CM

## 2012-08-23 DIAGNOSIS — E785 Hyperlipidemia, unspecified: Secondary | ICD-10-CM | POA: Diagnosis not present

## 2012-08-23 DIAGNOSIS — E1065 Type 1 diabetes mellitus with hyperglycemia: Secondary | ICD-10-CM | POA: Diagnosis not present

## 2012-08-23 NOTE — Telephone Encounter (Signed)
Had to order labs for pt

## 2012-08-24 LAB — COMPLETE METABOLIC PANEL WITH GFR
ALT: 9 U/L (ref 0–35)
Albumin: 3.8 g/dL (ref 3.5–5.2)
CO2: 33 mEq/L — ABNORMAL HIGH (ref 19–32)
Calcium: 9.2 mg/dL (ref 8.4–10.5)
Chloride: 105 mEq/L (ref 96–112)
GFR, Est African American: 55 mL/min — ABNORMAL LOW
GFR, Est Non African American: 47 mL/min — ABNORMAL LOW
Glucose, Bld: 119 mg/dL — ABNORMAL HIGH (ref 70–99)
Potassium: 5.4 mEq/L — ABNORMAL HIGH (ref 3.5–5.3)
Sodium: 140 mEq/L (ref 135–145)
Total Protein: 6.3 g/dL (ref 6.0–8.3)

## 2012-08-24 LAB — LIPID PANEL
Cholesterol: 138 mg/dL (ref 0–200)
HDL: 47 mg/dL (ref >39)
LDL Cholesterol: 78 mg/dL (ref 0–99)
Triglycerides: 66 mg/dL (ref <150)

## 2012-08-30 ENCOUNTER — Other Ambulatory Visit: Payer: Self-pay | Admitting: Family Medicine

## 2012-08-30 ENCOUNTER — Ambulatory Visit (INDEPENDENT_AMBULATORY_CARE_PROVIDER_SITE_OTHER): Payer: Medicare Other | Admitting: Family Medicine

## 2012-08-30 ENCOUNTER — Encounter: Payer: Self-pay | Admitting: Family Medicine

## 2012-08-30 VITALS — BP 120/80 | HR 86 | Resp 18 | Ht 64.0 in | Wt 274.1 lb

## 2012-08-30 DIAGNOSIS — I10 Essential (primary) hypertension: Secondary | ICD-10-CM | POA: Diagnosis not present

## 2012-08-30 DIAGNOSIS — E1065 Type 1 diabetes mellitus with hyperglycemia: Secondary | ICD-10-CM | POA: Diagnosis not present

## 2012-08-30 DIAGNOSIS — J449 Chronic obstructive pulmonary disease, unspecified: Secondary | ICD-10-CM | POA: Diagnosis not present

## 2012-08-30 DIAGNOSIS — F329 Major depressive disorder, single episode, unspecified: Secondary | ICD-10-CM

## 2012-08-30 DIAGNOSIS — E669 Obesity, unspecified: Secondary | ICD-10-CM

## 2012-08-30 DIAGNOSIS — E039 Hypothyroidism, unspecified: Secondary | ICD-10-CM

## 2012-08-30 DIAGNOSIS — F17200 Nicotine dependence, unspecified, uncomplicated: Secondary | ICD-10-CM

## 2012-08-30 DIAGNOSIS — M479 Spondylosis, unspecified: Secondary | ICD-10-CM

## 2012-08-30 DIAGNOSIS — K219 Gastro-esophageal reflux disease without esophagitis: Secondary | ICD-10-CM

## 2012-08-30 NOTE — Patient Instructions (Addendum)
Annual wellness  in 4 month   Please work on smoking cessation..  Blood pressure and labs are excellent, I am VERY proud of you, keep it up.  Sorry about your recent loss, please continue to see therapist regularly  Microalb today  HBA1C, non fasting amp and EGFr and TSH CBc in 4 month  You are referred for eye exam

## 2012-08-31 ENCOUNTER — Encounter (HOSPITAL_COMMUNITY): Payer: Self-pay | Admitting: Psychology

## 2012-08-31 LAB — MICROALBUMIN / CREATININE URINE RATIO: Microalb, Ur: 0.57 mg/dL (ref 0.00–1.89)

## 2012-08-31 NOTE — Progress Notes (Signed)
Patient:  [name]   DOB: 1950/06/23  MR Number: [id number]  Location: BEHAVIORAL [facility] [address] [place] [address] Dept: [phone]  Start: 2 PM End:  3 PM  Provider/Observer:     Hershal Coria PSYD  Chief Complaint:      Chief Complaint  Patient presents with  . Anxiety  . Depression  . Stress    Reason For Service:     The patient was referred for psychotherapeutic interventions because of ongoing symptoms of depression, anxiety, and mood swings including anger outbursts particularly of a verbal nature.  Interventions Strategy:  Cognitive/behavioral psychotherapeutic interventions  Participation Level:   Active  Participation Quality:  Appropriate      Behavioral Observation:  Well Groomed, Alert, and Appropriate.   Current Psychosocial Factors: The patient reports that she is continuing to cope better with the death of her husband and is managing her house on her on. However, other family illness recently as happened. The patient reports that her sister has passed away do to medical complications and this is been a fairly sad time for her and brought up feelings about the death of her husband.  Content of Session:   Review current symptoms and continued work on therapeutic interventions for issues of recurrent depression and panic attacks.  Current Status:   T the patient reports that she is continuing to improve as far as her coping skills and strategies and her symptoms of depression and anxiety have been improving   Patient Progress:   Very good considering the situation.  Target Goals:   Target goals have to do with reducing the intensity, duration, and frequency of depressive events as well as reducing the intensity, duration, and frequency of panic attacks. Target goals include building coping skills and strategies around both of these diagnostic issues.  Last  Reviewed:    08/23/2012  Goals Addressed Today:    Specific goals addressed today had to do with managing and coping with her cognitive and behavioral responses to stressors particularly around issues that may lead to full-blown panic attacks when she is in medical situations. These have been particular triggers for her in the past for panic attacks due to her medical illnesses and past medical treatments.  Impression/Diagnosis:   The patient has had a lot of significant medical issues to deal with particularly regarding pulmonary function. However, she also has a long history of depression and panic attacks have become exacerbated due to to persistent and serious medical issues.  Diagnosis:    Axis I:  Major depressive disorder, recurrent severe without psychotic features  Panic disorder with agoraphobia and moderate panic attacks      Axis II: No diagnosis

## 2012-09-01 ENCOUNTER — Other Ambulatory Visit: Payer: Self-pay

## 2012-09-01 DIAGNOSIS — F329 Major depressive disorder, single episode, unspecified: Secondary | ICD-10-CM

## 2012-09-01 MED ORDER — ZOLPIDEM TARTRATE 10 MG PO TABS: 10.0000 mg | ORAL_TABLET | Freq: Every evening | ORAL | Status: DC | PRN

## 2012-09-01 MED ORDER — ROSUVASTATIN CALCIUM 20 MG PO TABS: 20.0000 mg | ORAL_TABLET | Freq: Every day | ORAL | Status: DC

## 2012-09-01 MED ORDER — HYDROCODONE-ACETAMINOPHEN 10-325 MG PO TABS: 1.0000 | ORAL_TABLET | Freq: Four times a day (QID) | ORAL | Status: DC | PRN

## 2012-09-01 MED ORDER — MONTELUKAST SODIUM 10 MG PO TABS: 10.0000 mg | ORAL_TABLET | Freq: Every day | ORAL | Status: DC

## 2012-09-01 MED ORDER — FLUOXETINE HCL 40 MG PO CAPS: 40.0000 mg | ORAL_CAPSULE | Freq: Every day | ORAL | Status: DC

## 2012-09-01 MED ORDER — FLUTICASONE-SALMETEROL 250-50 MCG/DOSE IN AEPB: INHALATION_SPRAY | RESPIRATORY_TRACT | Status: DC

## 2012-09-01 MED ORDER — ESOMEPRAZOLE MAGNESIUM 40 MG PO CPDR: 40.0000 mg | DELAYED_RELEASE_CAPSULE | Freq: Every day | ORAL | Status: DC

## 2012-09-02 ENCOUNTER — Other Ambulatory Visit: Payer: Self-pay | Admitting: Family Medicine

## 2012-09-07 ENCOUNTER — Telehealth: Payer: Self-pay | Admitting: Family Medicine

## 2012-09-07 NOTE — Telephone Encounter (Signed)
Response returned to diabetic supply company on 4/7

## 2012-09-12 NOTE — Assessment & Plan Note (Signed)
Improved and controlled Pt  Applauded on this and encouraged to contine Patient advised to reduce carb and sweets, commit to regular physical activity, take meds as prescribed, test blood as directed, and attempt to lose weight, to improve blood sugar control.

## 2012-09-12 NOTE — Assessment & Plan Note (Signed)
Deteriorated ,states increased stress due to sudden unexpected loss of her sister Patient counseled for approximately 5 minutes regarding the health risks of ongoing nicotine use, specifically all types of cancer, heart disease, stroke and respiratory failure. The options available for help with cessation ,the behavioral changes to assist the process, and the option to either gradully reduce usage  Or abruptly stop.is also discussed. Pt is also encouraged to set specific goals in number of cigarettes used daily, as well as to set a quit date.

## 2012-09-12 NOTE — Assessment & Plan Note (Addendum)
Deteriorating, increased nicotine dependence and more symptomatic, encouraged to stop smoking, unable at thsi time reportedly Continue supplemental oxygen, followed by pulmonary also

## 2012-09-12 NOTE — Assessment & Plan Note (Signed)
Controlled, no change in medication  

## 2012-09-12 NOTE — Assessment & Plan Note (Signed)
Deteriorated due to grief due to recent loss of her sibling, receiving intensive therapy which is helping Not suicidal or homicidal

## 2012-09-12 NOTE — Assessment & Plan Note (Signed)
Severe and debilitating ambulates with walker

## 2012-09-12 NOTE — Assessment & Plan Note (Signed)
Controlled, no change in medication DASH diet and commitment to daily physical activity for a minimum of 30 minutes discussed and encouraged, as a part of hypertension management. The importance of attaining a healthy weight is also discussed.  

## 2012-09-12 NOTE — Progress Notes (Signed)
  Subjective:    Patient ID: [name], female    DOB: Sep 28, 1950, 62 y.o.   MRN: [id number]  HPI The PT is here for follow up and re-evaluation of chronic medical conditions, medication management and review of any available recent lab and radiology data.  Preventive health is updated, specifically  Cancer screening and Immunization.   Questions or concerns regarding consultations or procedures which the PT has had in the interim are  addressed. The PT denies any adverse reactions to current medications since the last visit.  Increased stress and depression due to the unexpected death of a sibling, getting help from her therapist Increased dyspnea noted, no fever chills or cough, though reports increased nicotine and unwilling to quit Has been diligent with diet and medication, no hypo or hyperglycemic episodes, blood sugars  excellent    Review of Systems See HPI Denies recent fever or chills. Denies sinus pressure, nasal congestion, ear pain or sore throat. Denies chest pains, palpitations and leg swelling Denies abdominal pain, nausea, vomiting,diarrhea or constipation.   Denies dysuria, frequency, hesitancy or incontinence. Chronic joint pain with reduced mobility, needs walker for ambulation Denies headaches, seizures, numbness, or tingling. . Denies skin break down or rash.        Objective:   Physical Exam  Patient alert and oriented and in no cardiopulmonary distress.Tearful and sad as she recalls sibling's death  HEENT: No facial asymmetry, EOMI, no sinus tenderness,  oropharynx pink and moist.  Neck supple no adenopathy.  Chest: decreased air entry, scattered crackles and wheezes  CVS: S1, S2 no murmurs, no S3.  ABD: Soft non tender. Bowel sounds normal.  Ext: No edema  MS: decreased  ROM spine, shoulders, hips and knees.  Skin: Intact, no ulcerations or rash noted.  Psych: Good eye contact, normal affect. Memory intact mildly anxious and  depressed  appearing.  CNS: CN 2-12 intact.       Assessment & Plan:

## 2012-09-12 NOTE — Assessment & Plan Note (Signed)
Deteriorated. Patient re-educated about  the importance of commitment to a  minimum of 150 minutes of exercise per week. The importance of healthy food choices with portion control discussed. Encouraged to start a food diary, count calories and to consider  joining a support group. Sample diet sheets offered. Goals set by the patient for the next several months.    

## 2012-09-23 ENCOUNTER — Ambulatory Visit (HOSPITAL_COMMUNITY): Payer: Self-pay | Admitting: Psychology

## 2012-09-30 ENCOUNTER — Other Ambulatory Visit: Payer: Self-pay | Admitting: Family Medicine

## 2012-10-20 ENCOUNTER — Ambulatory Visit: Payer: Self-pay | Admitting: Family Medicine

## 2012-10-28 ENCOUNTER — Other Ambulatory Visit: Payer: Self-pay | Admitting: Family Medicine

## 2012-11-02 ENCOUNTER — Ambulatory Visit (HOSPITAL_COMMUNITY): Payer: Self-pay | Admitting: Psychology

## 2012-11-08 ENCOUNTER — Ambulatory Visit (INDEPENDENT_AMBULATORY_CARE_PROVIDER_SITE_OTHER): Payer: Medicare Other | Admitting: Psychology

## 2012-11-08 DIAGNOSIS — F332 Major depressive disorder, recurrent severe without psychotic features: Secondary | ICD-10-CM | POA: Diagnosis not present

## 2012-11-08 DIAGNOSIS — F4001 Agoraphobia with panic disorder: Secondary | ICD-10-CM

## 2012-11-18 ENCOUNTER — Ambulatory Visit (INDEPENDENT_AMBULATORY_CARE_PROVIDER_SITE_OTHER): Payer: Medicare Other | Admitting: Family Medicine

## 2012-11-18 ENCOUNTER — Encounter: Payer: Self-pay | Admitting: Family Medicine

## 2012-11-18 VITALS — BP 140/86 | HR 79 | Resp 16 | Ht 64.0 in | Wt 261.8 lb

## 2012-11-18 DIAGNOSIS — E669 Obesity, unspecified: Secondary | ICD-10-CM

## 2012-11-18 DIAGNOSIS — B351 Tinea unguium: Secondary | ICD-10-CM | POA: Insufficient documentation

## 2012-11-18 DIAGNOSIS — E1065 Type 1 diabetes mellitus with hyperglycemia: Secondary | ICD-10-CM | POA: Diagnosis not present

## 2012-11-18 DIAGNOSIS — J449 Chronic obstructive pulmonary disease, unspecified: Secondary | ICD-10-CM

## 2012-11-18 DIAGNOSIS — Z9981 Dependence on supplemental oxygen: Secondary | ICD-10-CM

## 2012-11-18 DIAGNOSIS — I10 Essential (primary) hypertension: Secondary | ICD-10-CM

## 2012-11-18 DIAGNOSIS — F17200 Nicotine dependence, unspecified, uncomplicated: Secondary | ICD-10-CM

## 2012-11-18 DIAGNOSIS — B3789 Other sites of candidiasis: Secondary | ICD-10-CM | POA: Insufficient documentation

## 2012-11-18 DIAGNOSIS — F329 Major depressive disorder, single episode, unspecified: Secondary | ICD-10-CM

## 2012-11-18 MED ORDER — NYSTATIN 100000 UNIT/GM EX POWD: CUTANEOUS | Status: AC

## 2012-11-18 MED ORDER — TERBINAFINE HCL 250 MG PO TABS: 250.0000 mg | ORAL_TABLET | Freq: Every day | ORAL | Status: DC

## 2012-11-18 NOTE — Progress Notes (Signed)
  Subjective:    Patient ID: [name], female    DOB: 21-Apr-1951, 62 y.o.   MRN: [id number]  HPI Pt in primarily for re evaluation for oxygen needs, she has severe COPD, continues to smoke 1PPD and has no plan to quit at this time. She depends on 4 liters oxygen at rest to maintain an O2 sat of 95%, off oxygen at rest , her pulse ox is 84 % so she clearly continues to need supplemental oxygen. With activity (minimal) her oxygen saturation falls to 84% without oxygen, while it is maintained at 94% with the same level of activity on oxygen 4liter/minute Grieving more loss, a niece and a sister and is working on the process, May has been a difficult month for her , she continues in regular therapy, but states that she knows her blood sugars have not been as well controlled as in the recent past    Review of Systems See HPI Denies recent fever or chills. Denies sinus pressure, nasal congestion, ear pain or sore throat. Denies chest congestion, productive cough or wheezing.Chronic exertional fatigue with oxygen dependence Denies chest pains, palpitations and leg swelling Denies abdominal pain, nausea, vomiting,diarrhea or constipation.   Denies dysuria, frequency, hesitancy or incontinence. Joint stiffness with limitation in mobility, unchanged. Denies headaches, seizures, numbness, or tingling. Increased  Depression and  anxiety in the past month, but not suicidal or homicidal Denies skin break down or rash.        Objective:   Physical Exam  Patient alert and oriented oxygen dependent, and has limited exercise tolerance even with supplemental oxygen  HEENT: No facial asymmetry, EOMI, no sinus tenderness,  oropharynx pink and moist.  Neck supple no adenopathy.  Chest:decreased air entry throughout n crackles or wheezes on exam  CVS: S1, S2 no murmurs, no S3.  ABD: Soft non tender. Bowel sounds normal.  Ext: No edema  MS: decreased  ROM spine, shoulders, hips and  knees.  Skin: tinea pedis and onychomycosis bilaterally, candidiasis of breast  Psych: Good eye contact, normal affect. Memory intact not anxious tearful and mildly  depressed appearing.  CNS: CN 2-12 intact, power,  normal throughout.       Assessment & Plan:

## 2012-11-18 NOTE — Patient Instructions (Addendum)
F/u  As before  You are referred for diabetic eye exam to Dr Lita Mains.  You need to reconsider ongoing nicotine use, you need to quit to reduce your risk of all types of cancer, stroke and heart diseasee

## 2012-11-18 NOTE — Assessment & Plan Note (Addendum)
1 ppd in November 18, 2012, no interest in quitting at this time  Patient counseled for approximately 5 minutes regarding the health risks of ongoing nicotine use, specifically all types of cancer, heart disease, stroke and respiratory failure. The options available for help with cessation ,the behavioral changes to assist the process, and the option to either gradully reduce usage  Or abruptly stop.is also discussed. Pt is also encouraged to set specific goals in number of cigarettes used daily, as well as to set a quit date.

## 2012-11-19 DIAGNOSIS — Z9981 Dependence on supplemental oxygen: Secondary | ICD-10-CM | POA: Insufficient documentation

## 2012-11-19 NOTE — Assessment & Plan Note (Signed)
Controlled, no change in medication  

## 2012-11-19 NOTE — Assessment & Plan Note (Signed)
Severe bilateral and tinea pedis , terbinafine prescribed

## 2012-11-19 NOTE — Assessment & Plan Note (Signed)
Improved. Pt applauded on succesful weight loss through lifestyle change, and encouraged to continue same. Weight loss goal set for the next several months.  

## 2012-11-19 NOTE — Assessment & Plan Note (Signed)
Patient's condition continues to deteriorated due to ongoing nicotine use which she has no intention of quitting at this time. She relies on supplemental oxygen at 4 liters per minute to function. She is compliant with her oxygen, and would be non functional and is actually unable to manage without this

## 2012-11-19 NOTE — Assessment & Plan Note (Signed)
Patient advised to reduce carb and sweets, commit to regular physical activity, take meds as prescribed, test blood as directed, and attempt to lose weight, to improve blood sugar control. Updated lab next visit 

## 2012-11-19 NOTE — Assessment & Plan Note (Signed)
Topical nystatin for as needed, use

## 2012-11-19 NOTE — Assessment & Plan Note (Signed)
Ongoing need for supplemental oxygen is established

## 2012-11-19 NOTE — Assessment & Plan Note (Signed)
Increased symtoms due to recurrent losses in the past year, not suicidal or homicidal

## 2012-11-22 ENCOUNTER — Encounter: Payer: Self-pay | Admitting: Family Medicine

## 2012-11-26 ENCOUNTER — Other Ambulatory Visit: Payer: Self-pay | Admitting: Family Medicine

## 2012-11-29 ENCOUNTER — Encounter (HOSPITAL_COMMUNITY): Payer: Self-pay | Admitting: Psychology

## 2012-11-29 NOTE — Progress Notes (Signed)
Patient:  [name]   DOB: Oct 27, 1950  MR Number: [id number]  Location: BEHAVIOR[facility] [address] [place] [address] Dept: [phone]  Start: 3 PM End: 4 PM  Provider/Observer:     Hershal Coria PSYD  Chief Complaint:      Chief Complaint  Patient presents with  . Depression    Reason For Service:     The patient was referred for psychotherapeutic interventions because of ongoing symptoms of depression, anxiety, and mood swings including anger outbursts particularly of a verbal nature.  Interventions Strategy:  Cognitive/behavioral psychotherapeutic interventions  Participation Level:   Active  Participation Quality:  Appropriate      Behavioral Observation:  Well Groomed, Alert, and Appropriate.   Current Psychosocial Factors: The patient reports that she is starting to get out of her house more often and does feel like she is gaining some more Darcus Austin with her life.  Content of Session:   Review current symptoms and continued work on therapeutic interventions for issues of recurrent depression and panic attacks.  Current Status:   The patient reports that she is continuing to improve as far as her symptoms of depression and anxiety.   Patient Progress:   Very good considering the situation.  Target Goals:   Target goals have to do with reducing the intensity, duration, and frequency of depressive events as well as reducing the intensity, duration, and frequency of panic attacks. Target goals include building coping skills and strategies around both of these diagnostic issues.  Last Reviewed:    11/08/2012  Goals Addressed Today:    Specific goals addressed today had to do with managing and coping with her cognitive and behavioral responses to stressors particularly around issues that may lead to full-blown panic attacks when she is in medical situations. These have been  particular triggers for her in the past for panic attacks due to her medical illnesses and past medical treatments.  Impression/Diagnosis:   The patient has had a lot of significant medical issues to deal with particularly regarding pulmonary function. However, she also has a long history of depression and panic attacks have become exacerbated due to to persistent and serious medical issues.  Diagnosis:    Axis I:  Major depressive disorder, recurrent severe without psychotic features  Panic disorder with agoraphobia and moderate panic attacks      Axis II: No diagnosis

## 2012-12-09 ENCOUNTER — Other Ambulatory Visit: Payer: Self-pay

## 2012-12-10 ENCOUNTER — Ambulatory Visit (HOSPITAL_COMMUNITY): Payer: Medicare Other | Admitting: Psychology

## 2012-12-24 DIAGNOSIS — E039 Hypothyroidism, unspecified: Secondary | ICD-10-CM | POA: Diagnosis not present

## 2012-12-24 DIAGNOSIS — E1065 Type 1 diabetes mellitus with hyperglycemia: Secondary | ICD-10-CM | POA: Diagnosis not present

## 2012-12-24 LAB — CBC
MCV: 89.6 fL (ref 78.0–100.0)
Platelets: 278 10*3/uL (ref 150–400)
RDW: 14.3 % (ref 11.5–15.5)
WBC: 10.9 10*3/uL — ABNORMAL HIGH (ref 4.0–10.5)

## 2012-12-25 LAB — COMPLETE METABOLIC PANEL WITH GFR
BUN: 35 mg/dL — ABNORMAL HIGH (ref 6–23)
CO2: 29 mEq/L (ref 19–32)
Calcium: 9.6 mg/dL (ref 8.4–10.5)
Chloride: 101 mEq/L (ref 96–112)
Creat: 1.7 mg/dL — ABNORMAL HIGH (ref 0.50–1.10)
GFR, Est African American: 37 mL/min — ABNORMAL LOW

## 2012-12-25 LAB — TSH: TSH: 4.425 u[IU]/mL (ref 0.350–4.500)

## 2012-12-25 LAB — HEMOGLOBIN A1C: Mean Plasma Glucose: 169 mg/dL — ABNORMAL HIGH (ref <117)

## 2012-12-27 ENCOUNTER — Telehealth: Payer: Self-pay | Admitting: Family Medicine

## 2012-12-27 NOTE — Telephone Encounter (Signed)
Returned phone call and confirmed receipt of form.

## 2012-12-29 ENCOUNTER — Ambulatory Visit (INDEPENDENT_AMBULATORY_CARE_PROVIDER_SITE_OTHER): Payer: Medicare Other | Admitting: Family Medicine

## 2012-12-29 ENCOUNTER — Ambulatory Visit (INDEPENDENT_AMBULATORY_CARE_PROVIDER_SITE_OTHER): Payer: Medicare Other | Admitting: Psychology

## 2012-12-29 ENCOUNTER — Encounter: Payer: Self-pay | Admitting: Family Medicine

## 2012-12-29 ENCOUNTER — Encounter (HOSPITAL_COMMUNITY): Payer: Self-pay | Admitting: Psychology

## 2012-12-29 ENCOUNTER — Other Ambulatory Visit: Payer: Self-pay | Admitting: Family Medicine

## 2012-12-29 ENCOUNTER — Telehealth (HOSPITAL_COMMUNITY): Payer: Self-pay | Admitting: Psychology

## 2012-12-29 VITALS — BP 102/64 | HR 62 | Resp 18 | Ht 64.0 in | Wt 265.0 lb

## 2012-12-29 DIAGNOSIS — F332 Major depressive disorder, recurrent severe without psychotic features: Secondary | ICD-10-CM

## 2012-12-29 DIAGNOSIS — E039 Hypothyroidism, unspecified: Secondary | ICD-10-CM

## 2012-12-29 DIAGNOSIS — E785 Hyperlipidemia, unspecified: Secondary | ICD-10-CM | POA: Diagnosis not present

## 2012-12-29 DIAGNOSIS — F4001 Agoraphobia with panic disorder: Secondary | ICD-10-CM

## 2012-12-29 DIAGNOSIS — E1065 Type 1 diabetes mellitus with hyperglycemia: Secondary | ICD-10-CM | POA: Diagnosis not present

## 2012-12-29 DIAGNOSIS — J449 Chronic obstructive pulmonary disease, unspecified: Secondary | ICD-10-CM | POA: Diagnosis not present

## 2012-12-29 DIAGNOSIS — F17200 Nicotine dependence, unspecified, uncomplicated: Secondary | ICD-10-CM | POA: Diagnosis not present

## 2012-12-29 DIAGNOSIS — I10 Essential (primary) hypertension: Secondary | ICD-10-CM

## 2012-12-29 DIAGNOSIS — IMO0001 Reserved for inherently not codable concepts without codable children: Secondary | ICD-10-CM

## 2012-12-29 MED ORDER — PREDNISONE (PAK) 5 MG PO TABS: 5.0000 mg | ORAL_TABLET | ORAL | Status: DC

## 2012-12-29 NOTE — Patient Instructions (Addendum)
Annual wellness  For 1  Pm appt end October, call if you need me before  Congrats on weight loss.  Blood sugar not as good as it was but i am sure this will improve  Depo medrol 80mg  IM for COPD flare and I am sending in prednisone for 6 days  pLEASE schedule andkeep eye appointment  Recent labs show dehydration. Ensure you drink 64 ounces water daily, and rept non fasting chem 7 next week Wednesday  Please  Fasting lipid,cmp and EGFR HBA!C end October before visit

## 2012-12-29 NOTE — Progress Notes (Signed)
Patient:  [name]   DOB: 11-06-1950  MR Number: [id number]  Location: BEHAVIORAL [facility] [address] [place] [address] Dept: [phone]  Start: 3 PM End: 4 PM  Provider/Observer:     Hershal Coria PSYD  Chief Complaint:      Chief Complaint  Patient presents with  . Anxiety  . Depression  . Stress    Reason For Service:     The patient was referred for psychotherapeutic interventions because of ongoing symptoms of depression, anxiety, and mood swings including anger outbursts particularly of a verbal nature.  Interventions Strategy:  Cognitive/behavioral psychotherapeutic interventions  Participation Level:   Active  Participation Quality:  Appropriate      Behavioral Observation:  Well Groomed, Alert, and Appropriate.   Current Psychosocial Factors: The patient reports that she has been struggling with isolation of some degree but it is very hard for her to get out and do much as her COPD has been worse recently and she thinks is primarily do to he and he made any type of factors.  Content of Session:   Review current symptoms and continued work on therapeutic interventions for issues of recurrent depression and panic attacks.  Current Status:   The patient reports that she has been having more problems with her COPD and when talking with her it sounds like she may be having more sleep apnea issues. I do think that she should be followed up by her pulmonologist but we will continue to talk with her about this.  Patient Progress:   Very good considering the situation.  Target Goals:   Target goals have to do with reducing the intensity, duration, and frequency of depressive events as well as reducing the intensity, duration, and frequency of panic attacks. Target goals include building coping skills and strategies around both of these diagnostic issues.  Last  Reviewed:   12/29/2012  Goals Addressed Today:    Specific goals addressed today had to do with managing and coping with her cognitive and behavioral responses to stressors particularly around issues that may lead to full-blown panic attacks when she is in medical situations. These have been particular triggers for her in the past for panic attacks due to her medical illnesses and past medical treatments.  Impression/Diagnosis:   The patient has had a lot of significant medical issues to deal with particularly regarding pulmonary function. However, she also has a long history of depression and panic attacks have become exacerbated due to to persistent and serious medical issues.  Diagnosis:    Axis I:  Major depressive disorder, recurrent severe without psychotic features  Panic disorder with agoraphobia and moderate panic attacks      Axis II: No diagnosis

## 2012-12-29 NOTE — Progress Notes (Signed)
  Subjective:    Patient ID: [name], female    DOB: [birth year], 62 y.o.   MRN: [id number]  HPI 2 week h/o increased chest congestion, cough and wheeze, Is now on 2nd course of antibiotics from pulmonary based on call in to the office. Has been seeing therapist more often and is anxious for me to review visit note. Still smoking with no commitment to quitting at this time States blood sugar control has fallen off due to increased stress in recent times    Review of Systems See HPI Denies recent fever or chills. Denies sinus pressure, nasal congestion, ear pain or sore throat.  Denies chest pains, palpitations and leg swelling Denies abdominal pain, nausea, vomiting,diarrhea or constipation.   Denies dysuria, frequency, hesitancy or incontinence.  Denies headaches, seizures, numbness, or tingling. . Denies skin break down or rash.        Objective:   Physical Exam   Patient alert and oriented and in no cardiopulmonary distress.  HEENT: No facial asymmetry, EOMI, no sinus tenderness,  oropharynx pink and moist.  Neck supple no adenopathy.  Chest: decreased air entry throughout, wheezes, no crackles  CVS: S1, S2 no murmurs, no S3.  ABD: Soft non tender. Bowel sounds normal.  Ext: No edema  MS: decreased ROM spine, shoulders, hips and knees.  Skin: Intact, no ulcerations or rash noted.  Psych: Good eye contact, normal affect. Memory intact not anxious or depressed appearing.  CNS: CN 2-12 intact, power,  normal throughout.      Assessment & Plan:

## 2012-12-30 ENCOUNTER — Telehealth: Payer: Self-pay | Admitting: Family Medicine

## 2012-12-30 MED ORDER — FUROSEMIDE 40 MG PO TABS: ORAL_TABLET | ORAL | Status: DC

## 2012-12-30 MED ORDER — HYDROCODONE-ACETAMINOPHEN 10-325 MG PO TABS: 1.0000 | ORAL_TABLET | Freq: Four times a day (QID) | ORAL | Status: DC | PRN

## 2012-12-30 NOTE — Telephone Encounter (Signed)
Refilled the two that she asked for on the voicemail but tried to call her back to see what the third one was but no answer

## 2012-12-31 MED ORDER — METHYLPREDNISOLONE ACETATE 80 MG/ML IJ SUSP
80.0000 mg | Freq: Once | INTRAMUSCULAR | Status: AC
  Administered 2012-12-29: 80 mg via INTRAMUSCULAR

## 2013-01-01 NOTE — Assessment & Plan Note (Signed)
Controlled, no change in medication Hyperlipidemia:Low fat diet discussed and encouraged.  \ 

## 2013-01-01 NOTE — Assessment & Plan Note (Signed)
Deteriotaed. Patient advised to reduce carb and sweets, commit to regular physical activity, take meds as prescribed, test blood as directed, and attempt to lose weight, to improve blood sugar control.

## 2013-01-01 NOTE — Assessment & Plan Note (Signed)
Controlled, no change in medication  

## 2013-01-01 NOTE — Assessment & Plan Note (Signed)
Acute flare, depo medrol in office followed by steroid burst. Antibiotics per pulmonary

## 2013-01-01 NOTE — Assessment & Plan Note (Signed)
Controlled, no change in medication DASH diet and commitment to daily physical activity for a minimum of 30 minutes discussed and encouraged, as a part of hypertension management. The importance of attaining a healthy weight is also discussed.  

## 2013-01-01 NOTE — Assessment & Plan Note (Signed)
Unchanged. Patient counseled for approximately 5 minutes regarding the health risks of ongoing nicotine use, specifically all types of cancer, heart disease, stroke and respiratory failure. The options available for help with cessation ,the behavioral changes to assist the process, and the option to either gradully reduce usage  Or abruptly stop.is also discussed. Pt is also encouraged to set specific goals in number of cigarettes used daily, as well as to set a quit date.  

## 2013-01-02 ENCOUNTER — Telehealth: Payer: Self-pay | Admitting: Family Medicine

## 2013-01-02 ENCOUNTER — Other Ambulatory Visit: Payer: Self-pay | Admitting: Family Medicine

## 2013-01-02 DIAGNOSIS — J449 Chronic obstructive pulmonary disease, unspecified: Secondary | ICD-10-CM

## 2013-01-02 DIAGNOSIS — R2681 Unsteadiness on feet: Secondary | ICD-10-CM

## 2013-01-02 DIAGNOSIS — R4701 Aphasia: Secondary | ICD-10-CM

## 2013-01-02 NOTE — Telephone Encounter (Signed)
Spoke with pt about concerns of her therapist with regard to he r health. She has agreed to a brain scan to eval expressive aphasia, and unsteady gait , also to rep PFT to assess severity of her COPD, she fears that this is worrsening significantly states she smokes approx half pack per da, she has agreed to cut back by one cigg per week Appt will be scheduled for pulmonary f/u to discuss her COPD. She report need for increased need for oxygen , higher flow with activity  PLS sched all appointments for [name] on an day but a Wednesday, she gets her oxygen delivered to her on that day Referred fo brain scan, PFt and eval by dr Juanetta Gosling after the pft

## 2013-01-05 ENCOUNTER — Other Ambulatory Visit: Payer: Self-pay

## 2013-01-05 ENCOUNTER — Other Ambulatory Visit: Payer: Self-pay | Admitting: Family Medicine

## 2013-01-05 DIAGNOSIS — E860 Dehydration: Secondary | ICD-10-CM | POA: Diagnosis not present

## 2013-01-06 ENCOUNTER — Encounter (HOSPITAL_COMMUNITY): Payer: Self-pay

## 2013-01-06 ENCOUNTER — Ambulatory Visit (HOSPITAL_COMMUNITY)
Admission: RE | Admit: 2013-01-06 | Discharge: 2013-01-06 | Disposition: A | Discharge status: Home / Self Care | Payer: Medicare Other | Source: Ambulatory Visit | Attending: Family Medicine | Admitting: Family Medicine

## 2013-01-06 DIAGNOSIS — R4701 Aphasia: Secondary | ICD-10-CM | POA: Diagnosis not present

## 2013-01-06 DIAGNOSIS — M6281 Muscle weakness (generalized): Secondary | ICD-10-CM | POA: Diagnosis not present

## 2013-01-06 DIAGNOSIS — R413 Other amnesia: Secondary | ICD-10-CM | POA: Insufficient documentation

## 2013-01-06 DIAGNOSIS — R209 Unspecified disturbances of skin sensation: Secondary | ICD-10-CM | POA: Diagnosis not present

## 2013-01-06 DIAGNOSIS — R269 Unspecified abnormalities of gait and mobility: Secondary | ICD-10-CM | POA: Insufficient documentation

## 2013-01-06 DIAGNOSIS — R2681 Unsteadiness on feet: Secondary | ICD-10-CM

## 2013-01-06 LAB — BASIC METABOLIC PANEL
Glucose, Bld: 90 mg/dL (ref 70–99)
Potassium: 4.1 mEq/L (ref 3.5–5.3)
Sodium: 141 mEq/L (ref 135–145)

## 2013-01-12 ENCOUNTER — Ambulatory Visit (HOSPITAL_COMMUNITY): Admission: RE | Admit: 2013-01-12 | Payer: Medicare Other | Source: Ambulatory Visit

## 2013-01-25 ENCOUNTER — Ambulatory Visit (HOSPITAL_COMMUNITY)
Admission: RE | Admit: 2013-01-25 | Discharge: 2013-01-25 | Disposition: A | Discharge status: Home / Self Care | Payer: Medicare Other | Source: Ambulatory Visit | Attending: Family Medicine | Admitting: Family Medicine

## 2013-01-25 DIAGNOSIS — J449 Chronic obstructive pulmonary disease, unspecified: Secondary | ICD-10-CM

## 2013-01-25 DIAGNOSIS — J4489 Other specified chronic obstructive pulmonary disease: Secondary | ICD-10-CM | POA: Insufficient documentation

## 2013-01-25 MED ORDER — ALBUTEROL SULFATE (5 MG/ML) 0.5% IN NEBU
2.5000 mg | INHALATION_SOLUTION | Freq: Once | RESPIRATORY_TRACT | Status: AC
  Administered 2013-01-25: 2.5 mg via RESPIRATORY_TRACT

## 2013-01-26 NOTE — Procedures (Signed)
[name], [name]               ACCOUNT NO.:  [id number]  MEDICAL RECORD NO.:  [id number]  LOCATION:  RESP                          FACILITY:  [facility]  PHYSICIAN:  Con Arganbright L. Juanetta Gosling, M.D.DATE OF BIRTH:  [birth year]  DATE OF PROCEDURE:  01/25/2013 DATE OF DISCHARGE:  01/25/2013                           PULMONARY FUNCTION TEST   REASON FOR PULMONARY FUNCTION TESTING:  Chronic obstructive pulmonary disease.  1. Spirometry shows a moderate-to-severe ventilatory defect without     definite airflow obstruction. 2. Lung volumes show air trapping. 3. DLCO is severely reduced. 4. Airway resistance is elevated, suggesting airflow obstruction. 5. There is no significant bronchodilator improvement.     [name] L. Juanetta Gosling, M.D.     ELH/MEDQ  D:  01/25/2013  T:  01/26/2013  Job:  [id number]

## 2013-01-27 LAB — PULMONARY FUNCTION TEST

## 2013-02-01 ENCOUNTER — Other Ambulatory Visit: Payer: Self-pay | Admitting: Family Medicine

## 2013-02-01 ENCOUNTER — Ambulatory Visit (HOSPITAL_COMMUNITY): Payer: Medicare Other | Admitting: Psychology

## 2013-02-10 DIAGNOSIS — E669 Obesity, unspecified: Secondary | ICD-10-CM | POA: Diagnosis not present

## 2013-02-10 DIAGNOSIS — E109 Type 1 diabetes mellitus without complications: Secondary | ICD-10-CM | POA: Diagnosis not present

## 2013-02-10 DIAGNOSIS — J449 Chronic obstructive pulmonary disease, unspecified: Secondary | ICD-10-CM | POA: Diagnosis not present

## 2013-02-10 DIAGNOSIS — I10 Essential (primary) hypertension: Secondary | ICD-10-CM | POA: Diagnosis not present

## 2013-02-14 ENCOUNTER — Ambulatory Visit (INDEPENDENT_AMBULATORY_CARE_PROVIDER_SITE_OTHER): Payer: Medicare Other | Admitting: Psychology

## 2013-02-14 DIAGNOSIS — F332 Major depressive disorder, recurrent severe without psychotic features: Secondary | ICD-10-CM

## 2013-02-14 DIAGNOSIS — F4001 Agoraphobia with panic disorder: Secondary | ICD-10-CM

## 2013-03-02 ENCOUNTER — Other Ambulatory Visit: Payer: Self-pay

## 2013-03-02 ENCOUNTER — Other Ambulatory Visit: Payer: Self-pay | Admitting: Family Medicine

## 2013-03-02 MED ORDER — MONTELUKAST SODIUM 10 MG PO TABS: 10.0000 mg | ORAL_TABLET | Freq: Every day | ORAL | Status: DC

## 2013-03-15 ENCOUNTER — Ambulatory Visit (INDEPENDENT_AMBULATORY_CARE_PROVIDER_SITE_OTHER): Payer: Medicare Other | Admitting: Psychology

## 2013-03-15 DIAGNOSIS — F332 Major depressive disorder, recurrent severe without psychotic features: Secondary | ICD-10-CM | POA: Diagnosis not present

## 2013-03-15 DIAGNOSIS — F4001 Agoraphobia with panic disorder: Secondary | ICD-10-CM | POA: Diagnosis not present

## 2013-03-16 ENCOUNTER — Telehealth: Payer: Self-pay

## 2013-03-16 ENCOUNTER — Telehealth (HOSPITAL_COMMUNITY): Payer: Self-pay | Admitting: Psychology

## 2013-03-16 ENCOUNTER — Ambulatory Visit (HOSPITAL_COMMUNITY): Payer: Self-pay | Admitting: Psychology

## 2013-03-16 NOTE — Telephone Encounter (Signed)
Is not satisfied with Dr Juanetta Gosling and he has not explained anything to her about her PFT and is telling her to have Dr Lodema Hong explain it and she wants to be referred to Dr Fannie Knee in [place]

## 2013-03-17 ENCOUNTER — Other Ambulatory Visit: Payer: Self-pay | Admitting: Family Medicine

## 2013-03-17 DIAGNOSIS — J449 Chronic obstructive pulmonary disease, unspecified: Secondary | ICD-10-CM

## 2013-03-17 NOTE — Telephone Encounter (Signed)
Pl srefer her to Dr Maple Hudson and let her know

## 2013-03-18 ENCOUNTER — Telehealth: Payer: Self-pay | Admitting: Family Medicine

## 2013-03-18 NOTE — Telephone Encounter (Signed)
Luann to refer

## 2013-03-21 NOTE — Telephone Encounter (Signed)
I have not called this patient

## 2013-03-25 IMAGING — US US CAROTID DUPLEX BILAT
1 series · 13 of 24 positions shown · non-contrast
Comparison: None.

CLINICAL DATA: Carotid bruit, hypertension

BILATERAL CAROTID DUPLEX ULTRASOUND
TECHNIQUE: Gray scale imaging, color Doppler and duplex ultrasound
was performed of bilateral carotid and vertebral arteries in the
neck.

[Series 1: us carotid duplex bilat · 0.07mm/px · 13 of 68 slices shown]
[im 1/68]
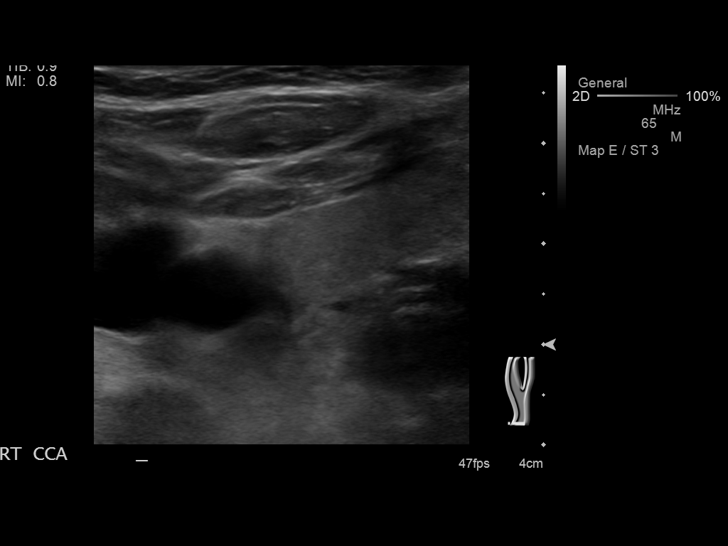
[im 6/68]
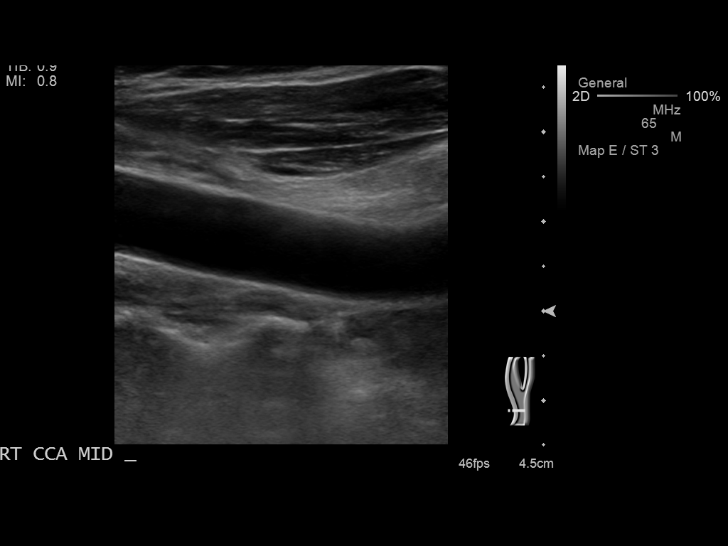
[im 12/68]
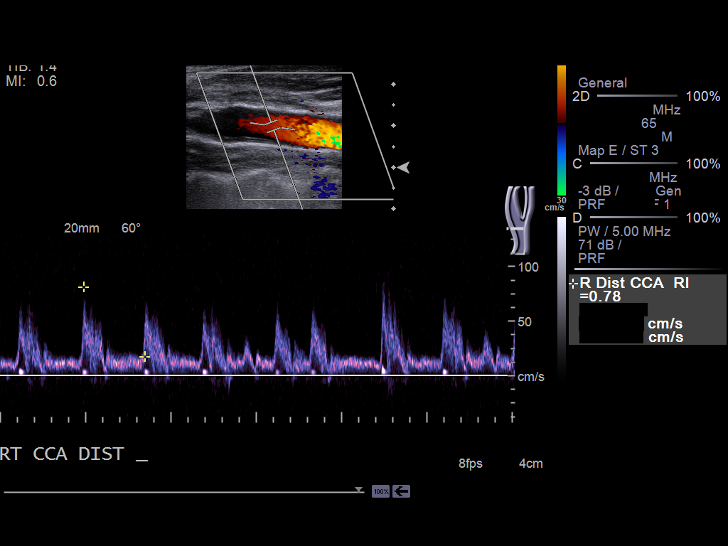
[im 18/68]
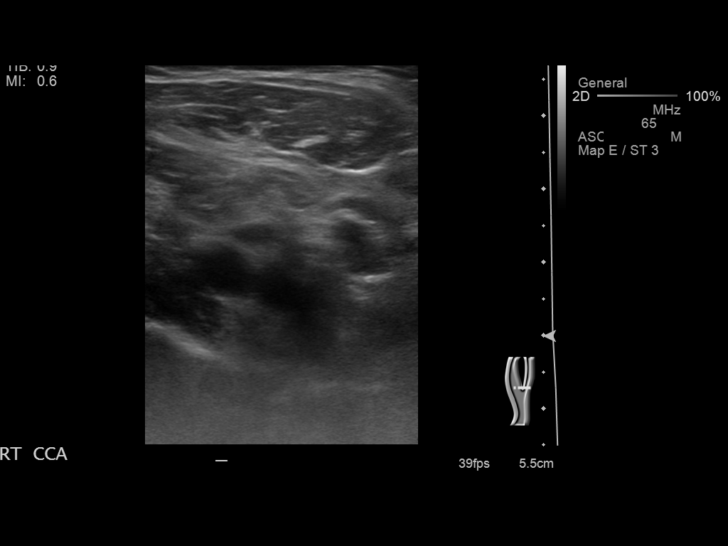
[im 24/68]
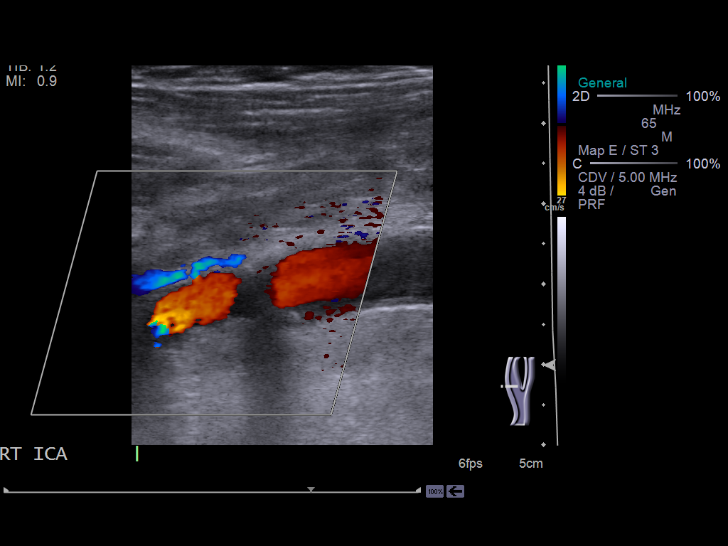
[im 30/68]
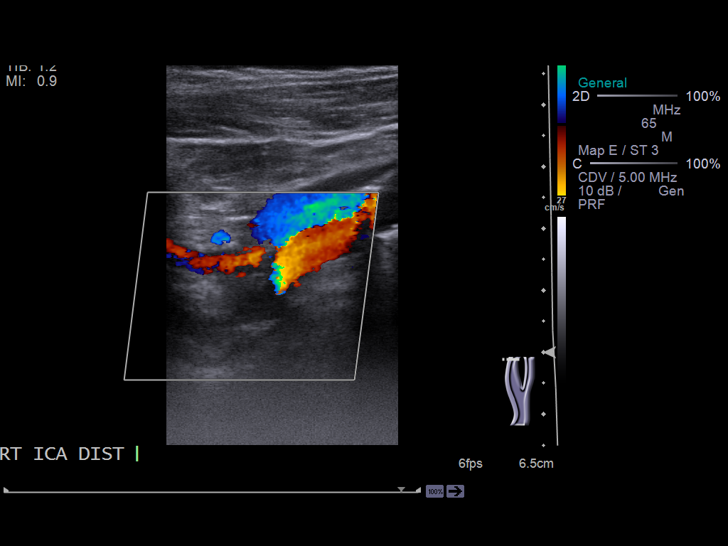
[im 35/68]
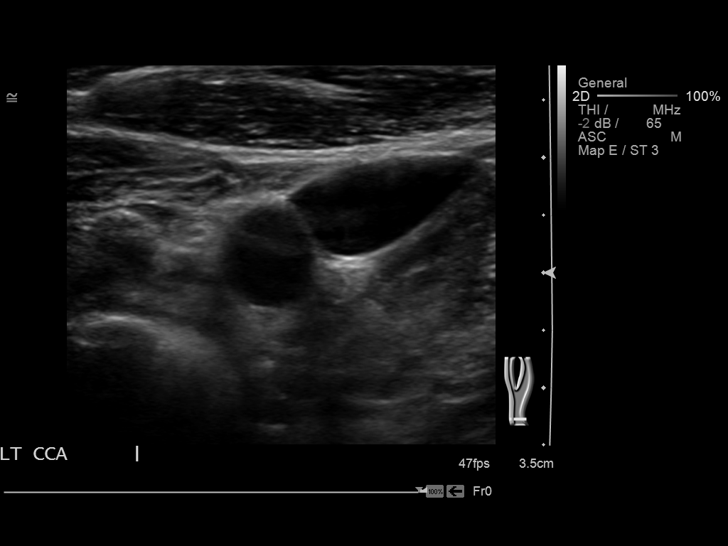
[im 38/68]
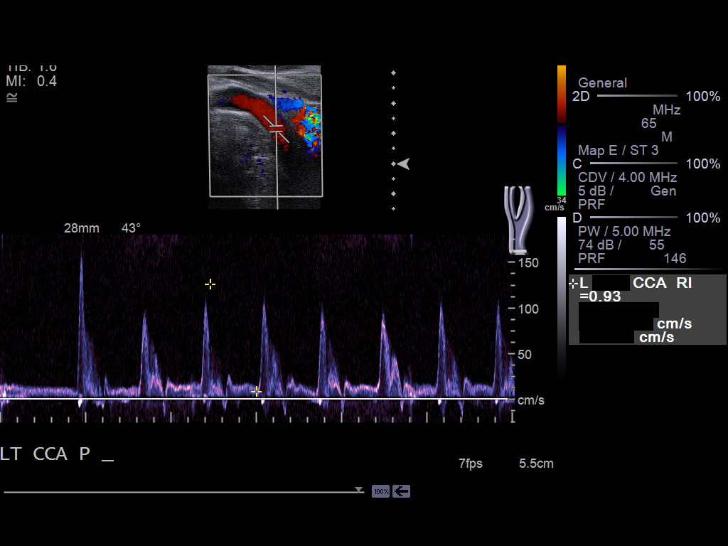
[im 44/68]
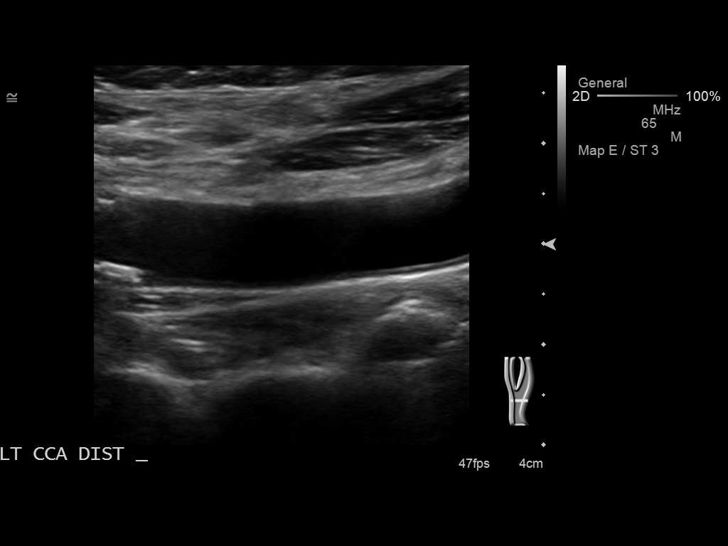
[im 50/68]
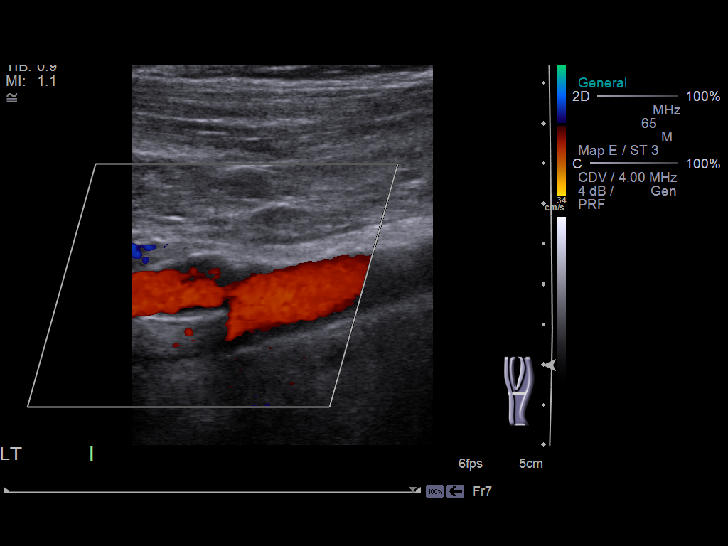
[im 56/68]
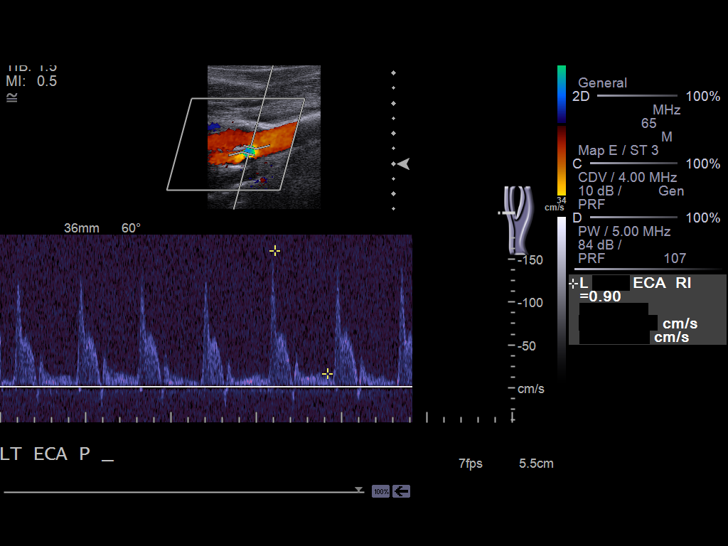
[im 62/68]
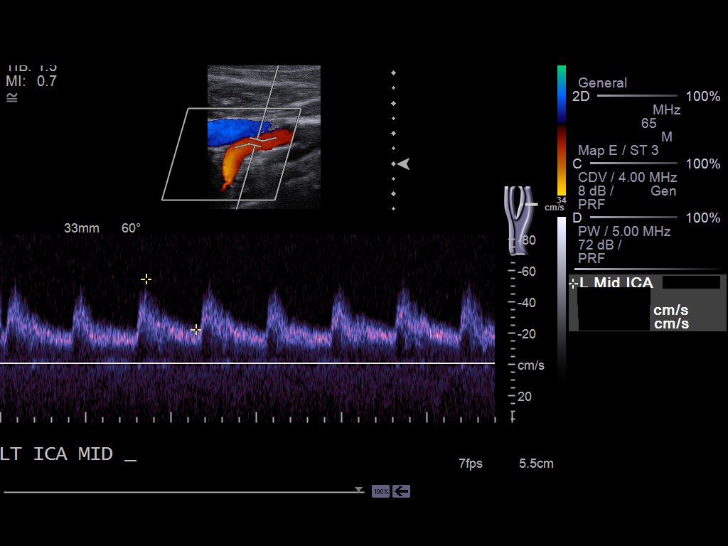
[im 68/68]
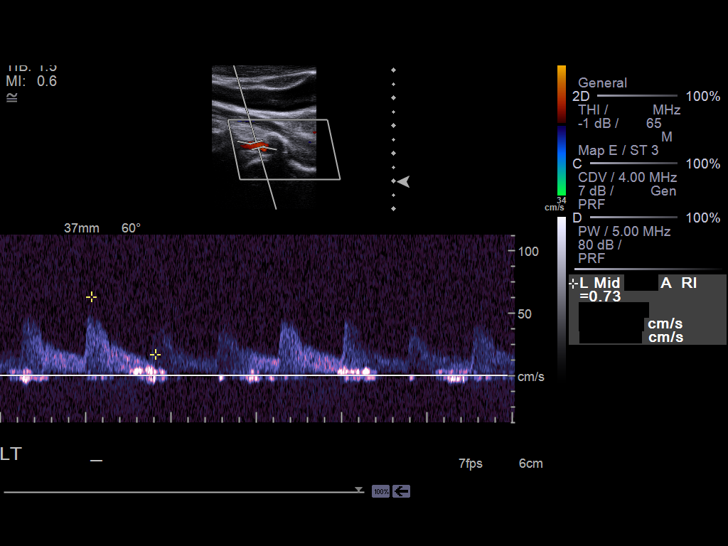

[13 of 24 positions shown; findings below may reference images not displayed]

Criteria:  Quantification of carotid stenosis is based on velocity
parameters that correlate the residual internal carotid diameter
with NASCET-based stenosis levels, using the diameter of the distal
internal carotid lumen as the denominator for stenosis measurement.

The following velocity measurements were obtained:

                 PEAK SYSTOLIC/END DIASTOLIC
RIGHT
ICA:                        68/24cm/sec
CCA:                        89/18cm/sec
SYSTOLIC ICA/CCA RATIO:
DIASTOLIC ICA/CCA RATIO:
ECA:                        91cm/sec

LEFT
ICA:                        60/25cm/sec
CCA:                        88/11cm/sec
SYSTOLIC ICA/CCA RATIO:
DIASTOLIC ICA/CCA RATIO:
ECA:                        160cm/sec
FINDINGS: RIGHT CAROTID ARTERY: Mild plaque formation.  No hemodynamically
significant right ICA stenosis, velocity elevation, or turbulent
flow.

RIGHT VERTEBRAL ARTERY:  Antegrade

LEFT CAROTID ARTERY: Mild plaque formation.  No hemodynamically
significant left ICA stenosis, velocity elevation, or turbulent
flow.

LEFT VERTEBRAL ARTERY:  Antegrade
IMPRESSION: Mild bilateral carotid atherosclerosis but no hemodynamically
significant ICA stenosis by ultrasound

## 2013-03-28 ENCOUNTER — Other Ambulatory Visit: Payer: Self-pay | Admitting: Family Medicine

## 2013-03-29 ENCOUNTER — Telehealth: Payer: Self-pay | Admitting: Family Medicine

## 2013-03-29 ENCOUNTER — Other Ambulatory Visit: Payer: Self-pay

## 2013-03-29 MED ORDER — HYDROCODONE-ACETAMINOPHEN 10-325 MG PO TABS: 1.0000 | ORAL_TABLET | Freq: Four times a day (QID) | ORAL | Status: DC | PRN

## 2013-03-29 MED ORDER — MELOXICAM 15 MG PO TABS: ORAL_TABLET | ORAL | Status: DC

## 2013-03-29 MED ORDER — TIZANIDINE HCL 4 MG PO TABS: ORAL_TABLET | ORAL | Status: DC

## 2013-03-29 NOTE — Telephone Encounter (Signed)
Refills requested sent in

## 2013-03-29 NOTE — Telephone Encounter (Signed)
Wants to pick up hard copy of rx today she will or her sister [place]

## 2013-03-29 NOTE — Telephone Encounter (Signed)
Noted and rx printed for signature.

## 2013-04-05 DIAGNOSIS — E785 Hyperlipidemia, unspecified: Secondary | ICD-10-CM | POA: Diagnosis not present

## 2013-04-05 DIAGNOSIS — IMO0002 Reserved for concepts with insufficient information to code with codable children: Secondary | ICD-10-CM | POA: Diagnosis not present

## 2013-04-05 DIAGNOSIS — E1065 Type 1 diabetes mellitus with hyperglycemia: Secondary | ICD-10-CM | POA: Diagnosis not present

## 2013-04-05 LAB — HEMOGLOBIN A1C: Hgb A1c MFr Bld: 7.7 % — ABNORMAL HIGH (ref <5.7)

## 2013-04-06 ENCOUNTER — Other Ambulatory Visit: Payer: Self-pay | Admitting: Family Medicine

## 2013-04-06 DIAGNOSIS — Z139 Encounter for screening, unspecified: Secondary | ICD-10-CM

## 2013-04-06 LAB — COMPLETE METABOLIC PANEL WITH GFR
Alkaline Phosphatase: 74 U/L (ref 39–117)
BUN: 23 mg/dL (ref 6–23)
GFR, Est Non African American: 54 mL/min — ABNORMAL LOW
Glucose, Bld: 116 mg/dL — ABNORMAL HIGH (ref 70–99)
Sodium: 137 mEq/L (ref 135–145)
Total Bilirubin: 0.3 mg/dL (ref 0.3–1.2)

## 2013-04-06 LAB — LIPID PANEL
Cholesterol: 126 mg/dL (ref 0–200)
HDL: 38 mg/dL — ABNORMAL LOW (ref >39)
Total CHOL/HDL Ratio: 3.3 Ratio
VLDL: 18 mg/dL (ref 0–40)

## 2013-04-07 ENCOUNTER — Encounter (INDEPENDENT_AMBULATORY_CARE_PROVIDER_SITE_OTHER): Payer: Self-pay

## 2013-04-07 ENCOUNTER — Ambulatory Visit (INDEPENDENT_AMBULATORY_CARE_PROVIDER_SITE_OTHER): Payer: Medicare Other | Admitting: Family Medicine

## 2013-04-07 ENCOUNTER — Encounter: Payer: Self-pay | Admitting: Family Medicine

## 2013-04-07 VITALS — BP 118/82 | HR 82 | Resp 18 | Ht 64.0 in | Wt 249.1 lb

## 2013-04-07 DIAGNOSIS — H9190 Unspecified hearing loss, unspecified ear: Secondary | ICD-10-CM | POA: Insufficient documentation

## 2013-04-07 DIAGNOSIS — E1065 Type 1 diabetes mellitus with hyperglycemia: Secondary | ICD-10-CM

## 2013-04-07 DIAGNOSIS — Z9981 Dependence on supplemental oxygen: Secondary | ICD-10-CM

## 2013-04-07 DIAGNOSIS — Z23 Encounter for immunization: Secondary | ICD-10-CM

## 2013-04-07 DIAGNOSIS — E669 Obesity, unspecified: Secondary | ICD-10-CM

## 2013-04-07 DIAGNOSIS — E039 Hypothyroidism, unspecified: Secondary | ICD-10-CM

## 2013-04-07 DIAGNOSIS — E785 Hyperlipidemia, unspecified: Secondary | ICD-10-CM

## 2013-04-07 DIAGNOSIS — Z0000 Encounter for general adult medical examination without abnormal findings: Secondary | ICD-10-CM

## 2013-04-07 DIAGNOSIS — I10 Essential (primary) hypertension: Secondary | ICD-10-CM

## 2013-04-07 DIAGNOSIS — M899 Disorder of bone, unspecified: Secondary | ICD-10-CM

## 2013-04-07 MED ORDER — INSULIN GLARGINE 100 UNIT/ML ~~LOC~~ SOLN: SUBCUTANEOUS | Status: DC

## 2013-04-07 NOTE — Patient Instructions (Signed)
F/U in 3.5 month, call if you need me before  Flu vaccine today Dose increase in lantus to 50 units daily.  Stop at checkout for appts with Dr Maple Hudson, Dione Booze and Teoh   Pls plant to cut back on cigarettes, you need to quit  Non fasting cmp and eGFr, hBa1C, TSH, vit D in 3.5 month, before visit

## 2013-04-07 NOTE — Progress Notes (Signed)
Subjective:    Patient ID: [name], female    DOB: 1950-06-29, 62 y.o.   MRN: [id number]  HPI Preventive Screening-Counseling & Management   Patient present here today for a Medicare annual wellness visit.   Current Problems (verified)   Medications Prior to Visit Allergies (verified)   PAST HISTORY  Family History: 2 sibs living, 2 sibs deceased. Positive depression, in family. 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    Risk Factors  Current exercise habits:  Very little physical activity, encouraged to increase  Dietary issues discussed:low fat, low carb and sweets   Cardiac risk factors: multiple, nicotine,IDDM, uncontrolled, hyperlipidemia  Depression Screen  (Note: if answer to either of the following is "Yes", a more complete depression screening is indicated)  Pt in treatment for depression, also feel "good" now that she is involved in caring for a very ill niece , who is improving Over the past two weeks, have you felt down, depressed or hopeless? No  Over the past two weeks, have you felt little interest or pleasure in doing things? No  Have you lost interest or pleasure in daily life? No  Do you often feel hopeless? No  Do you cry easily over simple problems? No   Activities of Daily Living  In your present state of health, do you have any difficulty performing the following activities?  Driving?: poor night vision Managing money?: No Feeding yourself?:No Getting from bed to chair?:No Climbing a flight of stairs?:yes Preparing food and eating?:No Bathing or showering?:No Getting dressed?:No Getting to the toilet?:No Using the toilet?:No Moving around from place to place?: yes  Fall Risk Assessment In the past year have you fallen or had a near fall?:No Are you currently taking any medications that make you dizzy?:No   Hearing Difficulties: No Do you often ask people to speak up or  repeat themselves?yes Do you experience ringing or noises in your ears?:Not sure Do you have difficulty understanding soft or whispered voices?:yes  Cognitive Testing  Alert? Yes Normal Appearance?Yes  Oriented to person? Yes Place? Yes  Time? Yes  Displays appropriate judgment?Yes  Can read the correct time from a watch face? yes Are you having problems remembering things?No  Advanced Directives have been discussed with the patient?Yes , full code, does not want indefinite life support or also if poor quality of life expected   List the Names of Other Physician/Practitioners you currently use: se list.   Indicate any recent Medical Services you may have received from other than [facility] providers in the past year (date may be approximate).   Assessment:    Annual Wellness Exam   Plan:    During the course of the visit the patient was educated and counseled about appropriate screening and preventive services including:  A healthy diet is rich in fruit, vegetables and whole grains. Poultry fish, nuts and beans are a healthy choice for protein rather then red meat. A low sodium diet and drinking 64 ounces of water daily is generally recommended. Oils and sweet should be limited. Carbohydrates especially for those who are diabetic or overweight, should be limited to 30-45 gram per meal. It is important to eat on a regular schedule, at least 3 times daily. Snacks should be primarily fruits, vegetables or nuts. It is important that you exercise regularly at least 30 minutes 5 times a week. If you develop chest pain, have severe difficulty breathing, or feel very  tired, stop exercising immediately and seek medical attention  Immunization reviewed and updated. Cancer screening reviewed and updated    Patient Instructions (the written plan) was given to the patient.  Medicare Attestation  I have personally reviewed:  The patient's medical and social history  Their use of alcohol, tobacco or  illicit drugs  Their current medications and supplements  The patient's functional ability including ADLs,fall risks, home safety risks, cognitive, and hearing and visual impairment  Diet and physical activities  Evidence for depression or mood disorders  The patient's weight, height, BMI, and visual acuity have been recorded in the chart. I have made referrals, counseling, and provided education to the patient based on review of the above and I have provided the patient with a written personalized care plan for preventive services.      Review of Systems     Objective:   Physical Exam        Assessment & Plan:

## 2013-04-08 ENCOUNTER — Other Ambulatory Visit: Payer: Self-pay

## 2013-04-08 DIAGNOSIS — Z0000 Encounter for general adult medical examination without abnormal findings: Secondary | ICD-10-CM | POA: Insufficient documentation

## 2013-04-08 MED ORDER — GLIPIZIDE ER 2.5 MG PO TB24: ORAL_TABLET | ORAL | Status: DC

## 2013-04-08 NOTE — Assessment & Plan Note (Signed)
Annual wellness as documented. Pt needs to work on smoking cessation and weight loss to improve overall quality of health. Also encouraged to increase physical activity She continues to benefit from reguarly therapy through mental health, feels "good " now, as helping to care for a  niece who was felt to be terminally ill, but is recovering Several referrals are placed that are requested or needed during the visit also

## 2013-04-11 ENCOUNTER — Telehealth: Payer: Self-pay | Admitting: Family Medicine

## 2013-04-14 NOTE — Telephone Encounter (Signed)
Patient is aware 

## 2013-04-15 ENCOUNTER — Ambulatory Visit (HOSPITAL_COMMUNITY): Payer: Self-pay | Admitting: Psychology

## 2013-04-18 ENCOUNTER — Institutional Professional Consult (permissible substitution): Payer: Self-pay | Admitting: Internal Medicine

## 2013-04-19 ENCOUNTER — Ambulatory Visit (HOSPITAL_COMMUNITY): Payer: Self-pay

## 2013-04-21 ENCOUNTER — Ambulatory Visit (INDEPENDENT_AMBULATORY_CARE_PROVIDER_SITE_OTHER): Payer: Self-pay | Admitting: Otolaryngology

## 2013-04-26 ENCOUNTER — Other Ambulatory Visit: Payer: Self-pay | Admitting: Family Medicine

## 2013-04-26 ENCOUNTER — Telehealth: Payer: Self-pay | Admitting: Family Medicine

## 2013-04-26 ENCOUNTER — Encounter (HOSPITAL_COMMUNITY): Payer: Self-pay | Admitting: Psychology

## 2013-04-26 ENCOUNTER — Other Ambulatory Visit: Payer: Self-pay

## 2013-04-26 MED ORDER — HYDROCODONE-ACETAMINOPHEN 10-325 MG PO TABS: 1.0000 | ORAL_TABLET | Freq: Four times a day (QID) | ORAL | Status: DC | PRN

## 2013-04-26 NOTE — Progress Notes (Signed)
Patient:  [name]   DOB: 09-13-1950  MR Number: [id number]  Location: BEHAVIORAL [facility] [address] [place] [address] Dept: [phone]  Start: 4 PM End: 5 PM   Provider/Observer:     Hershal Coria PSYD  Chief Complaint:      Chief Complaint  Patient presents with  . Anxiety  . Depression  . Stress    Reason For Service:     The patient was referred for psychotherapeutic interventions because of ongoing symptoms of depression, anxiety, and mood swings including anger outbursts particularly of a verbal nature.  Interventions Strategy:  Cognitive/behavioral psychotherapeutic interventions  Participation Level:   Active  Participation Quality:  Appropriate      Behavioral Observation:  Well Groomed, Alert, and Appropriate.   Current Psychosocial Factors: The patient reports that she is doing better as far as getting out and being more active reports that her depression is been little better.  Content of Session:   Review current symptoms and continued work on therapeutic interventions for issues of recurrent depression and panic attacks.  Current Status:   The patient reports that her breathing has been doing better and she actually went and had a breathing test which was equal to or above what it previously measured. This compounds her pulmonologist at this point as to the progression of her condition  Patient Progress:   Very good considering the situation.  Target Goals:   Target goals have to do with reducing the intensity, duration, and frequency of depressive events as well as reducing the intensity, duration, and frequency of panic attacks. Target goals include building coping skills and strategies around both of these diagnostic issues.  Last Reviewed:   03/15/2013  Goals Addressed Today:    Specific goals addressed today had to do with managing and coping with her  cognitive and behavioral responses to stressors particularly around issues that may lead to full-blown panic attacks when she is in medical situations. These have been particular triggers for her in the past for panic attacks due to her medical illnesses and past medical treatments.  Impression/Diagnosis:   The patient has had a lot of significant medical issues to deal with particularly regarding pulmonary function. However, she also has a long history of depression and panic attacks have become exacerbated due to to persistent and serious medical issues.  Diagnosis:    Axis I:  Major depressive disorder, recurrent severe without psychotic features  Panic disorder with agoraphobia and moderate panic attacks      Axis II: No diagnosis

## 2013-04-26 NOTE — Progress Notes (Signed)
Patient:  [name]   DOB: July 21, 1950  MR Number: [id number]  Location: BEHAVIORAL [facility] [address] [place] [address] Dept: [phone]  Start: 2 PM End: 3 PM  Provider/Observer:     Hershal Coria PSYD  Chief Complaint:      Chief Complaint  Patient presents with  . Depression    Reason For Service:     The patient was referred for psychotherapeutic interventions because of ongoing symptoms of depression, anxiety, and mood swings including anger outbursts particularly of a verbal nature.  Interventions Strategy:  Cognitive/behavioral psychotherapeutic interventions  Participation Level:   Active  Participation Quality:  Appropriate      Behavioral Observation:  Well Groomed, Alert, and Appropriate.   Current Psychosocial Factors: The patient reports that she is doing better as far as getting out and being more active reports that her depression is been little better.  Content of Session:   Review current symptoms and continued work on therapeutic interventions for issues of recurrent depression and panic attacks.  Current Status:   The patient reports that her breathing has been doing better and she actually went and had a breathing test which was equal to or above what it previously measured. This compounds her pulmonologist at this point as to the progression of her condition  Patient Progress:   Very good considering the situation.  Target Goals:   Target goals have to do with reducing the intensity, duration, and frequency of depressive events as well as reducing the intensity, duration, and frequency of panic attacks. Target goals include building coping skills and strategies around both of these diagnostic issues.  Last Reviewed:   02/14/2013  Goals Addressed Today:    Specific goals addressed today had to do with managing and coping with her cognitive and behavioral  responses to stressors particularly around issues that may lead to full-blown panic attacks when she is in medical situations. These have been particular triggers for her in the past for panic attacks due to her medical illnesses and past medical treatments.  Impression/Diagnosis:   The patient has had a lot of significant medical issues to deal with particularly regarding pulmonary function. However, she also has a long history of depression and panic attacks have become exacerbated due to to persistent and serious medical issues.  Diagnosis:    Axis I:  Major depressive disorder, recurrent severe without psychotic features  Panic disorder with agoraphobia and moderate panic attacks      Axis II: No diagnosis

## 2013-04-26 NOTE — Telephone Encounter (Signed)
Noted.  Med refilled.  Contract will be given to sister to deliver

## 2013-05-02 ENCOUNTER — Ambulatory Visit (INDEPENDENT_AMBULATORY_CARE_PROVIDER_SITE_OTHER): Payer: Medicare Other | Admitting: Psychology

## 2013-05-02 ENCOUNTER — Encounter (HOSPITAL_COMMUNITY): Payer: Self-pay | Admitting: Psychology

## 2013-05-02 DIAGNOSIS — F4001 Agoraphobia with panic disorder: Secondary | ICD-10-CM

## 2013-05-02 DIAGNOSIS — F332 Major depressive disorder, recurrent severe without psychotic features: Secondary | ICD-10-CM | POA: Diagnosis not present

## 2013-05-02 NOTE — Progress Notes (Signed)
Patient:  [name]   DOB: 1950/12/10  MR Number: [id number]  Location: BEHAVIORAL [facility] [address] [place] [address] Dept: [phone]  Start: 3 PM End: 4 PM   Provider/Observer:     Hershal Coria PSYD  Chief Complaint:      Chief Complaint  Patient presents with  . Depression    Reason For Service:     The patient was referred for psychotherapeutic interventions because of ongoing symptoms of depression, anxiety, and mood swings including anger outbursts particularly of a verbal nature.  Interventions Strategy:  Cognitive/behavioral psychotherapeutic interventions  Participation Level:   Active  Participation Quality:  Appropriate      Behavioral Observation:  Well Groomed, Alert, and Appropriate.   Current Psychosocial Factors: Niece became very ill with kedney failure and rather than her stay in longterm stay stayed with Pt.  Niece made great recovery with Patient helping.  A lot of work for patient.  Content of Session:   Review current symptoms and continued work on therapeutic interventions for issues of recurrent depression and panic attacks.  Current Status:   Patient reports that depression has been better but tired and fatigue.  Patient Progress:   Very good considering the situation.  Target Goals:   Target goals have to do with reducing the intensity, duration, and frequency of depressive events as well as reducing the intensity, duration, and frequency of panic attacks. Target goals include building coping skills and strategies around both of these diagnostic issues.  Last Reviewed:   05/02/2013  Goals Addressed Today:    Specific goals addressed today had to do with managing and coping with her cognitive and behavioral responses to stressors particularly around issues that may lead to full-blown panic attacks when she is in medical situations. These have been  particular triggers for her in the past for panic attacks due to her medical illnesses and past medical treatments.  Impression/Diagnosis:   The patient has had a lot of significant medical issues to deal with particularly regarding pulmonary function. However, she also has a long history of depression and panic attacks have become exacerbated due to to persistent and serious medical issues.  Diagnosis:    Axis I:  Major depressive disorder, recurrent severe without psychotic features  Panic disorder with agoraphobia and moderate panic attacks      Axis II: No diagnosis

## 2013-05-06 ENCOUNTER — Inpatient Hospital Stay (HOSPITAL_COMMUNITY): Admission: RE | Admit: 2013-05-06 | Payer: Self-pay | Source: Ambulatory Visit

## 2013-05-10 ENCOUNTER — Institutional Professional Consult (permissible substitution): Payer: Self-pay | Admitting: Internal Medicine

## 2013-05-13 ENCOUNTER — Other Ambulatory Visit: Payer: Self-pay

## 2013-05-13 MED ORDER — HYDROCODONE-ACETAMINOPHEN 10-325 MG PO TABS: 1.0000 | ORAL_TABLET | Freq: Four times a day (QID) | ORAL | Status: DC | PRN

## 2013-06-07 ENCOUNTER — Ambulatory Visit (INDEPENDENT_AMBULATORY_CARE_PROVIDER_SITE_OTHER): Payer: Medicare Other | Admitting: Psychology

## 2013-06-07 DIAGNOSIS — F4001 Agoraphobia with panic disorder: Secondary | ICD-10-CM | POA: Diagnosis not present

## 2013-06-07 DIAGNOSIS — F332 Major depressive disorder, recurrent severe without psychotic features: Secondary | ICD-10-CM | POA: Diagnosis not present

## 2013-06-09 ENCOUNTER — Encounter (HOSPITAL_COMMUNITY): Payer: Self-pay | Admitting: Psychology

## 2013-06-09 ENCOUNTER — Ambulatory Visit (INDEPENDENT_AMBULATORY_CARE_PROVIDER_SITE_OTHER): Payer: Self-pay | Admitting: Otolaryngology

## 2013-06-09 NOTE — Progress Notes (Deleted)
Psychiatric Assessment Adult  Patient Identification:  [name] Date of Evaluation:  06/09/2013 Chief Complaint: *** History of Chief Complaint:   Chief Complaint  Patient presents with  . Depression    HPI Review of Systems Physical Exam  Depressive Symptoms: {DEPRESSION SYMPTOMS:20000}  (Hypo) Manic Symptoms:   Elevated Mood:  {BHH YES OR NO:22294} Irritable Mood:  {BHH YES OR NO:22294} Grandiosity:  {BHH YES OR NO:22294} Distractibility:  {BHH YES OR NO:22294} Labiality of Mood:  {BHH YES OR NO:22294} Delusions:  {BHH YES OR NO:22294} Hallucinations:  {BHH YES OR NO:22294} Impulsivity:  {BHH YES OR NO:22294} Sexually Inappropriate Behavior:  {BHH YES OR NO:22294} Financial Extravagance:  {BHH YES OR NO:22294} Flight of Ideas:  {BHH YES OR NO:22294}  Anxiety Symptoms: Excessive Worry:  {BHH YES OR NO:22294} Panic Symptoms:  {BHH YES OR NO:22294} Agoraphobia:  {BHH YES OR NO:22294} Obsessive Compulsive: {BHH YES OR NO:22294}  Symptoms: {Obsessive Compulsive Symptoms:22671} Specific Phobias:  {BHH YES OR NO:22294} Social Anxiety:  {BHH YES OR NO:22294}  Psychotic Symptoms:  Hallucinations: {BHH YES OR NO:22294} {Hallucinations:22672} Delusions:  {BHH YES OR NO:22294} Paranoia:  {BHH YES OR NO:22294}   Ideas of Reference:  {BHH YES OR NO:22294}  PTSD Symptoms: Ever had a traumatic exposure:  {BHH YES OR NO:22294} Had a traumatic exposure in the last month:  {BHH YES OR NO:22294} Re-experiencing: {BHH YES OR NO:22294} {Re-experiencing:22673} Hypervigilance:  {BHH YES OR NO:22294} Hyperarousal: {BHH YES OR NO:22294} {Hyperarousal:22674} Avoidance: {BHH YES OR NO:22294} {Avoidance:22675}  Traumatic Brain Injury: {BHH YES OR NO:22294} {Traumatic Brain Injury:22676}  Past Psychiatric History: Diagnosis: ***  Hospitalizations: ***  Outpatient Care: ***  Substance Abuse Care: ***  Self-Mutilation: ***  Suicidal Attempts: ***  Violent Behaviors: ***   Past  Medical History:   Past Medical History  Diagnosis Date  . DJD (degenerative joint disease)     of the spine   . GERD (gastroesophageal reflux disease)   . Insomnia   . COPD (chronic obstructive pulmonary disease)   . Obesity   . Anxiety   . Hypothyroidism 2004    left thyroidectomy  . Diabetes mellitus, type 1 1983    type 2 diabetes  . Hypertension 1983  . Hyperlipidemia 2007  . Depression 2008   History of Loss of Consciousness:  {BHH YES OR NO:22294} Seizure History:  {BHH YES OR NO:22294} Cardiac History:  {BHH YES OR NO:22294} Allergies:   Allergies  Allergen Reactions  . Codeine     REACTION: nausea   Current Medications:  Current Outpatient Prescriptions  Medication Sig Dispense Refill  . ADVAIR DISKUS 250-50 MCG/DOSE AEPB inhale 1 dose by mouth twice a day as directed  60 each  4  . ALPRAZolam (XANAX) 1 MG tablet TAKE 1 TABLET BY MOUTH 4 TIMES A DAY  120 tablet  4  . aspirin (ASPIRIN LOW DOSE) 81 MG EC tablet Take 81 mg by mouth daily.        . B-D INS SYR ULTRAFINE 1CC/31G 31G X 5/16" 1 ML MISC use as directed FOR ONCE DAILY TESTING  100 each  5  . calcium carbonate (TUMS) 500 MG chewable tablet Chew 1 tablet by mouth as needed.        . cloNIDine (CATAPRES) 0.1 MG tablet take 1 tablet by mouth twice a day  30 tablet  4  . CRESTOR 20 MG tablet take 1 tablet by mouth once daily  30 tablet  4  . FLUoxetine (PROZAC) 40 MG capsule take  1 capsule by mouth once daily  30 capsule  4  . Foot Care Products (DIABETIC INSOLES) MISC by Does not apply route. And Shoes      . furosemide (LASIX) 40 MG tablet take 1 tablet once daily  30 tablet  4  . glipiZIDE (GLUCOTROL XL) 2.5 MG 24 hr tablet take 1 tablet by mouth once daily  30 tablet  5  . Glucose Blood (BAYER BREEZE 2 TEST) DISK by In Vitro route. Lancets and strips for four times a day testing       . guaiFENesin 100 MG/5ML SOLN Take 100 mg by mouth 4 (four) times daily. 15 ml by mouth 4 times a day       .  HYDROcodone-acetaminophen (NORCO) 10-325 MG per tablet Take 1 tablet by mouth every 6 (six) hours as needed.  120 tablet  0  . insulin glargine (LANTUS) 100 UNIT/ML injection 50 units once daily  30 mL  3  . ipratropium-albuterol (DUONEB) 0.5-2.5 (3) MG/3ML SOLN Take 3 mLs by nebulization 4 (four) times daily.        Marland Kitchen levalbuterol (XOPENEX HFA) 45 MCG/ACT inhaler Inhale 1-2 puffs into the lungs as needed.        Marland Kitchen levothyroxine (SYNTHROID, LEVOTHROID) 100 MCG tablet take 1 tablet by mouth once daily  30 tablet  4  . lisinopril-hydrochlorothiazide (PRINZIDE,ZESTORETIC) 20-12.5 MG per tablet take 1 tablet once daily  30 tablet  11  . meloxicam (MOBIC) 15 MG tablet take 1 tablet by mouth once daily  30 tablet  3  . montelukast (SINGULAIR) 10 MG tablet Take 1 tablet (10 mg total) by mouth at bedtime.  30 tablet  4  . NEXIUM 40 MG capsule TAKE 1 CAPSULE DAILY BEFORE BREAKFAST  30 capsule  4  . nystatin (MYCOSTATIN) powder Apply to affected area 3 times daily  60 g  3  . potassium chloride 20 MEQ/15ML (10%) solution TAKE 1 AND 1/2 TEASPOONFUL BY MOUTH ONCE DAILY  240 mL  2  . tiZANidine (ZANAFLEX) 4 MG tablet take 1 tablet by mouth three times a day  90 tablet  3  . zolpidem (AMBIEN) 10 MG tablet take 1 tablet by mouth at bedtime for sleep  30 tablet  4   No current facility-administered medications for this visit.    Previous Psychotropic Medications:  Medication Dose   ***  ***                     Substance Abuse History in the last 12 months: Substance Age of 1st Use Last Use Amount Specific Type  Nicotine  ***  ***  ***  ***  Alcohol  ***  ***  ***  ***  Cannabis  ***  ***  ***  ***  Opiates  ***  ***  ***  ***  Cocaine  ***  ***  ***  ***  Methamphetamines  ***  ***  ***  ***  LSD  ***  ***  ***  ***  Ecstasy  ***   ***  ***  ***  Benzodiazepines  ***  ***  ***  ***  Caffeine  ***  ***  ***  ***  Inhalants  ***  ***  ***  ***  Others:                          Medical  Consequences of Substance Abuse: ***  Legal Consequences of Substance Abuse: ***  Family Consequences of Substance Abuse: ***  Blackouts:  {BHH YES OR NO:22294} DT's:  {BHH YES OR NO:22294} Withdrawal Symptoms:  {BHH YES OR NO:22294} {Withdrawal Symptoms:22677}  Social History: Current Place of Residence: *** Place of Birth: *** Family Members: *** Marital Status:  {Marital Status:22678} Children: ***  Sons: ***  Daughters: *** Relationships: *** Education:  {Education:22679} Educational Problems/Performance: *** Religious Beliefs/Practices: *** History of Abuse: {Desc; abuse:16542} Occupational Experiences; Military History:  {Military History:22680} Legal History: *** Hobbies/Interests: ***  Family History:   Family History  Problem Relation Age of Onset  . Hypertension Mother   . Stroke Mother   . Cancer Father     lung   . Kidney failure      family history   . Hypertension      family history   . Hypertension Sister   . Hypertension Sister   . Diabetes Sister     Mental Status Examination/Evaluation: Objective:  Appearance: {Appearance:22683}  Eye Contact::  {BHH EYE CONTACT:22684}  Speech:  {Speech:22685}  Volume:  {Volume (PAA):22686}  Mood:  ***  Affect:  {Affect (PAA):22687}  Thought Process:  {Thought Process (PAA):22688}  Orientation:  {BHH ORIENTATION (PAA):22689}  Thought Content:  {Thought Content:22690}  Suicidal Thoughts:  {ST/HT (PAA):22692}  Homicidal Thoughts:  {ST/HT (PAA):22692}  Judgement:  {Judgement (PAA):22694}  Insight:  {Insight (PAA):22695}  Psychomotor Activity:  {Psychomotor (PAA):22696}  Akathisia:  {BHH YES OR NO:22294}  Handed:  {Handed:22697}  AIMS (if indicated):  ***  Assets:  {Assets (PAA):22698}    Laboratory/X-Ray Psychological Evaluation(s)   ***  ***   Assessment:  {axis diagnosis:3049000}  AXIS I {psych axis 1:31909}  AXIS II {psych axis 2:31910}  AXIS III Past Medical History  Diagnosis Date  . DJD  (degenerative joint disease)     of the spine   . GERD (gastroesophageal reflux disease)   . Insomnia   . COPD (chronic obstructive pulmonary disease)   . Obesity   . Anxiety   . Hypothyroidism 2004    left thyroidectomy  . Diabetes mellitus, type 1 1983    type 2 diabetes  . Hypertension 1983  . Hyperlipidemia 2007  . Depression 2008     AXIS IV {psych axis iv:31915}  AXIS V {psych axis v score:31919}   Treatment Plan/Recommendations:  Plan of Care: ***  Laboratory:  {Laboratory:22682}  Psychotherapy: ***  Medications: ***  Routine PRN Medications:  {BHH YES OR NO:22294}  Consultations: ***  Safety Concerns:  ***  Other:      Jahzion Brogden R, PsyD 1/8/20153:52 PM

## 2013-06-09 NOTE — Progress Notes (Signed)
Patient:  [name]   DOB: 11-22-1950  MR Number: [id number]  Location: [facility] [address] [place] [address] Dept: [phone]  Start: 4 PM End: 5 PM   Provider/Observer:     Edgardo Roys PSYD  Chief Complaint:      Chief Complaint  Patient presents with  . Depression    Reason For Service:     The patient was referred for psychotherapeutic interventions because of ongoing symptoms of depression, anxiety, and mood swings including anger outbursts particularly of a verbal nature.  Interventions Strategy:  Cognitive/behavioral psychotherapeutic interventions  Participation Level:   Active  Participation Quality:  Appropriate      Behavioral Observation:  Well Groomed, Alert, and Appropriate.   Current Psychosocial Factors: The patient reports that she is still taking care of niece but this is hard on her but rewarding.  She reports that more depression has lead to her not eating again and she has lost 16lbs since last visit.  Content of Session:   Review current symptoms and continued work on therapeutic interventions for issues of recurrent depression and panic attacks.  Current Status:   The patient reports more depression and feelings of helplessness.    Patient Progress:   Very good considering the situation.  Target Goals:   Target goals have to do with reducing the intensity, duration, and frequency of depressive events as well as reducing the intensity, duration, and frequency of panic attacks. Target goals include building coping skills and strategies around both of these diagnostic issues.  Last Reviewed:   1/6/20015  Goals Addressed Today:    Specific goals addressed today had to do with managing and coping with her cognitive and behavioral responses to stressors particularly around issues that may lead to full-blown panic attacks when she is in medical  situations. These have been particular triggers for her in the past for panic attacks due to her medical illnesses and past medical treatments.  Impression/Diagnosis:   The patient has had a lot of significant medical issues to deal with particularly regarding pulmonary function. However, she also has a long history of depression and panic attacks have become exacerbated due to to persistent and serious medical issues.  Diagnosis:    Axis I:  Major depressive disorder, recurrent severe without psychotic features  Panic disorder with agoraphobia and moderate panic attacks      Axis II: No diagnosis

## 2013-06-24 ENCOUNTER — Other Ambulatory Visit: Payer: Self-pay

## 2013-06-24 MED ORDER — HYDROCODONE-ACETAMINOPHEN 10-325 MG PO TABS: 1.0000 | ORAL_TABLET | Freq: Four times a day (QID) | ORAL | Status: DC | PRN

## 2013-07-07 ENCOUNTER — Ambulatory Visit (INDEPENDENT_AMBULATORY_CARE_PROVIDER_SITE_OTHER): Payer: Self-pay | Admitting: Otolaryngology

## 2013-07-15 ENCOUNTER — Encounter (HOSPITAL_COMMUNITY): Payer: Self-pay | Admitting: Psychology

## 2013-07-15 ENCOUNTER — Ambulatory Visit (INDEPENDENT_AMBULATORY_CARE_PROVIDER_SITE_OTHER): Payer: Medicare Other | Admitting: Psychology

## 2013-07-15 DIAGNOSIS — F332 Major depressive disorder, recurrent severe without psychotic features: Secondary | ICD-10-CM

## 2013-07-15 DIAGNOSIS — F4001 Agoraphobia with panic disorder: Secondary | ICD-10-CM

## 2013-07-15 NOTE — Progress Notes (Signed)
Patient:  [name]   DOB: [birth year]  MR Number: [id number]  Location: [place] [facility] [address] [place] [address] Dept: [phone]  Start: 4 PM End: 5 PM   Provider/Observer:     Edgardo Roys PSYD  Chief Complaint:      Chief Complaint  Patient presents with  . Depression  . Anxiety  . Stress    Reason For Service:     The patient was referred for psychotherapeutic interventions because of ongoing symptoms of depression, anxiety, and mood swings including anger outbursts particularly of a verbal nature.  Interventions Strategy:  Cognitive/behavioral psychotherapeutic interventions  Participation Level:   Active  Participation Quality:  Appropriate      Behavioral Observation:  Well Groomed, Alert, and Appropriate.   Current Psychosocial Factors: The patient reports her sister has been diagnosed with stage 3 lung cancer.  The patient is still taking care of niece and those responsibilities.  Content of Session:   Review current symptoms and continued work on therapeutic interventions for issues of recurrent depression and panic attacks.  Current Status:   The patient reports that as her depression got worse that she stopped eating and reduced fluid intake.  She lost 27lbs in 2 weeks, but after starting back eating the weight returned.    Patient Progress:   Very good considering the situation.  Target Goals:   Target goals have to do with reducing the intensity, duration, and frequency of depressive events as well as reducing the intensity, duration, and frequency of panic attacks. Target goals include building coping skills and strategies around both of these diagnostic issues.  Last Reviewed:   2/13/20015  Goals Addressed Today:    Specific goals addressed today had to do with managing and coping with her cognitive and behavioral responses to stressors particularly  around issues that may lead to full-blown panic attacks when she is in medical situations. These have been particular triggers for her in the past for panic attacks due to her medical illnesses and past medical treatments.  Impression/Diagnosis:   The patient has had a lot of significant medical issues to deal with particularly regarding pulmonary function. However, she also has a long history of depression and panic attacks have become exacerbated due to to persistent and serious medical issues.  Diagnosis:    Axis I:  Major depressive disorder, recurrent severe without psychotic features  Panic disorder with agoraphobia and moderate panic attacks      Axis II: No diagnosis

## 2013-07-21 ENCOUNTER — Other Ambulatory Visit: Payer: Self-pay

## 2013-07-21 MED ORDER — HYDROCODONE-ACETAMINOPHEN 10-325 MG PO TABS: 1.0000 | ORAL_TABLET | Freq: Four times a day (QID) | ORAL | Status: DC | PRN

## 2013-07-22 ENCOUNTER — Other Ambulatory Visit: Payer: Self-pay | Admitting: Family Medicine

## 2013-07-27 ENCOUNTER — Other Ambulatory Visit: Payer: Self-pay | Admitting: Family Medicine

## 2013-08-05 ENCOUNTER — Other Ambulatory Visit: Payer: Self-pay | Admitting: Family Medicine

## 2013-08-05 DIAGNOSIS — Z1231 Encounter for screening mammogram for malignant neoplasm of breast: Secondary | ICD-10-CM

## 2013-08-08 ENCOUNTER — Ambulatory Visit (HOSPITAL_COMMUNITY): Payer: Self-pay

## 2013-08-09 DIAGNOSIS — M899 Disorder of bone, unspecified: Secondary | ICD-10-CM | POA: Diagnosis not present

## 2013-08-09 DIAGNOSIS — M949 Disorder of cartilage, unspecified: Secondary | ICD-10-CM | POA: Diagnosis not present

## 2013-08-09 DIAGNOSIS — IMO0002 Reserved for concepts with insufficient information to code with codable children: Secondary | ICD-10-CM | POA: Diagnosis not present

## 2013-08-09 DIAGNOSIS — E1065 Type 1 diabetes mellitus with hyperglycemia: Secondary | ICD-10-CM | POA: Diagnosis not present

## 2013-08-09 DIAGNOSIS — E039 Hypothyroidism, unspecified: Secondary | ICD-10-CM | POA: Diagnosis not present

## 2013-08-10 ENCOUNTER — Encounter (INDEPENDENT_AMBULATORY_CARE_PROVIDER_SITE_OTHER): Payer: Self-pay

## 2013-08-10 ENCOUNTER — Encounter: Payer: Self-pay | Admitting: Family Medicine

## 2013-08-10 ENCOUNTER — Ambulatory Visit (INDEPENDENT_AMBULATORY_CARE_PROVIDER_SITE_OTHER): Payer: Medicare Other | Admitting: Family Medicine

## 2013-08-10 VITALS — BP 118/74 | HR 94 | Resp 16 | Wt 232.0 lb

## 2013-08-10 DIAGNOSIS — E1165 Type 2 diabetes mellitus with hyperglycemia: Secondary | ICD-10-CM

## 2013-08-10 DIAGNOSIS — E039 Hypothyroidism, unspecified: Secondary | ICD-10-CM

## 2013-08-10 DIAGNOSIS — IMO0001 Reserved for inherently not codable concepts without codable children: Secondary | ICD-10-CM

## 2013-08-10 DIAGNOSIS — R5381 Other malaise: Secondary | ICD-10-CM | POA: Diagnosis not present

## 2013-08-10 DIAGNOSIS — IMO0002 Reserved for concepts with insufficient information to code with codable children: Secondary | ICD-10-CM

## 2013-08-10 DIAGNOSIS — Z794 Long term (current) use of insulin: Secondary | ICD-10-CM

## 2013-08-10 DIAGNOSIS — F17200 Nicotine dependence, unspecified, uncomplicated: Secondary | ICD-10-CM

## 2013-08-10 DIAGNOSIS — E1065 Type 1 diabetes mellitus with hyperglycemia: Secondary | ICD-10-CM

## 2013-08-10 DIAGNOSIS — F3289 Other specified depressive episodes: Secondary | ICD-10-CM

## 2013-08-10 DIAGNOSIS — E669 Obesity, unspecified: Secondary | ICD-10-CM

## 2013-08-10 DIAGNOSIS — J449 Chronic obstructive pulmonary disease, unspecified: Secondary | ICD-10-CM

## 2013-08-10 DIAGNOSIS — R5383 Other fatigue: Secondary | ICD-10-CM | POA: Diagnosis not present

## 2013-08-10 DIAGNOSIS — E785 Hyperlipidemia, unspecified: Secondary | ICD-10-CM | POA: Diagnosis not present

## 2013-08-10 DIAGNOSIS — F329 Major depressive disorder, single episode, unspecified: Secondary | ICD-10-CM

## 2013-08-10 DIAGNOSIS — I10 Essential (primary) hypertension: Secondary | ICD-10-CM

## 2013-08-10 DIAGNOSIS — M479 Spondylosis, unspecified: Secondary | ICD-10-CM

## 2013-08-10 LAB — HEMOGLOBIN A1C
Hgb A1c MFr Bld: 8.1 % — ABNORMAL HIGH (ref <5.7)
Mean Plasma Glucose: 186 mg/dL — ABNORMAL HIGH (ref <117)

## 2013-08-10 LAB — COMPLETE METABOLIC PANEL WITH GFR
ALT: 12 U/L (ref 0–35)
AST: 14 U/L (ref 0–37)
Albumin: 3.3 g/dL — ABNORMAL LOW (ref 3.5–5.2)
Alkaline Phosphatase: 88 U/L (ref 39–117)
BUN: 25 mg/dL — ABNORMAL HIGH (ref 6–23)
CO2: 27 mEq/L (ref 19–32)
Calcium: 9.1 mg/dL (ref 8.4–10.5)
Chloride: 102 mEq/L (ref 96–112)
Creat: 0.93 mg/dL (ref 0.50–1.10)
GFR, Est African American: 77 mL/min
GFR, Est Non African American: 67 mL/min
Glucose, Bld: 149 mg/dL — ABNORMAL HIGH (ref 70–99)
Potassium: 4.7 mEq/L (ref 3.5–5.3)
Sodium: 138 mEq/L (ref 135–145)
Total Bilirubin: 0.3 mg/dL (ref 0.2–1.2)
Total Protein: 6 g/dL (ref 6.0–8.3)

## 2013-08-10 LAB — TSH: TSH: 1.305 u[IU]/mL (ref 0.350–4.500)

## 2013-08-10 LAB — VITAMIN D 25 HYDROXY (VIT D DEFICIENCY, FRACTURES): Vit D, 25-Hydroxy: 48 ng/mL (ref 30–89)

## 2013-08-10 MED ORDER — GLIPIZIDE ER 2.5 MG PO TB24: ORAL_TABLET | ORAL | Status: DC

## 2013-08-10 MED ORDER — ROSUVASTATIN CALCIUM 20 MG PO TABS: ORAL_TABLET | ORAL | Status: DC

## 2013-08-10 MED ORDER — CLONIDINE HCL 0.1 MG PO TABS: ORAL_TABLET | ORAL | Status: DC

## 2013-08-10 NOTE — Patient Instructions (Signed)
F/U in  4 months, call if you need me before  Please schedule and keep your mammogram appointment  Please schedule your eye exam, this appointment  Congrats on reducing to 5 cigarettes per day    It is important that you exercise regularly at least 30 minutes 5 times a week. If you develop chest pain, have severe difficulty breathing, or feel very tired, stop exercising immediately and seek medical attention    A healthy diet is rich in fruit, vegetables and whole grains. Poultry fish, nuts and beans are a healthy choice for protein rather then red meat. A low sodium diet and drinking 64 ounces of water daily is generally recommended. Oils and sweet should be limited. Carbohydrates especially for those who are diabetic or overweight, should be limited to 45 to 60 gram per meal. It is important to eat on a regular schedule, at least 3 times daily. Snacks should be primarily fruits, vegetables or nuts.  Adequate rest, generally 6 to 8 hours per night is important for good health.Good sleep hygiene involves setting a regular bedtime, and turning off all sound and light in your sleep environment.Limiting caffeine intake will also help with the ability to rest well  Lab work needs to be done 3 to 5 days before your follow up visit please.Fasting lipid, cmp and EGFR, HBA1C, TSH, cBC, microalb  All medications need to be brought to every visit  Fall Prevention and Home Safety Falls cause injuries and can affect all age groups. It is possible to prevent falls.  HOW TO PREVENT FALLS  Wear shoes with rubber soles that do not have an opening for your toes.  Keep the inside and outside of your house well lit.  Use night lights throughout your home.  Remove clutter from floors.  Clean up floor spills.  Remove throw rugs or fasten them to the floor with carpet tape.  Do not place electrical cords across pathways.  Put grab bars by your tub, shower, and toilet. Do not use towel bars as grab  bars.  Put handrails on both sides of the stairway. Fix loose handrails.  Do not climb on stools or stepladders, if possible.  Do not wax your floors.  Repair uneven or unsafe sidewalks, walkways, or stairs.  Keep items you use a lot within reach.  Be aware of pets.  Keep emergency numbers next to the telephone.  Put smoke detectors in your home and near bedrooms. Ask your doctor what other things you can do to prevent falls. Document Released: 03/15/2009 Document Revised: 11/18/2011 Document Reviewed: 08/19/2011 [facility] Patient Information 2014 [place], [place].

## 2013-08-14 NOTE — Assessment & Plan Note (Addendum)
Improved, though unwilling tio set a quit date at tssi time Patient counseled for approximately 5 minutes regarding the health risks of ongoing nicotine use, specifically all types of cancer, heart disease, stroke and respiratory failure. The options available for help with cessation ,the behavioral changes to assist the process, and the option to either gradully reduce usage  Or abruptly stop.is also discussed. Pt is also encouraged to set specific goals in number of cigarettes used daily, as well as to set a quit date.

## 2013-08-14 NOTE — Assessment & Plan Note (Signed)
Unchanged, no med chnage

## 2013-08-14 NOTE — Assessment & Plan Note (Signed)
Continues to require supplemental oxygen which will be lifetime Deteriorating COPD due to ongoing and increased nicotine use

## 2013-08-14 NOTE — Assessment & Plan Note (Signed)
Controlled, no change in medication DASH diet and commitment to daily physical activity for a minimum of 30 minutes discussed and encouraged, as a part of hypertension management. The importance of attaining a healthy weight is also discussed.  

## 2013-08-14 NOTE — Progress Notes (Signed)
Subjective:    Patient ID: [name], female    DOB: Sep 30, 1950, 63 y.o.   MRN: [id number]  HPI The PT is here for follow up and re-evaluation of chronic medical conditions, medication management and review of any available recent lab and radiology data.  Preventive health is updated, specifically  Cancer screening and Immunization.  Still needs eye exam and reports failing vision Questions or concerns regarding consultations or procedures which the PT has had in the interim are  Addressed.Has decided to keep local pulmonologist and reports 2 rounds of antibiotics since January The PT denies any adverse reactions to current medications since the last visit.  Mentaly has not been doing too well, went on one of her starvation binges states she weighed 10 pounds less than today not too long ago, she has not been regularly testing blood sugar or taking medicaction as prescribed. Re connected with her therapist, denies suicidal or homicidal ideation     Review of Systems See HPI Denies recent fever or chills. Denies sinus pressure, nasal congestion, ear pain or sore throat. Chronic chest congestion, cough and wheeze Denies chest pains, palpitations and leg swelling Denies abdominal pain, nausea, vomiting,diarrhea or constipation.   Denies dysuria, frequency, hesitancy or incontinence. Chronic  joint pain, swelling and limitation in mobility. Denies headaches, seizures, numbness, or tingling. Denies uncontrolled  depression, anxiety or insomnia.Reports increased personal stress with family illness Denies skin break down or rash.        Objective:   Physical Exam BP 118/74  Pulse 94  Resp 16  Wt 232 lb (105.235 kg)  SpO2 82% Patient alert and oriented and in no cardiopulmonary distress.  HEENT: No facial asymmetry, EOMI, no sinus tenderness,  oropharynx pink and moist.  Neck supple no adenopathy.  Chest: Decreased though adeqtae air entry throughout, scattered wheezes ,  no crackles CVS: S1, S2 no murmurs, no S3.  ABD: Soft non tender. Bowel sounds normal.  Ext: No edema  MS: Adequate though ROM spine, shoulders, hips and knees.  Skin: Intact, no ulcerations or rash noted.  Psych: Good eye contact, normal affect. Memory intact not anxious or depressed appearing.  CNS: CN 2-12 intact, power, tone and sensation normal throughout.        Assessment & Plan:  HYPERTENSION Controlled, no change in medication DASH diet and commitment to daily physical activity for a minimum of 30 minutes discussed and encouraged, as a part of hypertension management. The importance of attaining a healthy weight is also discussed.   Diabetes mellitus, insulin dependent (IDDM), uncontrolled Deterioated, pt has been non compliant with both medication and diet. Plans to change both and does not need any adjustment made to med regime Patient advised to reduce carb and sweets, commit to regular physical activity, take meds as prescribed, test blood as directed, and attempt to lose weight, to improve blood sugar control. Has recently been on a starvation diet and weighed as much as 10 pounds less than she currently does  NICOTINE ADDICTION Improved, though unwilling tio set a quit date at tssi time Patient counseled for approximately 5 minutes regarding the health risks of ongoing nicotine use, specifically all types of cancer, heart disease, stroke and respiratory failure. The options available for help with cessation ,the behavioral changes to assist the process, and the option to either gradully reduce usage  Or abruptly stop.is also discussed. Pt is also encouraged to set specific goals in number of cigarettes used daily, as well as to set  a quit date.   OBESITY Improved. Unfortunately, weight loss has been through unhealthy diet as well as uncontrolled blood sugar. Weight loss goal set for the next several months.   HYPERLIPIDEMIA Hyperlipidemia:Low fat diet  discussed and encouraged.  Updated lab needed at/ before next visit.   DEPRESSION Uncontrolled with mood instability. Pt needs to continue to follow with therapist closely Not suicidal or homicidal  DEGENERATIVE JOINT DISEASE, SPINE Unchanged, no med chnage  HYPOTHYROIDISM Controlled, no change in medication   COPD, severe Continues to require supplemental oxygen which will be lifetime Deteriorating COPD due to ongoing and increased nicotine use

## 2013-08-14 NOTE — Assessment & Plan Note (Signed)
Hyperlipidemia:Low fat diet discussed and encouraged.  Updated lab needed at/ before next visit.  

## 2013-08-14 NOTE — Assessment & Plan Note (Signed)
Improved. Unfortunately, weight loss has been through unhealthy diet as well as uncontrolled blood sugar. Weight loss goal set for the next several months.

## 2013-08-14 NOTE — Assessment & Plan Note (Signed)
Deterioated, pt has been non compliant with both medication and diet. Plans to change both and does not need any adjustment made to med regime Patient advised to reduce carb and sweets, commit to regular physical activity, take meds as prescribed, test blood as directed, and attempt to lose weight, to improve blood sugar control. Has recently been on a starvation diet and weighed as much as 10 pounds less than she currently does

## 2013-08-14 NOTE — Assessment & Plan Note (Signed)
Uncontrolled with mood instability. Pt needs to continue to follow with therapist closely Not suicidal or homicidal

## 2013-08-14 NOTE — Assessment & Plan Note (Signed)
Controlled, no change in medication  

## 2013-08-22 ENCOUNTER — Other Ambulatory Visit: Payer: Self-pay

## 2013-08-22 MED ORDER — HYDROCODONE-ACETAMINOPHEN 10-325 MG PO TABS: 1.0000 | ORAL_TABLET | Freq: Four times a day (QID) | ORAL | Status: DC | PRN

## 2013-08-31 ENCOUNTER — Other Ambulatory Visit: Payer: Self-pay | Admitting: Family Medicine

## 2013-09-01 ENCOUNTER — Ambulatory Visit (HOSPITAL_COMMUNITY)
Admission: RE | Admit: 2013-09-01 | Discharge: 2013-09-01 | Disposition: A | Discharge status: Home / Self Care | Payer: Medicare Other | Source: Ambulatory Visit | Attending: Family Medicine | Admitting: Family Medicine

## 2013-09-01 DIAGNOSIS — Z1231 Encounter for screening mammogram for malignant neoplasm of breast: Secondary | ICD-10-CM

## 2013-09-27 ENCOUNTER — Other Ambulatory Visit: Payer: Self-pay | Admitting: Family Medicine

## 2013-09-28 ENCOUNTER — Telehealth: Payer: Self-pay

## 2013-09-28 ENCOUNTER — Other Ambulatory Visit: Payer: Self-pay

## 2013-09-28 ENCOUNTER — Encounter (HOSPITAL_COMMUNITY): Payer: Self-pay | Admitting: Psychology

## 2013-09-28 ENCOUNTER — Ambulatory Visit (INDEPENDENT_AMBULATORY_CARE_PROVIDER_SITE_OTHER): Payer: Medicare Other | Admitting: Psychology

## 2013-09-28 DIAGNOSIS — F4001 Agoraphobia with panic disorder: Secondary | ICD-10-CM | POA: Diagnosis not present

## 2013-09-28 DIAGNOSIS — F329 Major depressive disorder, single episode, unspecified: Secondary | ICD-10-CM

## 2013-09-28 DIAGNOSIS — F32A Depression, unspecified: Secondary | ICD-10-CM

## 2013-09-28 DIAGNOSIS — F332 Major depressive disorder, recurrent severe without psychotic features: Secondary | ICD-10-CM | POA: Diagnosis not present

## 2013-09-28 MED ORDER — HYDROCODONE-ACETAMINOPHEN 10-325 MG PO TABS
1.0000 | ORAL_TABLET | Freq: Four times a day (QID) | ORAL | Status: DC | PRN
Start: 2013-09-28 — End: 2013-10-19

## 2013-09-28 NOTE — Progress Notes (Signed)
   PROGRESS NOTE  Patient:  [name]   DOB: 07/27/1950  MR Number: [id number]  Location: [facility] [address] [place] [address] Dept: [phone]  Start: 2 PM End: 3 PM   Provider/Observer:     Edgardo Roys PSYD  Chief Complaint:      Chief Complaint  Patient presents with  . Depression  . Panic Attack    Reason For Service:     The patient was referred for psychotherapeutic interventions because of ongoing symptoms of depression, anxiety, and mood swings including anger outbursts particularly of a verbal nature.  Interventions Strategy:  Cognitive/behavioral psychotherapeutic interventions  Participation Level:   Active  Participation Quality:  Appropriate      Behavioral Observation:  Well Groomed, Alert, and Appropriate.   Current Psychosocial Factors: The patient reports her sister has been diagnosed with stage 3 lung cancer.  She has now gone through chemo and is doing ok considering.  The patient reports that she has used her coping skills to deal with this stress and others.  Content of Session:   Review current symptoms and continued work on therapeutic interventions for issues of recurrent depression and panic attacks.  Current Status:   The patient reports that as her depression got worse that she stopped eating and reduced fluid intake.  She lost 27lbs in 2 weeks, but after starting back eating the weight returned.    Patient Progress:   Very good considering the situation.  Target Goals:   Target goals have to do with reducing the intensity, duration, and frequency of depressive events as well as reducing the intensity, duration, and frequency of panic attacks. Target goals include building coping skills and strategies around both of these diagnostic issues.  Last Reviewed:   4/29/20015  Goals Addressed Today:    Specific goals addressed today had to do  with managing and coping with her cognitive and behavioral responses to stressors particularly around issues that may lead to full-blown panic attacks when she is in medical situations. These have been particular triggers for her in the past for panic attacks due to her medical illnesses and past medical treatments.  Impression/Diagnosis:   The patient has had a lot of significant medical issues to deal with particularly regarding pulmonary function. However, she also has a long history of depression and panic attacks have become exacerbated due to to persistent and serious medical issues.  Diagnosis:    Axis I:  Major depressive disorder, recurrent severe without psychotic features  Panic disorder with agoraphobia and moderate panic attacks      Axis II: No diagnosis     Ayona Yniguez R, PsyD 09/28/2013

## 2013-09-28 NOTE — Telephone Encounter (Signed)
pls advise I will not prescribe thi dose , and [name] needs the psychiarist to treat her depression since [name] is requiring more medication thanIi am comfortable with prescribing. This is in her BEST interest, pls refer (already see Dr Jefm Miles, so this is for Dr Harrington Challenger to eval and treat severe depression, I will sign

## 2013-09-29 ENCOUNTER — Other Ambulatory Visit: Payer: Self-pay | Admitting: Family Medicine

## 2013-09-30 NOTE — Telephone Encounter (Signed)
Called pt no answer °

## 2013-10-03 NOTE — Addendum Note (Signed)
Addended by: Denman George B on: 10/03/2013 09:49 AM   Modules accepted: Orders

## 2013-10-03 NOTE — Telephone Encounter (Signed)
Spoke with patient who understands that she will have to referred for this rx.

## 2013-10-10 ENCOUNTER — Ambulatory Visit (HOSPITAL_COMMUNITY): Payer: Self-pay | Admitting: Psychiatry

## 2013-10-19 ENCOUNTER — Other Ambulatory Visit: Payer: Self-pay

## 2013-10-19 MED ORDER — HYDROCODONE-ACETAMINOPHEN 10-325 MG PO TABS: 1.0000 | ORAL_TABLET | Freq: Four times a day (QID) | ORAL | Status: DC | PRN

## 2013-10-28 ENCOUNTER — Ambulatory Visit (HOSPITAL_COMMUNITY): Payer: Self-pay | Admitting: Psychology

## 2013-11-17 ENCOUNTER — Ambulatory Visit (INDEPENDENT_AMBULATORY_CARE_PROVIDER_SITE_OTHER): Payer: Medicare Other | Admitting: Psychology

## 2013-11-17 DIAGNOSIS — F332 Major depressive disorder, recurrent severe without psychotic features: Secondary | ICD-10-CM

## 2013-11-17 DIAGNOSIS — F4001 Agoraphobia with panic disorder: Secondary | ICD-10-CM

## 2013-11-25 ENCOUNTER — Encounter (HOSPITAL_COMMUNITY): Payer: Self-pay | Admitting: Psychology

## 2013-11-25 NOTE — Progress Notes (Signed)
   PROGRESS NOTE  Patient:  [name]   DOB: [birth year]  MR Number: [id number]  Location: [facility] [address] [place] [address] Dept: [phone]  Start: 4 PM End: 5 PM   Provider/Observer:     Edgardo Roys PSYD  Chief Complaint:      Chief Complaint  Patient presents with  . Anxiety  . Depression  . Stress    Reason For Service:     The patient was referred for psychotherapeutic interventions because of ongoing symptoms of depression, anxiety, and mood swings including anger outbursts particularly of a verbal nature.  Interventions Strategy:  Cognitive/behavioral psychotherapeutic interventions  Participation Level:   Active  Participation Quality:  Appropriate      Behavioral Observation:  Well Groomed, Alert, and Appropriate.   Current Psychosocial Factors: The patient reports that things have been relatively quiet as far as social issues. The patient reports that she has continued to improve as far as her breathing and while it is still significantly impaired she is able to do more things without her oxygen tanks. The patient reports that this is quite a relief for her.  Content of Session:   Review current symptoms and continued work on therapeutic interventions for issues of recurrent depression and panic attacks.  Current Status:   The patient reports that her depression has been low better since I saw her the previous visit. She reports that she's been actively working on coping skills and strategies and that her weight changes have stabilized more recently.    Patient Progress:   Very good considering the situation.  Target Goals:   Target goals have to do with reducing the intensity, duration, and frequency of depressive events as well as reducing the intensity, duration, and frequency of panic attacks. Target goals include building coping skills and  strategies around both of these diagnostic issues.  Last Reviewed:   6/18/20015  Goals Addressed Today:    Specific goals addressed today had to do with managing and coping with her cognitive and behavioral responses to stressors particularly around issues that may lead to full-blown panic attacks when she is in medical situations. These have been particular triggers for her in the past for panic attacks due to her medical illnesses and past medical treatments.  Impression/Diagnosis:   The patient has had a lot of significant medical issues to deal with particularly regarding pulmonary function. However, she also has a long history of depression and panic attacks have become exacerbated due to to persistent and serious medical issues.  Diagnosis:    Axis I:  Major depressive disorder, recurrent severe without psychotic features  Panic disorder with agoraphobia and moderate panic attacks      Axis II: No diagnosis     RODENBOUGH,JOHN R, PsyD 11/25/2013

## 2013-11-30 ENCOUNTER — Other Ambulatory Visit: Payer: Self-pay

## 2013-11-30 DIAGNOSIS — S6000XA Contusion of unspecified finger without damage to nail, initial encounter: Secondary | ICD-10-CM | POA: Diagnosis not present

## 2013-11-30 MED ORDER — HYDROCODONE-ACETAMINOPHEN 10-325 MG PO TABS
1.0000 | ORAL_TABLET | Freq: Four times a day (QID) | ORAL | Status: DC | PRN
Start: 2013-11-30 — End: 2013-12-30

## 2013-12-01 ENCOUNTER — Other Ambulatory Visit: Payer: Self-pay | Admitting: Family Medicine

## 2013-12-03 ENCOUNTER — Other Ambulatory Visit: Payer: Self-pay | Admitting: Family Medicine

## 2013-12-03 ENCOUNTER — Telehealth: Payer: Self-pay | Admitting: Family Medicine

## 2013-12-03 MED ORDER — MELOXICAM 15 MG PO TABS: 15.0000 mg | ORAL_TABLET | Freq: Every day | ORAL | Status: DC

## 2013-12-03 MED ORDER — TIZANIDINE HCL 4 MG PO CAPS: 4.0000 mg | ORAL_CAPSULE | Freq: Three times a day (TID) | ORAL | Status: DC

## 2013-12-03 NOTE — Telephone Encounter (Signed)
Pt called on 7/3 for refills on zannaflex, xanax and meloxicam, will refill all 3 for 5 months  Review of record shows that refills are present on her xanax, so may contact pharmacy

## 2013-12-05 ENCOUNTER — Other Ambulatory Visit: Payer: Self-pay

## 2013-12-05 MED ORDER — ALPRAZOLAM 1 MG PO TABS: ORAL_TABLET | ORAL | Status: DC

## 2013-12-05 NOTE — Telephone Encounter (Signed)
Pt called and LMOM that she was wanting to speak with Velna Hatchet about if she had followed up on Dr. Griffin Dakin order about her medication. Please advise [phone]

## 2013-12-14 DIAGNOSIS — R5381 Other malaise: Secondary | ICD-10-CM | POA: Diagnosis not present

## 2013-12-14 DIAGNOSIS — I10 Essential (primary) hypertension: Secondary | ICD-10-CM | POA: Diagnosis not present

## 2013-12-14 DIAGNOSIS — R5383 Other fatigue: Secondary | ICD-10-CM | POA: Diagnosis not present

## 2013-12-14 DIAGNOSIS — E1065 Type 1 diabetes mellitus with hyperglycemia: Secondary | ICD-10-CM | POA: Diagnosis not present

## 2013-12-14 DIAGNOSIS — E785 Hyperlipidemia, unspecified: Secondary | ICD-10-CM | POA: Diagnosis not present

## 2013-12-14 DIAGNOSIS — E039 Hypothyroidism, unspecified: Secondary | ICD-10-CM | POA: Diagnosis not present

## 2013-12-14 DIAGNOSIS — IMO0002 Reserved for concepts with insufficient information to code with codable children: Secondary | ICD-10-CM | POA: Diagnosis not present

## 2013-12-15 ENCOUNTER — Encounter: Payer: Self-pay | Admitting: Family Medicine

## 2013-12-15 ENCOUNTER — Ambulatory Visit (INDEPENDENT_AMBULATORY_CARE_PROVIDER_SITE_OTHER): Payer: Medicare Other | Admitting: Family Medicine

## 2013-12-15 ENCOUNTER — Encounter (INDEPENDENT_AMBULATORY_CARE_PROVIDER_SITE_OTHER): Payer: Self-pay

## 2013-12-15 VITALS — BP 126/68 | HR 60 | Resp 20 | Ht 64.0 in | Wt 229.0 lb

## 2013-12-15 DIAGNOSIS — R443 Hallucinations, unspecified: Secondary | ICD-10-CM

## 2013-12-15 DIAGNOSIS — R0902 Hypoxemia: Secondary | ICD-10-CM

## 2013-12-15 DIAGNOSIS — E039 Hypothyroidism, unspecified: Secondary | ICD-10-CM

## 2013-12-15 DIAGNOSIS — IMO0001 Reserved for inherently not codable concepts without codable children: Secondary | ICD-10-CM

## 2013-12-15 DIAGNOSIS — F17200 Nicotine dependence, unspecified, uncomplicated: Secondary | ICD-10-CM

## 2013-12-15 DIAGNOSIS — J9610 Chronic respiratory failure, unspecified whether with hypoxia or hypercapnia: Secondary | ICD-10-CM | POA: Diagnosis not present

## 2013-12-15 DIAGNOSIS — IMO0002 Reserved for concepts with insufficient information to code with codable children: Secondary | ICD-10-CM

## 2013-12-15 DIAGNOSIS — F329 Major depressive disorder, single episode, unspecified: Secondary | ICD-10-CM

## 2013-12-15 DIAGNOSIS — Z794 Long term (current) use of insulin: Secondary | ICD-10-CM

## 2013-12-15 DIAGNOSIS — R440 Auditory hallucinations: Secondary | ICD-10-CM

## 2013-12-15 DIAGNOSIS — E785 Hyperlipidemia, unspecified: Secondary | ICD-10-CM

## 2013-12-15 DIAGNOSIS — F32A Depression, unspecified: Secondary | ICD-10-CM

## 2013-12-15 DIAGNOSIS — E1065 Type 1 diabetes mellitus with hyperglycemia: Secondary | ICD-10-CM

## 2013-12-15 DIAGNOSIS — E669 Obesity, unspecified: Secondary | ICD-10-CM

## 2013-12-15 DIAGNOSIS — E1165 Type 2 diabetes mellitus with hyperglycemia: Secondary | ICD-10-CM

## 2013-12-15 DIAGNOSIS — J449 Chronic obstructive pulmonary disease, unspecified: Secondary | ICD-10-CM | POA: Diagnosis not present

## 2013-12-15 DIAGNOSIS — J9611 Chronic respiratory failure with hypoxia: Secondary | ICD-10-CM | POA: Insufficient documentation

## 2013-12-15 DIAGNOSIS — F3289 Other specified depressive episodes: Secondary | ICD-10-CM

## 2013-12-15 LAB — MICROALBUMIN / CREATININE URINE RATIO
CREATININE, URINE: 209.8 mg/dL
MICROALB UR: 0.59 mg/dL (ref 0.00–1.89)
Microalb Creat Ratio: 2.8 mg/g (ref 0.0–30.0)

## 2013-12-15 LAB — CBC WITH DIFFERENTIAL/PLATELET
BASOS ABS: 0 10*3/uL (ref 0.0–0.1)
BASOS PCT: 0 % (ref 0–1)
Eosinophils Absolute: 0.7 10*3/uL (ref 0.0–0.7)
Eosinophils Relative: 9 % — ABNORMAL HIGH (ref 0–5)
HCT: 40.5 % (ref 36.0–46.0)
Hemoglobin: 13.5 g/dL (ref 12.0–15.0)
LYMPHS PCT: 43 % (ref 12–46)
Lymphs Abs: 3.3 10*3/uL (ref 0.7–4.0)
MCH: 29.9 pg (ref 26.0–34.0)
MCHC: 33.3 g/dL (ref 30.0–36.0)
MCV: 89.6 fL (ref 78.0–100.0)
Monocytes Absolute: 0.6 10*3/uL (ref 0.1–1.0)
Monocytes Relative: 8 % (ref 3–12)
NEUTROS ABS: 3 10*3/uL (ref 1.7–7.7)
Neutrophils Relative %: 40 % — ABNORMAL LOW (ref 43–77)
Platelets: 302 10*3/uL (ref 150–400)
RBC: 4.52 MIL/uL (ref 3.87–5.11)
RDW: 14.2 % (ref 11.5–15.5)
WBC: 7.6 10*3/uL (ref 4.0–10.5)

## 2013-12-15 LAB — COMPLETE METABOLIC PANEL WITH GFR
ALK PHOS: 80 U/L (ref 39–117)
ALT: 8 U/L (ref 0–35)
AST: 14 U/L (ref 0–37)
Albumin: 4 g/dL (ref 3.5–5.2)
BILIRUBIN TOTAL: 0.4 mg/dL (ref 0.2–1.2)
BUN: 26 mg/dL — ABNORMAL HIGH (ref 6–23)
CO2: 27 meq/L (ref 19–32)
CREATININE: 1.21 mg/dL — AB (ref 0.50–1.10)
Calcium: 9.1 mg/dL (ref 8.4–10.5)
Chloride: 103 mEq/L (ref 96–112)
GFR, EST AFRICAN AMERICAN: 56 mL/min — AB
GFR, EST NON AFRICAN AMERICAN: 48 mL/min — AB
Glucose, Bld: 104 mg/dL — ABNORMAL HIGH (ref 70–99)
Potassium: 5.5 mEq/L — ABNORMAL HIGH (ref 3.5–5.3)
SODIUM: 139 meq/L (ref 135–145)
Total Protein: 6.4 g/dL (ref 6.0–8.3)

## 2013-12-15 LAB — HEMOGLOBIN A1C
Hgb A1c MFr Bld: 7.6 % — ABNORMAL HIGH (ref <5.7)
MEAN PLASMA GLUCOSE: 171 mg/dL — AB (ref <117)

## 2013-12-15 LAB — LIPID PANEL
CHOLESTEROL: 127 mg/dL (ref 0–200)
HDL: 50 mg/dL (ref >39)
LDL Cholesterol: 65 mg/dL (ref 0–99)
TRIGLYCERIDES: 60 mg/dL (ref <150)
Total CHOL/HDL Ratio: 2.5 Ratio
VLDL: 12 mg/dL (ref 0–40)

## 2013-12-15 LAB — TSH: TSH: 2.024 u[IU]/mL (ref 0.350–4.500)

## 2013-12-15 NOTE — Patient Instructions (Addendum)
F/u in mid September, call if you need me before  Your blood pressure, cholesterol and blood sugar are all at goal,. And you have lost weight , congrats.  Since your mental health needs are greater , I am referring you to Dr Harrington Challenger, you will still see Dr Jefm Miles for therapy  You do require oxygen 24 hours daily

## 2013-12-15 NOTE — Progress Notes (Signed)
Subjective:    Patient ID: [name], female    DOB: Oct 13, 1950, 63 y.o.   MRN: [id number]  HPI The PT is here for follow up and re-evaluation of chronic medical conditions, medication management and review of any available recent lab and radiology data.  Preventive health is updated, specifically  Cancer screening and Immunization.   Crushed left thumb in car 3 weeks ago saw wound surgeon, healing well, part of nail is off  The PT denies any adverse reactions to current medications since the last visit.  Increased stress in family due to illness of a sibling, also pt reports the family don't get along with her. Here for oxygen needs, which are establlished,. Oxygen on rooom air at rest  is as documented, 74 % on room air, pt has significant COPD and continues to smoke with no plan to quit in the immediate future, she is again counseled about the n need to work on this for health reasons She denies polyuria, polydipsia or hypoglycemic episodes, and her most recent labs are actually excellent , which is very encouraging   Review of Systems See HPI Denies recent fever or chills. Denies sinus pressure, nasal congestion, ear pain or sore throat. Chronic chest congestion, productive cough and  Wheezing.Short of breath with minimal activity, requires continual oxygen for function Denies chest pains, palpitations and leg swelling Denies abdominal pain, nausea, vomiting,diarrhea or constipation.   Denies dysuria, frequency, hesitancy  Has  Incontinence due to limitation in mobility Chronic joint pain, swelling and limitation in mobility. Denies headaches, seizures, numbness, or tingling. C/o increased  Depression and  Anxiety, panic attacks and insomnia.States she is not getting along with a lot of her family members, but the problem is with them States she spends a lot pof her time alone and talks mainly with deceased family members who she hears and who live with her. She admits to  auditory hallucinations, feels tired , as though she would benefit from 1 month "away some where" , adamantly denies suicidal or homicidal ideation, neither wants rest home placement or psych admission, realizes however that her n mental health is deteriorating. A niece she had housed and was  Helping seemingly has turned against her and this is a clear trigger as well as the fact that one of her sibs is diagnosed with lung cancer, wants psychiatric help Denies skin break down or rash.        Objective:   Physical Exam  BP 126/68  Pulse 60  Resp 20  Ht 5\' 4"  (1.626 m)  Wt 229 lb (103.874 kg)  BMI 39.29 kg/m2  SpO2 74%  Patient alert and oriented and in no cardiopulmonary distress at rest on supplemental oxygen at 2 l/min pulse ox is 94%. Often tearful as she describes her mental health and the deterioration in her personal life, though she assures me that her 2 sisters are supportive of her  HEENT: No facial asymmetry, EOMI,   oropharynx pink and moist.  Neck decreased though adeqaute ROM, no JVd  Chest: markedly decreased  air entry though reduced scattered crackles and wheezes  CVS: S1, S2 no murmurs, no S3.Regular rate.  ABD: Soft non tender.   Ext: No edema  MS:  Reduced ROM spine, shoulders, hips and knees.Ambulates with assistance  Skin: Intact, no ulcerations or rash noted.  Psych: Good eye contact, normal affect. Memory impaired  , anxious and depressed appearing.  CNS: CN 2-12 intact, power,  normal throughout.no focal deficits  noted.       Assessment & Plan:  Chronic respiratory failure with hypoxia Pt has oxygen sat of 74% on room air at rest , up to 92% with supplemental oxygen at rest, she requires continual oxygen  24 hours daily due to her severe COPD  COPD, severe Continues to deteriorate due to ongoing nicotine use, cessation counselling done  Diabetes mellitus, insulin dependent (IDDM), uncontrolled Improved, pt applauded on this and encouraged to  continue to be compliant with diet and medication Patient advised to reduce carb and sweets, commit to regular physical activity, take meds as prescribed, test blood as directed, and attempt to lose weight, to improve blood sugar control.   HYPERLIPIDEMIA Hyperlipidemia:Low fat diet discussed and encouraged.  Controlled, no change in medication   Auditory hallucinations Pt reports continual conversations with her deceased family members who "live with her " and who speak to her all the time, she hears their voices, and she answers. Admits to deterioration in her mental health in the past 2 3 months, wants help. Not suicidal or homicidal. Continues to see her therapist who she finds extremely beneficial, will refer to psych, she agrees  NICOTINE ADDICTION Unchanged, and unwilling/ unablle to commit to quitting at this time Patient counseled for approximately 5 minutes regarding the health risks of ongoing nicotine use, specifically all types of cancer, heart disease, stroke and respiratory failure. The options available for help with cessation ,the behavioral changes to assist the process, and the option to either gradully reduce usage  Or abruptly stop.is also discussed. Pt is also encouraged to set specific goals in number of cigarettes used daily, as well as to set a quit date.   HYPOTHYROIDISM Controlled, no change in medication   OBESITY Improved. Pt applauded on succesful weight loss through lifestyle change, and encouraged to continue same. Weight loss goal set for the next several months.

## 2013-12-15 NOTE — Assessment & Plan Note (Addendum)
Pt has oxygen sat of 74% on room air at rest , up to 92% with supplemental oxygen at rest, she requires continual oxygen  24 hours daily due to her severe COPD

## 2013-12-18 DIAGNOSIS — R440 Auditory hallucinations: Secondary | ICD-10-CM | POA: Insufficient documentation

## 2013-12-18 NOTE — Assessment & Plan Note (Signed)
Improved. Pt applauded on succesful weight loss through lifestyle change, and encouraged to continue same. Weight loss goal set for the next several months.  

## 2013-12-18 NOTE — Assessment & Plan Note (Signed)
Continues to deteriorate due to ongoing nicotine use, cessation counselling done

## 2013-12-18 NOTE — Assessment & Plan Note (Signed)
Improved, pt applauded on this and encouraged to continue to be compliant with diet and medication Patient advised to reduce carb and sweets, commit to regular physical activity, take meds as prescribed, test blood as directed, and attempt to lose weight, to improve blood sugar control.

## 2013-12-18 NOTE — Assessment & Plan Note (Signed)
Controlled, no change in medication  

## 2013-12-18 NOTE — Assessment & Plan Note (Signed)
Unchanged, and unwilling/ unablle to commit to quitting at this time Patient counseled for approximately 5 minutes regarding the health risks of ongoing nicotine use, specifically all types of cancer, heart disease, stroke and respiratory failure. The options available for help with cessation ,the behavioral changes to assist the process, and the option to either gradully reduce usage  Or abruptly stop.is also discussed. Pt is also encouraged to set specific goals in number of cigarettes used daily, as well as to set a quit date.

## 2013-12-18 NOTE — Assessment & Plan Note (Signed)
Hyperlipidemia:Low fat diet discussed and encouraged.  Controlled, no change in medication   

## 2013-12-18 NOTE — Assessment & Plan Note (Signed)
Pt reports continual conversations with her deceased family members who "live with her " and who speak to her all the time, she hears their voices, and she answers. Admits to deterioration in her mental health in the past 2 3 months, wants help. Not suicidal or homicidal. Continues to see her therapist who she finds extremely beneficial, will refer to psych, she agrees

## 2013-12-20 ENCOUNTER — Other Ambulatory Visit: Payer: Self-pay

## 2013-12-20 MED ORDER — ESOMEPRAZOLE MAGNESIUM 40 MG PO CPDR: DELAYED_RELEASE_CAPSULE | ORAL | Status: DC

## 2013-12-20 MED ORDER — FLUOXETINE HCL 40 MG PO CAPS: ORAL_CAPSULE | ORAL | Status: DC

## 2013-12-20 MED ORDER — FUROSEMIDE 40 MG PO TABS: ORAL_TABLET | ORAL | Status: DC

## 2013-12-20 MED ORDER — CLONIDINE HCL 0.1 MG PO TABS: ORAL_TABLET | ORAL | Status: DC

## 2013-12-20 MED ORDER — MONTELUKAST SODIUM 10 MG PO TABS: ORAL_TABLET | ORAL | Status: DC

## 2013-12-23 ENCOUNTER — Ambulatory Visit (INDEPENDENT_AMBULATORY_CARE_PROVIDER_SITE_OTHER): Payer: Medicare Other | Admitting: Psychology

## 2013-12-23 DIAGNOSIS — F332 Major depressive disorder, recurrent severe without psychotic features: Secondary | ICD-10-CM | POA: Diagnosis not present

## 2013-12-23 DIAGNOSIS — F4001 Agoraphobia with panic disorder: Secondary | ICD-10-CM | POA: Diagnosis not present

## 2013-12-28 ENCOUNTER — Encounter (HOSPITAL_COMMUNITY): Payer: Self-pay | Admitting: Psychology

## 2013-12-28 NOTE — Progress Notes (Signed)
   PROGRESS NOTE  Patient:  [name]   DOB: [birth year]  MR Number: [id number]  Location: [facility] [address] [place] [address] Dept: [phone]  Start: 4 PM End: 5 PM   Provider/Observer:     Edgardo Roys PSYD  Chief Complaint:      Chief Complaint  Patient presents with  . Anxiety  . Depression    Reason For Service:     The patient was referred for psychotherapeutic interventions because of ongoing symptoms of depression, anxiety, and mood swings including anger outbursts particularly of a verbal nature.  Interventions Strategy:  Cognitive/behavioral psychotherapeutic interventions  Participation Level:   Active  Participation Quality:  Appropriate      Behavioral Observation:  Well Groomed, Alert, and Appropriate.   Current Psychosocial Factors: The patient reports that clots is telling him that things have been getting better during her last visit she admits that she was somewhat discontinuous. She reports that while the psychosocial stressors that, and quiet down great deal that she was actually not doing better as far as her pulmonary function and was losing motivation to take care of herself. The patient reports that she stopped using her oxygen many situations and that as she began having physical problems and noted that her Lanoxin levels have dropped significantly she is now on increased levels of oxygen.  Content of Session:   Review current symptoms and continued work on therapeutic interventions for issues of recurrent depression and panic attacks.  Current Status:   The patient reports that her depression has getting worse recently in that she had been somewhat dishonest to her doctors including myself regarding how she was doing from a physical standpoint. She was hoping and dealing like she was doing better and was not needing her oxygen as much but in  fact this was more of her feelings of hopelessness and helplessness and a deep desire to be better but feels like she could not improved.    Patient Progress:   Very good considering the situation.  Target Goals:   Target goals have to do with reducing the intensity, duration, and frequency of depressive events as well as reducing the intensity, duration, and frequency of panic attacks. Target goals include building coping skills and strategies around both of these diagnostic issues.  Last Reviewed:   7/24/20015  Goals Addressed Today:    Specific goals addressed today had to do with managing and coping with her cognitive and behavioral responses to stressors particularly around issues that may lead to full-blown panic attacks when she is in medical situations. These have been particular triggers for her in the past for panic attacks due to her medical illnesses and past medical treatments.  Impression/Diagnosis:   The patient has had a lot of significant medical issues to deal with particularly regarding pulmonary function. However, she also has a long history of depression and panic attacks have become exacerbated due to to persistent and serious medical issues.  Diagnosis:    Axis I:  Major depressive disorder, recurrent severe without psychotic features  Panic disorder with agoraphobia and moderate panic attacks      Axis II: No diagnosis     Oreoluwa Aigner R, PsyD 12/28/2013

## 2013-12-29 ENCOUNTER — Other Ambulatory Visit: Payer: Self-pay | Admitting: Family Medicine

## 2013-12-30 ENCOUNTER — Other Ambulatory Visit: Payer: Self-pay

## 2013-12-30 MED ORDER — HYDROCODONE-ACETAMINOPHEN 10-325 MG PO TABS: 1.0000 | ORAL_TABLET | Freq: Four times a day (QID) | ORAL | Status: DC | PRN

## 2014-01-26 ENCOUNTER — Encounter (HOSPITAL_COMMUNITY): Payer: Self-pay | Admitting: Psychology

## 2014-01-26 ENCOUNTER — Ambulatory Visit (INDEPENDENT_AMBULATORY_CARE_PROVIDER_SITE_OTHER): Payer: Medicare Other | Admitting: Psychology

## 2014-01-26 ENCOUNTER — Other Ambulatory Visit: Payer: Self-pay

## 2014-01-26 DIAGNOSIS — F4001 Agoraphobia with panic disorder: Secondary | ICD-10-CM | POA: Diagnosis not present

## 2014-01-26 DIAGNOSIS — F332 Major depressive disorder, recurrent severe without psychotic features: Secondary | ICD-10-CM | POA: Diagnosis not present

## 2014-01-26 MED ORDER — HYDROCODONE-ACETAMINOPHEN 10-325 MG PO TABS: 1.0000 | ORAL_TABLET | Freq: Four times a day (QID) | ORAL | Status: DC | PRN

## 2014-01-26 NOTE — Progress Notes (Signed)
   PROGRESS NOTE  Patient:  [name]   DOB: 11/07/1950  MR Number: [id number]  Location: [facility] [address] [place] [address] Dept: [phone]  Start: 2 PM End: 3 PM   Provider/Observer:     Edgardo Roys PSYD  Chief Complaint:      Chief Complaint  Patient presents with  . Depression  . Anxiety    Reason For Service:     The patient was referred for psychotherapeutic interventions because of ongoing symptoms of depression, anxiety, and mood swings including anger outbursts particularly of a verbal nature.  Interventions Strategy:  Cognitive/behavioral psychotherapeutic interventions  Participation Level:   Active  Participation Quality:  Appropriate      Behavioral Observation:  Well Groomed, Alert, and Appropriate.   Current Psychosocial Factors: The patient reports that she has stated using her oxygen more consistent and it has helped more and more.  She relates that this is due to her accepting more and more her limitations.  Content of Session:   Review current symptoms and continued work on therapeutic interventions for issues of recurrent depression and panic attacks.  Current Status:   The patient reports that her depression has gotten better over the past month and taking more care of self.    Patient Progress:   Very good considering the situation.  Target Goals:   Target goals have to do with reducing the intensity, duration, and frequency of depressive events as well as reducing the intensity, duration, and frequency of panic attacks. Target goals include building coping skills and strategies around both of these diagnostic issues.  Last Reviewed:   8/27/20015  Goals Addressed Today:    Specific goals addressed today had to do with managing and coping with her cognitive and behavioral responses to stressors particularly around issues that may lead to  full-blown panic attacks when she is in medical situations. These have been particular triggers for her in the past for panic attacks due to her medical illnesses and past medical treatments.  Impression/Diagnosis:   The patient has had a lot of significant medical issues to deal with particularly regarding pulmonary function. However, she also has a long history of depression and panic attacks have become exacerbated due to to persistent and serious medical issues.  Diagnosis:    Axis I:  Major depressive disorder, recurrent severe without psychotic features  Panic disorder with agoraphobia and moderate panic attacks      Axis II: No diagnosis     Jakayla Schweppe R, PsyD 01/26/2014

## 2014-01-31 ENCOUNTER — Other Ambulatory Visit: Payer: Self-pay | Admitting: Family Medicine

## 2014-01-31 ENCOUNTER — Ambulatory Visit (HOSPITAL_COMMUNITY): Payer: Self-pay | Admitting: Psychiatry

## 2014-02-09 DIAGNOSIS — J449 Chronic obstructive pulmonary disease, unspecified: Secondary | ICD-10-CM | POA: Diagnosis not present

## 2014-02-09 DIAGNOSIS — E109 Type 1 diabetes mellitus without complications: Secondary | ICD-10-CM | POA: Diagnosis not present

## 2014-02-09 DIAGNOSIS — I10 Essential (primary) hypertension: Secondary | ICD-10-CM | POA: Diagnosis not present

## 2014-02-09 DIAGNOSIS — J8410 Pulmonary fibrosis, unspecified: Secondary | ICD-10-CM | POA: Diagnosis not present

## 2014-02-10 ENCOUNTER — Other Ambulatory Visit: Payer: Self-pay

## 2014-02-10 MED ORDER — HYDROCODONE-ACETAMINOPHEN 10-325 MG PO TABS
1.0000 | ORAL_TABLET | Freq: Four times a day (QID) | ORAL | Status: DC | PRN
Start: 2014-02-10 — End: 2014-03-20

## 2014-02-14 ENCOUNTER — Ambulatory Visit (HOSPITAL_COMMUNITY): Payer: Self-pay | Admitting: Psychiatry

## 2014-02-24 ENCOUNTER — Ambulatory Visit (INDEPENDENT_AMBULATORY_CARE_PROVIDER_SITE_OTHER): Payer: Medicare Other | Admitting: Psychology

## 2014-02-24 ENCOUNTER — Encounter (HOSPITAL_COMMUNITY): Payer: Self-pay | Admitting: Psychology

## 2014-02-24 DIAGNOSIS — F332 Major depressive disorder, recurrent severe without psychotic features: Secondary | ICD-10-CM

## 2014-02-24 DIAGNOSIS — F4001 Agoraphobia with panic disorder: Secondary | ICD-10-CM | POA: Diagnosis not present
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
## 2014-02-27 ENCOUNTER — Other Ambulatory Visit: Payer: Self-pay | Admitting: Family Medicine

## 2014-03-09 ENCOUNTER — Ambulatory Visit (INDEPENDENT_AMBULATORY_CARE_PROVIDER_SITE_OTHER): Payer: Medicare Other | Admitting: Family Medicine

## 2014-03-09 ENCOUNTER — Encounter: Payer: Self-pay | Admitting: Family Medicine

## 2014-03-09 VITALS — BP 102/62 | HR 85 | Resp 16 | Ht 64.0 in | Wt 222.1 lb

## 2014-03-09 DIAGNOSIS — J449 Chronic obstructive pulmonary disease, unspecified: Secondary | ICD-10-CM | POA: Diagnosis not present

## 2014-03-09 DIAGNOSIS — F32A Depression, unspecified: Secondary | ICD-10-CM

## 2014-03-09 DIAGNOSIS — Z794 Long term (current) use of insulin: Principal | ICD-10-CM

## 2014-03-09 DIAGNOSIS — F329 Major depressive disorder, single episode, unspecified: Secondary | ICD-10-CM

## 2014-03-09 DIAGNOSIS — I10 Essential (primary) hypertension: Secondary | ICD-10-CM

## 2014-03-09 DIAGNOSIS — E785 Hyperlipidemia, unspecified: Secondary | ICD-10-CM

## 2014-03-09 DIAGNOSIS — E039 Hypothyroidism, unspecified: Secondary | ICD-10-CM

## 2014-03-09 DIAGNOSIS — E1165 Type 2 diabetes mellitus with hyperglycemia: Principal | ICD-10-CM

## 2014-03-09 DIAGNOSIS — E1065 Type 1 diabetes mellitus with hyperglycemia: Secondary | ICD-10-CM

## 2014-03-09 DIAGNOSIS — IMO0001 Reserved for inherently not codable concepts without codable children: Secondary | ICD-10-CM

## 2014-03-09 DIAGNOSIS — F418 Other specified anxiety disorders: Secondary | ICD-10-CM

## 2014-03-09 DIAGNOSIS — F17210 Nicotine dependence, cigarettes, uncomplicated: Secondary | ICD-10-CM

## 2014-03-09 DIAGNOSIS — F419 Anxiety disorder, unspecified: Secondary | ICD-10-CM

## 2014-03-09 DIAGNOSIS — F17200 Nicotine dependence, unspecified, uncomplicated: Secondary | ICD-10-CM

## 2014-03-09 NOTE — Patient Instructions (Addendum)
Annual wellness in  3 month, ca;ll if  You need me before  Nurse visit for flu vaccine on 10/15 2015 also HBa1C and chem 7 and EGFR on 03/16/2014   Blood pressure today is very good today and you continue to lose weight which is good  Please continue to work on smoking cessation you need to quit, now you are down to 7 per day   From foot exam today you do qualify for shoes, let us know where and when to send in script   Fasting lipid, cmp and eGFr, hBA1C and TSH in 3.5 month

## 2014-03-16 ENCOUNTER — Ambulatory Visit: Payer: Medicare Other

## 2014-03-20 ENCOUNTER — Other Ambulatory Visit: Payer: Self-pay

## 2014-03-20 MED ORDER — HYDROCODONE-ACETAMINOPHEN 10-325 MG PO TABS: 1.0000 | ORAL_TABLET | Freq: Four times a day (QID) | ORAL | Status: DC | PRN

## 2014-03-21 ENCOUNTER — Other Ambulatory Visit: Payer: Self-pay | Admitting: Family Medicine

## 2014-03-21 ENCOUNTER — Other Ambulatory Visit: Payer: Self-pay

## 2014-03-21 ENCOUNTER — Telehealth: Payer: Self-pay | Admitting: Family Medicine

## 2014-03-21 MED ORDER — PENICILLIN V POTASSIUM 500 MG PO TABS: 500.0000 mg | ORAL_TABLET | Freq: Three times a day (TID) | ORAL | Status: DC

## 2014-03-21 NOTE — Telephone Encounter (Signed)
Pt aware.

## 2014-03-21 NOTE — Telephone Encounter (Signed)
pls send in pen v 500mg  one 3 times daily for 1 week and let her know

## 2014-03-21 NOTE — Telephone Encounter (Signed)
Patient has took 2 rounds of z pak and 1 round of prednisone and wants to know will Dr call in Bactrim DS she has gave that to her before [facility] and spits up clear to yellow no fever and has no trouble breathing

## 2014-03-21 NOTE — Telephone Encounter (Signed)
Patient called back scratch off the bactrim and needs something for bacterial in chest that is what the yellow means

## 2014-03-27 ENCOUNTER — Ambulatory Visit (HOSPITAL_COMMUNITY): Payer: Self-pay | Admitting: Psychology

## 2014-03-30 ENCOUNTER — Ambulatory Visit (INDEPENDENT_AMBULATORY_CARE_PROVIDER_SITE_OTHER): Payer: Medicare Other | Admitting: Psychology

## 2014-03-30 ENCOUNTER — Telehealth: Payer: Self-pay | Admitting: Family Medicine

## 2014-03-30 DIAGNOSIS — F332 Major depressive disorder, recurrent severe without psychotic features: Secondary | ICD-10-CM | POA: Diagnosis not present

## 2014-03-30 DIAGNOSIS — F322 Major depressive disorder, single episode, severe without psychotic features: Secondary | ICD-10-CM

## 2014-03-30 DIAGNOSIS — F4001 Agoraphobia with panic disorder: Secondary | ICD-10-CM

## 2014-03-30 DIAGNOSIS — R443 Hallucinations, unspecified: Secondary | ICD-10-CM

## 2014-03-30 NOTE — Telephone Encounter (Signed)
Referral to see Dr Harrington Challenger is entered My office notes are available for Dr Harrington Challenger to see. I will send a msg to Dr Harrington Challenger just to make her aware of pt's fear of psych eval??/

## 2014-03-30 NOTE — Telephone Encounter (Signed)
Dr Sima Matas has spoke with patient and she does need to see Dr Harrington Challenger and patient has cancelled 3 times out of fear of seeing her and now is ready to see Dr Harrington Challenger Dr Sima Matas would like for you to drop a note to Dr Harrington Challenger and Bertram Millard told patient that she would ask Dr Harrington Challenger and call patient to let her know patient would like for you to please tal with Dr Harrington Challenger

## 2014-03-31 ENCOUNTER — Telehealth: Payer: Self-pay | Admitting: Family Medicine

## 2014-03-31 ENCOUNTER — Encounter (HOSPITAL_COMMUNITY): Payer: Self-pay | Admitting: Psychology
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
## 2014-03-31 NOTE — Telephone Encounter (Signed)
Patient aware and will call and schedule

## 2014-03-31 NOTE — Telephone Encounter (Signed)
Pls call pt explain that i have been in touch with Dr Harrington Challenger. It  is ESSENTIAL that she keep her appt and not cancel as she has in the past'  She does need her expertise , both Dr Jefm Miles and myself agree on this and she also knows that, so she needs to do what is right for her and KEEP her appt with Dr Harrington Challenger. Let her know that if she does not, she will have no future appts scheduled with Dr Harrington Challenger per office policy also please, which is reasonable

## 2014-04-04 ENCOUNTER — Ambulatory Visit (INDEPENDENT_AMBULATORY_CARE_PROVIDER_SITE_OTHER): Payer: Medicare Other | Admitting: Psychiatry

## 2014-04-04 ENCOUNTER — Encounter (HOSPITAL_COMMUNITY): Payer: Self-pay | Admitting: Psychiatry

## 2014-04-04 VITALS — BP 132/80 | HR 82 | Ht 64.0 in | Wt 221.0 lb

## 2014-04-04 DIAGNOSIS — F333 Major depressive disorder, recurrent, severe with psychotic symptoms: Secondary | ICD-10-CM

## 2014-04-04 DIAGNOSIS — F332 Major depressive disorder, recurrent severe without psychotic features: Secondary | ICD-10-CM

## 2014-04-04 MED ORDER — FLUOXETINE HCL 20 MG PO CAPS: ORAL_CAPSULE | ORAL | Status: DC

## 2014-04-04 MED ORDER — BREXPIPRAZOLE 2 MG PO TABS: 2.0000 mg | ORAL_TABLET | Freq: Every day | ORAL | Status: DC

## 2014-04-04 NOTE — Progress Notes (Signed)
Psychiatric Assessment Adult  Patient Identification:  [name] Date of Evaluation:  04/04/2014 Chief Complaint: depression History of Chief Complaint:   Chief Complaint  Patient presents with  . Depression  . Hallucinations  . Establish Care    HPIthis patient is a 62 year old widowed black female who lives alone in [place]. She has no children. She is on disability.  The patient was referred by Dr. Moshe Cipro her primary care physician and Dr. Jefm Miles her psychologist for further evaluation and treatment of major depression and anxiety.  The patient states that she's been depressed since her teen years. She's not really sure why. When she was in college she went through severe depression and had to quit. She's had this on and off through the years but really not receive treatment until a couple years ago after her husband died. Her primary care physician has put her on Prozac and Xanax which have helped to some degree.however recently she has been getting more depressed. She has severe COPD and is on oxygen. Yet, she continues to smoke THREE PACKS PER DAY which she claims is secondary to her depressed mood.  Recently she's gone through more deaths including her sister in 2014 a niece in 2014. Another sister was recently diagnosed with cancer. The patient has gotten overwhelmed by all this and claims it's getting hard for her to just get out of bed and function. She has no energy, she's been shaky and having tremors in her hands, she complains of short-term memory loss.her sleep is variable and Ambien sometimes helps. Her appetite is up and down. Sometimes she hears the voices of her deceased relatives or "feels her spirits." She's never been to a psychiatric hospitalization  But has been seeing Dr. Jefm Miles in our office for several years. She has never been suicidal and states she is very "spiritual and values life. She stays away from people but she has a god niece and  God sister  who check on her faithfully.she denies any other psychotic symptoms and does not use drugs or alcohol Review of Systems  Constitutional: Positive for activity change, appetite change and fatigue.  HENT: Negative.   Eyes: Negative.   Respiratory: Positive for shortness of breath.   Cardiovascular: Negative.   Gastrointestinal: Negative.   Endocrine: Negative.   Genitourinary: Negative.   Musculoskeletal: Positive for arthralgias.  Skin: Negative.   Allergic/Immunologic: Negative.   Neurological: Positive for tremors.  Hematological: Negative.   Psychiatric/Behavioral: Positive for hallucinations, sleep disturbance and dysphoric mood. The patient is nervous/anxious.    Physical Examnot done  Depressive Symptoms: depressed mood, anhedonia, psychomotor retardation, difficulty concentrating, impaired memory, anxiety, disturbed sleep, decreased appetite,  (Hypo) Manic Symptoms:   Elevated Mood:  No Irritable Mood:  No Grandiosity:  No Distractibility:  No Labiality of Mood:  No Delusions:  No Hallucinations:  Yes Impulsivity:  Yes Sexually Inappropriate Behavior:  No Financial Extravagance:  No Flight of Ideas:  No  Anxiety Symptoms: Excessive Worry:  Yes Panic Symptoms:  No Agoraphobia:  No Obsessive Compulsive: No  Symptoms: None, Specific Phobias:  No Social Anxiety:  No  Psychotic Symptoms:  Hallucinations: Yes Auditory Delusions:  No Paranoia:  No   Ideas of Reference:  No  PTSD Symptoms: Ever had a traumatic exposure:  No Had a traumatic exposure in the last month:  No Re-experiencing: No None Hypervigilance:  No Hyperarousal: No None Avoidance: No None  Traumatic Brain Injury: No   Past Psychiatric History: Diagnosis:Maj. depression  Hospitalizations: none  Outpatient Care: has been seeing Dr. Jefm Miles in our office for counseling  Substance Abuse Care: none  Self-Mutilation: none  Suicidal Attempts:none  Violent Behaviors: none   Past  Medical History:   Past Medical History  Diagnosis Date  . DJD (degenerative joint disease)     of the spine   . GERD (gastroesophageal reflux disease)   . Insomnia   . COPD (chronic obstructive pulmonary disease)   . Obesity   . Anxiety   . Hypothyroidism 2004    left thyroidectomy  . Diabetes mellitus, type 1 1983    type 2 diabetes  . Hypertension 1983  . Hyperlipidemia 2007  . Depression 2008   History of Loss of Consciousness:  No Seizure History:  No Cardiac History:  No Allergies:   Allergies  Allergen Reactions  . Codeine     REACTION: nausea   Current Medications:  Current Outpatient Prescriptions  Medication Sig Dispense Refill  . ADVAIR DISKUS 250-50 MCG/DOSE AEPB inhale 1 dose by mouth twice a day 60 each 4  . ALPRAZolam (XANAX) 1 MG tablet TAKE 1 TABLET BY MOUTH 4 TIMES A DAY 120 tablet 4  . aspirin (ASPIRIN LOW DOSE) 81 MG EC tablet Take 81 mg by mouth daily.      . B-D INS SYR ULTRAFINE 1CC/31G 31G X 5/16" 1 ML MISC use as directed FOR ONCE DAILY TESTING 100 each 5  . calcium carbonate (TUMS) 500 MG chewable tablet Chew 1 tablet by mouth as needed.      . cloNIDine (CATAPRES) 0.1 MG tablet Take 0.1 mg by mouth daily.    . CRESTOR 20 MG tablet TAKE ONE TABLET BY MOUTH ONCE DAILY 30 tablet 4  . esomeprazole (NEXIUM) 40 MG capsule take 1 capsule by mouth once daily BEFORE BREAKFAST 30 capsule 4  . FLUoxetine (PROZAC) 40 MG capsule take 1 capsule by mouth once daily 30 capsule 4  . Foot Care Products (DIABETIC INSOLES) MISC by Does not apply route. And Shoes    . furosemide (LASIX) 40 MG tablet take 1 tablet once daily 30 tablet 4  . GLIPIZIDE XL 2.5 MG 24 hr tablet TAKE ONE TABLET BY MOUTH ONCE DAILY 30 tablet 4  . Glucose Blood (BAYER BREEZE 2 TEST) DISK by In Vitro route. Lancets and strips for four times a day testing     . guaiFENesin 100 MG/5ML SOLN Take 100 mg by mouth 4 (four) times daily. 15 ml by mouth 4 times a day     . HYDROcodone-acetaminophen  (NORCO) 10-325 MG per tablet Take 1 tablet by mouth every 6 (six) hours as needed. 120 tablet 0  . insulin glargine (LANTUS) 100 UNIT/ML injection 50 units once daily 30 mL 3  . ipratropium-albuterol (DUONEB) 0.5-2.5 (3) MG/3ML SOLN Take 3 mLs by nebulization 4 (four) times daily.      Marland Kitchen levalbuterol (XOPENEX HFA) 45 MCG/ACT inhaler Inhale 1-2 puffs into the lungs as needed.      Marland Kitchen levothyroxine (SYNTHROID, LEVOTHROID) 100 MCG tablet take 1 tablet by mouth once daily 30 tablet 2  . lisinopril-hydrochlorothiazide (PRINZIDE,ZESTORETIC) 20-12.5 MG per tablet take 1 tablet by mouth once daily 30 tablet 4  . meloxicam (MOBIC) 15 MG tablet take 1 tablet by mouth once daily 30 tablet 3  . montelukast (SINGULAIR) 10 MG tablet TAKE ONE TABLET BY MOUTH ONCE DAILY AT BEDTIME 30 tablet 4  . potassium chloride 20 MEQ/15ML (10%) solution TAKE 1 AND 1/2 TEASPOONFUL BY MOUTH ONCE DAILY 240  mL 2  . tiZANidine (ZANAFLEX) 4 MG capsule Take 1 capsule (4 mg total) by mouth 3 (three) times daily. 90 capsule 4  . zolpidem (AMBIEN) 10 MG tablet take 1 tablet by mouth at bedtime for sleep 30 tablet 4  . Brexpiprazole (REXULTI) 2 MG TABS Take 2 mg by mouth daily. 30 tablet 2  . FLUoxetine (PROZAC) 20 MG capsule Take two in the am and one in the evening 90 capsule 2   No current facility-administered medications for this visit.    Previous Psychotropic Medications:  Medication Dose   Wellbutrin-caused irritability                       Substance Abuse History in the last 12 months: Substance Age of 1st Use Last Use Amount Specific Type  Nicotine   Smokes 3 packs of cigarettes per day   Alcohol      Cannabis      Opiates      Cocaine      Methamphetamines      LSD      Ecstasy      Benzodiazepines      Caffeine      Inhalants      Others:                          Medical Consequences of Substance Abuse: smoking is making her COPD much worse  Legal Consequences of Substance Abuse: none  Family  Consequences of Substance Abuse: none  Blackouts:  No DT's:  No Withdrawal Symptoms:  No None  Social History: Current Place of Residence: [place] of Birth: [place] Family Members: 2 sisters Marital Status:  Widowed Children: none   Relationships:  Education:  Dentist Problems/Performance:  Religious Beliefs/Practices: Christian History of Abuse: none Energy manager History:  None. Legal History: none Hobbies/Interests: listening to gospel music  Family History:   Family History  Problem Relation Age of Onset  . Hypertension Mother   . Stroke Mother   . Cancer Father     lung   . Kidney failure      family history   . Hypertension      family history   . Hypertension Sister   . Depression Sister   . Alcohol abuse Sister   . Hypertension Sister   . Depression Sister   . Diabetes Sister   . Depression Sister     Mental Status Examination/Evaluation: Objective:  Appearance: Casual and Fairly Groomed  using portable oxygen  Eye Contact::  Good  Speech:  Clear and Coherent  Volume:  Decreased  Mood:  Depressed, tearful at times  Affect:  Constricted, Depressed and Tearful  Thought Process:  Circumstantial  Orientation:  Full (Time, Place, and Person)  Thought Content:  Rumination with auditory hallucinations at times of deceased relatives  Suicidal Thoughts:  No  Homicidal Thoughts:  No  Judgement:  Fair  Insight:  Fair  Psychomotor Activity:  Decreased  Akathisia:  No  Handed:  Right  AIMS (if indicated):    Assets:  Communication Skills Desire for Improvement Social Support    Laboratory/X-Ray Psychological Evaluation(s)   reviewed in chart     Assessment:  Axis I: Major Depression, Recurrent severe  AXIS I Major Depression, Recurrent severe  AXIS II Deferred  AXIS III Past Medical History  Diagnosis Date  . DJD (degenerative joint disease)  of the spine   . GERD  (gastroesophageal reflux disease)   . Insomnia   . COPD (chronic obstructive pulmonary disease)   . Obesity   . Anxiety   . Hypothyroidism 2004    left thyroidectomy  . Diabetes mellitus, type 1 1983    type 2 diabetes  . Hypertension 1983  . Hyperlipidemia 2007  . Depression 2008     AXIS IV problems related to social environment  AXIS V 41-50 serious symptoms   Treatment Plan/Recommendations:  Plan of Care: medication management  Laboratory:   Psychotherapy: she is seeing Dr. Jefm Miles here  Medications: she'll increase Prozac to 60 mg daily for depression. An add Rexulti for augmentation.she will start with 0.5 mg daily for one week, then increase to 1 mg daily for one week and then go to 2 mg daily  Routine PRN Medications:  No  Consultations:   Safety Concerns:  She denies thoughts or plans of self-harm  Other:she'll return in 4 weeks      Khanh Cordner, Neoma Laming, MD 11/3/201510:45 AM

## 2014-04-04 NOTE — Telephone Encounter (Signed)
Noted and patient aware.

## 2014-04-13 ENCOUNTER — Other Ambulatory Visit: Payer: Self-pay

## 2014-04-13 MED ORDER — HYDROCODONE-ACETAMINOPHEN 10-325 MG PO TABS: 1.0000 | ORAL_TABLET | Freq: Four times a day (QID) | ORAL | Status: DC | PRN

## 2014-04-20 ENCOUNTER — Other Ambulatory Visit: Payer: Self-pay

## 2014-04-20 MED ORDER — HYDROCODONE-ACETAMINOPHEN 10-325 MG PO TABS: 1.0000 | ORAL_TABLET | Freq: Four times a day (QID) | ORAL | Status: DC | PRN

## 2014-04-26 ENCOUNTER — Telehealth: Payer: Self-pay | Admitting: Family Medicine

## 2014-04-26 ENCOUNTER — Other Ambulatory Visit: Payer: Self-pay | Admitting: Family Medicine

## 2014-04-26 NOTE — Telephone Encounter (Signed)
Med refilled.

## 2014-05-01 ENCOUNTER — Other Ambulatory Visit (HOSPITAL_COMMUNITY): Payer: Self-pay | Admitting: Psychiatry

## 2014-05-01 ENCOUNTER — Ambulatory Visit (INDEPENDENT_AMBULATORY_CARE_PROVIDER_SITE_OTHER): Payer: Medicare Other | Admitting: Psychology

## 2014-05-01 ENCOUNTER — Telehealth: Payer: Self-pay

## 2014-05-01 ENCOUNTER — Encounter (HOSPITAL_COMMUNITY): Payer: Self-pay | Admitting: Psychology

## 2014-05-01 DIAGNOSIS — F333 Major depressive disorder, recurrent, severe with psychotic symptoms: Secondary | ICD-10-CM | POA: Diagnosis not present

## 2014-05-01 DIAGNOSIS — F4001 Agoraphobia with panic disorder: Secondary | ICD-10-CM

## 2014-05-01 DIAGNOSIS — I10 Essential (primary) hypertension: Secondary | ICD-10-CM | POA: Diagnosis not present

## 2014-05-01 DIAGNOSIS — E109 Type 1 diabetes mellitus without complications: Secondary | ICD-10-CM | POA: Diagnosis not present

## 2014-05-01 DIAGNOSIS — E1065 Type 1 diabetes mellitus with hyperglycemia: Secondary | ICD-10-CM | POA: Diagnosis not present

## 2014-05-01 DIAGNOSIS — E039 Hypothyroidism, unspecified: Secondary | ICD-10-CM | POA: Diagnosis not present

## 2014-05-01 MED ORDER — PREDNISONE (PAK) 5 MG PO TABS: 5.0000 mg | ORAL_TABLET | ORAL | Status: DC

## 2014-05-01 MED ORDER — AZITHROMYCIN 250 MG PO TABS: ORAL_TABLET | ORAL | Status: DC

## 2014-05-01 MED ORDER — BREXPIPRAZOLE 4 MG PO TABS: 4.0000 mg | ORAL_TABLET | Freq: Every day | ORAL | Status: DC

## 2014-05-01 NOTE — Telephone Encounter (Signed)
pls send z pack , and prednisone dose pack, both are  sent

## 2014-05-01 NOTE — Progress Notes (Signed)
    PROGRESS NOTE  Patient:  [name]   DOB: 02-26-1951  MR Number: [id number]  Location: [facility] [address] [place] [address] Dept: [phone]  Start: 4 PM End: 5 PM   Provider/Observer:     Edgardo Roys PSYD  Chief Complaint:      Chief Complaint  Patient presents with  . Depression  . Anxiety    Reason For Service:     The patient was referred for psychotherapeutic interventions because of ongoing symptoms of depression, anxiety, and mood swings including anger outbursts particularly of a verbal nature.  Interventions Strategy:  Cognitive/behavioral psychotherapeutic interventions  Participation Level:   Active  Participation Quality:  Appropriate      Behavioral Observation:  Well Groomed, Alert, and Appropriate.   Current Psychosocial Factors: The patient reports that she has been getting out, but more breathing issues now limiting.  Content of Session:   Review current symptoms and continued work on therapeutic interventions for issues of recurrent depression and panic attacks.  Current Status:   The patient reports that her depression has been better with medications changes but the patient is having more issues with oxygen levels.  She is using o2 but still having issues.  Patient Progress:   Very good considering the situation.  Target Goals:   Target goals have to do with reducing the intensity, duration, and frequency of depressive events as well as reducing the intensity, duration, and frequency of panic attacks. Target goals include building coping skills and strategies around both of these diagnostic issues.  Last Reviewed:   11/30/20015  Goals Addressed Today:    Specific goals addressed today had to do with managing and coping with her cognitive and behavioral responses to stressors particularly around issues that may lead to full-blown panic  attacks when she is in medical situations. These have been particular triggers for her in the past for panic attacks due to her medical illnesses and past medical treatments.  Impression/Diagnosis:   The patient has had a lot of significant medical issues to deal with particularly regarding pulmonary function. However, she also has a long history of depression and panic attacks have become exacerbated due to to persistent and serious medical issues.  Diagnosis:    Axis I:  Severe recurrent major depressive disorder with psychotic features  Panic disorder with agoraphobia and moderate panic attacks      Axis II: No diagnosis     Jawuan Robb R, PsyD 05/01/2014

## 2014-05-02 LAB — COMPLETE METABOLIC PANEL WITH GFR
ALBUMIN: 4.1 g/dL (ref 3.5–5.2)
ALK PHOS: 81 U/L (ref 39–117)
ALT: 9 U/L (ref 0–35)
AST: 15 U/L (ref 0–37)
BUN: 20 mg/dL (ref 6–23)
CALCIUM: 9.6 mg/dL (ref 8.4–10.5)
CHLORIDE: 101 meq/L (ref 96–112)
CO2: 30 mEq/L (ref 19–32)
Creat: 1.13 mg/dL — ABNORMAL HIGH (ref 0.50–1.10)
GFR, Est African American: 60 mL/min
GFR, Est Non African American: 52 mL/min — ABNORMAL LOW
GLUCOSE: 95 mg/dL (ref 70–99)
Potassium: 4.3 mEq/L (ref 3.5–5.3)
SODIUM: 143 meq/L (ref 135–145)
Total Bilirubin: 0.5 mg/dL (ref 0.2–1.2)
Total Protein: 6.8 g/dL (ref 6.0–8.3)

## 2014-05-02 LAB — LIPID PANEL
CHOLESTEROL: 116 mg/dL (ref 0–200)
HDL: 42 mg/dL (ref >39)
LDL CALC: 53 mg/dL (ref 0–99)
TRIGLYCERIDES: 103 mg/dL (ref <150)
Total CHOL/HDL Ratio: 2.8 Ratio
VLDL: 21 mg/dL (ref 0–40)

## 2014-05-02 LAB — HEMOGLOBIN A1C
Hgb A1c MFr Bld: 7 % — ABNORMAL HIGH (ref <5.7)
Mean Plasma Glucose: 154 mg/dL — ABNORMAL HIGH (ref <117)

## 2014-05-02 LAB — TSH: TSH: 0.504 u[IU]/mL (ref 0.350–4.500)

## 2014-05-03 ENCOUNTER — Ambulatory Visit (INDEPENDENT_AMBULATORY_CARE_PROVIDER_SITE_OTHER): Payer: Medicare Other | Admitting: Psychiatry

## 2014-05-03 ENCOUNTER — Encounter (HOSPITAL_COMMUNITY): Payer: Self-pay | Admitting: Psychiatry

## 2014-05-03 ENCOUNTER — Encounter: Payer: Self-pay | Admitting: Family Medicine

## 2014-05-03 VITALS — BP 116/65 | HR 60 | Ht 64.0 in | Wt 216.6 lb

## 2014-05-03 DIAGNOSIS — F332 Major depressive disorder, recurrent severe without psychotic features: Secondary | ICD-10-CM | POA: Diagnosis not present

## 2014-05-03 DIAGNOSIS — F333 Major depressive disorder, recurrent, severe with psychotic symptoms: Secondary | ICD-10-CM

## 2014-05-03 MED ORDER — FLUOXETINE HCL 20 MG PO CAPS: ORAL_CAPSULE | ORAL | Status: DC

## 2014-05-03 NOTE — Progress Notes (Signed)
Patient ID: [name], female   DOB: Oct 09, 1950, 63 y.o.   MRN: [id number]  Psychiatric Assessment Adult  Patient Identification:  [name] Date of Evaluation:  05/03/2014 Chief Complaint: depression History of Chief Complaint:   Chief Complaint  Patient presents with  . Depression  . Hallucinations  . Anxiety  . Follow-up    Anxiety Symptoms include nervous/anxious behavior and shortness of breath.    this patient is a 63 year old widowed black female who lives alone in [place]. She has no children. She is on disability.  The patient was referred by Dr. Moshe Cipro her primary care physician and Dr. Jefm Miles her psychologist for further evaluation and treatment of major depression and anxiety.  The patient states that she's been depressed since her teen years. She's not really sure why. When she was in college she went through severe depression and had to quit. She's had this on and off through the years but really not receive treatment until a couple years ago after her husband died. Her primary care physician has put her on Prozac and Xanax which have helped to some degree.however recently she has been getting more depressed. She has severe COPD and is on oxygen. Yet, she continues to smoke THREE PACKS PER DAY which she claims is secondary to her depressed mood.  Recently she's gone through more deaths including her sister in 2014 a niece in 2014. Another sister was recently diagnosed with cancer. The patient has gotten overwhelmed by all this and claims it's getting hard for her to just get out of bed and function. She has no energy, she's been shaky and having tremors in her hands, she complains of short-term memory loss.her sleep is variable and Ambien sometimes helps. Her appetite is up and down. Sometimes she hears the voices of her deceased relatives or "feels her spirits." She's never been to a psychiatric hospitalization  But has been seeing Dr. Jefm Miles in our  office for several years. She has never been suicidal and states she is very "spiritual and values life. She stays away from people but she has a god niece and  God sister who check on her faithfully.she denies any other psychotic symptoms and does not use drugs or alcohol  The patient returns after 4 weeks. Last time we had increased her Prozac to 60 mg daily and also added Rexulti. She mistakenly doubled the dose and is up to 4 mg daily. However she feels really good on this dose. Her mood and energy are improved and she's no longer is sad. Her energy is better and she is planning some outings with her family. She recently had a flareup of her COPD and she has cut back on her cigarettes considerably. She denies suicidal ideation and denies hallucinations Review of Systems  Constitutional: Positive for activity change, appetite change and fatigue.  HENT: Negative.   Eyes: Negative.   Respiratory: Positive for shortness of breath.   Cardiovascular: Negative.   Gastrointestinal: Negative.   Endocrine: Negative.   Genitourinary: Negative.   Musculoskeletal: Positive for arthralgias.  Skin: Negative.   Allergic/Immunologic: Negative.   Neurological: Positive for tremors.  Hematological: Negative.   Psychiatric/Behavioral: Positive for hallucinations, sleep disturbance and dysphoric mood. The patient is nervous/anxious.    Physical Examnot done  Depressive Symptoms: depressed mood, anhedonia, psychomotor retardation, difficulty concentrating, impaired memory, anxiety, disturbed sleep, decreased appetite,  (Hypo) Manic Symptoms:   Elevated Mood:  No Irritable Mood:  No Grandiosity:  No Distractibility:  No Labiality  of Mood:  No Delusions:  No Hallucinations:  Yes Impulsivity:  Yes Sexually Inappropriate Behavior:  No Financial Extravagance:  No Flight of Ideas:  No  Anxiety Symptoms: Excessive Worry:  Yes Panic Symptoms:  No Agoraphobia:  No Obsessive Compulsive:  No  Symptoms: None, Specific Phobias:  No Social Anxiety:  No  Psychotic Symptoms:  Hallucinations: Yes Auditory Delusions:  No Paranoia:  No   Ideas of Reference:  No  PTSD Symptoms: Ever had a traumatic exposure:  No Had a traumatic exposure in the last month:  No Re-experiencing: No None Hypervigilance:  No Hyperarousal: No None Avoidance: No None  Traumatic Brain Injury: No   Past Psychiatric History: Diagnosis:Maj. depression  Hospitalizations: none  Outpatient Care: has been seeing Dr. Jefm Miles in our office for counseling  Substance Abuse Care: none  Self-Mutilation: none  Suicidal Attempts:none  Violent Behaviors: none   Past Medical History:   Past Medical History  Diagnosis Date  . DJD (degenerative joint disease)     of the spine   . GERD (gastroesophageal reflux disease)   . Insomnia   . COPD (chronic obstructive pulmonary disease)   . Obesity   . Anxiety   . Hypothyroidism 2004    left thyroidectomy  . Diabetes mellitus, type 1 1983    type 2 diabetes  . Hypertension 1983  . Hyperlipidemia 2007  . Depression 2008   History of Loss of Consciousness:  No Seizure History:  No Cardiac History:  No Allergies:   Allergies  Allergen Reactions  . Codeine     REACTION: nausea   Current Medications:  Current Outpatient Prescriptions  Medication Sig Dispense Refill  . ADVAIR DISKUS 250-50 MCG/DOSE AEPB inhale 1 dose by mouth twice a day 60 each 4  . ALPRAZolam (XANAX) 1 MG tablet TAKE ONE TABLET FOUR TIMES A DAY 120 tablet 2  . aspirin (ASPIRIN LOW DOSE) 81 MG EC tablet Take 81 mg by mouth daily.      Marland Kitchen azithromycin (ZITHROMAX) 250 MG tablet As directed 6 tablet 0  . B-D INS SYR ULTRAFINE 1CC/31G 31G X 5/16" 1 ML MISC use as directed FOR ONCE DAILY TESTING 100 each 5  . Brexpiprazole (REXULTI) 4 MG TABS Take 4 mg by mouth daily. 30 tablet 2  . calcium carbonate (TUMS) 500 MG chewable tablet Chew 1 tablet by mouth as needed.      . cloNIDine  (CATAPRES) 0.1 MG tablet Take 0.1 mg by mouth daily.    . CRESTOR 20 MG tablet TAKE ONE TABLET BY MOUTH ONCE DAILY 30 tablet 4  . esomeprazole (NEXIUM) 40 MG capsule take 1 capsule by mouth once daily BEFORE BREAKFAST 30 capsule 4  . FLUoxetine (PROZAC) 20 MG capsule Take two in the am and one in the evening 90 capsule 2  . Foot Care Products (DIABETIC INSOLES) MISC by Does not apply route. And Shoes    . furosemide (LASIX) 40 MG tablet take 1 tablet once daily 30 tablet 4  . GLIPIZIDE XL 2.5 MG 24 hr tablet TAKE ONE TABLET BY MOUTH ONCE DAILY 30 tablet 4  . Glucose Blood (BAYER BREEZE 2 TEST) DISK by In Vitro route. Lancets and strips for four times a day testing     . guaiFENesin 100 MG/5ML SOLN Take 100 mg by mouth 4 (four) times daily. 15 ml by mouth 4 times a day     . HYDROcodone-acetaminophen (NORCO) 10-325 MG per tablet Take 1 tablet by mouth every 6 (six)  hours as needed. 120 tablet 0  . insulin glargine (LANTUS) 100 UNIT/ML injection 50 units once daily (Patient taking differently: 50 units once daily prn) 30 mL 3  . ipratropium-albuterol (DUONEB) 0.5-2.5 (3) MG/3ML SOLN Take 3 mLs by nebulization 4 (four) times daily.      Marland Kitchen levalbuterol (XOPENEX HFA) 45 MCG/ACT inhaler Inhale 1-2 puffs into the lungs as needed.      Marland Kitchen levothyroxine (SYNTHROID, LEVOTHROID) 100 MCG tablet take 1 tablet by mouth once daily 30 tablet 2  . lisinopril-hydrochlorothiazide (PRINZIDE,ZESTORETIC) 20-12.5 MG per tablet take 1 tablet by mouth once daily 30 tablet 4  . meloxicam (MOBIC) 15 MG tablet take 1 tablet by mouth once daily 30 tablet 3  . montelukast (SINGULAIR) 10 MG tablet TAKE ONE TABLET BY MOUTH ONCE DAILY AT BEDTIME 30 tablet 4  . potassium chloride 20 MEQ/15ML (10%) solution TAKE 1 AND 1/2 TEASPOONFUL BY MOUTH ONCE DAILY 240 mL 2  . predniSONE (STERAPRED UNI-PAK) 5 MG TABS tablet Take 1 tablet (5 mg total) by mouth as directed. Use as directed 21 tablet 0  . tiZANidine (ZANAFLEX) 4 MG capsule Take 1  capsule (4 mg total) by mouth 3 (three) times daily. 90 capsule 4  . zolpidem (AMBIEN) 10 MG tablet take 1 tablet by mouth at bedtime for sleep 30 tablet 4   No current facility-administered medications for this visit.    Previous Psychotropic Medications:  Medication Dose   Wellbutrin-caused irritability                       Substance Abuse History in the last 12 months: Substance Age of 1st Use Last Use Amount Specific Type  Nicotine   Smokes 3 packs of cigarettes per day   Alcohol      Cannabis      Opiates      Cocaine      Methamphetamines      LSD      Ecstasy      Benzodiazepines      Caffeine      Inhalants      Others:                          Medical Consequences of Substance Abuse: smoking is making her COPD much worse  Legal Consequences of Substance Abuse: none  Family Consequences of Substance Abuse: none  Blackouts:  No DT's:  No Withdrawal Symptoms:  No None  Social History: Current Place of Residence: [place] of Birth: [place] Family Members: 2 sisters Marital Status:  Widowed Children: none   Relationships:  Education:  Dentist Problems/Performance:  Religious Beliefs/Practices: Christian History of Abuse: none Energy manager History:  None. Legal History: none Hobbies/Interests: listening to gospel music  Family History:   Family History  Problem Relation Age of Onset  . Hypertension Mother   . Stroke Mother   . Cancer Father     lung   . Kidney failure      family history   . Hypertension      family history   . Hypertension Sister   . Depression Sister   . Alcohol abuse Sister   . Hypertension Sister   . Depression Sister   . Diabetes Sister   . Depression Sister     Mental Status Examination/Evaluation: Objective:  Appearance: Casual and Fairly Groomed  using portable oxygen  Eye Contact::  Good  Speech:  Clear and Coherent  Volume:   Decreased  Mood:  Less depressed, much improved   Affect:  Brighter   Thought Process:  Circumstantial  Orientation:  Full (Time, Place, and Person)  Thought Content:  Rumination with auditory hallucinations at times of deceased relatives  Suicidal Thoughts:  No  Homicidal Thoughts:  No  Judgement:  Fair  Insight:  Fair  Psychomotor Activity:  Decreased  Akathisia:  No  Handed:  Right  AIMS (if indicated):    Assets:  Communication Skills Desire for Improvement Social Support    Laboratory/X-Ray Psychological Evaluation(s)   reviewed in chart     Assessment:  Axis I: Major Depression, Recurrent severe  AXIS I Major Depression, Recurrent severe  AXIS II Deferred  AXIS III Past Medical History  Diagnosis Date  . DJD (degenerative joint disease)     of the spine   . GERD (gastroesophageal reflux disease)   . Insomnia   . COPD (chronic obstructive pulmonary disease)   . Obesity   . Anxiety   . Hypothyroidism 2004    left thyroidectomy  . Diabetes mellitus, type 1 1983    type 2 diabetes  . Hypertension 1983  . Hyperlipidemia 2007  . Depression 2008     AXIS IV problems related to social environment  AXIS V 41-50 serious symptoms   Treatment Plan/Recommendations:  Plan of Care: medication management  Laboratory:   Psychotherapy: she is seeing Dr. Jefm Miles here  Medications: she'll continue Prozac 60 mg daily and Rexulti 4 mg daily   Routine PRN Medications:  No  Consultations:   Safety Concerns:  She denies thoughts or plans of self-harm  Other:she'll return in 2 months     Levonne Spiller, MD 12/2/20152:20 PM

## 2014-05-05 ENCOUNTER — Telehealth: Payer: Self-pay | Admitting: Family Medicine

## 2014-05-05 NOTE — Telephone Encounter (Signed)
See next message

## 2014-05-08 ENCOUNTER — Telehealth: Payer: Self-pay

## 2014-05-08 ENCOUNTER — Telehealth: Payer: Self-pay | Admitting: Family Medicine

## 2014-05-08 NOTE — Telephone Encounter (Signed)
Patient given appointment to see eye dr.     Please advise if patient can have additional z pak.

## 2014-05-08 NOTE — Telephone Encounter (Signed)
Called pt no answer °

## 2014-05-08 NOTE — Telephone Encounter (Signed)
See next telephone msg

## 2014-05-08 NOTE — Telephone Encounter (Signed)
Needs sputum c/s and CXR, should be able to see Dr Luan Pulling her pulmonologist this week also

## 2014-05-08 NOTE — Telephone Encounter (Signed)
Phone ringing busy.  Called x 2

## 2014-05-09 ENCOUNTER — Encounter: Payer: Self-pay | Admitting: Family Medicine

## 2014-05-09 NOTE — Telephone Encounter (Signed)
Patient aware and states that she will contact Dr. Luan Pulling office.

## 2014-05-09 NOTE — Telephone Encounter (Signed)
Mailed letter to patient about this appointment.

## 2014-05-10 ENCOUNTER — Telehealth (HOSPITAL_COMMUNITY): Payer: Self-pay | Admitting: *Deleted

## 2014-05-10 NOTE — Telephone Encounter (Signed)
called and lmtcb to resch appt. asked pt Great neice Jodelle Gross and sister Lelon Frohlich to have pt call office. number provided

## 2014-05-10 NOTE — Telephone Encounter (Signed)
Pt calling stating she is unable to tolerate Rexulti 4 mg once a day. Per pt, it is making her shaky and she do not have the energy to do anything. Per pt she thinks the 2 mg was doing good for her. Pt stated she have not had her Rexulti or her Prozac this morning and would like Dr. Harrington Challenger to call her to discuss this before she take these medications.Pt number [phone].

## 2014-05-10 NOTE — Telephone Encounter (Signed)
Pt told to continue Prozac and just take 2 mg of rexulti

## 2014-05-15 NOTE — Telephone Encounter (Signed)
Pt is aware and shows understanding 

## 2014-05-19 ENCOUNTER — Other Ambulatory Visit: Payer: Self-pay

## 2014-05-19 MED ORDER — HYDROCODONE-ACETAMINOPHEN 10-325 MG PO TABS: 1.0000 | ORAL_TABLET | Freq: Four times a day (QID) | ORAL | Status: DC | PRN

## 2014-05-29 ENCOUNTER — Other Ambulatory Visit: Payer: Self-pay | Admitting: Family Medicine

## 2014-05-31 ENCOUNTER — Ambulatory Visit (HOSPITAL_COMMUNITY): Payer: Self-pay | Admitting: Psychology

## 2014-06-14 ENCOUNTER — Other Ambulatory Visit: Payer: Self-pay

## 2014-06-14 ENCOUNTER — Ambulatory Visit (INDEPENDENT_AMBULATORY_CARE_PROVIDER_SITE_OTHER): Payer: Medicare Other | Admitting: Psychology

## 2014-06-14 ENCOUNTER — Telehealth: Payer: Self-pay

## 2014-06-14 ENCOUNTER — Encounter (HOSPITAL_COMMUNITY): Payer: Self-pay | Admitting: Psychology

## 2014-06-14 DIAGNOSIS — F4001 Agoraphobia with panic disorder: Secondary | ICD-10-CM

## 2014-06-14 DIAGNOSIS — F333 Major depressive disorder, recurrent, severe with psychotic symptoms: Secondary | ICD-10-CM

## 2014-06-14 MED ORDER — CEPHALEXIN 500 MG PO CAPS: 500.0000 mg | ORAL_CAPSULE | Freq: Two times a day (BID) | ORAL | Status: DC

## 2014-06-14 NOTE — Telephone Encounter (Signed)
pls send keflex 500mg  one twice daily for 10 days and let her know

## 2014-06-14 NOTE — Progress Notes (Signed)
     PROGRESS NOTE  Patient:  [name]   DOB: 1950-10-18  MR Number: [id number]  Location: [facility] [address] [place] [address] Dept: [phone]  Start: 4 PM End: 5 PM   Provider/Observer:     Edgardo Roys PSYD  Chief Complaint:      Chief Complaint  Patient presents with  . Anxiety  . Agitation  . Panic Attack  . Depression    Reason For Service:     The patient was referred for psychotherapeutic interventions because of ongoing symptoms of depression, anxiety, and mood swings including anger outbursts particularly of a verbal nature.  Interventions Strategy:  Cognitive/behavioral psychotherapeutic interventions  Participation Level:   Active  Participation Quality:  Appropriate      Behavioral Observation:  Well Groomed, Alert, and Appropriate.   Current Psychosocial Factors: The patient reports that she has started doing much worse.  She was getting more panic, fear,   Content of Session:   Review current symptoms and continued work on therapeutic interventions for issues of recurrent depression and panic attacks.  Current Status:   The patient reports that she started doing very poorly with Rexulti.  She feels it started causing sever mussel movement, drooling uncontrollably, drifting in and out of conscious, blood sugar levels getting out of control, and significantly increased levels of panic attacks.  She reports that she stopped this medication on her own and feels these symptoms have improved a great deal.  Patient Progress:   The patient reports that she had started doing much worse.  She felt it was related to side effects of Rexulti and she stopped that medication on her own.  Target Goals:   Target goals have to do with reducing the intensity, duration, and frequency of depressive events as well as reducing the intensity, duration, and frequency of  panic attacks. Target goals include building coping skills and strategies around both of these diagnostic issues.  Last Reviewed:   06/14/2014  Goals Addressed Today:    Specific goals addressed today had to do with managing and coping with her cognitive and behavioral responses to stressors particularly around issues that may lead to full-blown panic attacks when she is in medical situations. These have been particular triggers for her in the past for panic attacks due to her medical illnesses and past medical treatments.  Impression/Diagnosis:   The patient has had a lot of significant medical issues to deal with particularly regarding pulmonary function. However, she also has a long history of depression and panic attacks have become exacerbated due to to persistent and serious medical issues.  Diagnosis:    Axis I:  No diagnosis found.      Axis II: No diagnosis     RODENBOUGH,JOHN R, PsyD 06/14/2014

## 2014-06-14 NOTE — Telephone Encounter (Signed)
States for the past week she has been having a bad toothache in her lower molar. Rates pain at a 10 and its puffy around the gum. States she cannot afford to go to the dentist until next month to have it pulled. Advised to use orajel for the pan and I would see if an antibiotic could be sent to [facility]. Please advise

## 2014-06-14 NOTE — Telephone Encounter (Signed)
Pt aware.

## 2014-06-19 ENCOUNTER — Encounter: Payer: Self-pay | Admitting: Family Medicine

## 2014-06-19 ENCOUNTER — Encounter: Payer: Medicare Other | Admitting: Family Medicine

## 2014-06-22 ENCOUNTER — Other Ambulatory Visit: Payer: Self-pay

## 2014-06-22 MED ORDER — HYDROCODONE-ACETAMINOPHEN 10-325 MG PO TABS: 1.0000 | ORAL_TABLET | Freq: Four times a day (QID) | ORAL | Status: DC | PRN

## 2014-06-27 ENCOUNTER — Other Ambulatory Visit: Payer: Self-pay | Admitting: Family Medicine

## 2014-07-04 ENCOUNTER — Ambulatory Visit (INDEPENDENT_AMBULATORY_CARE_PROVIDER_SITE_OTHER): Payer: Medicare Other | Admitting: Psychiatry

## 2014-07-04 ENCOUNTER — Ambulatory Visit (INDEPENDENT_AMBULATORY_CARE_PROVIDER_SITE_OTHER): Payer: Medicare Other | Admitting: Family Medicine

## 2014-07-04 ENCOUNTER — Encounter: Payer: Self-pay | Admitting: Family Medicine

## 2014-07-04 ENCOUNTER — Encounter (HOSPITAL_COMMUNITY): Payer: Self-pay | Admitting: Psychiatry

## 2014-07-04 VITALS — BP 113/67 | HR 82 | Ht 64.0 in | Wt 206.2 lb

## 2014-07-04 VITALS — BP 126/76 | HR 66 | Resp 18 | Ht 64.0 in | Wt 206.0 lb

## 2014-07-04 DIAGNOSIS — K219 Gastro-esophageal reflux disease without esophagitis: Secondary | ICD-10-CM | POA: Diagnosis not present

## 2014-07-04 DIAGNOSIS — Z794 Long term (current) use of insulin: Principal | ICD-10-CM

## 2014-07-04 DIAGNOSIS — I10 Essential (primary) hypertension: Secondary | ICD-10-CM | POA: Diagnosis not present

## 2014-07-04 DIAGNOSIS — E1165 Type 2 diabetes mellitus with hyperglycemia: Secondary | ICD-10-CM | POA: Diagnosis not present

## 2014-07-04 DIAGNOSIS — F418 Other specified anxiety disorders: Secondary | ICD-10-CM

## 2014-07-04 DIAGNOSIS — F17210 Nicotine dependence, cigarettes, uncomplicated: Secondary | ICD-10-CM

## 2014-07-04 DIAGNOSIS — E039 Hypothyroidism, unspecified: Secondary | ICD-10-CM

## 2014-07-04 DIAGNOSIS — F419 Anxiety disorder, unspecified: Secondary | ICD-10-CM

## 2014-07-04 DIAGNOSIS — IMO0001 Reserved for inherently not codable concepts without codable children: Secondary | ICD-10-CM

## 2014-07-04 DIAGNOSIS — Z23 Encounter for immunization: Secondary | ICD-10-CM

## 2014-07-04 DIAGNOSIS — F17200 Nicotine dependence, unspecified, uncomplicated: Secondary | ICD-10-CM

## 2014-07-04 DIAGNOSIS — E785 Hyperlipidemia, unspecified: Secondary | ICD-10-CM | POA: Diagnosis not present

## 2014-07-04 DIAGNOSIS — F333 Major depressive disorder, recurrent, severe with psychotic symptoms: Secondary | ICD-10-CM | POA: Diagnosis not present

## 2014-07-04 DIAGNOSIS — J9611 Chronic respiratory failure with hypoxia: Secondary | ICD-10-CM

## 2014-07-04 DIAGNOSIS — F329 Major depressive disorder, single episode, unspecified: Secondary | ICD-10-CM

## 2014-07-04 DIAGNOSIS — M4720 Other spondylosis with radiculopathy, site unspecified: Secondary | ICD-10-CM | POA: Diagnosis not present

## 2014-07-04 MED ORDER — FLUOXETINE HCL 20 MG PO CAPS: ORAL_CAPSULE | ORAL | Status: DC

## 2014-07-04 NOTE — Patient Instructions (Addendum)
Annual wellness in 4 month, call if you need me before   Fasting lipid, cmp and EGFR, hBA1C, TSH end  March    Pls return 3 stool cards for colon screen  Pls schedule eye exam  Pls work on stopping smoking  Thankful you are  Feeling better  Letter permitting family members to stay will be provided

## 2014-07-04 NOTE — Assessment & Plan Note (Signed)
Currently stable on supplemental oxygen, no wheezing or respiratory distress

## 2014-07-04 NOTE — Assessment & Plan Note (Signed)
Unchanged Uses walker for safe ambulation Chronic pain management as before

## 2014-07-04 NOTE — Assessment & Plan Note (Signed)
Markedly improved, currently treated by psycxhiatry, still seeing therapist

## 2014-07-04 NOTE — Assessment & Plan Note (Signed)
Controlled, no change in medication  

## 2014-07-04 NOTE — Assessment & Plan Note (Signed)
Marked improvement Patient advised to reduce carb and sweets, commit to regular physical activity, take meds as prescribed, test blood as directed, and attempt to lose weight, to improve blood sugar control. Updated lab needed at/ before next visit.

## 2014-07-04 NOTE — Assessment & Plan Note (Signed)
Hyperlipidemia:Low fat diet discussed and encouraged.  Updated lab needed at/ before next visit.;l

## 2014-07-04 NOTE — Assessment & Plan Note (Signed)
Unchanged. Patient counseled for approximately 5 minutes regarding the health risks of ongoing nicotine use, specifically all types of cancer, heart disease, stroke and respiratory failure. The options available for help with cessation ,the behavioral changes to assist the process, and the option to either gradully reduce usage  Or abruptly stop.is also discussed. Pt is also encouraged to set specific goals in number of cigarettes used daily, as well as to set a quit date.  

## 2014-07-04 NOTE — Progress Notes (Signed)
Subjective:    Patient ID: [name], female    DOB: [birth year], 64 y.o.   MRN: [id number]  HPI The PT is here for follow up and re-evaluation of chronic medical conditions, medication management and review of any available recent lab and radiology data.  Preventive health is updated, specifically  Cancer screening and Immunization.   Much improved now that she is being treated by psychiatry as far as her mental health is concerned The PT denies any adverse reactions to current medications since the last visit.  She is accompamied by a niece at this visit. Requests that a letter be written on her behalf for the apt complex where she lives that on medical grds she be permitted to have family members stay overnight on occassion in the  Event that she is acutely ill, which is very reasonable , she does have multiple serious health conditions       Review of Systems See HPI Denies recent fever or chills. Denies sinus pressure, nasal congestion, ear pain or sore throat. Denies chest congestion, productive cough, does have intermittent wheezing. Denies chest pains, palpitations, PND, orthopnea and leg swelling Denies abdominal pain, nausea, vomiting,diarrhea or constipation.   Denies dysuria, frequency, hesitancy or incontinence. C/o chronic joint pain, swelling and limitation in mobility. Denies headaches, seizures, numbness, or tingling. Denies uncontrolled  depression, anxiety or insomnia. Denies skin break down or rash.        Objective:   Physical Exam BP 126/76 mmHg  Pulse 66  Resp 18  Ht 5\' 4"  (1.626 m)  Wt 206 lb (93.441 kg)  BMI 35.34 kg/m2  SpO2 94% Patient alert and oriented and in no cardiopulmonary distress.Dependent on supplemental oxygen  HEENT: No facial asymmetry, EOMI,   oropharynx pink and moist.  Neck supple no JVD, no mass.  Chest: Clear to auscultation bilaterally.Decreased though adequate air entry   CVS: S1, S2 no murmurs, no S3.Regular  rate.  ABD: Soft non tender.   Ext: No edema  LM:[id number] ROM spine, shoulders, hips and knees.  Skin: Intact, no ulcerations or rash noted.  Psych: Good eye contact, normal affect. Memory mildly impaired not anxious or depressed appearing.  CNS: CN 2-12 intact, power,  normal throughout.no focal deficits noted.        Assessment & Plan:  Essential hypertension Controlled, no change in medication DASH diet and commitment to daily physical activity for a minimum of 30 minutes discussed and encouraged, as a part of hypertension management. The importance of attaining a healthy weight is also discussed.    Chronic respiratory failure with hypoxia Currently stable on supplemental oxygen, no wheezing or respiratory distress   Hypothyroidism Controlled, no change in medication    Anxiety and depression Markedly improved, currently treated by psycxhiatry, still seeing therapist   NICOTINE ADDICTION Unchanged Patient counseled for approximately 5 minutes regarding the health risks of ongoing nicotine use, specifically all types of cancer, heart disease, stroke and respiratory failure. The options available for help with cessation ,the behavioral changes to assist the process, and the option to either gradully reduce usage  Or abruptly stop.is also discussed. Pt is also encouraged to set specific goals in number of cigarettes used daily, as well as to set a quit date.    Need for prophylactic vaccination and inoculation against influenza Vaccine administered at visit.    Hyperlipidemia LDL goal <100 Hyperlipidemia:Low fat diet discussed and encouraged.  Updated lab needed at/ before next visit.;l    Osteoarthritis of  spine Unchanged Uses walker for safe ambulation Chronic pain management as before   Diabetes mellitus, insulin dependent (IDDM), uncontrolled Marked improvement Patient advised to reduce carb and sweets, commit to regular physical activity, take  meds as prescribed, test blood as directed, and attempt to lose weight, to improve blood sugar control. Updated lab needed at/ before next visit.    GERD Controlled, no change in medication

## 2014-07-04 NOTE — Assessment & Plan Note (Signed)
Vaccine administered at visit.  

## 2014-07-04 NOTE — Progress Notes (Signed)
Patient ID: [name], female   DOB: [birth year], 64 y.o.   MRN: [id number] Patient ID: [name], female   DOB: November 18, 1950, 64 y.o.   MRN: [id number]  Psychiatric Assessment Adult  Patient Identification:  [name] Date of Evaluation:  07/04/2014 Chief Complaint: depression History of Chief Complaint:   Chief Complaint  Patient presents with  . Depression  . Follow-up    Anxiety Symptoms include nervous/anxious behavior and shortness of breath.    this patient is a 64 year old widowed black female who lives alone in [place]. She has no children. She is on disability.  The patient was referred by Dr. Moshe [name] her primary care physician and Dr. Jefm [name] her psychologist for further evaluation and treatment of major depression and anxiety.  The patient states that she's been depressed since her teen years. She's not really sure why. When she was in college she went through severe depression and had to quit. She's had this on and off through the years but really not receive treatment until a couple years ago after her husband died. Her primary care physician has put her on Prozac and Xanax which have helped to some degree.however recently she has been getting more depressed. She has severe COPD and is on oxygen. Yet, she continues to smoke THREE PACKS PER DAY which she claims is secondary to her depressed mood.  Recently she's gone through more deaths including her sister in 2014 a niece in 2014. Another sister was recently diagnosed with cancer. The patient has gotten overwhelmed by all this and claims it's getting hard for her to just get out of bed and function. She has no energy, she's been shaky and having tremors in her hands, she complains of short-term memory loss.her sleep is variable and Ambien sometimes helps. Her appetite is up and down. Sometimes she hears the voices of her deceased relatives or "feels her spirits." She's never been to a psychiatric hospitalization   But has been seeing Dr. Jefm [name] in our office for several years. She has never been suicidal and states she is very "spiritual and values life. She stays away from people but she has a god niece and  God sister who check on her faithfully.she denies any other psychotic symptoms and does not use drugs or alcohol  The patient returns after 2 months. Since I last saw her she has weaned off the Rex difficulty because it was making her shake and have severe panic reactions. Since she stopped that she's been feeling better. She is also cut way back on her smoking and is steadily under a pack a day and sometimes does not smoke at all. She feels that the Prozac is helping her mood as well as the counseling here with Dr. Jefm [name]. She denies suicidal ideation Review of Systems  Constitutional: Positive for activity change, appetite change and fatigue.  HENT: Negative.   Eyes: Negative.   Respiratory: Positive for shortness of breath.   Cardiovascular: Negative.   Gastrointestinal: Negative.   Endocrine: Negative.   Genitourinary: Negative.   Musculoskeletal: Positive for arthralgias.  Skin: Negative.   Allergic/Immunologic: Negative.   Neurological: Positive for tremors.  Hematological: Negative.   Psychiatric/Behavioral: Positive for hallucinations, sleep disturbance and dysphoric mood. The patient is nervous/anxious.    Physical Examnot done  Depressive Symptoms: depressed mood, anhedonia, psychomotor retardation, difficulty concentrating, impaired memory, anxiety, disturbed sleep, decreased appetite,  (Hypo) Manic Symptoms:   Elevated Mood:  No Irritable Mood:  No Grandiosity:  No Distractibility:  No Labiality of Mood:  No Delusions:  No Hallucinations:  Yes Impulsivity:  Yes Sexually Inappropriate Behavior:  No Financial Extravagance:  No Flight of Ideas:  No  Anxiety Symptoms: Excessive Worry:  Yes Panic Symptoms:  No Agoraphobia:  No Obsessive Compulsive:  No  Symptoms: None, Specific Phobias:  No Social Anxiety:  No  Psychotic Symptoms:  Hallucinations: Yes Auditory Delusions:  No Paranoia:  No   Ideas of Reference:  No  PTSD Symptoms: Ever had a traumatic exposure:  No Had a traumatic exposure in the last month:  No Re-experiencing: No None Hypervigilance:  No Hyperarousal: No None Avoidance: No None  Traumatic Brain Injury: No   Past Psychiatric History: Diagnosis:Maj. depression  Hospitalizations: none  Outpatient Care: has been seeing Dr. Jefm [name] in our office for counseling  Substance Abuse Care: none  Self-Mutilation: none  Suicidal Attempts:none  Violent Behaviors: none   Past Medical History:   Past Medical History  Diagnosis Date  . DJD (degenerative joint disease)     of the spine   . GERD (gastroesophageal reflux disease)   . Insomnia   . COPD (chronic obstructive pulmonary disease)   . Obesity   . Anxiety   . Hypothyroidism 2004    left thyroidectomy  . Diabetes mellitus, type 1 1983    type 2 diabetes  . Hypertension 1983  . Hyperlipidemia 2007  . Depression 2008   History of Loss of Consciousness:  No Seizure History:  No Cardiac History:  No Allergies:   Allergies  Allergen Reactions  . Codeine     REACTION: nausea   Current Medications:  Current Outpatient Prescriptions  Medication Sig Dispense Refill  . ADVAIR DISKUS 250-50 MCG/DOSE AEPB inhale 1 dose by mouth twice a day 60 each 1  . ALPRAZolam (XANAX) 1 MG tablet TAKE ONE TABLET FOUR TIMES A DAY 120 tablet 2  . aspirin (ASPIRIN LOW DOSE) 81 MG EC tablet Take 81 mg by mouth daily.      . B-D INS SYR ULTRAFINE 1CC/31G 31G X 5/16" 1 ML MISC use as directed FOR ONCE DAILY TESTING 100 each 5  . calcium carbonate (TUMS) 500 MG chewable tablet Chew 1 tablet by mouth as needed.      . cloNIDine (CATAPRES) 0.1 MG tablet take 1 tablet by mouth twice a day 60 tablet 1  . CRESTOR 20 MG tablet take 1 tablet by mouth once daily 30 tablet 0  .  FLUoxetine (PROZAC) 20 MG capsule Take two in the am and one in the evening 90 capsule 2  . Foot Care Products (DIABETIC INSOLES) MISC by Does not apply route. And Shoes    . furosemide (LASIX) 40 MG tablet take 1 tablet once daily 30 tablet 4  . GLIPIZIDE XL 2.5 MG 24 hr tablet TAKE ONE TABLET ONCE A DAY 30 tablet 0  . Glucose Blood (BAYER BREEZE 2 TEST) DISK by In Vitro route. Lancets and strips for four times a day testing     . guaiFENesin 100 MG/5ML SOLN Take 100 mg by mouth 4 (four) times daily. 15 ml by mouth 4 times a day     . HYDROcodone-acetaminophen (NORCO) 10-325 MG per tablet Take 1 tablet by mouth every 6 (six) hours as needed. 120 tablet 0  . insulin glargine (LANTUS) 100 UNIT/ML injection 50 units once daily (Patient taking differently: 50 units once daily prn) 30 mL 3  . ipratropium-albuterol (DUONEB) 0.5-2.5 (3) MG/3ML SOLN Take 3 mLs by nebulization 4 (  four) times daily.      Marland Kitchen levalbuterol (XOPENEX HFA) 45 MCG/ACT inhaler Inhale 1-2 puffs into the lungs as needed.      Marland Kitchen levothyroxine (SYNTHROID, LEVOTHROID) 100 MCG tablet take 1 tablet by mouth once daily 30 tablet 1  . lisinopril-hydrochlorothiazide (PRINZIDE,ZESTORETIC) 20-12.5 MG per tablet take 1 tablet by mouth once daily 30 tablet 4  . meloxicam (MOBIC) 15 MG tablet take 1 tablet by mouth once daily 30 tablet 3  . montelukast (SINGULAIR) 10 MG tablet take 1 tablet by mouth once daily at bedtime 30 tablet 1  . NEXIUM 40 MG capsule take 1 capsule by mouth once daily BEFORE BREAKFAST 30 capsule 1  . potassium chloride 20 MEQ/15ML (10%) solution TAKE 1 AND 1/2 TEASPOONFUL BY MOUTH ONCE DAILY 240 mL 2  . tiZANidine (ZANAFLEX) 4 MG capsule Take 1 capsule (4 mg total) by mouth 3 (three) times daily. 90 capsule 4  . zolpidem (AMBIEN) 10 MG tablet take 1 tablet by mouth at bedtime for sleep 30 tablet 1   No current facility-administered medications for this visit.    Previous Psychotropic Medications:  Medication Dose    Wellbutrin-caused irritability                       Substance Abuse History in the last 12 months: Substance Age of 1st Use Last Use Amount Specific Type  Nicotine   Smokes 3 packs of cigarettes per day   Alcohol      Cannabis      Opiates      Cocaine      Methamphetamines      LSD      Ecstasy      Benzodiazepines      Caffeine      Inhalants      Others:                          Medical Consequences of Substance Abuse: smoking is making her COPD much worse  Legal Consequences of Substance Abuse: none  Family Consequences of Substance Abuse: none  Blackouts:  No DT's:  No Withdrawal Symptoms:  No None  Social History: Current Place of Residence: [place] of Birth: [place] Family Members: 2 sisters Marital Status:  Widowed Children: none   Relationships:  Education:  Dentist Problems/Performance:  Religious Beliefs/Practices: Christian History of Abuse: none Energy manager History:  None. Legal History: none Hobbies/Interests: listening to gospel music  Family History:   Family History  Problem Relation Age of Onset  . Hypertension Mother   . Stroke Mother   . Cancer Father     lung   . Kidney failure      family history   . Hypertension      family history   . Hypertension Sister   . Depression Sister   . Alcohol abuse Sister   . Hypertension Sister   . Depression Sister   . Diabetes Sister   . Depression Sister     Mental Status Examination/Evaluation: Objective:  Appearance: Casual and Fairly Groomed  using portable oxygen  Eye Contact::  Good  Speech:  Clear and Coherent  Volume:  Decreased  Mood:good  Affect:  Brighter   Thought Process:  Circumstantial  Orientation:  Full (Time, Place, and Person)  Thought Content:  Rumination   Suicidal Thoughts:  No  Homicidal Thoughts:  No  Judgement:  Fair  Insight:  Fair  Psychomotor Activity:  Decreased   Akathisia:  No  Handed:  Right  AIMS (if indicated):    Assets:  Communication Skills Desire for Improvement Social Support    Laboratory/X-Ray Psychological Evaluation(s)   reviewed in chart     Assessment:  Axis I: Major Depression, Recurrent severe  AXIS I Major Depression, Recurrent severe  AXIS II Deferred  AXIS III Past Medical History  Diagnosis Date  . DJD (degenerative joint disease)     of the spine   . GERD (gastroesophageal reflux disease)   . Insomnia   . COPD (chronic obstructive pulmonary disease)   . Obesity   . Anxiety   . Hypothyroidism 2004    left thyroidectomy  . Diabetes mellitus, type 1 1983    type 2 diabetes  . Hypertension 1983  . Hyperlipidemia 2007  . Depression 2008     AXIS IV problems related to social environment  AXIS V 41-50 serious symptoms   Treatment Plan/Recommendations:  Plan of Care: medication management  Laboratory:   Psychotherapy: she is seeing Dr. Jefm [name] here  Medications: she'll continue Prozac 60 mg daily for depression   Routine PRN Medications:  No  Consultations:   Safety Concerns:  She denies thoughts or plans of self-harm  Other:she'll return in 3 months     Levonne Spiller, MD 2/2/20162:32 PM

## 2014-07-04 NOTE — Assessment & Plan Note (Signed)
Controlled, no change in medication DASH diet and commitment to daily physical activity for a minimum of 30 minutes discussed and encouraged, as a part of hypertension management. The importance of attaining a healthy weight is also discussed.  

## 2014-07-05 ENCOUNTER — Ambulatory Visit (INDEPENDENT_AMBULATORY_CARE_PROVIDER_SITE_OTHER): Payer: Medicare Other | Admitting: Psychology

## 2014-07-05 DIAGNOSIS — F333 Major depressive disorder, recurrent, severe with psychotic symptoms: Secondary | ICD-10-CM | POA: Diagnosis not present

## 2014-07-05 DIAGNOSIS — F4001 Agoraphobia with panic disorder: Secondary | ICD-10-CM | POA: Diagnosis not present

## 2014-07-17 ENCOUNTER — Ambulatory Visit (HOSPITAL_COMMUNITY): Payer: Self-pay | Admitting: Psychology

## 2014-07-27 ENCOUNTER — Other Ambulatory Visit: Payer: Self-pay

## 2014-07-27 MED ORDER — HYDROCODONE-ACETAMINOPHEN 10-325 MG PO TABS: 1.0000 | ORAL_TABLET | Freq: Four times a day (QID) | ORAL | Status: DC | PRN

## 2014-08-03 ENCOUNTER — Ambulatory Visit (INDEPENDENT_AMBULATORY_CARE_PROVIDER_SITE_OTHER): Payer: Medicare Other | Admitting: Psychology

## 2014-08-03 ENCOUNTER — Encounter (HOSPITAL_COMMUNITY): Payer: Self-pay | Admitting: Psychology

## 2014-08-03 DIAGNOSIS — F333 Major depressive disorder, recurrent, severe with psychotic symptoms: Secondary | ICD-10-CM

## 2014-08-03 DIAGNOSIS — F4001 Agoraphobia with panic disorder: Secondary | ICD-10-CM | POA: Diagnosis not present
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
## 2014-08-04 ENCOUNTER — Other Ambulatory Visit: Payer: Self-pay | Admitting: Family Medicine

## 2014-09-01 ENCOUNTER — Other Ambulatory Visit: Payer: Self-pay | Admitting: Family Medicine

## 2014-09-03 NOTE — Assessment & Plan Note (Signed)

## 2014-09-03 NOTE — Assessment & Plan Note (Signed)
Controlled, no change in medication  

## 2014-09-03 NOTE — Assessment & Plan Note (Signed)
Controlled, no change in medication Patient educated about the importance of limiting  Carbohydrate intake , the need to commit to daily physical activity for a minimum of 30 minutes , and to commit weight loss. The fact that changes in all these areas will reduce or eliminate all together the development of diabetes is stressed.   Diabetic Labs Latest Ref Rng 05/01/2014 12/14/2013 08/09/2013 04/05/2013 01/05/2013  HbA1c <5.7 % 7.0(H) 7.6(H) 8.1(H) 7.7(H) -  Microalbumin 0.00 - 1.89 mg/dL - 0.59 - - -  Micro/Creat Ratio 0.0 - 30.0 mg/g - 2.8 - - -  Chol 0 - 200 mg/dL 116 127 - 126 -  HDL >39 mg/dL 42 50 - 38(L) -  Calc LDL 0 - 99 mg/dL 53 65 - 70 -  Triglycerides <150 mg/dL 103 60 - 92 -  Creatinine 0.50 - 1.10 mg/dL 1.13(H) 1.21(H) 0.93 1.12(H) 1.24(H)   BP/Weight 07/04/2014 03/09/2014 12/15/2013 08/10/2013 04/07/2013 12/29/2012 9/62/8366  Systolic BP 294 765 465 035 465 681 275  Diastolic BP 76 62 68 74 82 64 86  Wt. (Lbs) 206 222.12 229 232 249.12 265.04 261.8  BMI 35.34 38.11 39.29 39.8 42.74 45.47 44.92  Some encounter information is confidential and restricted. Go to Review Flowsheets activity to see all data.   Foot/eye exam completion dates 03/09/2014 11/18/2012  Foot exam Order - -  Foot Form Completion Done Done

## 2014-09-03 NOTE — Assessment & Plan Note (Signed)
increased symptoms with current flare, currently on steroid taper and completing antibiotic course from pulmonary Doc

## 2014-09-03 NOTE — Assessment & Plan Note (Signed)
Controlled, no change in medication Hyperlipidemia:Low fat diet discussed and encouraged.  CMP Latest Ref Rng 05/01/2014 12/14/2013 08/09/2013  Glucose 70 - 99 mg/dL 95 104(H) 149(H)  BUN 6 - 23 mg/dL 20 26(H) 25(H)  Creatinine 0.50 - 1.10 mg/dL 1.13(H) 1.21(H) 0.93  Sodium 135 - 145 mEq/L 143 139 138  Potassium 3.5 - 5.3 mEq/L 4.3 5.5(H) 4.7  Chloride 96 - 112 mEq/L 101 103 102  CO2 19 - 32 mEq/L 30 27 27   Calcium 8.4 - 10.5 mg/dL 9.6 9.1 9.1  Total Protein 6.0 - 8.3 g/dL 6.8 6.4 6.0  Total Bilirubin 0.2 - 1.2 mg/dL 0.5 0.4 0.3  Alkaline Phos 39 - 117 U/L 81 80 88  AST 0 - 37 U/L 15 14 14   ALT 0 - 35 U/L 9 8 12     Lipid Panel     Component Value Date/Time   CHOL 116 05/01/2014 1732   TRIG 103 05/01/2014 1732   HDL 42 05/01/2014 1732   CHOLHDL 2.8 05/01/2014 1732   VLDL 21 05/01/2014 1732   LDLCALC 53 05/01/2014 1732      .ul1

## 2014-09-03 NOTE — Assessment & Plan Note (Signed)
Improved control with involvement of psychiatrist

## 2014-09-03 NOTE — Progress Notes (Signed)
Subjective:    Patient ID: [name], female    DOB: 03-25-1951, 64 y.o.   MRN: [id number]  HPI The PT is here for follow up and re-evaluation of chronic medical conditions, medication management and review of any available recent lab and radiology data.  Preventive health is updated, specifically  Cancer screening and Immunization.   Questions or concerns regarding consultations or procedures which the PT has had in the interim are  addressed. The PT denies any adverse reactions to current medications since the last visit.  5 day increased cough , wheeze and chest tightness, no fever or chills or sputum Denies polyuria, polydipsia, blurred vision , or hypoglycemic episodes.       Review of Systems See HPI Denies recent fever or chills. Denies sinus pressure, nasal congestion, ear pain or sore throat. C/o increased  chest congestion,  Increased non  productive cough or wheezing. Denies chest pains, palpitations and leg swelling Denies abdominal pain, nausea, vomiting,diarrhea or constipation.   Denies dysuria, frequency, hesitancy or incontinence. Denies joint pain, swelling and limitation in mobility. Denies headaches, seizures, numbness, or tingling. Denies depression, anxiety or insomnia. Denies skin break down or rash.        Objective:   Physical Exam  BP 102/62 mmHg  Pulse 85  Resp 16  Ht 5\' 4"  (1.626 m)  Wt 222 lb 1.9 oz (100.753 kg)  BMI 38.11 kg/m2  SpO2 91%  Patient alert and oriented and in mild  cardiopulmonary distress.On 4 liters oxygen  HEENT: No facial asymmetry, EOMI,   oropharynx pink and moist.  Neck supple no JVD, no mass.  Chest: Adequate though reduced air entry, bilateral wheezes, no crackles.  CVS: S1, S2 no murmurs, no S3.Regular rate.  ABD: Soft non tender.   Ext: No edema  MS: decreased ROM spine, shoulders, hips and knees.  Skin: Intact, no ulcerations or rash noted.  Psych: Good eye contact, normal affect. Memory  intact not anxious or depressed appearing.  CNS: CN 2-12 intact, power,  normal throughout.no focal deficits noted.       Assessment & Plan:  COPD, severe increased symptoms with current flare, currently on steroid taper and completing antibiotic course from pulmonary Doc   Hypothyroidism Controlled, no change in medication    Diabetes mellitus, insulin dependent (IDDM), uncontrolled Controlled, no change in medication Patient educated about the importance of limiting  Carbohydrate intake , the need to commit to daily physical activity for a minimum of 30 minutes , and to commit weight loss. The fact that changes in all these areas will reduce or eliminate all together the development of diabetes is stressed.   Diabetic Labs Latest Ref Rng 05/01/2014 12/14/2013 08/09/2013 04/05/2013 01/05/2013  HbA1c <5.7 % 7.0(H) 7.6(H) 8.1(H) 7.7(H) -  Microalbumin 0.00 - 1.89 mg/dL - 0.59 - - -  Micro/Creat Ratio 0.0 - 30.0 mg/g - 2.8 - - -  Chol 0 - 200 mg/dL 116 127 - 126 -  HDL >39 mg/dL 42 50 - 38(L) -  Calc LDL 0 - 99 mg/dL 53 65 - 70 -  Triglycerides <150 mg/dL 103 60 - 92 -  Creatinine 0.50 - 1.10 mg/dL 1.13(H) 1.21(H) 0.93 1.12(H) 1.24(H)   BP/Weight 07/04/2014 03/09/2014 12/15/2013 08/10/2013 04/07/2013 12/29/2012 7/90/2409  Systolic BP 735 329 924 268 341 962 229  Diastolic BP 76 62 68 74 82 64 86  Wt. (Lbs) 206 222.12 229 232 249.12 265.04 261.8  BMI 35.34 38.11 39.29 39.8 42.74 45.47 44.92  Some encounter information is confidential and restricted. Go to Review Flowsheets activity to see all data.   Foot/eye exam completion dates 03/09/2014 11/18/2012  Foot exam Order - -  Foot Form Completion Done Done        Hyperlipidemia LDL goal <100 Controlled, no change in medication Hyperlipidemia:Low fat diet discussed and encouraged.  CMP Latest Ref Rng 05/01/2014 12/14/2013 08/09/2013  Glucose 70 - 99 mg/dL 95 104(H) 149(H)  BUN 6 - 23 mg/dL 20 26(H) 25(H)  Creatinine 0.50 - 1.10 mg/dL  1.13(H) 1.21(H) 0.93  Sodium 135 - 145 mEq/L 143 139 138  Potassium 3.5 - 5.3 mEq/L 4.3 5.5(H) 4.7  Chloride 96 - 112 mEq/L 101 103 102  CO2 19 - 32 mEq/L 30 27 27   Calcium 8.4 - 10.5 mg/dL 9.6 9.1 9.1  Total Protein 6.0 - 8.3 g/dL 6.8 6.4 6.0  Total Bilirubin 0.2 - 1.2 mg/dL 0.5 0.4 0.3  Alkaline Phos 39 - 117 U/L 81 80 88  AST 0 - 37 U/L 15 14 14   ALT 0 - 35 U/L 9 8 12     Lipid Panel     Component Value Date/Time   CHOL 116 05/01/2014 1732   TRIG 103 05/01/2014 1732   HDL 42 05/01/2014 1732   CHOLHDL 2.8 05/01/2014 1732   VLDL 21 05/01/2014 1732   LDLCALC 53 05/01/2014 1732      .ul1   NICOTINE ADDICTION Patient counseled for approximately 5 minutes regarding the health risks of ongoing nicotine use, specifically all types of cancer, heart disease, stroke and respiratory failure. The options available for help with cessation ,the behavioral changes to assist the process, and the option to either gradully reduce usage  Or abruptly stop.is also discussed. Pt is also encouraged to set specific goals in number of cigarettes used daily, as well as to set a quit date.  Number of cigarettes/cigars currently smoking daily: 10 to 15    Anxiety and depression Improved control with involvement of psychiatrist

## 2014-09-04 ENCOUNTER — Other Ambulatory Visit: Payer: Self-pay

## 2014-09-04 MED ORDER — HYDROCODONE-ACETAMINOPHEN 10-325 MG PO TABS: 1.0000 | ORAL_TABLET | Freq: Four times a day (QID) | ORAL | Status: DC | PRN

## 2014-09-05 DIAGNOSIS — E109 Type 1 diabetes mellitus without complications: Secondary | ICD-10-CM | POA: Diagnosis not present

## 2014-09-05 DIAGNOSIS — E039 Hypothyroidism, unspecified: Secondary | ICD-10-CM | POA: Diagnosis not present

## 2014-09-05 DIAGNOSIS — E1065 Type 1 diabetes mellitus with hyperglycemia: Secondary | ICD-10-CM | POA: Diagnosis not present

## 2014-09-06 LAB — COMPLETE METABOLIC PANEL WITH GFR
ALK PHOS: 78 U/L (ref 39–117)
ALT: 10 U/L (ref 0–35)
AST: 17 U/L (ref 0–37)
Albumin: 3.9 g/dL (ref 3.5–5.2)
BUN: 22 mg/dL (ref 6–23)
CO2: 27 meq/L (ref 19–32)
Calcium: 8.8 mg/dL (ref 8.4–10.5)
Chloride: 102 mEq/L (ref 96–112)
Creat: 1.12 mg/dL — ABNORMAL HIGH (ref 0.50–1.10)
GFR, Est African American: 61 mL/min
GFR, Est Non African American: 53 mL/min — ABNORMAL LOW
Glucose, Bld: 175 mg/dL — ABNORMAL HIGH (ref 70–99)
Potassium: 3.9 mEq/L (ref 3.5–5.3)
Sodium: 140 mEq/L (ref 135–145)
Total Bilirubin: 0.4 mg/dL (ref 0.2–1.2)
Total Protein: 6.4 g/dL (ref 6.0–8.3)

## 2014-09-06 LAB — TSH: TSH: 2.373 u[IU]/mL (ref 0.350–4.500)

## 2014-09-06 LAB — HEMOGLOBIN A1C
Hgb A1c MFr Bld: 7.3 % — ABNORMAL HIGH (ref <5.7)
Mean Plasma Glucose: 163 mg/dL — ABNORMAL HIGH (ref <117)

## 2014-09-15 ENCOUNTER — Ambulatory Visit (HOSPITAL_COMMUNITY): Payer: Self-pay | Admitting: Psychology

## 2014-09-29 ENCOUNTER — Other Ambulatory Visit: Payer: Self-pay

## 2014-09-29 MED ORDER — HYDROCODONE-ACETAMINOPHEN 10-325 MG PO TABS: 1.0000 | ORAL_TABLET | Freq: Four times a day (QID) | ORAL | Status: DC | PRN

## 2014-10-02 ENCOUNTER — Ambulatory Visit (HOSPITAL_COMMUNITY): Payer: Self-pay | Admitting: Psychiatry

## 2014-10-05 ENCOUNTER — Encounter (HOSPITAL_COMMUNITY): Payer: Self-pay | Admitting: Psychology

## 2014-10-05 ENCOUNTER — Ambulatory Visit (INDEPENDENT_AMBULATORY_CARE_PROVIDER_SITE_OTHER): Payer: Medicare Other | Admitting: Psychology

## 2014-10-05 DIAGNOSIS — F333 Major depressive disorder, recurrent, severe with psychotic symptoms: Secondary | ICD-10-CM

## 2014-10-05 DIAGNOSIS — F4001 Agoraphobia with panic disorder: Secondary | ICD-10-CM | POA: Diagnosis not present

## 2014-10-05 NOTE — Progress Notes (Signed)
     PROGRESS NOTE  Patient:  [name]   DOB: 08/21/1950  MR Number: [id number]  Location: [facility] [address] [place] [address] Dept: [phone]  Start: 4 PM End: 5 PM  Provider/Observer:     Edgardo Roys PSYD  Chief Complaint:      Chief Complaint  Patient presents with  . Anxiety  . Depression    Reason For Service:     The patient was referred for psychotherapeutic interventions because of ongoing symptoms of depression, anxiety, and mood swings including anger outbursts particularly of a verbal nature.  Interventions Strategy:  Cognitive/behavioral psychotherapeutic interventions  Participation Level:   Active  Participation Quality:  Appropriate      Behavioral Observation:  Well Groomed, Alert, and Appropriate.   Current Psychosocial Factors: The patient reports that she knows that she needs to get out of the house more but this has continued to be hard for her with COPD issues.  Content of Session:   Review current symptoms and continued work on therapeutic interventions for issues of recurrent depression and panic attacks.  Current Status:   The patient reports that she has been doing a little better with her mood but issues of depression continue.  She has been working on coping and dealing with severe COPD.  Patient Progress:   The patient reports that she had started doing much worse.  She felt it was related to side effects of Rexulti and she stopped that medication on her own.  Target Goals:   Target goals have to do with reducing the intensity, duration, and frequency of depressive events as well as reducing the intensity, duration, and frequency of panic attacks. Target goals include building coping skills and strategies around both of these diagnostic issues.  Last Reviewed:   10/05/2014  Goals Addressed Today:    Specific goals addressed today  had to do with managing and coping with her cognitive and behavioral responses to stressors particularly around issues that may lead to full-blown panic attacks when she is in medical situations. These have been particular triggers for her in the past for panic attacks due to her medical illnesses and past medical treatments.  Impression/Diagnosis:   The patient has had a lot of significant medical issues to deal with particularly regarding pulmonary function. However, she also has a long history of depression and panic attacks have become exacerbated due to to persistent and serious medical issues.  Diagnosis:    Axis I:  Severe recurrent major depressive disorder with psychotic features  Panic disorder with agoraphobia and moderate panic attacks      Axis II: No diagnosis     RODENBOUGH,JOHN R, PsyD 10/05/2014

## 2014-10-10 ENCOUNTER — Encounter (HOSPITAL_COMMUNITY): Payer: Self-pay | Admitting: Psychiatry

## 2014-10-10 ENCOUNTER — Ambulatory Visit (INDEPENDENT_AMBULATORY_CARE_PROVIDER_SITE_OTHER): Payer: Medicare Other | Admitting: Psychiatry

## 2014-10-10 VITALS — BP 140/88 | HR 76 | Ht 64.0 in | Wt 226.2 lb

## 2014-10-10 DIAGNOSIS — F4001 Agoraphobia with panic disorder: Secondary | ICD-10-CM

## 2014-10-10 DIAGNOSIS — F333 Major depressive disorder, recurrent, severe with psychotic symptoms: Secondary | ICD-10-CM

## 2014-10-10 MED ORDER — ALPRAZOLAM 1 MG PO TABS: 1.0000 mg | ORAL_TABLET | Freq: Four times a day (QID) | ORAL | Status: DC

## 2014-10-10 MED ORDER — FLUOXETINE HCL 20 MG PO CAPS: ORAL_CAPSULE | ORAL | Status: DC

## 2014-10-10 NOTE — Progress Notes (Signed)
Patient ID: RAMIA SIDNEY, female   DOB: 1951/04/25, 64 y.o.   MRN: [id number] Patient ID: BREINDY MEADOW, female   DOB: 12-Jul-1950, 64 y.o.   MRN: [id number] Patient ID: EMMER LILLIBRIDGE, female   DOB: 03/07/1951, 64 y.o.   MRN: [id number]  Psychiatric Assessment Adult  Patient Identification:  [name] Date of Evaluation:  10/10/2014 Chief Complaint: depression History of Chief Complaint:   Chief Complaint  Patient presents with  . Depression    Anxiety Symptoms include nervous/anxious behavior and shortness of breath.    this patient is a 64 year old widowed black female who lives alone in [place]. She has no children. She is on disability.  The patient was referred by Dr. Moshe Cipro her primary care physician and Dr. Jefm Miles her psychologist for further evaluation and treatment of major depression and anxiety.  The patient states that she's been depressed since her teen years. She's not really sure why. When she was in college she went through severe depression and had to quit. She's had this on and off through the years but really not receive treatment until a couple years ago after her husband died. Her primary care physician has put her on Prozac and Xanax which have helped to some degree.however recently she has been getting more depressed. She has severe COPD and is on oxygen. Yet, she continues to smoke THREE PACKS PER DAY which she claims is secondary to her depressed mood.  Recently she's gone through more deaths including her sister in 2014 a niece in 2014. Another sister was recently diagnosed with cancer. The patient has gotten overwhelmed by all this and claims it's getting hard for her to just get out of bed and function. She has no energy, she's been shaky and having tremors in her hands, she complains of short-term memory loss.her sleep is variable and Ambien sometimes helps. Her appetite is up and down. Sometimes she hears the voices of her deceased relatives or  "feels her spirits." She's never been to a psychiatric hospitalization  But has been seeing Dr. Jefm Miles in our office for several years. She has never been suicidal and states she is very "spiritual and values life. She stays away from people but she has a god niece and  God sister who check on her faithfully.she denies any other psychotic symptoms and does not use drugs or alcohol  The patient returns after 2 months. She is doing okay but sometimes has days in which she feels rather down. She tends to worry about people in her family in the state of the world. She states this doesn't happen very often. She is trying to get out of her house more but she only usually gets out just for doctor appointments. Her mood is been stable and she feels like the Xanax really helps her anxiety. She is sleeping well at night. She denies any psychotic symptoms Review of Systems  Constitutional: Positive for activity change, appetite change and fatigue.  HENT: Negative.   Eyes: Negative.   Respiratory: Positive for shortness of breath.   Cardiovascular: Negative.   Gastrointestinal: Negative.   Endocrine: Negative.   Genitourinary: Negative.   Musculoskeletal: Positive for arthralgias.  Skin: Negative.   Allergic/Immunologic: Negative.   Neurological: Positive for tremors.  Hematological: Negative.   Psychiatric/Behavioral: Positive for hallucinations, sleep disturbance and dysphoric mood. The patient is nervous/anxious.    Physical Examnot done  Depressive Symptoms: depressed mood, anhedonia, psychomotor retardation, difficulty concentrating, impaired memory, anxiety, disturbed sleep, decreased appetite,  (  Hypo) Manic Symptoms:   Elevated Mood:  No Irritable Mood:  No Grandiosity:  No Distractibility:  No Labiality of Mood:  No Delusions:  No Hallucinations:  Yes Impulsivity:  Yes Sexually Inappropriate Behavior:  No Financial Extravagance:  No Flight of Ideas:  No  Anxiety  Symptoms: Excessive Worry:  Yes Panic Symptoms:  No Agoraphobia:  No Obsessive Compulsive: No  Symptoms: None, Specific Phobias:  No Social Anxiety:  No  Psychotic Symptoms:  Hallucinations: Yes Auditory Delusions:  No Paranoia:  No   Ideas of Reference:  No  PTSD Symptoms: Ever had a traumatic exposure:  No Had a traumatic exposure in the last month:  No Re-experiencing: No None Hypervigilance:  No Hyperarousal: No None Avoidance: No None  Traumatic Brain Injury: No   Past Psychiatric History: Diagnosis:Maj. depression  Hospitalizations: none  Outpatient Care: has been seeing Dr. Jefm Miles in our office for counseling  Substance Abuse Care: none  Self-Mutilation: none  Suicidal Attempts:none  Violent Behaviors: none   Past Medical History:   Past Medical History  Diagnosis Date  . DJD (degenerative joint disease)     of the spine   . GERD (gastroesophageal reflux disease)   . Insomnia   . COPD (chronic obstructive pulmonary disease)   . Obesity   . Anxiety   . Hypothyroidism 2004    left thyroidectomy  . Diabetes mellitus, type 1 1983    type 2 diabetes  . Hypertension 1983  . Hyperlipidemia 2007  . Depression 2008   History of Loss of Consciousness:  No Seizure History:  No Cardiac History:  No Allergies:   Allergies  Allergen Reactions  . Codeine     REACTION: nausea   Current Medications:  Current Outpatient Prescriptions  Medication Sig Dispense Refill  . ADVAIR DISKUS 250-50 MCG/DOSE AEPB inhale 1 dose by mouth twice a day 60 each 2  . ALPRAZolam (XANAX) 1 MG tablet Take 1 tablet (1 mg total) by mouth 4 (four) times daily. 120 tablet 2  . aspirin (ASPIRIN LOW DOSE) 81 MG EC tablet Take 81 mg by mouth daily.      . B-D INS SYR ULTRAFINE 1CC/31G 31G X 5/16" 1 ML MISC use as directed FOR ONCE DAILY TESTING 100 each 5  . calcium carbonate (TUMS) 500 MG chewable tablet Chew 1 tablet by mouth as needed.      . cloNIDine (CATAPRES) 0.1 MG tablet  take 1 tablet by mouth twice a day 60 tablet 2  . CRESTOR 20 MG tablet take 1 tablet by mouth once daily 30 tablet 2  . FLUoxetine (PROZAC) 20 MG capsule Take two in the am and one in the evening 90 capsule 2  . Foot Care Products (DIABETIC INSOLES) MISC by Does not apply route. And Shoes    . furosemide (LASIX) 40 MG tablet take 1 tablet once daily 30 tablet 4  . GLIPIZIDE XL 2.5 MG 24 hr tablet take 1 tablet by mouth once daily 30 tablet 2  . Glucose Blood (BAYER BREEZE 2 TEST) DISK by In Vitro route. Lancets and strips for four times a day testing     . guaiFENesin 100 MG/5ML SOLN Take 100 mg by mouth 4 (four) times daily. 15 ml by mouth 4 times a day     . HYDROcodone-acetaminophen (NORCO) 10-325 MG per tablet Take 1 tablet by mouth every 6 (six) hours as needed. 120 tablet 0  . insulin glargine (LANTUS) 100 UNIT/ML injection 50 units once daily (Patient  taking differently: 50 units once daily prn) 30 mL 3  . ipratropium-albuterol (DUONEB) 0.5-2.5 (3) MG/3ML SOLN Take 3 mLs by nebulization 4 (four) times daily as needed.     . levalbuterol (XOPENEX HFA) 45 MCG/ACT inhaler Inhale 1-2 puffs into the lungs as needed.      Marland Kitchen levothyroxine (SYNTHROID, LEVOTHROID) 100 MCG tablet take 1 tablet by mouth once daily 30 tablet 2  . lisinopril-hydrochlorothiazide (PRINZIDE,ZESTORETIC) 20-12.5 MG per tablet take 1 tablet by mouth once daily 30 tablet 4  . meloxicam (MOBIC) 15 MG tablet take 1 tablet by mouth once daily 30 tablet 3  . montelukast (SINGULAIR) 10 MG tablet take 1 tablet by mouth at bedtime 30 tablet 2  . NEXIUM 40 MG capsule take 1 capsule by mouth once daily BEFORE BREAKFAST 30 capsule 2  . potassium chloride 20 MEQ/15ML (10%) solution TAKE 1 AND 1/2 TEASPOONFUL BY MOUTH ONCE DAILY 240 mL 2  . tiZANidine (ZANAFLEX) 4 MG capsule Take 1 capsule (4 mg total) by mouth 3 (three) times daily. 90 capsule 4  . zolpidem (AMBIEN) 10 MG tablet take 1 tablet by mouth at bedtime for sleep 30 tablet 2    No current facility-administered medications for this visit.    Previous Psychotropic Medications:  Medication Dose   Wellbutrin-caused irritability                       Substance Abuse History in the last 12 months: Substance Age of 1st Use Last Use Amount Specific Type  Nicotine   Smokes 3 packs of cigarettes per day   Alcohol      Cannabis      Opiates      Cocaine      Methamphetamines      LSD      Ecstasy      Benzodiazepines      Caffeine      Inhalants      Others:                          Medical Consequences of Substance Abuse: smoking is making her COPD much worse  Legal Consequences of Substance Abuse: none  Family Consequences of Substance Abuse: none  Blackouts:  No DT's:  No Withdrawal Symptoms:  No None  Social History: Current Place of Residence: [place] of Birth: [place] Family Members: 2 sisters Marital Status:  Widowed Children: none   Relationships:  Education:  Dentist Problems/Performance:  Religious Beliefs/Practices: Christian History of Abuse: none Energy manager History:  None. Legal History: none Hobbies/Interests: listening to gospel music  Family History:   Family History  Problem Relation Age of Onset  . Hypertension Mother   . Stroke Mother   . Cancer Father     lung   . Kidney failure      family history   . Hypertension      family history   . Hypertension Sister   . Depression Sister   . Alcohol abuse Sister   . Hypertension Sister   . Depression Sister   . Diabetes Sister   . Depression Sister     Mental Status Examination/Evaluation: Objective:  Appearance: Casual and Fairly Groomed  using portable oxygen  Eye Contact::  Good  Speech:  Clear and Coherent  Volume:  Decreased  Mood:good  Affect:  Bright  Thought Process:  Circumstantial  Orientation:  Full (Time, Place, and  Person)  Thought Content:  Rumination    Suicidal Thoughts:  No  Homicidal Thoughts:  No  Judgement:  Fair  Insight:  Fair  Psychomotor Activity:  Decreased  Akathisia:  No  Handed:  Right  AIMS (if indicated):    Assets:  Communication Skills Desire for Improvement Social Support    Laboratory/X-Ray Psychological Evaluation(s)   reviewed in chart     Assessment:  Axis I: Major Depression, Recurrent severe  AXIS I Major Depression, Recurrent severe  AXIS II Deferred  AXIS III Past Medical History  Diagnosis Date  . DJD (degenerative joint disease)     of the spine   . GERD (gastroesophageal reflux disease)   . Insomnia   . COPD (chronic obstructive pulmonary disease)   . Obesity   . Anxiety   . Hypothyroidism 2004    left thyroidectomy  . Diabetes mellitus, type 1 1983    type 2 diabetes  . Hypertension 1983  . Hyperlipidemia 2007  . Depression 2008     AXIS IV problems related to social environment  AXIS V 41-50 serious symptoms   Treatment Plan/Recommendations:  Plan of Care: medication management  Laboratory:   Psychotherapy: she is seeing Dr. Jefm Miles here  Medications: she'll continue Prozac 60 mg daily for depression and Xanax 1 mg 4 times a day for anxiety   Routine PRN Medications:  No  Consultations:   Safety Concerns:  She denies thoughts or plans of self-harm  Other:she'll return in 3 months     Levonne Spiller, MD 5/10/20163:20 PM

## 2014-10-12 ENCOUNTER — Telehealth (HOSPITAL_COMMUNITY): Payer: Self-pay | Admitting: *Deleted

## 2014-10-12 NOTE — Telephone Encounter (Signed)
Called to complete prior authorization of Alprazolam. Was told it must be done on covermymeds. Submitted online and waiting on decision.

## 2014-10-13 ENCOUNTER — Telehealth (HOSPITAL_COMMUNITY): Payer: Self-pay | Admitting: *Deleted

## 2014-10-13 NOTE — Telephone Encounter (Signed)
Pt insurance company called stating that pt medication that was requiring prior [name] was approved

## 2014-10-24 ENCOUNTER — Other Ambulatory Visit: Payer: Self-pay | Admitting: Family Medicine

## 2014-10-24 ENCOUNTER — Other Ambulatory Visit: Payer: Self-pay

## 2014-10-24 DIAGNOSIS — Z1231 Encounter for screening mammogram for malignant neoplasm of breast: Secondary | ICD-10-CM

## 2014-10-24 MED ORDER — HYDROCODONE-ACETAMINOPHEN 10-325 MG PO TABS: 1.0000 | ORAL_TABLET | Freq: Four times a day (QID) | ORAL | Status: DC | PRN

## 2014-10-31 ENCOUNTER — Ambulatory Visit (HOSPITAL_COMMUNITY): Payer: Self-pay | Admitting: Psychiatry

## 2014-11-02 ENCOUNTER — Other Ambulatory Visit: Payer: Self-pay | Admitting: Family Medicine

## 2014-11-03 ENCOUNTER — Other Ambulatory Visit: Payer: Self-pay

## 2014-11-03 MED ORDER — LEVOTHYROXINE SODIUM 100 MCG PO TABS: 100.0000 ug | ORAL_TABLET | Freq: Every day | ORAL | Status: DC

## 2014-11-03 MED ORDER — MONTELUKAST SODIUM 10 MG PO TABS: 10.0000 mg | ORAL_TABLET | Freq: Every day | ORAL | Status: DC

## 2014-11-03 MED ORDER — ESOMEPRAZOLE MAGNESIUM 40 MG PO CPDR: DELAYED_RELEASE_CAPSULE | ORAL | Status: DC

## 2014-11-07 ENCOUNTER — Ambulatory Visit (HOSPITAL_COMMUNITY): Payer: Self-pay | Admitting: Psychology

## 2014-11-08 ENCOUNTER — Ambulatory Visit (HOSPITAL_COMMUNITY): Payer: Self-pay

## 2014-11-09 ENCOUNTER — Ambulatory Visit (INDEPENDENT_AMBULATORY_CARE_PROVIDER_SITE_OTHER): Payer: Medicare Other | Admitting: Psychology

## 2014-11-09 ENCOUNTER — Encounter (HOSPITAL_COMMUNITY): Payer: Self-pay | Admitting: Psychology

## 2014-11-09 DIAGNOSIS — F333 Major depressive disorder, recurrent, severe with psychotic symptoms: Secondary | ICD-10-CM

## 2014-11-09 DIAGNOSIS — F4001 Agoraphobia with panic disorder: Secondary | ICD-10-CM | POA: Diagnosis not present

## 2014-11-09 NOTE — Progress Notes (Signed)
     PROGRESS NOTE  Patient:  [name]   DOB: Dec 12, 1950  MR Number: [id number]  Location: [facility] [address] [place] [address] Dept: [phone]  Start: 4 PM End: 5 PM   Provider/Observer:     Edgardo Roys PSYD  Chief Complaint:      Chief Complaint  Patient presents with  . Depression    Reason For Service:     The patient was referred for psychotherapeutic interventions because of ongoing symptoms of depression, anxiety, and mood swings including anger outbursts particularly of a verbal nature.  Interventions Strategy:  Cognitive/behavioral psychotherapeutic interventions  Participation Level:   Active  Participation Quality:  Appropriate      Behavioral Observation:  Well Groomed, Alert, and Appropriate.   Current Psychosocial Factors: The patient reports that she has started doing much worse.  She was getting more panic, fear,   Content of Session:   Review current symptoms and continued work on therapeutic interventions for issues of recurrent depression and panic attacks.  Current Status:   The patient reports that she started doing very poorly with Rexulti.  She feels it started causing sever mussel movement, drooling uncontrollably, drifting in and out of conscious, blood sugar levels getting out of control, and significantly increased levels of panic attacks.  She reports that she stopped this medication on her own and feels these symptoms have improved a great deal.  Patient Progress:   The patient reports that she had started doing much worse.  She felt it was related to side effects of Rexulti and she stopped that medication on her own.  Target Goals:   Target goals have to do with reducing the intensity, duration, and frequency of depressive events as well as reducing the intensity, duration, and frequency of panic attacks. Target goals include  building coping skills and strategies around both of these diagnostic issues.  Last Reviewed:   07/04/2014  Goals Addressed Today:    Specific goals addressed today had to do with managing and coping with her cognitive and behavioral responses to stressors particularly around issues that may lead to full-blown panic attacks when she is in medical situations. These have been particular triggers for her in the past for panic attacks due to her medical illnesses and past medical treatments.  Impression/Diagnosis:   The patient has had a lot of significant medical issues to deal with particularly regarding pulmonary function. However, she also has a long history of depression and panic attacks have become exacerbated due to to persistent and serious medical issues.  Diagnosis:    Axis I:  Severe recurrent major depressive disorder with psychotic features  Panic disorder with agoraphobia and moderate panic attacks      Axis II: No diagnosis     Bintou Lafata R, PsyD 11/09/2014

## 2014-11-09 NOTE — Progress Notes (Signed)
     PROGRESS NOTE  Patient:  [name]   DOB: 02-15-1951  MR Number: [id number]  Location: [facility] [address] [place] [address] Dept: [phone]  Start: 4 PM End: 5 PM  Provider/Observer:     Edgardo Roys PSYD  Chief Complaint:      Chief Complaint  Patient presents with  . Anxiety  . Depression  . Stress  . Trauma    Reason For Service:     The patient was referred for psychotherapeutic interventions because of ongoing symptoms of depression, anxiety, and mood swings including anger outbursts particularly of a verbal nature.  Interventions Strategy:  Cognitive/behavioral psychotherapeutic interventions  Participation Level:   Active  Participation Quality:  Appropriate      Behavioral Observation:  Well Groomed, Alert, and Appropriate.   Current Psychosocial Factors: The patient reports that her sister has had trouble with her cancer.  The patient however, has not been able to focus on herself and had many times where her o2 dropped very low.  She was also not able to afford her xanax due to issues with pharmacy.  Very stressfull her Content of Session:   Review current symptoms and continued work on therapeutic interventions for issues of recurrent depression and panic attacks.  Current Status:   The patient reports that she has been doing a little better with her mood but issues of depression continue.  She has been working on coping and dealing with severe COPD.  Patient Progress:   The patient reports that she had started doing much worse.  She felt it was related to side effects of Rexulti and she stopped that medication on her own.  Target Goals:   Target goals have to do with reducing the intensity, duration, and frequency of depressive events as well as reducing the intensity, duration, and frequency of panic attacks. Target goals include building  coping skills and strategies around both of these diagnostic issues.  Last Reviewed:   11/09/2014  Goals Addressed Today:    Specific goals addressed today had to do with managing and coping with her cognitive and behavioral responses to stressors particularly around issues that may lead to full-blown panic attacks when she is in medical situations. These have been particular triggers for her in the past for panic attacks due to her medical illnesses and past medical treatments.  Impression/Diagnosis:   The patient has had a lot of significant medical issues to deal with particularly regarding pulmonary function. However, she also has a long history of depression and panic attacks have become exacerbated due to to persistent and serious medical issues.  Diagnosis:    Axis I:  Severe recurrent major depressive disorder with psychotic features  Panic disorder with agoraphobia and moderate panic attacks      Axis II: No diagnosis     Island Dohmen R, PsyD 11/09/2014

## 2014-11-13 ENCOUNTER — Ambulatory Visit (HOSPITAL_COMMUNITY): Payer: Self-pay

## 2014-11-13 ENCOUNTER — Ambulatory Visit (HOSPITAL_COMMUNITY)
Admission: RE | Admit: 2014-11-13 | Discharge: 2014-11-13 | Disposition: A | Discharge status: Home / Self Care | Payer: Medicare Other | Source: Ambulatory Visit | Attending: Family Medicine | Admitting: Family Medicine

## 2014-11-13 ENCOUNTER — Other Ambulatory Visit: Payer: Self-pay | Admitting: Family Medicine

## 2014-11-13 DIAGNOSIS — Z1231 Encounter for screening mammogram for malignant neoplasm of breast: Secondary | ICD-10-CM | POA: Insufficient documentation

## 2014-11-15 ENCOUNTER — Ambulatory Visit (HOSPITAL_COMMUNITY): Payer: Self-pay

## 2014-11-17 ENCOUNTER — Other Ambulatory Visit: Payer: Self-pay | Admitting: Family Medicine

## 2014-11-17 DIAGNOSIS — R928 Other abnormal and inconclusive findings on diagnostic imaging of breast: Secondary | ICD-10-CM

## 2014-11-20 ENCOUNTER — Ambulatory Visit (HOSPITAL_COMMUNITY): Payer: Self-pay

## 2014-11-27 ENCOUNTER — Telehealth (HOSPITAL_COMMUNITY): Payer: Self-pay | Admitting: *Deleted

## 2014-11-27 NOTE — Telephone Encounter (Addendum)
Phone call from [place] stating that pt other pharmacy [facility]) called them stating that pt was trying to fill her Xanax with them (11-19-14) which was written by Dr. Harrington Challenger. [facility] stated that [facility] request was denied due to pt just getting script filled by Dr. Griffin Dakin office 11-18-14. Per [employer], she just wanted Dr. Harrington Challenger office to be aware of what was going on

## 2014-11-28 NOTE — Telephone Encounter (Signed)
noted 

## 2014-11-29 ENCOUNTER — Encounter: Payer: Self-pay | Admitting: Family Medicine

## 2014-11-29 ENCOUNTER — Ambulatory Visit (INDEPENDENT_AMBULATORY_CARE_PROVIDER_SITE_OTHER): Payer: Medicare Other | Admitting: Family Medicine

## 2014-11-29 ENCOUNTER — Telehealth (HOSPITAL_COMMUNITY): Payer: Self-pay | Admitting: *Deleted

## 2014-11-29 VITALS — BP 114/78 | HR 89 | Resp 16 | Ht 63.0 in | Wt 219.0 lb

## 2014-11-29 DIAGNOSIS — Z720 Tobacco use: Secondary | ICD-10-CM

## 2014-11-29 DIAGNOSIS — Z0000 Encounter for general adult medical examination without abnormal findings: Secondary | ICD-10-CM | POA: Diagnosis not present

## 2014-11-29 DIAGNOSIS — I10 Essential (primary) hypertension: Secondary | ICD-10-CM

## 2014-11-29 DIAGNOSIS — E1165 Type 2 diabetes mellitus with hyperglycemia: Secondary | ICD-10-CM

## 2014-11-29 DIAGNOSIS — F17200 Nicotine dependence, unspecified, uncomplicated: Secondary | ICD-10-CM

## 2014-11-29 DIAGNOSIS — IMO0001 Reserved for inherently not codable concepts without codable children: Secondary | ICD-10-CM

## 2014-11-29 DIAGNOSIS — E1065 Type 1 diabetes mellitus with hyperglycemia: Secondary | ICD-10-CM

## 2014-11-29 DIAGNOSIS — F17210 Nicotine dependence, cigarettes, uncomplicated: Secondary | ICD-10-CM | POA: Diagnosis not present

## 2014-11-29 DIAGNOSIS — Z23 Encounter for immunization: Secondary | ICD-10-CM

## 2014-11-29 DIAGNOSIS — Z794 Long term (current) use of insulin: Secondary | ICD-10-CM

## 2014-11-29 NOTE — Assessment & Plan Note (Signed)

## 2014-11-29 NOTE — Progress Notes (Signed)
Subjective:    Patient ID: [name], female    DOB: Mar 06, 1951, 64 y.o.   MRN: [id number]  HPI Preventive Screening-Counseling & Management   Patient present here today for a Medicare annual wellness visit.   Current Problems (verified)   Medications Prior to Visit Allergies (verified)   PAST HISTORY  Family History (verified)   Social History  Widowed for 3 years, no children, Became disabled at age 64, lives alone. Smokes 1/2 pack per day    Risk Factors  Current exercise habits:  When she feels good she does some exercises at home   Dietary issues discussed: encouraged to eat more fruits and vegetables and to limit fried foods and carbs    Cardiac risk factors:   Depression Screen  (Note: if answer to either of the following is "Yes", a more complete depression screening is indicated)   Over the past two weeks, have you felt down, depressed or hopeless? yes Over the past two weeks, have you felt little interest or pleasure in doing things? yes Have you lost interest or pleasure in daily life? No  Do you often feel hopeless? No  Do you cry easily over simple problems? No   Activities of Daily Living  In your present state of health, do you have any difficulty performing the following activities?  Driving?: No Managing money?: No Feeding yourself?:No Getting from bed to chair?:yes Climbing a flight of stairs?: has to be very few  Preparing food and eating?:No Bathing or showering?: becoming more difficult- has to use the shower chair Getting dressed?:No Getting to the toilet?:No Using the toilet?:No Moving around from place to place?: yes  Fall Risk Assessment In the past year have you fallen or had a near fall?:No Are you currently taking any medications that make you dizzy?:No   Hearing Difficulties: No Do you often ask people to speak up or repeat themselves? States she can't hear good  Do you experience ringing or noises in your ears?:No Do  you have difficulty understanding soft or whispered voices?:yes  Cognitive Testing  Alert? Yes Normal Appearance?Yes  Oriented to person? Yes Place? Yes  Time? Yes  Displays appropriate judgment?Yes  Can read the correct time from a watch face? yes Are you having problems remembering things? Has trouble with short term memory and sometimes has to search for words   Advanced Directives have been discussed with the patient?Yes    List the Names of Other Physician/Practitioners you currently use:  Dr Jefm Miles (psychologist)  Dr Harrington Challenger (psychiatrist)  Dr Luan Pulling (pulmonary)  Indicate any recent Medical Services you may have received from other than [facility] providers in the past year (date may be approximate).   Assessment:    Annual Wellness Exam   Plan:    Medicare Attestation  I have personally reviewed:  The patient's medical and social history  Their use of alcohol, tobacco or illicit drugs  Their current medications and supplements  The patient's functional ability including ADLs,fall risks, home safety risks, cognitive, and hearing and visual impairment  Diet and physical activities  Evidence for depression or mood disorders  The patient's weight, height, BMI, and visual acuity have been recorded in the chart. I have made referrals, counseling, and provided education to the patient based on review of the above and I have provided the patient with a written personalized care plan for preventive services.      Review of Systems     Objective:   Physical Exam BP  114/78 mmHg  Pulse 89  Resp 16  Ht '5\' 3"'$  (1.6 m)  Wt 219 lb (99.338 kg)  BMI 38.80 kg/m2  SpO2 91%        Assessment & Plan:  Medicare annual wellness visit, subsequent Annual exam as documented. Counseling done  re healthy lifestyle involving commitment to 150 minutes exercise per week, heart healthy diet, and attaining healthy weight.The importance of adequate sleep also discussed. Regular seat belt  use and home safety, is also discussed. Changes in health habits are decided on by the patient with goals and time frames  set for achieving them. Immunization and cancer screening needs are specifically addressed at this visit.   NICOTINE ADDICTION Patient counseled for approximately 5 minutes regarding the health risks of ongoing nicotine use, specifically all types of cancer, heart disease, stroke and respiratory failure. The options available for help with cessation ,the behavioral changes to assist the process, and the option to either gradully reduce usage  Or abruptly stop.is also discussed. Pt is also encouraged to set specific goals in number of cigarettes used daily, as well as to set a quit date.  Number of cigarettes/cigars currently smoking daily: 10   Need for vaccination with 13-polyvalent pneumococcal conjugate vaccine After obtaining informed consent, the vaccine is  administered by LPN.

## 2014-11-29 NOTE — Assessment & Plan Note (Signed)

## 2014-11-29 NOTE — Patient Instructions (Addendum)
F/u in  End July, call if you need me before  Prevnar today  Fasting labs including urine  July 16 or after  Please work on smoking cessation  Thanks for choosing [facility], we consider it a privelige to serve you.

## 2014-11-30 DIAGNOSIS — Z23 Encounter for immunization: Secondary | ICD-10-CM | POA: Insufficient documentation

## 2014-11-30 NOTE — Assessment & Plan Note (Signed)
After obtaining informed consent, the vaccine is  administered by LPN.  

## 2014-12-01 ENCOUNTER — Other Ambulatory Visit: Payer: Self-pay | Admitting: Family Medicine

## 2014-12-01 ENCOUNTER — Other Ambulatory Visit: Payer: Self-pay

## 2014-12-01 MED ORDER — HYDROCODONE-ACETAMINOPHEN 10-325 MG PO TABS: 1.0000 | ORAL_TABLET | Freq: Four times a day (QID) | ORAL | Status: DC | PRN

## 2014-12-05 ENCOUNTER — Other Ambulatory Visit: Payer: Self-pay | Admitting: Family Medicine

## 2014-12-05 ENCOUNTER — Ambulatory Visit (HOSPITAL_COMMUNITY)
Admission: RE | Admit: 2014-12-05 | Discharge: 2014-12-05 | Disposition: A | Discharge status: Home / Self Care | Payer: Medicare Other | Source: Ambulatory Visit | Attending: Family Medicine | Admitting: Family Medicine

## 2014-12-05 DIAGNOSIS — R928 Other abnormal and inconclusive findings on diagnostic imaging of breast: Secondary | ICD-10-CM

## 2014-12-05 DIAGNOSIS — R921 Mammographic calcification found on diagnostic imaging of breast: Secondary | ICD-10-CM

## 2014-12-05 DIAGNOSIS — R922 Inconclusive mammogram: Secondary | ICD-10-CM | POA: Diagnosis not present

## 2014-12-08 ENCOUNTER — Ambulatory Visit (HOSPITAL_COMMUNITY): Payer: Self-pay | Admitting: Psychology

## 2014-12-08 ENCOUNTER — Telehealth (HOSPITAL_COMMUNITY): Payer: Self-pay | Admitting: *Deleted

## 2014-12-11 ENCOUNTER — Ambulatory Visit
Admission: RE | Admit: 2014-12-11 | Discharge: 2014-12-11 | Disposition: A | Discharge status: Home / Self Care | Payer: Medicare Other | Source: Ambulatory Visit | Attending: Family Medicine | Admitting: Family Medicine

## 2014-12-11 ENCOUNTER — Other Ambulatory Visit: Payer: Self-pay | Admitting: Family Medicine

## 2014-12-11 DIAGNOSIS — R921 Mammographic calcification found on diagnostic imaging of breast: Secondary | ICD-10-CM

## 2014-12-11 DIAGNOSIS — D241 Benign neoplasm of right breast: Secondary | ICD-10-CM | POA: Diagnosis not present

## 2014-12-12 ENCOUNTER — Telehealth (HOSPITAL_COMMUNITY): Payer: Self-pay | Admitting: *Deleted

## 2014-12-13 ENCOUNTER — Ambulatory Visit (INDEPENDENT_AMBULATORY_CARE_PROVIDER_SITE_OTHER): Payer: Medicare Other | Admitting: Psychology

## 2014-12-13 ENCOUNTER — Encounter (HOSPITAL_COMMUNITY): Payer: Self-pay | Admitting: Psychology

## 2014-12-13 DIAGNOSIS — F4001 Agoraphobia with panic disorder: Secondary | ICD-10-CM

## 2014-12-13 DIAGNOSIS — F333 Major depressive disorder, recurrent, severe with psychotic symptoms: Secondary | ICD-10-CM

## 2014-12-13 NOTE — Progress Notes (Signed)
     PROGRESS NOTE  Patient:  [name]   DOB: 1950-06-12  MR Number: [id number]  Location: [facility] [address] [place] [address] Dept: [phone]  Start: 4 PM End: 5 PM  Provider/Observer:     Edgardo Roys PSYD  Chief Complaint:      Chief Complaint  Patient presents with  . Anxiety  . Depression  . Stress    Reason For Service:     The patient was referred for psychotherapeutic interventions because of ongoing symptoms of depression, anxiety, and mood swings including anger outbursts particularly of a verbal nature.  Interventions Strategy:  Cognitive/behavioral psychotherapeutic interventions  Participation Level:   Active  Participation Quality:  Appropriate      Behavioral Observation:  Well Groomed, Alert, and Appropriate.   Current Psychosocial Factors: The patient reports that she has been spending 3-5 days with sister due to dementia in her sister.  The patient reports that this actually helps her getting out of apartment.  The patient says it is just dark in her apartment.  She is hoping to move out to go to a new apartment. her Content of Session:   Review current symptoms and continued work on therapeutic interventions for issues of recurrent depression and panic attacks.  Current Status:   The patient reports that she has been doing a little better with her mood but issues of depression continue.  She has been working on coping and dealing with severe COPD.  Helping with her sister has helped her depression.  Patient Progress:   The patient reports that she had started doing much worse.  She felt it was related to side effects of Rexulti and she stopped that medication on her own.  Target Goals:   Target goals have to do with reducing the intensity, duration, and frequency of depressive events as well as reducing the intensity, duration, and frequency  of panic attacks. Target goals include building coping skills and strategies around both of these diagnostic issues.  Last Reviewed:   12/13/2014  Goals Addressed Today:    Specific goals addressed today had to do with managing and coping with her cognitive and behavioral responses to stressors particularly around issues that may lead to full-blown panic attacks when she is in medical situations. These have been particular triggers for her in the past for panic attacks due to her medical illnesses and past medical treatments.  Impression/Diagnosis:   The patient has had a lot of significant medical issues to deal with particularly regarding pulmonary function. However, she also has a long history of depression and panic attacks have become exacerbated due to to persistent and serious medical issues.  Diagnosis:    Axis I:  Severe recurrent major depressive disorder with psychotic features  Panic disorder with agoraphobia and moderate panic attacks      Axis II: No diagnosis     Najia Hurlbutt R, PsyD 12/13/2014

## 2014-12-26 DIAGNOSIS — E1065 Type 1 diabetes mellitus with hyperglycemia: Secondary | ICD-10-CM | POA: Diagnosis not present

## 2014-12-26 DIAGNOSIS — E109 Type 1 diabetes mellitus without complications: Secondary | ICD-10-CM | POA: Diagnosis not present

## 2014-12-26 DIAGNOSIS — I10 Essential (primary) hypertension: Secondary | ICD-10-CM | POA: Diagnosis not present

## 2014-12-27 LAB — LIPID PANEL
CHOL/HDL RATIO: 2.5 ratio (ref <=5.0)
CHOLESTEROL: 111 mg/dL — AB (ref 125–200)
HDL: 45 mg/dL — ABNORMAL LOW (ref >=46)
LDL CALC: 52 mg/dL (ref <130)
TRIGLYCERIDES: 72 mg/dL (ref <150)
VLDL: 14 mg/dL (ref <30)

## 2014-12-27 LAB — COMPLETE METABOLIC PANEL WITH GFR
ALBUMIN: 3.7 g/dL (ref 3.6–5.1)
ALT: 6 U/L (ref 6–29)
AST: 14 U/L (ref 10–35)
Alkaline Phosphatase: 77 U/L (ref 33–130)
BUN: 22 mg/dL (ref 7–25)
CO2: 25 mEq/L (ref 20–31)
Calcium: 9.2 mg/dL (ref 8.6–10.4)
Chloride: 105 mEq/L (ref 98–110)
Creat: 1.18 mg/dL — ABNORMAL HIGH (ref 0.50–0.99)
GFR, EST NON AFRICAN AMERICAN: 50 mL/min — AB (ref >=60)
GFR, Est African American: 57 mL/min — ABNORMAL LOW (ref >=60)
Glucose, Bld: 142 mg/dL — ABNORMAL HIGH (ref 65–99)
POTASSIUM: 3.8 meq/L (ref 3.5–5.3)
Sodium: 141 mEq/L (ref 135–146)
Total Bilirubin: 0.4 mg/dL (ref 0.2–1.2)
Total Protein: 6.2 g/dL (ref 6.1–8.1)

## 2014-12-27 LAB — CBC
HCT: 40.9 % (ref 36.0–46.0)
Hemoglobin: 13.4 g/dL (ref 12.0–15.0)
MCH: 30 pg (ref 26.0–34.0)
MCHC: 32.8 g/dL (ref 30.0–36.0)
MCV: 91.7 fL (ref 78.0–100.0)
MPV: 9.2 fL (ref 8.6–12.4)
Platelets: 261 10*3/uL (ref 150–400)
RBC: 4.46 MIL/uL (ref 3.87–5.11)
RDW: 14.4 % (ref 11.5–15.5)
WBC: 7.9 10*3/uL (ref 4.0–10.5)

## 2014-12-27 LAB — MICROALBUMIN / CREATININE URINE RATIO
CREATININE, URINE: 422.2 mg/dL
Microalb Creat Ratio: 3.8 mg/g (ref 0.0–30.0)
Microalb, Ur: 1.6 mg/dL (ref <2.0)

## 2014-12-27 LAB — HEMOGLOBIN A1C
Hgb A1c MFr Bld: 7.3 % — ABNORMAL HIGH (ref <5.7)
MEAN PLASMA GLUCOSE: 163 mg/dL — AB (ref <117)

## 2014-12-28 ENCOUNTER — Encounter: Payer: Self-pay | Admitting: Family Medicine

## 2014-12-28 ENCOUNTER — Ambulatory Visit (INDEPENDENT_AMBULATORY_CARE_PROVIDER_SITE_OTHER): Payer: Medicare Other | Admitting: Family Medicine

## 2014-12-28 VITALS — BP 120/70 | HR 70 | Resp 18 | Ht 63.0 in | Wt 218.0 lb

## 2014-12-28 DIAGNOSIS — E038 Other specified hypothyroidism: Secondary | ICD-10-CM

## 2014-12-28 DIAGNOSIS — F329 Major depressive disorder, single episode, unspecified: Secondary | ICD-10-CM

## 2014-12-28 DIAGNOSIS — I10 Essential (primary) hypertension: Secondary | ICD-10-CM | POA: Diagnosis not present

## 2014-12-28 DIAGNOSIS — E119 Type 2 diabetes mellitus without complications: Secondary | ICD-10-CM

## 2014-12-28 DIAGNOSIS — F17200 Nicotine dependence, unspecified, uncomplicated: Secondary | ICD-10-CM

## 2014-12-28 DIAGNOSIS — F418 Other specified anxiety disorders: Secondary | ICD-10-CM

## 2014-12-28 DIAGNOSIS — M47899 Other spondylosis, site unspecified: Secondary | ICD-10-CM | POA: Diagnosis not present

## 2014-12-28 DIAGNOSIS — G4700 Insomnia, unspecified: Secondary | ICD-10-CM | POA: Diagnosis not present

## 2014-12-28 DIAGNOSIS — Z794 Long term (current) use of insulin: Secondary | ICD-10-CM

## 2014-12-28 DIAGNOSIS — E109 Type 1 diabetes mellitus without complications: Secondary | ICD-10-CM

## 2014-12-28 DIAGNOSIS — E785 Hyperlipidemia, unspecified: Secondary | ICD-10-CM

## 2014-12-28 DIAGNOSIS — F419 Anxiety disorder, unspecified: Secondary | ICD-10-CM

## 2014-12-28 DIAGNOSIS — F17210 Nicotine dependence, cigarettes, uncomplicated: Secondary | ICD-10-CM | POA: Diagnosis not present

## 2014-12-28 DIAGNOSIS — R928 Other abnormal and inconclusive findings on diagnostic imaging of breast: Secondary | ICD-10-CM

## 2014-12-28 DIAGNOSIS — G4719 Other hypersomnia: Secondary | ICD-10-CM | POA: Diagnosis not present

## 2014-12-28 DIAGNOSIS — IMO0001 Reserved for inherently not codable concepts without codable children: Secondary | ICD-10-CM

## 2014-12-28 DIAGNOSIS — F32A Depression, unspecified: Secondary | ICD-10-CM

## 2014-12-28 DIAGNOSIS — Z720 Tobacco use: Secondary | ICD-10-CM | POA: Diagnosis not present

## 2014-12-28 DIAGNOSIS — J9611 Chronic respiratory failure with hypoxia: Secondary | ICD-10-CM

## 2014-12-28 MED ORDER — MELOXICAM 7.5 MG PO TABS: 7.5000 mg | ORAL_TABLET | Freq: Every day | ORAL | Status: DC

## 2014-12-28 NOTE — Patient Instructions (Addendum)
F/u on August 22, call if you need me sooner, please bring all medication  Labs and blood pressure are excellent  Your medication is making you excessively sleepy and you do not need some of what you are taking  STOP clonidine and zanaflex, you do not have these anymore  New for back pain is meloxicam one daily, and ONLY ONE hyrocodone tablet once daily, starting today   For sleep the zolpidem dose is half as strong, break the 10 mg tablet in half and new when you get back will be 5 mg one at bedtime  I am concerned about your  Safety due to excessive sleepiness, arrange for a family member to come with you and drive you to next appt if able  Thanks for choosing [facility], we consider it a privelige to serve you.

## 2014-12-28 NOTE — Progress Notes (Signed)
Subjective:    Patient ID: [name], female    DOB: 1951-05-29, 64 y.o.   MRN: [id number]  HPI   [name]     MRN: [id number]      DOB: March 12, 1951   HPI [name] is here for follow up and re-evaluation of chronic medical conditions, medication management and review of any available recent lab and radiology data.  Preventive health is updated, specifically  Cancer screening and Immunization.   Questions or concerns regarding consultations or procedures which the PT has had in the interim are  Addressed.Question about recent mammogram and biopsy  Report reviewed with patient C/o excessive sleepiness and some forgetfulness Increased stress over sister's illness    There are no specific complaintsThere are no new concerns.   ROS Denies recent fever or chills. Denies sinus pressure, nasal congestion, ear pain or sore throat. Denies chest congestion, productive cough or wheezing. Denies chest pains, palpitations and leg swelling Denies abdominal pain, nausea, vomiting,diarrhea or constipation.   Denies dysuria, frequency, hesitancy or incontinence. Denies uncontrolled  joint pain, swelling and limitation in mobility. Denies headaches, seizures, numbness, or tingling. Increased   And , anxiety due to her sister's illness, reports excessive sleepiness Denies skin break down or rash.   PE  BP 120/70 mmHg  Pulse 70  Resp 18  Ht 5\' 3"  (1.6 m)  Wt 218 lb (98.884 kg)  BMI 38.63 kg/m2  SpO2 93%  Patient excessively sedated and oriented and in no cardiopulmonary distress.Maintained on supple,mental oxygen  HEENT: No facial asymmetry, EOMI,   oropharynx pink and moist.  Neck decreased ROM, no JVD, no mass.  Chest: Clear to auscultation bilaterally.decreased air entry throughout  CVS: S1, S2 no murmurs, no S3.Regular rate.  ABD: Soft non tender.   Ext: No edema  MS: Adequate though reduced  ROM spine, shoulders, hips and knees.  Skin: Intact, no ulcerations  or rash noted.  Psych: mildly  Anxious and  depressed appearing.  CNS: CN 2-12 intact, power,  normal throughout.no focal deficits noted.   Assessment & Plan   Excessive daytime sleepiness Witnessed excess sedation at visit, attempt to get family member to drive her home unsuccessful and met with resistance Dose reduction in hydrocodone, discontinued clonidine and muscle relaxant as likely major contributors and not needed Follow up with family member preferable in next 3 weeks  Essential hypertension Controlled, no change in medication DASH diet and commitment to daily physical activity for a minimum of 30 minutes discussed and encouraged, as a part of hypertension management. The importance of attaining a healthy weight is also discussed.  BP/Weight 12/28/2014 11/29/2014 07/04/2014 03/09/2014 12/15/2013 08/10/2013 04/07/2013  Systolic BP 120 114 126 102 126 118 118  Diastolic BP 70 78 76 62 68 74 82  Wt. (Lbs) 218 219 206 222.12 229 232 249.12  BMI 38.63 38.8 35.34 38.11 39.29 39.8 42.74  Some encounter information is confidential and restricted. Go to Review Flowsheets activity to see all data.        Chronic respiratory failure with hypoxia Stable on supplemental oxygen and current medication  Hypothyroidism Controlled, no change in medication   Hyperlipidemia LDL goal <100 Hyperlipidemia:Low fat diet discussed and encouraged.   Lipid Panel  Lab Results  Component Value Date   CHOL 111* 12/26/2014   HDL 45* 12/26/2014   LDLCALC 52 12/26/2014   TRIG 72 12/26/2014   CHOLHDL 2.5 12/26/2014   Controlled, no change in medication Needs to increase physical activity  Morbid obesity  Patient re-educated about  the importance of commitment to a  minimum of 150 minutes of exercise per week.  The importance of healthy food choices with portion control discussed. Encouraged to start a food diary, count calories and to consider  joining a support group. Sample diet  sheets offered. Goals set by the patient for the next several months.   Weight /BMI 12/28/2014 11/29/2014 07/04/2014  WEIGHT 218 lb 219 lb 206 lb  HEIGHT $Remov'5\' 3"'dtmWHc$  $Remove'5\' 3"'npoSkDH$  $RemoveB'5\' 4"'UhJAujmN$   BMI 38.63 kg/m2 38.8 kg/m2 35.34 kg/m2  Some encounter information is confidential and restricted. Go to Review Flowsheets activity to see all data.    Current exercise per week 30 minutes.   NICOTINE ADDICTION Unchanged Patient counseled for approximately 5 minutes regarding the health risks of ongoing nicotine use, specifically all types of cancer, heart disease, stroke and respiratory failure. The options available for help with cessation ,the behavioral changes to assist the process, and the option to either gradully reduce usage  Or abruptly stop.is also discussed. Pt is also encouraged to set specific goals in number of cigarettes used daily, as well as to set a quit date.  Number of cigarettes/cigars currently smoking daily:10 to 15, reports stressed over her sister's health   Anxiety and depression Followed by psychiatry and psychology. Not suicidal or homicidal, increased stress due to sister's health  Diabetes mellitus, insulin dependent (IDDM), controlled Controlled, no change in medication [name] is reminded of the importance of commitment to daily physical activity for 30 minutes or more, as able and the need to limit carbohydrate intake to 30 to 60 grams per meal to help with blood sugar control.   The need to take medication as prescribed, test blood sugar as directed, and to call between visits if there is a concern that blood sugar is uncontrolled is also discussed.   [name] is reminded of the importance of daily foot exam, annual eye examination, and good blood sugar, blood pressure and cholesterol control.  Diabetic Labs Latest Ref Rng 12/26/2014 09/05/2014 05/01/2014 12/14/2013 08/09/2013  HbA1c <5.7 % 7.3(H) 7.3(H) 7.0(H) 7.6(H) 8.1(H)  Microalbumin <2.0 mg/dL 1.6 - - 0.59 -    Micro/Creat Ratio 0.0 - 30.0 mg/g 3.8 - - 2.8 -  Chol 125 - 200 mg/dL 111(L) - 116 127 -  HDL >=46 mg/dL 45(L) - 42 50 -  Calc LDL <130 mg/dL 52 - 53 65 -  Triglycerides <150 mg/dL 72 - 103 60 -  Creatinine 0.50 - 0.99 mg/dL 1.18(H) 1.12(H) 1.13(H) 1.21(H) 0.93   BP/Weight 12/28/2014 11/29/2014 07/04/2014 03/09/2014 12/15/2013 08/10/2013 73/09/1935  Systolic BP 902 409 735 329 924 268 341  Diastolic BP 70 78 76 62 68 74 82  Wt. (Lbs) 218 219 206 222.12 229 232 249.12  BMI 38.63 38.8 35.34 38.11 39.29 39.8 42.74  Some encounter information is confidential and restricted. Go to Review Flowsheets activity to see all data.   Foot/eye exam completion dates 03/09/2014 11/18/2012  Foot exam Order - -  Foot Form Completion Done Done         Abnormal mammogram Biopsy of affected breast negative for malignancy, explained this to patient       Review of Systems     Objective:   Physical Exam        Assessment & Plan:

## 2014-12-30 ENCOUNTER — Telehealth: Payer: Self-pay | Admitting: Family Medicine

## 2014-12-30 DIAGNOSIS — G4719 Other hypersomnia: Secondary | ICD-10-CM | POA: Insufficient documentation

## 2014-12-30 DIAGNOSIS — R928 Other abnormal and inconclusive findings on diagnostic imaging of breast: Secondary | ICD-10-CM | POA: Insufficient documentation

## 2014-12-30 NOTE — Assessment & Plan Note (Signed)
Followed by psychiatry and psychology. Not suicidal or homicidal, increased stress due to sister's health

## 2014-12-30 NOTE — Assessment & Plan Note (Signed)
Unchanged Patient counseled for approximately 5 minutes regarding the health risks of ongoing nicotine use, specifically all types of cancer, heart disease, stroke and respiratory failure. The options available for help with cessation ,the behavioral changes to assist the process, and the option to either gradully reduce usage  Or abruptly stop.is also discussed. Pt is also encouraged to set specific goals in number of cigarettes used daily, as well as to set a quit date.  Number of cigarettes/cigars currently smoking daily:10 to 15, reports stressed over her sister's health

## 2014-12-30 NOTE — Assessment & Plan Note (Signed)
  Patient re-educated about  the importance of commitment to a  minimum of 150 minutes of exercise per week.  The importance of healthy food choices with portion control discussed. Encouraged to start a food diary, count calories and to consider  joining a support group. Sample diet sheets offered. Goals set by the patient for the next several months.   Weight /BMI 12/28/2014 11/29/2014 07/04/2014  WEIGHT 218 lb 219 lb 206 lb  HEIGHT '5\' 3"'$  '5\' 3"'$  '5\' 4"'$   BMI 38.63 kg/m2 38.8 kg/m2 35.34 kg/m2  Some encounter information is confidential and restricted. Go to Review Flowsheets activity to see all data.    Current exercise per week 30 minutes.

## 2014-12-30 NOTE — Telephone Encounter (Signed)
pls call pt , please let her know I am concerned about her , verify that she DOES have a family member to bring her to next appt, if not ask if OK to ask [place] to bring her , if she agrees, pls call and let Ms Joneen Caraway know, thanks

## 2014-12-30 NOTE — Assessment & Plan Note (Signed)
Controlled, no change in medication [name] is reminded of the importance of commitment to daily physical activity for 30 minutes or more, as able and the need to limit carbohydrate intake to 30 to 60 grams per meal to help with blood sugar control.   The need to take medication as prescribed, test blood sugar as directed, and to call between visits if there is a concern that blood sugar is uncontrolled is also discussed.   [name] is reminded of the importance of daily foot exam, annual eye examination, and good blood sugar, blood pressure and cholesterol control.  Diabetic Labs Latest Ref Rng 12/26/2014 09/05/2014 05/01/2014 12/14/2013 08/09/2013  HbA1c <5.7 % 7.3(H) 7.3(H) 7.0(H) 7.6(H) 8.1(H)  Microalbumin <2.0 mg/dL 1.6 - - 0.59 -  Micro/Creat Ratio 0.0 - 30.0 mg/g 3.8 - - 2.8 -  Chol 125 - 200 mg/dL 111(L) - 116 127 -  HDL >=46 mg/dL 45(L) - 42 50 -  Calc LDL <130 mg/dL 52 - 53 65 -  Triglycerides <150 mg/dL 72 - 103 60 -  Creatinine 0.50 - 0.99 mg/dL 1.18(H) 1.12(H) 1.13(H) 1.21(H) 0.93   BP/Weight 12/28/2014 11/29/2014 07/04/2014 03/09/2014 12/15/2013 08/10/2013 58/0/9983  Systolic BP 382 505 397 673 419 379 024  Diastolic BP 70 78 76 62 68 74 82  Wt. (Lbs) 218 219 206 222.12 229 232 249.12  BMI 38.63 38.8 35.34 38.11 39.29 39.8 42.74  Some encounter information is confidential and restricted. Go to Review Flowsheets activity to see all data.   Foot/eye exam completion dates 03/09/2014 11/18/2012  Foot exam Order - -  Foot Form Completion Done Done

## 2014-12-30 NOTE — Assessment & Plan Note (Signed)
Hyperlipidemia:Low fat diet discussed and encouraged.   Lipid Panel  Lab Results  Component Value Date   CHOL 111* 12/26/2014   HDL 45* 12/26/2014   LDLCALC 52 12/26/2014   TRIG 72 12/26/2014   CHOLHDL 2.5 12/26/2014   Controlled, no change in medication Needs to increase physical activity

## 2014-12-30 NOTE — Assessment & Plan Note (Signed)
Controlled, no change in medication  

## 2014-12-30 NOTE — Assessment & Plan Note (Signed)
Witnessed excess sedation at visit, attempt to get family member to drive her home unsuccessful and met with resistance Dose reduction in hydrocodone, discontinued clonidine and muscle relaxant as likely major contributors and not needed Follow up with family member preferable in next 3 weeks

## 2014-12-30 NOTE — Assessment & Plan Note (Signed)
Controlled, no change in medication DASH diet and commitment to daily physical activity for a minimum of 30 minutes discussed and encouraged, as a part of hypertension management. The importance of attaining a healthy weight is also discussed.  BP/Weight 12/28/2014 11/29/2014 07/04/2014 03/09/2014 12/15/2013 08/10/2013 22/02/7988  Systolic BP 211 941 740 814 481 856 314  Diastolic BP 70 78 76 62 68 74 82  Wt. (Lbs) 218 219 206 222.12 229 232 249.12  BMI 38.63 38.8 35.34 38.11 39.29 39.8 42.74  Some encounter information is confidential and restricted. Go to Review Flowsheets activity to see all data.

## 2014-12-30 NOTE — Assessment & Plan Note (Signed)
Biopsy of affected breast negative for malignancy, explained this to patient

## 2014-12-30 NOTE — Assessment & Plan Note (Signed)
Stable on supplemental oxygen and current medication

## 2015-01-01 ENCOUNTER — Telehealth: Payer: Self-pay | Admitting: *Deleted

## 2015-01-01 NOTE — Telephone Encounter (Signed)
Pt called LMOM for Brandi to call her so she can discuss her breast exam.

## 2015-01-01 NOTE — Telephone Encounter (Signed)
Patient aware.

## 2015-01-02 NOTE — Telephone Encounter (Signed)
Patient called asking if she can have muscle relaxer back.  Please advise.

## 2015-01-03 NOTE — Telephone Encounter (Signed)
I would rather hold off until she returns with meds and family member to see if less sedated.as this is a major concern, pls explain

## 2015-01-04 ENCOUNTER — Other Ambulatory Visit: Payer: Self-pay | Admitting: Family Medicine

## 2015-01-05 NOTE — Telephone Encounter (Signed)
Patient aware and agrees to discuss at 8/22 visit

## 2015-01-09 ENCOUNTER — Ambulatory Visit (INDEPENDENT_AMBULATORY_CARE_PROVIDER_SITE_OTHER): Payer: Medicare Other | Admitting: Psychiatry

## 2015-01-09 ENCOUNTER — Other Ambulatory Visit: Payer: Self-pay

## 2015-01-09 ENCOUNTER — Ambulatory Visit: Payer: Medicare Other

## 2015-01-09 ENCOUNTER — Encounter (HOSPITAL_COMMUNITY): Payer: Self-pay | Admitting: Psychiatry

## 2015-01-09 VITALS — BP 145/100 | HR 62 | Ht 63.0 in | Wt 209.8 lb

## 2015-01-09 VITALS — BP 160/82 | Wt 209.0 lb

## 2015-01-09 DIAGNOSIS — F333 Major depressive disorder, recurrent, severe with psychotic symptoms: Secondary | ICD-10-CM | POA: Diagnosis not present

## 2015-01-09 DIAGNOSIS — M47899 Other spondylosis, site unspecified: Secondary | ICD-10-CM

## 2015-01-09 DIAGNOSIS — I10 Essential (primary) hypertension: Secondary | ICD-10-CM

## 2015-01-09 MED ORDER — ALPRAZOLAM 1 MG PO TABS: ORAL_TABLET | ORAL | Status: DC

## 2015-01-09 MED ORDER — CLONIDINE HCL 0.2 MG PO TABS: 0.2000 mg | ORAL_TABLET | Freq: Every day | ORAL | Status: DC

## 2015-01-09 MED ORDER — HYDROCODONE-ACETAMINOPHEN 10-325 MG PO TABS: ORAL_TABLET | ORAL | Status: DC

## 2015-01-09 MED ORDER — FLUOXETINE HCL 20 MG PO CAPS: ORAL_CAPSULE | ORAL | Status: DC

## 2015-01-09 MED ORDER — CLONIDINE HCL 0.3 MG PO TABS: 0.3000 mg | ORAL_TABLET | Freq: Every day | ORAL | Status: DC

## 2015-01-09 NOTE — Progress Notes (Signed)
Patient ID: [name], female   DOB: 11/12/1950, 64 y.o.   MRN: [id number] Patient ID: [name], female   DOB: Feb 26, 1951, 64 y.o.   MRN: [id number] Patient ID: [name], female   DOB: [birth year], 64 y.o.   MRN: [id number] Patient ID: [name], female   DOB: March 21, 1951, 64 y.o.   MRN: [id number]  Psychiatric Assessment Adult  Patient Identification:  [name] Date of Evaluation:  01/09/2015 Chief Complaint: depression History of Chief Complaint:   Chief Complaint  Patient presents with  . Depression  . Anxiety  . Follow-up    Depression        Associated symptoms include fatigue and appetite change.  Past medical history includes anxiety.   Anxiety Symptoms include nervous/anxious behavior and shortness of breath.    this patient is a 64 year old widowed black female who lives alone in [place]. She has no children. She is on disability.  The patient was referred by Dr. Moshe Cipro her primary care physician and Dr. Jefm Miles her psychologist for further evaluation and treatment of major depression and anxiety.  The patient states that she's been depressed since her teen years. She's not really sure why. When she was in college she went through severe depression and had to quit. She's had this on and off through the years but really not receive treatment until a couple years ago after her husband died. Her primary care physician has put her on Prozac and Xanax which have helped to some degree.however recently she has been getting more depressed. She has severe COPD and is on oxygen. Yet, she continues to smoke THREE PACKS PER DAY which she claims is secondary to her depressed mood.  Recently she's gone through more deaths including her sister in 2014 a niece in 2014. Another sister was recently diagnosed with cancer. The patient has gotten overwhelmed by all this and claims it's getting hard for her to just get out of bed and function. She has no energy, she's  been shaky and having tremors in her hands, she complains of short-term memory loss.her sleep is variable and Ambien sometimes helps. Her appetite is up and down. Sometimes she hears the voices of her deceased relatives or "feels her spirits." She's never been to a psychiatric hospitalization  But has been seeing Dr. Jefm Miles in our office for several years. She has never been suicidal and states she is very "spiritual and values life. She stays away from people but she has a god niece and  God sister who check on her faithfully.she denies any other psychotic symptoms and does not use drugs or alcohol  The patient returns after 3 months. For the most part she is doing okay but she is helping to take care of her elderly sister who has dementia. She stays at night and sometimes she doesn't get much sleep. I suggested she only do this sporadically and not every night and she agrees. She states her mood is been stable and she has cut down her cigarettes to a half pack per day. The Xanax is helping with her anxiety. She denies suicidal ideation Review of Systems  Constitutional: Positive for activity change, appetite change and fatigue.  HENT: Negative.   Eyes: Negative.   Respiratory: Positive for shortness of breath.   Cardiovascular: Negative.   Gastrointestinal: Negative.   Endocrine: Negative.   Genitourinary: Negative.   Musculoskeletal: Positive for arthralgias.  Skin: Negative.   Allergic/Immunologic: Negative.   Neurological: Positive for tremors.  Hematological: Negative.   Psychiatric/Behavioral: Positive for depression, hallucinations, sleep disturbance and dysphoric mood. The patient is nervous/anxious.    Physical Examnot done  Depressive Symptoms: depressed mood, anhedonia, psychomotor retardation, difficulty concentrating, impaired memory, anxiety, disturbed sleep, decreased appetite,  (Hypo) Manic Symptoms:   Elevated Mood:  No Irritable Mood:  No Grandiosity:   No Distractibility:  No Labiality of Mood:  No Delusions:  No Hallucinations:  Yes Impulsivity:  Yes Sexually Inappropriate Behavior:  No Financial Extravagance:  No Flight of Ideas:  No  Anxiety Symptoms: Excessive Worry:  Yes Panic Symptoms:  No Agoraphobia:  No Obsessive Compulsive: No  Symptoms: None, Specific Phobias:  No Social Anxiety:  No  Psychotic Symptoms:  Hallucinations: Yes Auditory Delusions:  No Paranoia:  No   Ideas of Reference:  No  PTSD Symptoms: Ever had a traumatic exposure:  No Had a traumatic exposure in the last month:  No Re-experiencing: No None Hypervigilance:  No Hyperarousal: No None Avoidance: No None  Traumatic Brain Injury: No   Past Psychiatric History: Diagnosis:Maj. depression  Hospitalizations: none  Outpatient Care: has been seeing Dr. Jefm Miles in our office for counseling  Substance Abuse Care: none  Self-Mutilation: none  Suicidal Attempts:none  Violent Behaviors: none   Past Medical History:   Past Medical History  Diagnosis Date  . DJD (degenerative joint disease)     of the spine   . GERD (gastroesophageal reflux disease)   . Insomnia   . COPD (chronic obstructive pulmonary disease)   . Obesity   . Anxiety   . Hypothyroidism 2004    left thyroidectomy  . Diabetes mellitus, type 1 1983    type 2 diabetes  . Hypertension 1983  . Hyperlipidemia 2007  . Depression 2008   History of Loss of Consciousness:  No Seizure History:  No Cardiac History:  No Allergies:   Allergies  Allergen Reactions  . Codeine     REACTION: nausea   Current Medications:  Current Outpatient Prescriptions  Medication Sig Dispense Refill  . ADVAIR DISKUS 250-50 MCG/DOSE AEPB inhale 1 dose by mouth twice a day 60 each 2  . ALPRAZolam (XANAX) 1 MG tablet TAKE ONE TABLET FOUR TIMES A DAY 120 tablet 2  . aspirin (ASPIRIN LOW DOSE) 81 MG EC tablet Take 81 mg by mouth daily.      . B-D INS SYR ULTRAFINE 1CC/31G 31G X 5/16" 1 ML MISC  use as directed FOR ONCE DAILY TESTING 100 each 5  . calcium carbonate (TUMS) 500 MG chewable tablet Chew 1 tablet by mouth as needed.      . CRESTOR 20 MG tablet take 1 tablet by mouth once daily 30 tablet 2  . esomeprazole (NEXIUM) 40 MG capsule take 1 capsule by mouth once daily BEFORE BREAKFAST 30 capsule 4  . FLUoxetine (PROZAC) 20 MG capsule Take two in the am and one in the evening 90 capsule 2  . Foot Care Products (DIABETIC INSOLES) MISC by Does not apply route. And Shoes    . furosemide (LASIX) 40 MG tablet take 1 tablet by mouth once daily 30 tablet 4  . GLIPIZIDE XL 2.5 MG 24 hr tablet take 1 tablet by mouth once daily 30 tablet 2  . Glucose Blood (BAYER BREEZE 2 TEST) DISK by In Vitro route. Lancets and strips for four times a day testing     . guaiFENesin 100 MG/5ML SOLN Take 100 mg by mouth 4 (four) times daily. 15 ml by mouth 4 times  a day     . HYDROcodone-acetaminophen (NORCO) 10-325 MG per tablet One tablet once daily 30 tablet 0  . insulin glargine (LANTUS) 100 UNIT/ML injection 50 units once daily (Patient taking differently: 50 units once daily prn) 30 mL 3  . ipratropium-albuterol (DUONEB) 0.5-2.5 (3) MG/3ML SOLN Take 3 mLs by nebulization 4 (four) times daily as needed.     . levalbuterol (XOPENEX HFA) 45 MCG/ACT inhaler Inhale 1-2 puffs into the lungs as needed.      Marland Kitchen levothyroxine (SYNTHROID, LEVOTHROID) 100 MCG tablet Take 1 tablet (100 mcg total) by mouth daily. 30 tablet 4  . lisinopril-hydrochlorothiazide (PRINZIDE,ZESTORETIC) 20-12.5 MG per tablet take 1 tablet by mouth once daily 30 tablet 4  . meloxicam (MOBIC) 7.5 MG tablet Take 1 tablet (7.5 mg total) by mouth daily. 30 tablet 2  . montelukast (SINGULAIR) 10 MG tablet Take 1 tablet (10 mg total) by mouth at bedtime. 30 tablet 4  . potassium chloride 20 MEQ/15ML (10%) solution TAKE 1 AND 1/2 TEASPOONFUL BY MOUTH ONCE DAILY 240 mL 2  . zolpidem (AMBIEN) 5 MG tablet Take 1 tablet (5 mg total) by mouth at bedtime as  needed for sleep. 30 tablet 1   No current facility-administered medications for this visit.    Previous Psychotropic Medications:  Medication Dose   Wellbutrin-caused irritability                       Substance Abuse History in the last 12 months: Substance Age of 1st Use Last Use Amount Specific Type  Nicotine   Smokes 3 packs of cigarettes per day   Alcohol      Cannabis      Opiates      Cocaine      Methamphetamines      LSD      Ecstasy      Benzodiazepines      Caffeine      Inhalants      Others:                          Medical Consequences of Substance Abuse: smoking is making her COPD much worse  Legal Consequences of Substance Abuse: none  Family Consequences of Substance Abuse: none  Blackouts:  No DT's:  No Withdrawal Symptoms:  No None  Social History: Current Place of Residence: [place] of Birth: [place] Family Members: 2 sisters Marital Status:  Widowed Children: none   Relationships:  Education:  Dentist Problems/Performance:  Religious Beliefs/Practices: Christian History of Abuse: none Energy manager History:  None. Legal History: none Hobbies/Interests: listening to gospel music  Family History:   Family History  Problem Relation Age of Onset  . Hypertension Mother   . Stroke Mother   . Cancer Father     lung   . Kidney failure      family history   . Hypertension      family history   . Hypertension Sister   . Depression Sister   . Alcohol abuse Sister   . Hypertension Sister   . Depression Sister   . Diabetes Sister   . Depression Sister   . Cancer Sister     renal     Mental Status Examination/Evaluation: Objective:  Appearance: Casual and Fairly Groomed  using portable oxygen  Eye Contact::  Good  Speech:  Clear and Coherent  Volume:  Decreased  Mood:good  Affect:  Bright  Thought Process:  Circumstantial  Orientation:  Full  (Time, Place, and Person)  Thought Content:  Rumination   Suicidal Thoughts:  No  Homicidal Thoughts:  No  Judgement:  Fair  Insight:  Fair  Psychomotor Activity:  Decreased  Akathisia:  No  Handed:  Right  AIMS (if indicated):    Assets:  Communication Skills Desire for Improvement Social Support    Laboratory/X-Ray Psychological Evaluation(s)   reviewed in chart     Assessment:  Axis I: Major Depression, Recurrent severe  AXIS I Major Depression, Recurrent severe  AXIS II Deferred  AXIS III Past Medical History  Diagnosis Date  . DJD (degenerative joint disease)     of the spine   . GERD (gastroesophageal reflux disease)   . Insomnia   . COPD (chronic obstructive pulmonary disease)   . Obesity   . Anxiety   . Hypothyroidism 2004    left thyroidectomy  . Diabetes mellitus, type 1 1983    type 2 diabetes  . Hypertension 1983  . Hyperlipidemia 2007  . Depression 2008     AXIS IV problems related to social environment  AXIS V 41-50 serious symptoms   Treatment Plan/Recommendations:  Plan of Care: medication management  Laboratory:   Psychotherapy: she is seeing Dr. Jefm Miles here  Medications: she'll continue Prozac 60 mg daily for depression and Xanax 1 mg 4 times a day for anxiety   Routine PRN Medications:  No  Consultations:   Safety Concerns:  She denies thoughts or plans of self-harm  Other:she'll return in 3 months     Levonne Spiller, MD 8/9/20163:04 PM

## 2015-01-09 NOTE — Patient Instructions (Signed)
Clonidine 0.2 1 at bedtime per dr Moshe Cipro to be resumed, follow up 8/22

## 2015-01-11 ENCOUNTER — Ambulatory Visit (INDEPENDENT_AMBULATORY_CARE_PROVIDER_SITE_OTHER): Payer: Medicare Other | Admitting: Psychology

## 2015-01-11 DIAGNOSIS — F333 Major depressive disorder, recurrent, severe with psychotic symptoms: Secondary | ICD-10-CM | POA: Diagnosis not present

## 2015-01-11 DIAGNOSIS — F4001 Agoraphobia with panic disorder: Secondary | ICD-10-CM

## 2015-01-11 NOTE — Progress Notes (Signed)
     PROGRESS NOTE  Patient:  [name]   DOB: 01/25/1951  MR Number: [id number]  Location: [facility] [address] [place] [address] Dept: [phone]  Start: 4 PM End: 5 PM  Provider/Observer:     Edgardo Roys PSYD  Chief Complaint:      Chief Complaint  Patient presents with  . Anxiety  . Panic Attack  . Depression    Reason For Service:     The patient was referred for psychotherapeutic interventions because of ongoing symptoms of depression, anxiety, and mood swings including anger outbursts particularly of a verbal nature.  Interventions Strategy:  Cognitive/behavioral psychotherapeutic interventions  Participation Level:   Active  Participation Quality:  Appropriate      Behavioral Observation:  Well Groomed, Alert, and Appropriate.   Current Psychosocial Factors: The patient reports that she has been dealling with sister and the fact that niece is not going to let the patient's sister move in with niece but instead wanting the patient's sister to move to nursing home.  This has been happening at same time the patient has been finishing her DNR.  her Content of Session:   Review current symptoms and continued work on therapeutic interventions for issues of recurrent depression and panic attacks.  Current Status:   The patient reports that she has been doing a little better with her mood but issues of depression continue.  She has been working on coping and dealing with severe COPD.  Helping with her sister has helped her depression.  Heover, more stress lately has been hampering things.  Patient Progress:   The patient reports that she had started doing much worse.  She felt it was related to side effects of Rexulti and she stopped that medication on her own.  Target Goals:   Target goals have to do with reducing the intensity, duration, and frequency of  depressive events as well as reducing the intensity, duration, and frequency of panic attacks. Target goals include building coping skills and strategies around both of these diagnostic issues.  Last Reviewed:   01/11/2015  Goals Addressed Today:    Specific goals addressed today had to do with managing and coping with her cognitive and behavioral responses to stressors particularly around issues that may lead to full-blown panic attacks when she is in medical situations. These have been particular triggers for her in the past for panic attacks due to her medical illnesses and past medical treatments.  Impression/Diagnosis:   The patient has had a lot of significant medical issues to deal with particularly regarding pulmonary function. However, she also has a long history of depression and panic attacks have become exacerbated due to to persistent and serious medical issues.  Diagnosis:    Axis I:  Severe recurrent major depressive disorder with psychotic features  Panic disorder with agoraphobia and moderate panic attacks      Axis II: No diagnosis     RODENBOUGH,JOHN R, PsyD 01/11/2015

## 2015-01-16 ENCOUNTER — Ambulatory Visit (HOSPITAL_COMMUNITY): Payer: Self-pay | Admitting: Psychology

## 2015-01-22 ENCOUNTER — Ambulatory Visit (INDEPENDENT_AMBULATORY_CARE_PROVIDER_SITE_OTHER): Payer: Medicare Other | Admitting: Family Medicine

## 2015-01-22 ENCOUNTER — Encounter: Payer: Self-pay | Admitting: Family Medicine

## 2015-01-22 VITALS — BP 124/70 | HR 64 | Resp 18 | Ht 63.0 in | Wt 217.0 lb

## 2015-01-22 DIAGNOSIS — Z79899 Other long term (current) drug therapy: Secondary | ICD-10-CM

## 2015-01-22 DIAGNOSIS — F419 Anxiety disorder, unspecified: Secondary | ICD-10-CM

## 2015-01-22 DIAGNOSIS — J9611 Chronic respiratory failure with hypoxia: Secondary | ICD-10-CM

## 2015-01-22 DIAGNOSIS — Z720 Tobacco use: Secondary | ICD-10-CM

## 2015-01-22 DIAGNOSIS — I10 Essential (primary) hypertension: Secondary | ICD-10-CM | POA: Diagnosis not present

## 2015-01-22 DIAGNOSIS — Z794 Long term (current) use of insulin: Secondary | ICD-10-CM

## 2015-01-22 DIAGNOSIS — E785 Hyperlipidemia, unspecified: Secondary | ICD-10-CM

## 2015-01-22 DIAGNOSIS — E109 Type 1 diabetes mellitus without complications: Secondary | ICD-10-CM

## 2015-01-22 DIAGNOSIS — Z1159 Encounter for screening for other viral diseases: Secondary | ICD-10-CM

## 2015-01-22 DIAGNOSIS — IMO0001 Reserved for inherently not codable concepts without codable children: Secondary | ICD-10-CM

## 2015-01-22 DIAGNOSIS — F329 Major depressive disorder, single episode, unspecified: Secondary | ICD-10-CM

## 2015-01-22 DIAGNOSIS — F418 Other specified anxiety disorders: Secondary | ICD-10-CM

## 2015-01-22 DIAGNOSIS — F17200 Nicotine dependence, unspecified, uncomplicated: Secondary | ICD-10-CM

## 2015-01-22 DIAGNOSIS — G4719 Other hypersomnia: Secondary | ICD-10-CM

## 2015-01-22 DIAGNOSIS — E038 Other specified hypothyroidism: Secondary | ICD-10-CM

## 2015-01-22 DIAGNOSIS — E119 Type 2 diabetes mellitus without complications: Secondary | ICD-10-CM

## 2015-01-22 NOTE — Assessment & Plan Note (Signed)
Controlled, no change in medication  

## 2015-01-22 NOTE — Assessment & Plan Note (Signed)
Controlled, no change in medication DASH diet and commitment to daily physical activity for a minimum of 30 minutes discussed and encouraged, as a part of hypertension management. The importance of attaining a healthy weight is also discussed.  BP/Weight 01/22/2015 01/09/2015 12/28/2014 11/29/2014 07/04/2014 03/09/2014 8/87/1959  Systolic BP 747 185 501 [phone]  Diastolic BP 70 82 70 78 76 62 68  Wt. (Lbs) 217 209 218 219 206 222.12 229  BMI 38.45 37.03 38.63 38.8 35.34 38.11 39.29  Some encounter information is confidential and restricted. Go to Review Flowsheets activity to see all data.

## 2015-01-22 NOTE — Assessment & Plan Note (Signed)
Stable, no change in management

## 2015-01-22 NOTE — Assessment & Plan Note (Addendum)
Recent panic attack, states she has really poor relationships with her nieces in the past 10 years, sees therapist and psychiatrist

## 2015-01-22 NOTE — Patient Instructions (Addendum)
F/u in November  With foot exam,call if you need me before  Fasting lipid, cmp and EGFr, HBa1C, TSH, HIV and Hep screen in November  Please work on smoking cessation    Smoking Cessation Quitting smoking is important to your health and has many advantages. However, it is not always easy to quit since nicotine is a very addictive drug. Oftentimes, people try 3 times or more before being able to quit. This document explains the best ways for you to prepare to quit smoking. Quitting takes hard work and a lot of effort, but you can do it. ADVANTAGES OF QUITTING SMOKING  You will live longer, feel better, and live better.  Your body will feel the impact of quitting smoking almost immediately.  Within 20 minutes, blood pressure decreases. Your pulse returns to its normal level.  After 8 hours, carbon monoxide levels in the blood return to normal. Your oxygen level increases.  After 24 hours, the chance of having a heart attack starts to decrease. Your breath, hair, and body stop smelling like smoke.  After 48 hours, damaged nerve endings begin to recover. Your sense of taste and smell improve.  After 72 hours, the body is virtually free of nicotine. Your bronchial tubes relax and breathing becomes easier.  After 2 to 12 weeks, lungs can hold more air. Exercise becomes easier and circulation improves.  The risk of having a heart attack, stroke, cancer, or lung disease is greatly reduced.  After 1 year, the risk of coronary heart disease is cut in half.  After 5 years, the risk of stroke falls to the same as a nonsmoker.  After 10 years, the risk of lung cancer is cut in half and the risk of other cancers decreases significantly.  After 15 years, the risk of coronary heart disease drops, usually to the level of a nonsmoker.  If you are pregnant, quitting smoking will improve your chances of having a healthy baby.  The people you live with, especially any children, will be  healthier.  You will have extra money to spend on things other than cigarettes. QUESTIONS TO THINK ABOUT BEFORE ATTEMPTING TO QUIT You may want to talk about your answers with your health care provider.  Why do you want to quit?  If you tried to quit in the past, what helped and what did not?  What will be the most difficult situations for you after you quit? How will you plan to handle them?  Who can help you through the tough times? Your family? Friends? A health care provider?  What pleasures do you get from smoking? What ways can you still get pleasure if you quit? Here are some questions to ask your health care provider:  How can you help me to be successful at quitting?  What medicine do you think would be best for me and how should I take it?  What should I do if I need more help?  What is smoking withdrawal like? How can I get information on withdrawal? GET READY  Set a quit date.  Change your environment by getting rid of all cigarettes, ashtrays, matches, and lighters in your home, car, or work. Do not let people smoke in your home.  Review your past attempts to quit. Think about what worked and what did not. GET SUPPORT AND ENCOURAGEMENT You have a better chance of being successful if you have help. You can get support in many ways.  Tell your family, friends, and coworkers that  you are going to quit and need their support. Ask them not to smoke around you.  Get individual, group, or telephone counseling and support. Programs are available at [employer] and health centers. Call your local health department for information about programs in your area.  Spiritual beliefs and practices may help some smokers quit.  Download a "quit meter" on your computer to keep track of quit statistics, such as how long you have gone without smoking, cigarettes not smoked, and money saved.  Get a self-help book about quitting smoking and staying off tobacco. [place] yourself from urges to smoke. Talk to someone, go for a walk, or occupy your time with a task.  Change your normal routine. Take a different route to work. Drink tea instead of coffee. Eat breakfast in a different place.  Reduce your stress. Take a hot bath, exercise, or read a book.  Plan something enjoyable to do every day. Reward yourself for not smoking.  Explore interactive web-based programs that specialize in helping you quit. GET MEDICINE AND USE IT CORRECTLY Medicines can help you stop smoking and decrease the urge to smoke. Combining medicine with the above behavioral methods and support can greatly increase your chances of successfully quitting smoking.  Nicotine replacement therapy helps deliver nicotine to your body without the negative effects and risks of smoking. Nicotine replacement therapy includes nicotine gum, lozenges, inhalers, nasal sprays, and skin patches. Some may be available over-the-counter and others require a prescription.  Antidepressant medicine helps people abstain from smoking, but how this works is unknown. This medicine is available by prescription.  Nicotinic receptor partial agonist medicine simulates the effect of nicotine in your brain. This medicine is available by prescription. Ask your health care provider for advice about which medicines to use and how to use them based on your health history. Your health care provider will tell you what side effects to look out for if you choose to be on a medicine or therapy. Carefully read the information on the package. Do not use any other product containing nicotine while using a nicotine replacement product.  RELAPSE OR DIFFICULT SITUATIONS Most relapses occur within the first 3 months after quitting. Do not be discouraged if you start smoking again. Remember, most people try several times before finally quitting. You may have symptoms of withdrawal because your body is used to nicotine.  You may crave cigarettes, be irritable, feel very hungry, cough often, get headaches, or have difficulty concentrating. The withdrawal symptoms are only temporary. They are strongest when you first quit, but they will go away within 10-14 days. To reduce the chances of relapse, try to:  Avoid drinking alcohol. Drinking lowers your chances of successfully quitting.  Reduce the amount of caffeine you consume. Once you quit smoking, the amount of caffeine in your body increases and can give you symptoms, such as a rapid heartbeat, sweating, and anxiety.  Avoid smokers because they can make you want to smoke.  Do not let weight gain distract you. Many smokers will gain weight when they quit, usually less than 10 pounds. Eat a healthy diet and stay active. You can always lose the weight gained after you quit.  Find ways to improve your mood other than smoking. FOR MORE INFORMATION  www.smokefree.gov  Document Released: 05/13/2001 Document Revised: 10/03/2013 Document Reviewed: 08/28/2011 [facility] Patient Information 2015 [place], [place]. This information is not intended to replace advice given to you by your health care provider.  Make sure you discuss any questions you have with your health care provider. Smoking Cessation, Tips for Success If you are ready to quit smoking, congratulations! You have chosen to help yourself be healthier. Cigarettes bring nicotine, tar, carbon monoxide, and other irritants into your body. Your lungs, heart, and blood vessels will be able to work better without these poisons. There are many different ways to quit smoking. Nicotine gum, nicotine patches, a nicotine inhaler, or nicotine nasal spray can help with physical craving. Hypnosis, support groups, and medicines help break the habit of smoking. WHAT THINGS CAN I DO TO MAKE QUITTING EASIER?  Here are some tips to help you quit for good:  Pick a date when you will quit smoking completely. Tell all of your friends and  family about your plan to quit on that date.  Do not try to slowly cut down on the number of cigarettes you are smoking. Pick a quit date and quit smoking completely starting on that day.  Throw away all cigarettes.   Clean and remove all ashtrays from your home, work, and car.  On a card, write down your reasons for quitting. Carry the card with you and read it when you get the urge to smoke.  Cleanse your body of nicotine. Drink enough water and fluids to keep your urine clear or pale yellow. Do this after quitting to flush the nicotine from your body.  Learn to predict your moods. Do not let a bad situation be your excuse to have a cigarette. Some situations in your life might tempt you into wanting a cigarette.  Never have "just one" cigarette. It leads to wanting another and another. Remind yourself of your decision to quit.  Change habits associated with smoking. If you smoked while driving or when feeling stressed, try other activities to replace smoking. Stand up when drinking your coffee. Brush your teeth after eating. Sit in a different chair when you read the paper. Avoid alcohol while trying to quit, and try to drink fewer caffeinated beverages. Alcohol and caffeine may urge you to smoke.  Avoid foods and drinks that can trigger a desire to smoke, such as sugary or spicy foods and alcohol.  Ask people who smoke not to smoke around you.  Have something planned to do right after eating or having a cup of coffee. For example, plan to take a walk or exercise.  Try a relaxation exercise to calm you down and decrease your stress. Remember, you may be tense and nervous for the first 2 weeks after you quit, but this will pass.  Find new activities to keep your hands busy. Play with a pen, coin, or rubber band. Doodle or draw things on paper.  Brush your teeth right after eating. This will help cut down on the craving for the taste of tobacco after meals. You can also try mouthwash.    Use oral substitutes in place of cigarettes. Try using lemon drops, carrots, cinnamon sticks, or chewing gum. Keep them handy so they are available when you have the urge to smoke.  When you have the urge to smoke, try deep breathing.  Designate your home as a nonsmoking area.  If you are a heavy smoker, ask your health care provider about a prescription for nicotine chewing gum. It can ease your withdrawal from nicotine.  Reward yourself. Set aside the cigarette money you save and buy yourself something nice.  Look for support from others. Join a support group or smoking cessation  program. Ask someone at home or at work to help you with your plan to quit smoking.  Always ask yourself, "Do I need this cigarette or is this just a reflex?" Tell yourself, "Today, I choose not to smoke," or "I do not want to smoke." You are reminding yourself of your decision to quit.  Do not replace cigarette smoking with electronic cigarettes (commonly called e-cigarettes). The safety of e-cigarettes is unknown, and some may contain harmful chemicals.  If you relapse, do not give up! Plan ahead and think about what you will do the next time you get the urge to smoke. HOW WILL I FEEL WHEN I QUIT SMOKING? You may have symptoms of withdrawal because your body is used to nicotine (the addictive substance in cigarettes). You may crave cigarettes, be irritable, feel very hungry, cough often, get headaches, or have difficulty concentrating. The withdrawal symptoms are only temporary. They are strongest when you first quit but will go away within 10-14 days. When withdrawal symptoms occur, stay in control. Think about your reasons for quitting. Remind yourself that these are signs that your body is healing and getting used to being without cigarettes. Remember that withdrawal symptoms are easier to treat than the major diseases that smoking can cause.  Even after the withdrawal is over, expect periodic urges to smoke.  However, these cravings are generally short lived and will go away whether you smoke or not. Do not smoke! WHAT RESOURCES ARE AVAILABLE TO HELP ME QUIT SMOKING? Your health care provider can direct you to community resources or hospitals for support, which may include:  Group support.  Education.  Hypnosis.  Therapy. Document Released: 02/15/2004 Document Revised: 10/03/2013 Document Reviewed: 11/04/2012 [facility] Patient Information 2015 [place], [place]. This information is not intended to replace advice given to you by your health care provider. Make sure you discuss any questions you have with your health care provider.  Much improved on current medication mnagement

## 2015-01-22 NOTE — Assessment & Plan Note (Signed)

## 2015-01-26 ENCOUNTER — Other Ambulatory Visit: Payer: Self-pay

## 2015-01-26 DIAGNOSIS — M47899 Other spondylosis, site unspecified: Secondary | ICD-10-CM

## 2015-01-26 MED ORDER — HYDROCODONE-ACETAMINOPHEN 10-325 MG PO TABS: ORAL_TABLET | ORAL | Status: DC

## 2015-02-01 ENCOUNTER — Other Ambulatory Visit: Payer: Self-pay

## 2015-02-01 ENCOUNTER — Telehealth: Payer: Self-pay | Admitting: *Deleted

## 2015-02-01 ENCOUNTER — Other Ambulatory Visit: Payer: Self-pay | Admitting: Family Medicine

## 2015-02-01 DIAGNOSIS — G4700 Insomnia, unspecified: Secondary | ICD-10-CM

## 2015-02-01 MED ORDER — LISINOPRIL-HYDROCHLOROTHIAZIDE 20-12.5 MG PO TABS: 1.0000 | ORAL_TABLET | Freq: Every day | ORAL | Status: DC

## 2015-02-01 MED ORDER — ZOLPIDEM TARTRATE 5 MG PO TABS: 5.0000 mg | ORAL_TABLET | Freq: Every evening | ORAL | Status: DC | PRN

## 2015-02-01 NOTE — Telephone Encounter (Signed)
Medication refilled.  She will come and collect Hydrocodone on 9/2 in the am.

## 2015-02-01 NOTE — Telephone Encounter (Signed)
Pt called stating she needs xanax, hydrocodone, ambien, lisinopril to be refilled. [facility] on [address]

## 2015-02-02 ENCOUNTER — Other Ambulatory Visit: Payer: Self-pay | Admitting: Family Medicine

## 2015-02-06 ENCOUNTER — Other Ambulatory Visit: Payer: Self-pay

## 2015-02-06 ENCOUNTER — Telehealth: Payer: Self-pay | Admitting: *Deleted

## 2015-02-06 MED ORDER — FLUTICASONE-SALMETEROL 250-50 MCG/DOSE IN AEPB: INHALATION_SPRAY | RESPIRATORY_TRACT | Status: DC

## 2015-02-06 NOTE — Telephone Encounter (Signed)
meds called in.

## 2015-02-06 NOTE — Telephone Encounter (Signed)
Pt called [facility] stating [facility] told her Dr. Moshe Cipro did not approve her 3 medications advire, Ambien and 1 other.

## 2015-02-19 ENCOUNTER — Ambulatory Visit (HOSPITAL_COMMUNITY): Payer: Medicare Other | Admitting: Psychology

## 2015-02-25 DIAGNOSIS — Z79899 Other long term (current) drug therapy: Secondary | ICD-10-CM | POA: Insufficient documentation

## 2015-02-25 NOTE — Assessment & Plan Note (Signed)
lkess somnolent on current medicaton regime , continue same

## 2015-02-25 NOTE — Progress Notes (Signed)
Subjective:    Patient ID: [name], female    DOB: [birth year], 64 y.o.   MRN: [id number]  HPI Pt in for review following significant reduction in her medications which were acting together to make her excessively sleepy throughout the day. She actually states that she feels better, denies uncontrolled pain, denies difficulty with sleep at night. Feels more energized and engaged in life Denies polyuria, polydipsia, blurred vision , or hypoglycemic episodes.    Review of Systems See HPI Denies recent fever or chills. Denies sinus pressure, nasal congestion, ear pain or sore throat. Denies chest congestion, productive cough or wheezing.Chronic smoker's cough and oxygen dependent Denies chest pains, palpitations and leg swelling Denies abdominal pain, nausea, vomiting,diarrhea or constipation.   Denies dysuria, frequency, hesitancy or incontinence. Chronic  joint pain, swelling and limitation in mobility. Denies headaches, seizures, numbness, or tingling. Denies uncontrolled  depression, anxiety or insomnia. Denies skin break down or rash.         Objective:   Physical Exam BP 124/70 mmHg  Pulse 64  Resp 18  Ht '5\' 3"'$  (1.6 m)  Wt 217 lb (98.431 kg)  BMI 38.45 kg/m2  SpO2 97% Patient alert and oriented and in no cardiopulmonary distress.  HEENT: No facial asymmetry, EOMI,   oropharynx pink and moist.  Neck decreased though adequate ROM, no JVD, no mass.  Chest: scattered hig pitched wheezes, no crackles, adequate though reduced  air entry bilaterally  CVS: S1, S2 no murmurs, no S3.Regular rate.  ABD: Soft non tender.   Ext: No edema  MS: Adequate ROM spine, shoulders, hips and knees.  Skin: Intact, no ulcerations or rash noted.  Psych: Good eye contact, normal affect. Memory mildly impairedt not anxious or depressed appearing.  CNS: CN 2-12 intact, power,  normal throughout.no focal deficits noted.        Assessment & Plan:  Essential  hypertension Controlled, no change in medication DASH diet and commitment to daily physical activity for a minimum of 30 minutes discussed and encouraged, as a part of hypertension management. The importance of attaining a healthy weight is also discussed.  BP/Weight 01/22/2015 01/09/2015 12/28/2014 11/29/2014 07/04/2014 03/09/2014 8/78/6767  Systolic BP 209 470 962 [phone] 546  Diastolic BP 70 82 70 78 76 62 68  Wt. (Lbs) 217 209 218 219 206 222.12 229  BMI 38.45 37.03 38.63 38.8 35.34 38.11 39.29  Some encounter information is confidential and restricted. Go to Review Flowsheets activity to see all data.         Chronic respiratory failure with hypoxia Stable, no change in management  NICOTINE ADDICTION Patient counseled for approximately 5 minutes regarding the health risks of ongoing nicotine use, specifically all types of cancer, heart disease, stroke and respiratory failure. The options available for help with cessation ,the behavioral changes to assist the process, and the option to either gradully reduce usage  Or abruptly stop.is also discussed. Pt is also encouraged to set specific goals in number of cigarettes used daily, as well as to set a quit date.  Number of cigarettes/cigars currently smoking daily: 10   Hypothyroidism Controlled, no change in medication   Anxiety and depression Recent panic attack, states she has really poor relationships with her nieces in the past 10 years, sees therapist and psychiatrist  Excessive daytime sleepiness lkess somnolent on current medicaton regime , continue same  Encounter for medication management Marked improvement in level of alertness and pt reports improved ability to function on  lower doses of sedating medications, she will continue on current regime

## 2015-02-25 NOTE — Assessment & Plan Note (Signed)
Marked improvement in level of alertness and pt reports improved ability to function on lower doses of sedating medications, she will continue on current regime

## 2015-03-01 ENCOUNTER — Other Ambulatory Visit: Payer: Self-pay

## 2015-03-01 DIAGNOSIS — M47899 Other spondylosis, site unspecified: Secondary | ICD-10-CM

## 2015-03-01 MED ORDER — HYDROCODONE-ACETAMINOPHEN 10-325 MG PO TABS: ORAL_TABLET | ORAL | Status: DC

## 2015-03-04 ENCOUNTER — Other Ambulatory Visit: Payer: Self-pay | Admitting: Family Medicine

## 2015-03-07 ENCOUNTER — Telehealth: Payer: Self-pay | Admitting: *Deleted

## 2015-03-07 ENCOUNTER — Other Ambulatory Visit: Payer: Self-pay

## 2015-03-07 DIAGNOSIS — M47899 Other spondylosis, site unspecified: Secondary | ICD-10-CM

## 2015-03-07 MED ORDER — MELOXICAM 7.5 MG PO TABS: 7.5000 mg | ORAL_TABLET | Freq: Every day | ORAL | Status: DC

## 2015-03-07 NOTE — Telephone Encounter (Signed)
Patient called stating she needs her meloxicam called into [facility] in [place] on [address], patient stated she will stop by office today to get her hydrocodone

## 2015-03-07 NOTE — Telephone Encounter (Signed)
Meloxicam refilled

## 2015-03-19 ENCOUNTER — Ambulatory Visit (INDEPENDENT_AMBULATORY_CARE_PROVIDER_SITE_OTHER): Payer: Medicare Other | Admitting: Psychology

## 2015-03-19 ENCOUNTER — Telehealth (HOSPITAL_COMMUNITY): Payer: Self-pay | Admitting: *Deleted

## 2015-03-19 DIAGNOSIS — F4001 Agoraphobia with panic disorder: Secondary | ICD-10-CM | POA: Diagnosis not present

## 2015-03-19 DIAGNOSIS — F333 Major depressive disorder, recurrent, severe with psychotic symptoms: Secondary | ICD-10-CM | POA: Diagnosis not present

## 2015-03-19 NOTE — Telephone Encounter (Signed)
AFTER 10 OR 11 PLEASE USE [phone].   PATIENT SAID SHE DO NOT HAVE ANY MORE XANAX.

## 2015-03-20 NOTE — Telephone Encounter (Signed)
Called pt and informed her that per her chart, she should not be out of her Xanax until November. Per pt, the automated system called her to inform her that she did not have any refills. Informed pt to call the pharmacy and ask them if they have her Xanax on hold due to if she have more then one script they put one on hold until the first one is completed. Pt agreed to call pharmacy to see if they have any Xanax on hold for her.

## 2015-03-30 ENCOUNTER — Other Ambulatory Visit: Payer: Self-pay

## 2015-03-30 DIAGNOSIS — M47899 Other spondylosis, site unspecified: Secondary | ICD-10-CM

## 2015-03-30 MED ORDER — HYDROCODONE-ACETAMINOPHEN 10-325 MG PO TABS: ORAL_TABLET | ORAL | Status: DC

## 2015-04-07 ENCOUNTER — Other Ambulatory Visit: Payer: Self-pay | Admitting: Family Medicine

## 2015-04-09 ENCOUNTER — Other Ambulatory Visit: Payer: Self-pay

## 2015-04-09 ENCOUNTER — Telehealth (HOSPITAL_COMMUNITY): Payer: Self-pay | Admitting: *Deleted

## 2015-04-09 MED ORDER — ALPRAZOLAM 1 MG PO TABS: ORAL_TABLET | ORAL | Status: DC

## 2015-04-09 MED ORDER — FLUOXETINE HCL 20 MG PO CAPS: ORAL_CAPSULE | ORAL | Status: DC

## 2015-04-09 MED ORDER — LEVOTHYROXINE SODIUM 100 MCG PO TABS: 100.0000 ug | ORAL_TABLET | Freq: Every day | ORAL | Status: DC

## 2015-04-09 MED ORDER — GLIPIZIDE ER 2.5 MG PO TB24: 2.5000 mg | ORAL_TABLET | Freq: Every day | ORAL | Status: DC

## 2015-04-09 MED ORDER — MONTELUKAST SODIUM 10 MG PO TABS: 10.0000 mg | ORAL_TABLET | Freq: Every day | ORAL | Status: DC

## 2015-04-09 MED ORDER — CLONIDINE HCL 0.2 MG PO TABS: 0.2000 mg | ORAL_TABLET | Freq: Every day | ORAL | Status: DC

## 2015-04-09 MED ORDER — FLUTICASONE-SALMETEROL 250-50 MCG/DOSE IN AEPB: INHALATION_SPRAY | RESPIRATORY_TRACT | Status: AC

## 2015-04-09 MED ORDER — ROSUVASTATIN CALCIUM 20 MG PO TABS: 20.0000 mg | ORAL_TABLET | Freq: Every day | ORAL | Status: DC

## 2015-04-09 MED ORDER — ESOMEPRAZOLE MAGNESIUM 40 MG PO CPDR: DELAYED_RELEASE_CAPSULE | ORAL | Status: DC

## 2015-04-09 NOTE — Telephone Encounter (Signed)
Pt walked in stating she need refills for her Xanax and Prozac. Per pt she runs out today and her appt is sch for 04-11-15. Pt Xanax was last printed 01-09-15 with 2 refills and Prozac 01-09-15 with 2 refills. Pt number is [phone].

## 2015-04-09 NOTE — Telephone Encounter (Signed)
Per Dr. Harrington Challenger to call in refills for pt Xanax and Prozac. Called pharmacy and spoke with Aldona Bar and she stated she will get that ready for pt.

## 2015-04-09 NOTE — Telephone Encounter (Signed)
Called in pt medication to her pharmacy and spoke with [facility].

## 2015-04-09 NOTE — Telephone Encounter (Signed)
You may call in 30 days supply

## 2015-04-11 ENCOUNTER — Encounter (HOSPITAL_COMMUNITY): Payer: Self-pay | Admitting: Psychiatry

## 2015-04-11 ENCOUNTER — Ambulatory Visit (INDEPENDENT_AMBULATORY_CARE_PROVIDER_SITE_OTHER): Payer: Medicare Other | Admitting: Psychiatry

## 2015-04-11 VITALS — BP 108/69 | HR 63 | Ht 63.0 in | Wt 220.8 lb

## 2015-04-11 DIAGNOSIS — F333 Major depressive disorder, recurrent, severe with psychotic symptoms: Secondary | ICD-10-CM | POA: Diagnosis not present

## 2015-04-11 MED ORDER — ALPRAZOLAM 1 MG PO TABS: ORAL_TABLET | ORAL | Status: DC

## 2015-04-11 MED ORDER — FLUOXETINE HCL 20 MG PO CAPS: ORAL_CAPSULE | ORAL | Status: DC

## 2015-04-11 NOTE — Progress Notes (Signed)
Patient ID: [name], female   DOB: 02/08/1951, 64 y.o.   MRN: [id number] Patient ID: [name], female   DOB: 10/20/1950, 64 y.o.   MRN: [id number] Patient ID: [name], female   DOB: [birth year], 64 y.o.   MRN: [id number] Patient ID: [name], female   DOB: [birth year], 64 y.o.   MRN: [id number] Patient ID: [name], female   DOB: 07-16-1950, 64 y.o.   MRN: [id number]  Psychiatric Assessment Adult  Patient Identification:  [name] Date of Evaluation:  04/11/2015 Chief Complaint: depression History of Chief Complaint:   Chief Complaint  Patient presents with  . Depression  . Anxiety  . Follow-up    Depression        Associated symptoms include fatigue and appetite change.  Past medical history includes anxiety.   Anxiety Symptoms include nervous/anxious behavior and shortness of breath.    this patient is a 64 year old widowed black female who lives alone in [place]. She has no children. She is on disability.  The patient was referred by Dr. Moshe Cipro her primary care physician and Dr. Jefm Miles her psychologist for further evaluation and treatment of major depression and anxiety.  The patient states that she's been depressed since her teen years. She's not really sure why. When she was in college she went through severe depression and had to quit. She's had this on and off through the years but really not receive treatment until a couple years ago after her husband died. Her primary care physician has put her on Prozac and Xanax which have helped to some degree.however recently she has been getting more depressed. She has severe COPD and is on oxygen. Yet, she continues to smoke THREE PACKS PER DAY which she claims is secondary to her depressed mood.  Recently she's gone through more deaths including her sister in 2014 a niece in 2014. Another sister was recently diagnosed with cancer. The patient has gotten overwhelmed by all this and claims it's  getting hard for her to just get out of bed and function. She has no energy, she's been shaky and having tremors in her hands, she complains of short-term memory loss.her sleep is variable and Ambien sometimes helps. Her appetite is up and down. Sometimes she hears the voices of her deceased relatives or "feels her spirits." She's never been to a psychiatric hospitalization  But has been seeing Dr. Jefm Miles in our office for several years. She has never been suicidal and states she is very "spiritual and values life. She stays away from people but she has a god niece and  God sister who check on her faithfully.she denies any other psychotic symptoms and does not use drugs or alcohol  The patient returns after 3 months. For the most part she is doing okay. She continues to take care of her elderly sister but her sister is doing better and she is able to sleep at night when she stays with her. Her mood is generally fairly good especially if she is with somebody else. She feels more depressed when she is alone. She still think the Prozac is helpful and the Xanax has helped control her anxiety and panic attacks. She is smoking under one pack a day Review of Systems  Constitutional: Positive for activity change, appetite change and fatigue.  HENT: Negative.   Eyes: Negative.   Respiratory: Positive for shortness of breath.   Cardiovascular: Negative.   Gastrointestinal: Negative.   Endocrine: Negative.  Genitourinary: Negative.   Musculoskeletal: Positive for arthralgias.  Skin: Negative.   Allergic/Immunologic: Negative.   Neurological: Positive for tremors.  Hematological: Negative.   Psychiatric/Behavioral: Positive for depression, hallucinations, sleep disturbance and dysphoric mood. The patient is nervous/anxious.    Physical Examnot done  Depressive Symptoms: depressed mood, anhedonia, psychomotor retardation, difficulty concentrating, impaired memory, anxiety, disturbed  sleep, decreased appetite,  (Hypo) Manic Symptoms:   Elevated Mood:  No Irritable Mood:  No Grandiosity:  No Distractibility:  No Labiality of Mood:  No Delusions:  No Hallucinations:  Yes Impulsivity:  Yes Sexually Inappropriate Behavior:  No Financial Extravagance:  No Flight of Ideas:  No  Anxiety Symptoms: Excessive Worry:  Yes Panic Symptoms:  No Agoraphobia:  No Obsessive Compulsive: No  Symptoms: None, Specific Phobias:  No Social Anxiety:  No  Psychotic Symptoms:  Hallucinations: Yes Auditory Delusions:  No Paranoia:  No   Ideas of Reference:  No  PTSD Symptoms: Ever had a traumatic exposure:  No Had a traumatic exposure in the last month:  No Re-experiencing: No None Hypervigilance:  No Hyperarousal: No None Avoidance: No None  Traumatic Brain Injury: No   Past Psychiatric History: Diagnosis:Maj. depression  Hospitalizations: none  Outpatient Care: has been seeing Dr. Jefm Miles in our office for counseling  Substance Abuse Care: none  Self-Mutilation: none  Suicidal Attempts:none  Violent Behaviors: none   Past Medical History:   Past Medical History  Diagnosis Date  . DJD (degenerative joint disease)     of the spine   . GERD (gastroesophageal reflux disease)   . Insomnia   . COPD (chronic obstructive pulmonary disease) ([place])   . Obesity   . Anxiety   . Hypothyroidism 2004    left thyroidectomy  . Diabetes mellitus, type 1 1983    type 2 diabetes  . Hypertension 1983  . Hyperlipidemia 2007  . Depression 2008   History of Loss of Consciousness:  No Seizure History:  No Cardiac History:  No Allergies:   Allergies  Allergen Reactions  . Codeine     REACTION: nausea   Current Medications:  Current Outpatient Prescriptions  Medication Sig Dispense Refill  . ALPRAZolam (XANAX) 1 MG tablet TAKE ONE TABLET FOUR TIMES A DAY 120 tablet 2  . aspirin (ASPIRIN LOW DOSE) 81 MG EC tablet Take 81 mg by mouth daily.      . B-D INS SYR  ULTRAFINE 1CC/31G 31G X 5/16" 1 ML MISC use as directed FOR ONCE DAILY TESTING 100 each 5  . calcium carbonate (TUMS) 500 MG chewable tablet Chew 1 tablet by mouth as needed.      . cloNIDine (CATAPRES) 0.2 MG tablet Take 1 tablet (0.2 mg total) by mouth at bedtime. 30 tablet 4  . esomeprazole (NEXIUM) 40 MG capsule take 1 capsule by mouth once daily BEFORE BREAKFAST 30 capsule 4  . FLUoxetine (PROZAC) 20 MG capsule Take two in the am and one in the evening 90 capsule 2  . Fluticasone-Salmeterol (ADVAIR DISKUS) 250-50 MCG/DOSE AEPB inhale 1 dose by mouth twice a day 60 each 4  . Foot Care Products (DIABETIC INSOLES) MISC by Does not apply route. And Shoes    . furosemide (LASIX) 40 MG tablet take 1 tablet by mouth once daily 30 tablet 4  . glipiZIDE (GLIPIZIDE XL) 2.5 MG 24 hr tablet Take 1 tablet (2.5 mg total) by mouth daily. 30 tablet 4  . Glucose Blood (BAYER BREEZE 2 TEST) DISK by In Vitro route. Lancets and  strips for four times a day testing     . guaiFENesin 100 MG/5ML SOLN Take 100 mg by mouth 4 (four) times daily. 15 ml by mouth 4 times a day     . HYDROcodone-acetaminophen (NORCO) 10-325 MG tablet One tablet once daily 30 tablet 0  . ipratropium-albuterol (DUONEB) 0.5-2.5 (3) MG/3ML SOLN Take 3 mLs by nebulization 4 (four) times daily as needed.     . levalbuterol (XOPENEX HFA) 45 MCG/ACT inhaler Inhale 1-2 puffs into the lungs as needed.      Marland Kitchen levothyroxine (SYNTHROID, LEVOTHROID) 100 MCG tablet Take 1 tablet (100 mcg total) by mouth daily. 30 tablet 4  . lisinopril-hydrochlorothiazide (PRINZIDE,ZESTORETIC) 20-12.5 MG per tablet Take 1 tablet by mouth daily. 30 tablet 4  . meloxicam (MOBIC) 7.5 MG tablet Take 1 tablet (7.5 mg total) by mouth daily. 30 tablet 2  . montelukast (SINGULAIR) 10 MG tablet Take 1 tablet (10 mg total) by mouth at bedtime. 30 tablet 4  . potassium chloride 20 MEQ/15ML (10%) solution TAKE 1 AND 1/2 TEASPOONFUL BY MOUTH ONCE DAILY 240 mL 2  . rosuvastatin  (CRESTOR) 20 MG tablet Take 1 tablet (20 mg total) by mouth daily. 30 tablet 4  . zolpidem (AMBIEN) 5 MG tablet Take 1 tablet (5 mg total) by mouth at bedtime as needed for sleep. 30 tablet 1  . insulin glargine (LANTUS) 100 UNIT/ML injection 50 units once daily (Patient taking differently: 50 units once daily prn) 30 mL 3   No current facility-administered medications for this visit.    Previous Psychotropic Medications:  Medication Dose   Wellbutrin-caused irritability                       Substance Abuse History in the last 12 months: Substance Age of 1st Use Last Use Amount Specific Type  Nicotine   Smokes 3 packs of cigarettes per day   Alcohol      Cannabis      Opiates      Cocaine      Methamphetamines      LSD      Ecstasy      Benzodiazepines      Caffeine      Inhalants      Others:                          Medical Consequences of Substance Abuse: smoking is making her COPD much worse  Legal Consequences of Substance Abuse: none  Family Consequences of Substance Abuse: none  Blackouts:  No DT's:  No Withdrawal Symptoms:  No None  Social History: Current Place of Residence: [place] of Birth: [place] Family Members: 2 sisters Marital Status:  Widowed Children: none   Relationships:  Education:  Dentist Problems/Performance:  Religious Beliefs/Practices: Christian History of Abuse: none Energy manager History:  None. Legal History: none Hobbies/Interests: listening to gospel music  Family History:   Family History  Problem Relation Age of Onset  . Hypertension Mother   . Stroke Mother   . Cancer Father     lung   . Kidney failure      family history   . Hypertension      family history   . Hypertension Sister   . Depression Sister   . Alcohol abuse Sister   . Hypertension Sister   . Depression Sister   . Diabetes Sister   .  Depression Sister   . Cancer  Sister     renal     Mental Status Examination/Evaluation: Objective:  Appearance: Casual and Fairly Groomed  using portable oxygen  Eye Contact::  Good  Speech:  Clear and Coherent  Volume:  Decreased  Mood:good  Affect:  Bright  Thought Process:  Circumstantial  Orientation:  Full (Time, Place, and Person)  Thought Content:  Rumination   Suicidal Thoughts:  No  Homicidal Thoughts:  No  Judgement:  Fair  Insight:  Fair  Psychomotor Activity:  Decreased  Akathisia:  No  Handed:  Right  AIMS (if indicated):    Assets:  Communication Skills Desire for Improvement Social Support    Laboratory/X-Ray Psychological Evaluation(s)   reviewed in chart     Assessment:  Axis I: Major Depression, Recurrent severe  AXIS I Major Depression, Recurrent severe  AXIS II Deferred  AXIS III Past Medical History  Diagnosis Date  . DJD (degenerative joint disease)     of the spine   . GERD (gastroesophageal reflux disease)   . Insomnia   . COPD (chronic obstructive pulmonary disease) ([facility])   . Obesity   . Anxiety   . Hypothyroidism 2004    left thyroidectomy  . Diabetes mellitus, type 1 1983    type 2 diabetes  . Hypertension 1983  . Hyperlipidemia 2007  . Depression 2008     AXIS IV problems related to social environment  AXIS V 41-50 serious symptoms   Treatment Plan/Recommendations:  Plan of Care: medication management  Laboratory:   Psychotherapy: she is seeing Dr. Jefm Miles here  Medications: she'll continue Prozac 60 mg daily for depression and Xanax 1 mg 4 times a day for anxiety   Routine PRN Medications:  No  Consultations:   Safety Concerns:  She denies thoughts or plans of self-harm  Other:she'll return in 3 months     Levonne Spiller, MD 11/9/20162:29 PM

## 2015-04-16 ENCOUNTER — Ambulatory Visit (INDEPENDENT_AMBULATORY_CARE_PROVIDER_SITE_OTHER): Payer: Medicare Other | Admitting: Psychology

## 2015-04-16 DIAGNOSIS — F333 Major depressive disorder, recurrent, severe with psychotic symptoms: Secondary | ICD-10-CM

## 2015-04-16 DIAGNOSIS — F4001 Agoraphobia with panic disorder: Secondary | ICD-10-CM | POA: Diagnosis not present

## 2015-04-17 ENCOUNTER — Ambulatory Visit: Payer: Self-pay | Admitting: Family Medicine

## 2015-04-17 ENCOUNTER — Encounter: Payer: Self-pay | Admitting: Family Medicine

## 2015-04-23 ENCOUNTER — Encounter (HOSPITAL_COMMUNITY): Payer: Self-pay | Admitting: Psychology

## 2015-04-23 NOTE — Progress Notes (Signed)
      PROGRESS NOTE  Patient:  [name]   DOB: 1951/03/13  MR Number: [id number]  Location: [facility] [address] [place] [address] Dept: [phone]  Start: 4 PM End: 5 PM  Provider/Observer:     Edgardo Roys PSYD  Chief Complaint:      Chief Complaint  Patient presents with  . Anxiety  . Depression  . Stress    Reason For Service:     The patient was referred for psychotherapeutic interventions because of ongoing symptoms of depression, anxiety, and mood swings including anger outbursts particularly of a verbal nature.  Interventions Strategy:  Cognitive/behavioral psychotherapeutic interventions  Participation Level:   Active  Participation Quality:  Appropriate      Behavioral Observation:  Well Groomed, Alert, and Appropriate.   Current Psychosocial Factors: The patient reports that she has been dealling with sister and the fact that niece is not going to let the patient's sister move in with niece but instead wanting the patient's sister to move to nursing home.  This has been happening at same time the patient has been finishing her DNR.  her Content of Session:   Review current symptoms and continued work on therapeutic interventions for issues of recurrent depression and panic attacks.  Current Status:   The patient reports that she has been doing a little better with her mood but issues of depression continue.  She has been working on coping and dealing with severe COPD.  Helping with her sister has helped her depression.  Heover, more stress lately has been hampering things.  Patient Progress:   The patient reports that she had started doing much worse.  She felt it was related to side effects of Rexulti and she stopped that medication on her own.  Target Goals:   Target goals have to do with reducing the intensity, duration, and frequency of depressive  events as well as reducing the intensity, duration, and frequency of panic attacks. Target goals include building coping skills and strategies around both of these diagnostic issues.  Last Reviewed:   01/11/2015  Goals Addressed Today:    Specific goals addressed today had to do with managing and coping with her cognitive and behavioral responses to stressors particularly around issues that may lead to full-blown panic attacks when she is in medical situations. These have been particular triggers for her in the past for panic attacks due to her medical illnesses and past medical treatments.  Impression/Diagnosis:   The patient has had a lot of significant medical issues to deal with particularly regarding pulmonary function. However, she also has a long history of depression and panic attacks have become exacerbated due to to persistent and serious medical issues.  Diagnosis:    Axis I:  Severe recurrent major depressive disorder with psychotic features (HCC)  Panic disorder with agoraphobia and moderate panic attacks      Axis II: No diagnosis     Lonnetta Kniskern R, PsyD 04/23/2015

## 2015-04-23 NOTE — Progress Notes (Signed)
     PROGRESS NOTE  Patient:  [name]   DOB: February 12, 1951  MR Number: [id number]  Location: [facility] [address] [place] [address] Dept: [phone]  Start: 4 PM End: 5 PM  Provider/Observer:     Edgardo Roys PSYD  Chief Complaint:      Chief Complaint  Patient presents with  . Anxiety  . Depression  . Stress    Reason For Service:     The patient was referred for psychotherapeutic interventions because of ongoing symptoms of depression, anxiety, and mood swings including anger outbursts particularly of a verbal nature.  Interventions Strategy:  Cognitive/behavioral psychotherapeutic interventions  Participation Level:   Active  Participation Quality:  Appropriate      Behavioral Observation:  Well Groomed, Alert, and Appropriate.   Current Psychosocial Factors: The patient reports that she is doing better with her neices around issues with her sister..  her Content of Session:   Review current symptoms and continued work on therapeutic interventions for issues of recurrent depression and panic attacks.  Current Status:   The patient reports that she has been doing a little better with her mood but issues of depression continue.  She has been working on coping and dealing with severe COPD.  Helping with her sister has helped her depression.  Heover, more stress lately has been hampering things.  Patient Progress:   The patient reports that she had started doing much worse.  She felt it was related to side effects of Rexulti and she stopped that medication on her own.  Target Goals:   Target goals have to do with reducing the intensity, duration, and frequency of depressive events as well as reducing the intensity, duration, and frequency of panic attacks. Target goals include building coping skills and strategies around both of these diagnostic issues.  Last  Reviewed:   03/19/2015  Goals Addressed Today:    Specific goals addressed today had to do with managing and coping with her cognitive and behavioral responses to stressors particularly around issues that may lead to full-blown panic attacks when she is in medical situations. These have been particular triggers for her in the past for panic attacks due to her medical illnesses and past medical treatments.  Impression/Diagnosis:   The patient has had a lot of significant medical issues to deal with particularly regarding pulmonary function. However, she also has a long history of depression and panic attacks have become exacerbated due to to persistent and serious medical issues.  Diagnosis:    Axis I:  Severe recurrent major depressive disorder with psychotic features (HCC)  Panic disorder with agoraphobia and moderate panic attacks      Axis II: No diagnosis     Lott Seelbach R, PsyD 04/23/2015

## 2015-05-04 ENCOUNTER — Other Ambulatory Visit: Payer: Self-pay

## 2015-05-04 DIAGNOSIS — M47899 Other spondylosis, site unspecified: Secondary | ICD-10-CM

## 2015-05-04 MED ORDER — HYDROCODONE-ACETAMINOPHEN 10-325 MG PO TABS
ORAL_TABLET | ORAL | Status: DC
Start: 2015-05-04 — End: 2015-06-01

## 2015-05-07 ENCOUNTER — Telehealth: Payer: Self-pay | Admitting: Family Medicine

## 2015-05-07 NOTE — Telephone Encounter (Signed)
Med is ready, called patient to let her know and no answer

## 2015-05-07 NOTE — Telephone Encounter (Signed)
Wants to come by today and pick up medication HYDROcodone-acetaminophen (NORCO) 10-325 MG tablet

## 2015-05-29 ENCOUNTER — Encounter (HOSPITAL_COMMUNITY): Payer: Self-pay | Admitting: Psychology

## 2015-05-29 ENCOUNTER — Ambulatory Visit (HOSPITAL_COMMUNITY): Payer: Self-pay | Admitting: Psychology

## 2015-06-01 ENCOUNTER — Other Ambulatory Visit: Payer: Self-pay

## 2015-06-01 DIAGNOSIS — M47899 Other spondylosis, site unspecified: Secondary | ICD-10-CM

## 2015-06-01 MED ORDER — HYDROCODONE-ACETAMINOPHEN 10-325 MG PO TABS: ORAL_TABLET | ORAL | Status: DC

## 2015-06-04 ENCOUNTER — Other Ambulatory Visit: Payer: Self-pay | Admitting: Family Medicine

## 2015-06-18 ENCOUNTER — Encounter: Payer: Self-pay | Admitting: Family Medicine

## 2015-06-18 ENCOUNTER — Ambulatory Visit (INDEPENDENT_AMBULATORY_CARE_PROVIDER_SITE_OTHER): Payer: Medicare Other | Admitting: Family Medicine

## 2015-06-18 VITALS — BP 130/80 | HR 87 | Resp 16 | Ht 63.0 in | Wt 226.0 lb

## 2015-06-18 DIAGNOSIS — E038 Other specified hypothyroidism: Secondary | ICD-10-CM

## 2015-06-18 DIAGNOSIS — I10 Essential (primary) hypertension: Secondary | ICD-10-CM

## 2015-06-18 DIAGNOSIS — J449 Chronic obstructive pulmonary disease, unspecified: Secondary | ICD-10-CM

## 2015-06-18 DIAGNOSIS — Z794 Long term (current) use of insulin: Secondary | ICD-10-CM | POA: Diagnosis not present

## 2015-06-18 DIAGNOSIS — F17200 Nicotine dependence, unspecified, uncomplicated: Secondary | ICD-10-CM | POA: Diagnosis not present

## 2015-06-18 DIAGNOSIS — F32A Depression, unspecified: Secondary | ICD-10-CM

## 2015-06-18 DIAGNOSIS — G4700 Insomnia, unspecified: Secondary | ICD-10-CM

## 2015-06-18 DIAGNOSIS — F329 Major depressive disorder, single episode, unspecified: Secondary | ICD-10-CM

## 2015-06-18 DIAGNOSIS — F418 Other specified anxiety disorders: Secondary | ICD-10-CM

## 2015-06-18 DIAGNOSIS — E559 Vitamin D deficiency, unspecified: Secondary | ICD-10-CM

## 2015-06-18 DIAGNOSIS — Z23 Encounter for immunization: Secondary | ICD-10-CM

## 2015-06-18 DIAGNOSIS — F419 Anxiety disorder, unspecified: Secondary | ICD-10-CM

## 2015-06-18 DIAGNOSIS — E119 Type 2 diabetes mellitus without complications: Secondary | ICD-10-CM

## 2015-06-18 DIAGNOSIS — Z66 Do not resuscitate: Secondary | ICD-10-CM

## 2015-06-18 DIAGNOSIS — E785 Hyperlipidemia, unspecified: Secondary | ICD-10-CM | POA: Diagnosis not present

## 2015-06-18 DIAGNOSIS — IMO0001 Reserved for inherently not codable concepts without codable children: Secondary | ICD-10-CM

## 2015-06-18 MED ORDER — CLOTRIMAZOLE-BETAMETHASONE 1-0.05 % EX CREA: 1.0000 "application " | TOPICAL_CREAM | Freq: Two times a day (BID) | CUTANEOUS | Status: DC

## 2015-06-18 MED ORDER — NYSTATIN 100000 UNIT/GM EX POWD: CUTANEOUS | Status: DC

## 2015-06-18 NOTE — Assessment & Plan Note (Addendum)
Pt in today requesting a change on the MOST form that she had signed in 2016. Specific change is that she wants NO CPR and is adamant about this. She is willing to be transported to the hospital, have limited access to IV fluids and antibiotics , and  States no tube feeding. States she is "ready t to go home once it is her time" States her COPD is worsening and when she recently spoke with her pulmonary Doc during a flare states she told him she wants no CPR Of note she has been cutting back on smoking , states with deterioration in lungs she "has no choice" Will need to further address with responsible family member present and change to a yellow DNR form to be kept at her home Will also address [facility] case worker to discuss with her sand review in detail

## 2015-06-18 NOTE — Patient Instructions (Addendum)
F/u in 3.5 month, call if you need me sooner  HBA1C, cmp and EGFR, lipid, TSH and Vit D today  Foot exam today shows reduced sensation, vital that you examine feet daily  Flu vaccine today  Remain with Dr Harrington Challenger for psych treatment  Form changed as we discussed , new form today  Need eye exam!  Work on stopping smoking, good that you are at 7 , you CAN quit!  You Can Quit Smoking If you are ready to quit smoking or are thinking about it, congratulations! You have chosen to help yourself be healthier and live longer! There are lots of different ways to quit smoking. Nicotine gum, nicotine patches, a nicotine inhaler, or nicotine nasal spray can help with physical craving. Hypnosis, support groups, and medicines help break the habit of smoking. TIPS TO GET OFF AND STAY OFF CIGARETTES  Learn to predict your moods. Do not let a bad situation be your excuse to have a cigarette. Some situations in your life might tempt you to have a cigarette.  Ask friends and co-workers not to smoke around you.  Make your home smoke-free.  Never have "just one" cigarette. It leads to wanting another and another. Remind yourself of your decision to quit.  On a card, make a list of your reasons for not smoking. Read it at least the same number of times a day as you have a cigarette. Tell yourself everyday, "I do not want to smoke. I choose not to smoke."  Ask someone at home or work to help you with your plan to quit smoking.  Have something planned after you eat or have a cup of coffee. Take a walk or get other exercise to perk you up. This will help to keep you from overeating.  Try a relaxation exercise to calm you down and decrease your stress. Remember, you may be tense and nervous the first two weeks after you quit. This will pass.  Find new activities to keep your hands busy. Play with a pen, coin, or rubber band. Doodle or draw things on paper.  Brush your teeth right after eating. This will help  cut down the craving for the taste of tobacco after meals. You can try mouthwash too.  Try gum, breath mints, or diet candy to keep something in your mouth. IF YOU SMOKE AND WANT TO QUIT:  Do not stock up on cigarettes. Never buy a carton. Wait until one pack is finished before you buy another.  Never carry cigarettes with you at work or at home.  Keep cigarettes as far away from you as possible. Leave them with someone else.  Never carry matches or a lighter with you.  Ask yourself, "Do I need this cigarette or is this just a reflex?"  Bet with someone that you can quit. Put cigarette money in a piggy bank every morning. If you smoke, you give up the money. If you do not smoke, by the end of the week, you keep the money.  Keep trying. It takes 21 days to change a habit!  Talk to your doctor about using medicines to help you quit. These include nicotine replacement gum, lozenges, or skin patches.   This information is not intended to replace advice given to you by your health care provider. Make sure you discuss any questions you have with your health care provider.   Document Released: 03/15/2009 Document Revised: 08/11/2011 Document Reviewed: 03/15/2009 Elsevier Interactive Patient Education [employer].

## 2015-06-18 NOTE — Assessment & Plan Note (Signed)
Controlled, no change in medication DASH diet and commitment to daily physical activity for a minimum of 30 minutes discussed and encouraged, as a part of hypertension management. The importance of attaining a healthy weight is also discussed.  BP/Weight 06/18/2015 01/22/2015 01/09/2015 12/28/2014 11/29/2014 07/04/2014 12/01/2195  Systolic BP 588 325 498 264 158 309 407  Diastolic BP 80 70 82 70 78 76 62  Wt. (Lbs) 226 217 209 218 219 206 222.12  BMI 40.04 38.45 37.03 38.63 38.8 35.34 38.11  Some encounter information is confidential and restricted. Go to Review Flowsheets activity to see all data.

## 2015-06-18 NOTE — Assessment & Plan Note (Signed)
Updated lab needed at/ before next visit. Ms. Rowen is reminded of the importance of commitment to daily physical activity for 30 minutes or more, as able and the need to limit carbohydrate intake to 30 to 60 grams per meal to help with blood sugar control.   The need to take medication as prescribed, test blood sugar as directed, and to call between visits if there is a concern that blood sugar is uncontrolled is also discussed.   Ms. Lovering is reminded of the importance of daily foot exam, annual eye examination, and good blood sugar, blood pressure and cholesterol control.  Diabetic Labs Latest Ref Rng 12/26/2014 09/05/2014 05/01/2014 12/14/2013 08/09/2013  HbA1c <5.7 % 7.3(H) 7.3(H) 7.0(H) 7.6(H) 8.1(H)  Microalbumin <2.0 mg/dL 1.6 - - 0.59 -  Micro/Creat Ratio 0.0 - 30.0 mg/g 3.8 - - 2.8 -  Chol 125 - 200 mg/dL 111(L) - 116 127 -  HDL >=46 mg/dL 45(L) - 42 50 -  Calc LDL <130 mg/dL 52 - 53 65 -  Triglycerides <150 mg/dL 72 - 103 60 -  Creatinine 0.50 - 0.99 mg/dL 1.18(H) 1.12(H) 1.13(H) 1.21(H) 0.93   BP/Weight 06/18/2015 01/22/2015 01/09/2015 12/28/2014 11/29/2014 07/04/2014 84/12/2070  Systolic BP 182 883 374 451 460 479 987  Diastolic BP 80 70 82 70 78 76 62  Wt. (Lbs) 226 217 209 218 219 206 222.12  BMI 40.04 38.45 37.03 38.63 38.8 35.34 38.11  Some encounter information is confidential and restricted. Go to Review Flowsheets activity to see all data.   Foot/eye exam completion dates 06/18/2015 03/09/2014  Foot exam Order - -  Foot Form Completion Done Done

## 2015-06-18 NOTE — Assessment & Plan Note (Signed)

## 2015-06-18 NOTE — Assessment & Plan Note (Signed)
Hyperlipidemia:Low fat diet discussed and encouraged.   Lipid Panel  Lab Results  Component Value Date   CHOL 111* 12/26/2014   HDL 45* 12/26/2014   LDLCALC 52 12/26/2014   TRIG 72 12/26/2014   CHOLHDL 2.5 12/26/2014   Updated lab needed

## 2015-06-18 NOTE — Assessment & Plan Note (Signed)
Updated lab needed at/ before next visit.   

## 2015-06-18 NOTE — Assessment & Plan Note (Signed)
Sleep hygiene reviewed and written information offered also. Prescription sent for  medication needed.  

## 2015-06-18 NOTE — Assessment & Plan Note (Signed)
Reportedly worsening, states she knows she needs to quit smoking and is currently unable to smoke as much as in the past Followed by pulmonary, and recently treated for flare

## 2015-06-18 NOTE — Assessment & Plan Note (Signed)
Needs to remain under care of psychiatry, denies active suicidal or homicidal ideation at this time

## 2015-06-18 NOTE — Progress Notes (Signed)
Subjective:    Patient ID: [name], female    DOB: 1951-04-11, 65 y.o.   MRN: [id number]  HPI   [name]     MRN: [id number]      DOB: 12-26-1950   HPI [name] is here for follow up and re-evaluation of chronic medical conditions, medication management and review of any available recent lab and radiology data.  Preventive health is updated, specifically  Cancer screening and Immunization.  Refuses many screening test including eye exam, requests flu vaccine . The PT denies any adverse reactions to current medications since the last visit.  Wants MOST form changed to suit No CPR wish, which is new Needs statement signed and form completed verifying he need for oxygen, hence continual electricity supply  ROS Denies recent fever or chills. Denies sinus pressure, nasal congestion, ear pain or sore throat. Chronic  chest congestion, productive cough or wheezing.Recent COPD flare treated by pulmonary and doing better Denies chest pains, palpitations and leg swelling Denies abdominal pain, nausea, vomiting,diarrhea or constipation.   Denies dysuria, frequency, hesitancy or incontinence. C/o chronic  joint pain, swelling and limitation in mobility. Denies headaches, seizures, numbness, or tingling. Denies uncontrolled depression, anxiety or insomnia.States however , that she has no interest / desire in aggressive health car, wants to maintain and to "go whenever it is her time"Ha d requested I prescribe hr mentl health meds but she needs to remain with psych and understands this Re addressed MOST form at visit today, new form provided wants NO CPR, but is willing to be transported to hospital for limited care  Denies skin break down or rash.   PE  BP 130/80 mmHg  Pulse 87  Resp 16  Ht '5\' 3"'$  (1.6 m)  Wt 226 lb (102.513 kg)  BMI 40.04 kg/m2  SpO2 97%  Patient alert and oriented and in no cardiopulmonary distress.Requires supplemental oxygen 24 hrs per  day  HEENT: No facial asymmetry, EOMI,   oropharynx pink and moist.  Neck decreased ROm no JVD, no mass.  Chest: Clear to auscultation bilaterally.Decreased air entry throughout few crackles ,  No wheezes  CVS: S1, S2 no murmurs, no S3.Regular rate.  ABD: Soft non tender.   Ext: No edema  MS: decreased ROM spine, shoulders, hips and knees.  Skin: Intact, no ulcerations or rash noted.  Psych: Good eye contact, normal affect. Memory intact not anxious or depressed appearing.  CNS: CN 2-12 intact, power,  normal throughout.no focal deficits noted.   Assessment & Plan  NICOTINE ADDICTION Patient counseled for approximately 5 minutes regarding the health risks of ongoing nicotine use, specifically all types of cancer, heart disease, stroke and respiratory failure. The options available for help with cessation ,the behavioral changes to assist the process, and the option to either gradully reduce usage  Or abruptly stop.is also discussed. Pt is also encouraged to set specific goals in number of cigarettes used daily, as well as to set a quit date.  Number of cigarettes/cigars currently smoking daily:7   Essential hypertension Controlled, no change in medication DASH diet and commitment to daily physical activity for a minimum of 30 minutes discussed and encouraged, as a part of hypertension management. The importance of attaining a healthy weight is also discussed.  BP/Weight 06/18/2015 01/22/2015 01/09/2015 12/28/2014 11/29/2014 07/04/2014 28/11/6809  Systolic BP 572 620 355 974 163 845 364  Diastolic BP 80 70 82 70 78 76 62  Wt. (Lbs) 226 217 209 218 219 206  222.12  BMI 40.04 38.45 37.03 38.63 38.8 35.34 38.11  Some encounter information is confidential and restricted. Go to Review Flowsheets activity to see all data.        COPD, severe Reportedly worsening, states she knows she needs to quit smoking and is currently unable to smoke as much as in the past Followed by pulmonary,  and recently treated for flare  Diabetes mellitus, insulin dependent (IDDM), controlled Updated lab needed at/ before next visit. [name] is reminded of the importance of commitment to daily physical activity for 30 minutes or more, as able and the need to limit carbohydrate intake to 30 to 60 grams per meal to help with blood sugar control.   The need to take medication as prescribed, test blood sugar as directed, and to call between visits if there is a concern that blood sugar is uncontrolled is also discussed.   [name] is reminded of the importance of daily foot exam, annual eye examination, and good blood sugar, blood pressure and cholesterol control.  Diabetic Labs Latest Ref Rng 12/26/2014 09/05/2014 05/01/2014 12/14/2013 08/09/2013  HbA1c <5.7 % 7.3(H) 7.3(H) 7.0(H) 7.6(H) 8.1(H)  Microalbumin <2.0 mg/dL 1.6 - - 0.59 -  Micro/Creat Ratio 0.0 - 30.0 mg/g 3.8 - - 2.8 -  Chol 125 - 200 mg/dL 111(L) - 116 127 -  HDL >=46 mg/dL 45(L) - 42 50 -  Calc LDL <130 mg/dL 52 - 53 65 -  Triglycerides <150 mg/dL 72 - 103 60 -  Creatinine 0.50 - 0.99 mg/dL 1.18(H) 1.12(H) 1.13(H) 1.21(H) 0.93   BP/Weight 06/18/2015 01/22/2015 01/09/2015 12/28/2014 11/29/2014 07/04/2014 78/07/9560  Systolic BP 130 865 784 696 295 284 132  Diastolic BP 80 70 82 70 78 76 62  Wt. (Lbs) 226 217 209 218 219 206 222.12  BMI 40.04 38.45 37.03 38.63 38.8 35.34 38.11  Some encounter information is confidential and restricted. Go to Review Flowsheets activity to see all data.   Foot/eye exam completion dates 06/18/2015 03/09/2014  Foot exam Order - -  Foot Form Completion Done Done         Hypothyroidism Updated lab needed at/ before next visit.   Anxiety and depression Needs to remain under care of psychiatry, denies active suicidal or homicidal ideation at this time  Insomnia Sleep hygiene reviewed and written information offered also. Prescription sent for  medication needed.   Hyperlipidemia LDL goal  <100 Hyperlipidemia:Low fat diet discussed and encouraged.   Lipid Panel  Lab Results  Component Value Date   CHOL 111* 12/26/2014   HDL 45* 12/26/2014   LDLCALC 52 12/26/2014   TRIG 72 12/26/2014   CHOLHDL 2.5 12/26/2014   Updated lab needed      DNR no code (do not resuscitate) Pt in today requesting a change on the MOST form that she had signed in 2016. Specific change is that she wants NO CPR and is adamant about this. She is willing to be transported to the hospital, have limited access to IV fluids and antibiotics , and  States no tube feeding. States she is "ready t to go home once it is her time" States her COPD is worsening and when she recently spoke with her pulmonary Doc during a flare states she told him she wants no CPR Of note she has been cutting back on smoking , states with deterioration in lungs she "has no choice" Will need to further address with responsible family member present and change to a yellow DNR form  to be kept at her home Will also address [facility] case worker to discuss with her sand review in detail     Review of Systems     Objective:   Physical Exam        Assessment & Plan:

## 2015-06-19 LAB — COMPLETE METABOLIC PANEL WITH GFR
ALT: 10 U/L (ref 6–29)
AST: 15 U/L (ref 10–35)
Albumin: 3.8 g/dL (ref 3.6–5.1)
Alkaline Phosphatase: 110 U/L (ref 33–130)
BILIRUBIN TOTAL: 0.3 mg/dL (ref 0.2–1.2)
BUN: 31 mg/dL — AB (ref 7–25)
CHLORIDE: 104 mmol/L (ref 98–110)
CO2: 25 mmol/L (ref 20–31)
CREATININE: 1.31 mg/dL — AB (ref 0.50–0.99)
Calcium: 9.3 mg/dL (ref 8.6–10.4)
GFR, EST AFRICAN AMERICAN: 50 mL/min — AB (ref >=60)
GFR, Est Non African American: 43 mL/min — ABNORMAL LOW (ref >=60)
GLUCOSE: 149 mg/dL — AB (ref 65–99)
Potassium: 4.3 mmol/L (ref 3.5–5.3)
Sodium: 140 mmol/L (ref 135–146)
TOTAL PROTEIN: 6.4 g/dL (ref 6.1–8.1)

## 2015-06-19 LAB — TSH: TSH: 1.467 u[IU]/mL (ref 0.350–4.500)

## 2015-06-19 LAB — LIPID PANEL
CHOL/HDL RATIO: 2.9 ratio (ref <=5.0)
CHOLESTEROL: 149 mg/dL (ref 125–200)
HDL: 51 mg/dL (ref >=46)
LDL CALC: 81 mg/dL (ref <130)
TRIGLYCERIDES: 83 mg/dL (ref <150)
VLDL: 17 mg/dL (ref <30)

## 2015-06-19 LAB — VITAMIN D 25 HYDROXY (VIT D DEFICIENCY, FRACTURES): Vit D, 25-Hydroxy: 19 ng/mL — ABNORMAL LOW (ref 30–100)

## 2015-06-19 LAB — HEMOGLOBIN A1C
Hgb A1c MFr Bld: 7.8 % — ABNORMAL HIGH (ref <5.7)
Mean Plasma Glucose: 177 mg/dL — ABNORMAL HIGH (ref <117)

## 2015-06-20 ENCOUNTER — Other Ambulatory Visit: Payer: Self-pay

## 2015-06-20 MED ORDER — VITAMIN D (ERGOCALCIFEROL) 1.25 MG (50000 UNIT) PO CAPS: 50000.0000 [IU] | ORAL_CAPSULE | ORAL | Status: DC

## 2015-06-29 ENCOUNTER — Other Ambulatory Visit: Payer: Self-pay

## 2015-06-29 DIAGNOSIS — M47899 Other spondylosis, site unspecified: Secondary | ICD-10-CM

## 2015-06-29 MED ORDER — HYDROCODONE-ACETAMINOPHEN 10-325 MG PO TABS: ORAL_TABLET | ORAL | Status: DC

## 2015-07-04 ENCOUNTER — Other Ambulatory Visit: Payer: Self-pay | Admitting: Family Medicine

## 2015-07-09 ENCOUNTER — Encounter (HOSPITAL_COMMUNITY): Payer: Self-pay | Admitting: Psychiatry

## 2015-07-09 ENCOUNTER — Ambulatory Visit (INDEPENDENT_AMBULATORY_CARE_PROVIDER_SITE_OTHER): Payer: Medicare Other | Admitting: Psychiatry

## 2015-07-09 VITALS — BP 111/57 | HR 74 | Ht 63.0 in | Wt 222.0 lb

## 2015-07-09 DIAGNOSIS — F333 Major depressive disorder, recurrent, severe with psychotic symptoms: Secondary | ICD-10-CM

## 2015-07-09 MED ORDER — FLUOXETINE HCL 20 MG PO CAPS: ORAL_CAPSULE | ORAL | Status: DC

## 2015-07-09 MED ORDER — ALPRAZOLAM 1 MG PO TABS
ORAL_TABLET | ORAL | Status: DC
Start: 2015-07-09 — End: 2015-10-01

## 2015-07-09 NOTE — Progress Notes (Signed)
Patient ID: [name], female   DOB: 1951-04-02, 65 y.o.   MRN: [id number] Patient ID: [name], female   DOB: 06-06-1950, 65 y.o.   MRN: [id number] Patient ID: [name], female   DOB: 01/21/1951, 65 y.o.   MRN: [id number] Patient ID: [name], female   DOB: 04/30/1951, 64 y.o.   MRN: [id number] Patient ID: [name], female   DOB: [birth year], 65 y.o.   MRN: [id number] Patient ID: [name], female   DOB: 07/06/1950, 65 y.o.   MRN: [id number]  Psychiatric Assessment Adult  Patient Identification:  [name] Date of Evaluation:  07/09/2015 Chief Complaint: depression History of Chief Complaint:   Chief Complaint  Patient presents with  . Depression  . Anxiety  . Follow-up    Depression        Associated symptoms include fatigue and appetite change.  Past medical history includes anxiety.   Anxiety Symptoms include nervous/anxious behavior and shortness of breath.    this patient is a 65 year old widowed black female who lives alone in [place]. She has no children. She is on disability.  The patient was referred by Dr. Moshe Cipro her primary care physician and Dr. Jefm Miles her psychologist for further evaluation and treatment of major depression and anxiety.  The patient states that she's been depressed since her teen years. She's not really sure why. When she was in college she went through severe depression and had to quit. She's had this on and off through the years but really not receive treatment until a couple years ago after her husband died. Her primary care physician has put her on Prozac and Xanax which have helped to some degree.however recently she has been getting more depressed. She has severe COPD and is on oxygen. Yet, she continues to smoke THREE PACKS PER DAY which she claims is secondary to her depressed mood.  Recently she's gone through more deaths including her sister in 2014 a niece in 2014. Another sister was recently  diagnosed with cancer. The patient has gotten overwhelmed by all this and claims it's getting hard for her to just get out of bed and function. She has no energy, she's been shaky and having tremors in her hands, she complains of short-term memory loss.her sleep is variable and Ambien sometimes helps. Her appetite is up and down. Sometimes she hears the voices of her deceased relatives or "feels her spirits." She's never been to a psychiatric hospitalization  But has been seeing Dr. Jefm Miles in our office for several years. She has never been suicidal and states she is very "spiritual and values life. She stays away from people but she has a god niece and  God sister who check on her faithfully.she denies any other psychotic symptoms and does not use drugs or alcohol  The patient returns after 3 months. For the most part she is doing okay. She is still on oxygen. However she's cut her smoking way back and she only smokes 7 cigarettes a day. She states that she is trying to quit but finds it difficult. Her mood is been stable with the Prozac. She is benefiting greatly from her counseling here. She is sleeping well and her energy is fairly good. The Xanax has really helped her anxiety Review of Systems  Constitutional: Positive for activity change, appetite change and fatigue.  HENT: Negative.   Eyes: Negative.   Respiratory: Positive for shortness of breath.   Cardiovascular: Negative.   Gastrointestinal: Negative.  Endocrine: Negative.   Genitourinary: Negative.   Musculoskeletal: Positive for arthralgias.  Skin: Negative.   Allergic/Immunologic: Negative.   Neurological: Positive for tremors.  Hematological: Negative.   Psychiatric/Behavioral: Positive for depression, hallucinations, sleep disturbance and dysphoric mood. The patient is nervous/anxious.    Physical Examnot done  Depressive Symptoms: depressed mood, anhedonia, psychomotor retardation, difficulty concentrating, impaired  memory, anxiety, disturbed sleep, decreased appetite,  (Hypo) Manic Symptoms:   Elevated Mood:  No Irritable Mood:  No Grandiosity:  No Distractibility:  No Labiality of Mood:  No Delusions:  No Hallucinations:  Yes Impulsivity:  Yes Sexually Inappropriate Behavior:  No Financial Extravagance:  No Flight of Ideas:  No  Anxiety Symptoms: Excessive Worry:  Yes Panic Symptoms:  No Agoraphobia:  No Obsessive Compulsive: No  Symptoms: None, Specific Phobias:  No Social Anxiety:  No  Psychotic Symptoms:  Hallucinations: Yes Auditory Delusions:  No Paranoia:  No   Ideas of Reference:  No  PTSD Symptoms: Ever had a traumatic exposure:  No Had a traumatic exposure in the last month:  No Re-experiencing: No None Hypervigilance:  No Hyperarousal: No None Avoidance: No None  Traumatic Brain Injury: No   Past Psychiatric History: Diagnosis:Maj. depression  Hospitalizations: none  Outpatient Care: has been seeing Dr. Jefm Miles in our office for counseling  Substance Abuse Care: none  Self-Mutilation: none  Suicidal Attempts:none  Violent Behaviors: none   Past Medical History:   Past Medical History  Diagnosis Date  . DJD (degenerative joint disease)     of the spine   . GERD (gastroesophageal reflux disease)   . Insomnia   . COPD (chronic obstructive pulmonary disease) ([place])   . Obesity   . Anxiety   . Hypothyroidism 2004    left thyroidectomy  . Diabetes mellitus, type 1 1983    type 2 diabetes  . Hypertension 1983  . Hyperlipidemia 2007  . Depression 2008   History of Loss of Consciousness:  No Seizure History:  No Cardiac History:  No Allergies:   Allergies  Allergen Reactions  . Codeine     REACTION: nausea   Current Medications:  Current Outpatient Prescriptions  Medication Sig Dispense Refill  . ALPRAZolam (XANAX) 1 MG tablet TAKE ONE TABLET FOUR TIMES A DAY 120 tablet 2  . aspirin (ASPIRIN LOW DOSE) 81 MG EC tablet Take 81 mg by mouth  daily.      . B-D INS SYR ULTRAFINE 1CC/31G 31G X 5/16" 1 ML MISC use as directed FOR ONCE DAILY TESTING 100 each 5  . calcium carbonate (TUMS) 500 MG chewable tablet Chew 1 tablet by mouth as needed.      . cloNIDine (CATAPRES) 0.2 MG tablet Take 1 tablet (0.2 mg total) by mouth at bedtime. 30 tablet 4  . clotrimazole-betamethasone (LOTRISONE) cream Apply 1 application topically 2 (two) times daily. 30 g 0  . esomeprazole (NEXIUM) 40 MG capsule take 1 capsule by mouth once daily BEFORE BREAKFAST 30 capsule 4  . FLUoxetine (PROZAC) 20 MG capsule Take two in the am and one in the evening 90 capsule 2  . Fluticasone-Salmeterol (ADVAIR DISKUS) 250-50 MCG/DOSE AEPB inhale 1 dose by mouth twice a day 60 each 4  . Foot Care Products (DIABETIC INSOLES) MISC by Does not apply route. And Shoes    . furosemide (LASIX) 40 MG tablet take 1 tablet by mouth once daily 30 tablet 4  . glipiZIDE (GLIPIZIDE XL) 2.5 MG 24 hr tablet Take 1 tablet (2.5 mg total) by  mouth daily. 30 tablet 4  . Glucose Blood (BAYER BREEZE 2 TEST) DISK by In Vitro route. Lancets and strips for four times a day testing     . guaiFENesin 100 MG/5ML SOLN Take 100 mg by mouth 4 (four) times daily. Reported on 06/18/2015    . HYDROcodone-acetaminophen (NORCO) 10-325 MG tablet One tablet once daily 30 tablet 0  . insulin glargine (LANTUS) 100 UNIT/ML injection 50 units once daily (Patient taking differently: 50 units once daily prn) 30 mL 3  . ipratropium-albuterol (DUONEB) 0.5-2.5 (3) MG/3ML SOLN Take 3 mLs by nebulization 4 (four) times daily as needed.     . levalbuterol (XOPENEX HFA) 45 MCG/ACT inhaler Inhale 1-2 puffs into the lungs as needed.      Marland Kitchen levothyroxine (SYNTHROID, LEVOTHROID) 100 MCG tablet Take 1 tablet (100 mcg total) by mouth daily. 30 tablet 4  . lisinopril-hydrochlorothiazide (PRINZIDE,ZESTORETIC) 20-12.5 MG tablet take 1 tablet by mouth once daily 30 tablet 4  . meloxicam (MOBIC) 7.5 MG tablet take 1 tablet once daily 30  tablet 2  . montelukast (SINGULAIR) 10 MG tablet Take 1 tablet (10 mg total) by mouth at bedtime. 30 tablet 4  . Nystatin (NYAMYC) 100000 UNIT/GM POWD apply to affected area twice a day if needed 60 g 0  . potassium chloride 20 MEQ/15ML (10%) solution TAKE 1 AND 1/2 TEASPOONFUL BY MOUTH ONCE DAILY 240 mL 2  . rosuvastatin (CRESTOR) 20 MG tablet Take 1 tablet (20 mg total) by mouth daily. 30 tablet 4  . Vitamin D, Ergocalciferol, (DRISDOL) 50000 units CAPS capsule Take 1 capsule (50,000 Units total) by mouth every 7 (seven) days. 4 capsule 11  . zolpidem (AMBIEN) 10 MG tablet take 1 tablet by mouth at bedtime for sleep 30 tablet 3   No current facility-administered medications for this visit.    Previous Psychotropic Medications:  Medication Dose   Wellbutrin-caused irritability                       Substance Abuse History in the last 12 months: Substance Age of 1st Use Last Use Amount Specific Type  Nicotine   Smokes 3 packs of cigarettes per day   Alcohol      Cannabis      Opiates      Cocaine      Methamphetamines      LSD      Ecstasy      Benzodiazepines      Caffeine      Inhalants      Others:                          Medical Consequences of Substance Abuse: smoking is making her COPD much worse  Legal Consequences of Substance Abuse: none  Family Consequences of Substance Abuse: none  Blackouts:  No DT's:  No Withdrawal Symptoms:  No None  Social History: Current Place of Residence: [place] of Birth: [place] Family Members: 2 sisters Marital Status:  Widowed Children: none   Relationships:  Education:  Dentist Problems/Performance:  Religious Beliefs/Practices: Christian History of Abuse: none Energy manager History:  None. Legal History: none Hobbies/Interests: listening to gospel music  Family History:   Family History  Problem Relation Age of Onset  .  Hypertension Mother   . Stroke Mother   . Cancer Father     lung   . Kidney failure  family history   . Hypertension      family history   . Hypertension Sister   . Depression Sister   . Alcohol abuse Sister   . Hypertension Sister   . Depression Sister   . Diabetes Sister   . Depression Sister   . Cancer Sister     renal     Mental Status Examination/Evaluation: Objective:  Appearance: Casual and Fairly Groomed  using portable oxygen  Eye Contact::  Good  Speech:  Clear and Coherent  Volume:  Decreased  Mood:good  Affect:  Bright  Thought Process:  Circumstantial  Orientation:  Full (Time, Place, and Person)  Thought Content:  Rumination   Suicidal Thoughts:  No  Homicidal Thoughts:  No  Judgement:  Fair  Insight:  Fair  Psychomotor Activity:  Decreased  Akathisia:  No  Handed:  Right  AIMS (if indicated):    Assets:  Communication Skills Desire for Improvement Social Support    Laboratory/X-Ray Psychological Evaluation(s)   reviewed in chart     Assessment:  Axis I: Major Depression, Recurrent severe  AXIS I Major Depression, Recurrent severe  AXIS II Deferred  AXIS III Past Medical History  Diagnosis Date  . DJD (degenerative joint disease)     of the spine   . GERD (gastroesophageal reflux disease)   . Insomnia   . COPD (chronic obstructive pulmonary disease) ([place])   . Obesity   . Anxiety   . Hypothyroidism 2004    left thyroidectomy  . Diabetes mellitus, type 1 1983    type 2 diabetes  . Hypertension 1983  . Hyperlipidemia 2007  . Depression 2008     AXIS IV problems related to social environment  AXIS V 41-50 serious symptoms   Treatment Plan/Recommendations:  Plan of Care: medication management  Laboratory:   Psychotherapy: she is seeing Dr. Jefm Miles here  Medications: she'll continue Prozac 60 mg daily for depression and Xanax 1 mg 4 times a day for anxiety   Routine PRN Medications:  No  Consultations:   Safety Concerns:   She denies thoughts or plans of self-harm  Other:she'll return in 3 months     Levonne Spiller, MD 2/6/20171:34 PM

## 2015-07-13 ENCOUNTER — Ambulatory Visit (HOSPITAL_COMMUNITY): Payer: Self-pay | Admitting: Psychology

## 2015-07-27 ENCOUNTER — Other Ambulatory Visit: Payer: Self-pay

## 2015-07-27 DIAGNOSIS — M47899 Other spondylosis, site unspecified: Secondary | ICD-10-CM

## 2015-07-27 MED ORDER — HYDROCODONE-ACETAMINOPHEN 10-325 MG PO TABS: ORAL_TABLET | ORAL | Status: DC

## 2015-08-07 ENCOUNTER — Other Ambulatory Visit: Payer: Self-pay

## 2015-08-07 MED ORDER — FUROSEMIDE 40 MG PO TABS: 40.0000 mg | ORAL_TABLET | Freq: Every day | ORAL | Status: DC

## 2015-08-24 ENCOUNTER — Other Ambulatory Visit: Payer: Self-pay

## 2015-08-24 DIAGNOSIS — M47899 Other spondylosis, site unspecified: Secondary | ICD-10-CM

## 2015-08-24 MED ORDER — HYDROCODONE-ACETAMINOPHEN 10-325 MG PO TABS
ORAL_TABLET | ORAL | Status: DC
Start: 2015-08-24 — End: 2015-09-18

## 2015-08-31 ENCOUNTER — Ambulatory Visit (HOSPITAL_COMMUNITY): Payer: Self-pay | Admitting: Psychology

## 2015-09-04 ENCOUNTER — Other Ambulatory Visit: Payer: Self-pay

## 2015-09-04 MED ORDER — GLIPIZIDE ER 2.5 MG PO TB24: 2.5000 mg | ORAL_TABLET | Freq: Every day | ORAL | Status: DC

## 2015-09-04 MED ORDER — MONTELUKAST SODIUM 10 MG PO TABS: 10.0000 mg | ORAL_TABLET | Freq: Every day | ORAL | Status: DC

## 2015-09-04 MED ORDER — MELOXICAM 7.5 MG PO TABS: 7.5000 mg | ORAL_TABLET | Freq: Every day | ORAL | Status: DC

## 2015-09-04 MED ORDER — LEVOTHYROXINE SODIUM 100 MCG PO TABS: 100.0000 ug | ORAL_TABLET | Freq: Every day | ORAL | Status: DC

## 2015-09-11 ENCOUNTER — Encounter (HOSPITAL_COMMUNITY): Payer: Self-pay | Admitting: Psychology

## 2015-09-11 ENCOUNTER — Ambulatory Visit (INDEPENDENT_AMBULATORY_CARE_PROVIDER_SITE_OTHER): Payer: Medicare Other | Admitting: Psychology

## 2015-09-11 DIAGNOSIS — F333 Major depressive disorder, recurrent, severe with psychotic symptoms: Secondary | ICD-10-CM

## 2015-09-11 DIAGNOSIS — F4001 Agoraphobia with panic disorder: Secondary | ICD-10-CM | POA: Diagnosis not present

## 2015-09-11 NOTE — Progress Notes (Signed)
      PROGRESS NOTE  Patient:  [name]   DOB: 1951-04-28  MR Number: [id number]  Location: [facility] [address] [place] [address] Dept: [phone]  Start: 9 AM End: 10 AM  Provider/Observer:     Edgardo Roys PSYD  Chief Complaint:      Chief Complaint  Patient presents with  . Depression  . Anxiety  . Stress    Reason For Service:     The patient was referred for psychotherapeutic interventions because of ongoing symptoms of depression, anxiety, and mood swings including anger outbursts particularly of a verbal nature.  Interventions Strategy:  Cognitive/behavioral psychotherapeutic interventions  Participation Level:   Active  Participation Quality:  Appropriate      Behavioral Observation:  Well Groomed, Alert, and Appropriate.   Current Psychosocial Factors: The patient reports that she continues to take care of her sister as much as she can.  Conflict with the nieces has continued to be problematic.  Pts breathing continues to get worse and patient feels she is getting to a place of end stage of breathing difficulties. her Content of Session:   Review current symptoms and continued work on therapeutic interventions for issues of recurrent depression and panic attacks.  Current Status:   The patient reports that she doing what she can with her sister to keep the sister out of a nursing home.  Patient Progress:   The patient reports that she had started doing much worse.  She felt it was related to side effects of Rexulti and she stopped that medication on her own.  Target Goals:   Target goals have to do with reducing the intensity, duration, and frequency of depressive events as well as reducing the intensity, duration, and frequency of panic attacks. Target goals include building coping skills and strategies around both of these diagnostic issues.  Last  Reviewed:   09/11/2015  Goals Addressed Today:    Specific goals addressed today had to do with managing and coping with her cognitive and behavioral responses to stressors particularly around issues that may lead to full-blown panic attacks when she is in medical situations. These have been particular triggers for her in the past for panic attacks due to her medical illnesses and past medical treatments.  Impression/Diagnosis:   The patient has had a lot of significant medical issues to deal with particularly regarding pulmonary function. However, she also has a long history of depression and panic attacks have become exacerbated due to to persistent and serious medical issues.  Diagnosis:    Axis I:  Severe recurrent major depressive disorder with psychotic features (HCC)  Panic disorder with agoraphobia and moderate panic attacks      Axis II: No diagnosis     RODENBOUGH,JOHN R, PsyD 09/11/2015

## 2015-09-18 ENCOUNTER — Other Ambulatory Visit: Payer: Self-pay

## 2015-09-18 DIAGNOSIS — M47899 Other spondylosis, site unspecified: Secondary | ICD-10-CM

## 2015-09-18 MED ORDER — HYDROCODONE-ACETAMINOPHEN 10-325 MG PO TABS: ORAL_TABLET | ORAL | Status: DC

## 2015-10-01 ENCOUNTER — Ambulatory Visit (INDEPENDENT_AMBULATORY_CARE_PROVIDER_SITE_OTHER): Payer: Medicare Other | Admitting: Psychiatry

## 2015-10-01 ENCOUNTER — Other Ambulatory Visit: Payer: Self-pay

## 2015-10-01 ENCOUNTER — Encounter (HOSPITAL_COMMUNITY): Payer: Self-pay | Admitting: Psychiatry

## 2015-10-01 ENCOUNTER — Encounter: Payer: Self-pay | Admitting: Family Medicine

## 2015-10-01 ENCOUNTER — Ambulatory Visit (INDEPENDENT_AMBULATORY_CARE_PROVIDER_SITE_OTHER): Payer: Medicare Other | Admitting: Family Medicine

## 2015-10-01 VITALS — BP 146/92 | HR 110 | Resp 18 | Ht 63.0 in | Wt 230.0 lb

## 2015-10-01 VITALS — BP 154/86 | HR 89 | Ht 63.0 in | Wt 230.4 lb

## 2015-10-01 DIAGNOSIS — M62830 Muscle spasm of back: Secondary | ICD-10-CM | POA: Diagnosis not present

## 2015-10-01 DIAGNOSIS — F419 Anxiety disorder, unspecified: Secondary | ICD-10-CM

## 2015-10-01 DIAGNOSIS — F333 Major depressive disorder, recurrent, severe with psychotic symptoms: Secondary | ICD-10-CM

## 2015-10-01 DIAGNOSIS — F17200 Nicotine dependence, unspecified, uncomplicated: Secondary | ICD-10-CM

## 2015-10-01 DIAGNOSIS — G4719 Other hypersomnia: Secondary | ICD-10-CM

## 2015-10-01 DIAGNOSIS — IMO0001 Reserved for inherently not codable concepts without codable children: Secondary | ICD-10-CM

## 2015-10-01 DIAGNOSIS — F418 Other specified anxiety disorders: Secondary | ICD-10-CM

## 2015-10-01 DIAGNOSIS — I10 Essential (primary) hypertension: Secondary | ICD-10-CM | POA: Diagnosis not present

## 2015-10-01 DIAGNOSIS — E559 Vitamin D deficiency, unspecified: Secondary | ICD-10-CM

## 2015-10-01 DIAGNOSIS — E038 Other specified hypothyroidism: Secondary | ICD-10-CM

## 2015-10-01 DIAGNOSIS — E785 Hyperlipidemia, unspecified: Secondary | ICD-10-CM

## 2015-10-01 DIAGNOSIS — J449 Chronic obstructive pulmonary disease, unspecified: Secondary | ICD-10-CM

## 2015-10-01 DIAGNOSIS — F329 Major depressive disorder, single episode, unspecified: Secondary | ICD-10-CM

## 2015-10-01 DIAGNOSIS — E119 Type 2 diabetes mellitus without complications: Secondary | ICD-10-CM

## 2015-10-01 DIAGNOSIS — Z794 Long term (current) use of insulin: Secondary | ICD-10-CM

## 2015-10-01 MED ORDER — FLUOXETINE HCL 20 MG PO CAPS: ORAL_CAPSULE | ORAL | Status: DC

## 2015-10-01 MED ORDER — ALPRAZOLAM 1 MG PO TABS: ORAL_TABLET | ORAL | Status: DC

## 2015-10-01 MED ORDER — AMLODIPINE BESYLATE 5 MG PO TABS: 5.0000 mg | ORAL_TABLET | Freq: Every day | ORAL | Status: DC

## 2015-10-01 MED ORDER — ESOMEPRAZOLE MAGNESIUM 40 MG PO CPDR: DELAYED_RELEASE_CAPSULE | ORAL | Status: DC

## 2015-10-01 MED ORDER — CYCLOBENZAPRINE HCL 10 MG PO TABS: 10.0000 mg | ORAL_TABLET | Freq: Every day | ORAL | Status: DC

## 2015-10-01 NOTE — Patient Instructions (Addendum)
Annual wellness in mid July, call if you need me before  New is a  muscle relaxant for back spasm and pain, also use tylenol 325 mg maximum of 3 daily  HBA1C, CBC,chem 7 and EGFR , TSH and Vit D, and microalb in July, 1 week before appt  Please cut back on cigarettes, ned to quit!current is 10 per day  Blood pressure is high, new additional med amlodipine 1 at bedtime     You Can Quit Smoking If you are ready to quit smoking or are thinking about it, congratulations! You have chosen to help yourself be healthier and live longer! There are lots of different ways to quit smoking. Nicotine gum, nicotine patches, a nicotine inhaler, or nicotine nasal spray can help with physical craving. Hypnosis, support groups, and medicines help break the habit of smoking. TIPS TO GET OFF AND STAY OFF CIGARETTES  Learn to predict your moods. Do not let a bad situation be your excuse to have a cigarette. Some situations in your life might tempt you to have a cigarette.  Ask friends and co-workers not to smoke around you.  Make your home smoke-free.  Never have "just one" cigarette. It leads to wanting another and another. Remind yourself of your decision to quit.  On a card, make a list of your reasons for not smoking. Read it at least the same number of times a day as you have a cigarette. Tell yourself everyday, "I do not want to smoke. I choose not to smoke."  Ask someone at home or work to help you with your plan to quit smoking.  Have something planned after you eat or have a cup of coffee. Take a walk or get other exercise to perk you up. This will help to keep you from overeating.  Try a relaxation exercise to calm you down and decrease your stress. Remember, you may be tense and nervous the first two weeks after you quit. This will pass.  Find new activities to keep your hands busy. Play with a pen, coin, or rubber band. Doodle or draw things on paper.  Brush your teeth right after eating.  This will help cut down the craving for the taste of tobacco after meals. You can try mouthwash too.  Try gum, breath mints, or diet candy to keep something in your mouth. IF YOU SMOKE AND WANT TO QUIT:  Do not stock up on cigarettes. Never buy a carton. Wait until one pack is finished before you buy another.  Never carry cigarettes with you at work or at home.  Keep cigarettes as far away from you as possible. Leave them with someone else.  Never carry matches or a lighter with you.  Ask yourself, "Do I need this cigarette or is this just a reflex?"  Bet with someone that you can quit. Put cigarette money in a piggy bank every morning. If you smoke, you give up the money. If you do not smoke, by the end of the week, you keep the money.  Keep trying. It takes 21 days to change a habit!  Talk to your doctor about using medicines to help you quit. These include nicotine replacement gum, lozenges, or skin patches.   This information is not intended to replace advice given to you by your health care provider. Make sure you discuss any questions you have with your health care provider.   Document Released: 03/15/2009 Document Revised: 08/11/2011 Document Reviewed: 03/15/2009 Elsevier Interactive Patient Education 2016 Elsevier  Inc[employer]

## 2015-10-01 NOTE — Progress Notes (Signed)
Subjective:    Patient ID: [name], female    DOB: 1950/09/23, 65 y.o.   MRN: [id number]  HPI   [name]     MRN: [id number]      DOB: 1951-01-18   HPI Ms. [name] is here for follow up and re-evaluation of chronic medical conditions, medication management and review of any available recent lab and radiology data.  Preventive health is updated, specifically  Cancer screening and Immunization.   Questions or concerns regarding consultations or procedures which the PT has had in the interim are  addressed. The PT denies any adverse reactions to current medications since the last visit.  Reports indiscretion with diet , states blood sugars are uncontrolled currently, but she will change diet to improve this in next 2 months C/o increased back spasm C/o ongoing poor family relationships and little drive/ interest in living, though no suicidal or ho micidal  ROS Denies recent fever or chills. Denies sinus pressure, nasal congestion, ear pain or sore throat. Denies chest congestion, productive cough or wheezing.Has chronic smokers coughDenies chest pains, palpitations and leg swelling Denies abdominal pain, nausea, vomiting,diarrhea or constipation.   Denies dysuria, frequency, hesitancy or incontinence. C/o  joint pain,  and limitation in mobility.and increased back spasm Denies headaches, seizures, numbness, or tingling. C/o depression, anxiety and insomnia.Not suicidal or homicidal Denies skin break down or rash.   PE  BP 146/92 mmHg  Pulse 110  Resp 18  Ht '5\' 3"'$  (1.6 m)  Wt 230 lb (104.327 kg)  BMI 40.75 kg/m2  SpO2 90% on supplemental oxygen  Patient alert and oriented and in no cardiopulmonary distress at rest and on supplemental oxygen  HEENT: No facial asymmetry, EOMI,   oropharynx pink and moist.  Neck supple no JVD, no mass.  Chest: Clear to auscultation bilaterally.Decreased air entry throughout  CVS: S1, S2 no murmurs, no S3.Regular rate.  ABD:  Soft non tender.   Ext: No edema  MS: decreased  ROM spine, shoulders, hips and knees.Depends on walker for safe ambultion  Skin: Intact, no ulcerations or rash noted.  Psych: Good eye contact, normal affect. Memory intact not anxious or depressed appearing.  CNS: CN 2-12 intact, power,  normal throughout.no focal deficits noted.   Assessment & Plan   Essential hypertension Not at goal, no med change DASH diet and commitment to daily physical activity for a minimum of 30 minutes discussed and encouraged, as a part of hypertension management. The importance of attaining a healthy weight is also discussed.  BP/Weight 10/01/2015 06/18/2015 01/22/2015 01/09/2015 12/28/2014 5/73/2202 10/03/2704  Systolic BP 237 628 315 176 160 737 106  Diastolic BP 92 80 70 82 70 78 76  Wt. (Lbs) 230 226 217 209 218 219 206  BMI 40.75 40.04 38.45 37.03 38.63 38.8 35.34  Some encounter information is confidential and restricted. Go to Review Flowsheets activity to see all data.        COPD, severe Worsening due to ongoing nicotine, no change in management, also followed by pulmonary  Diabetes mellitus, insulin dependent (IDDM), controlled [name] is reminded of the importance of commitment to daily physical activity for 30 minutes or more, as able and the need to limit carbohydrate intake to 30 to 60 grams per meal to help with blood sugar control.   The need to take medication as prescribed, test blood sugar as directed, and to call between visits if there is a concern that blood sugar is uncontrolled is also  discussed.   [name] is reminded of the importance of daily foot exam, annual eye examination, and good blood sugar, blood pressure and cholesterol control.  Diabetic Labs Latest Ref Rng 06/18/2015 12/26/2014 09/05/2014 05/01/2014 12/14/2013  HbA1c <5.7 % 7.8(H) 7.3(H) 7.3(H) 7.0(H) 7.6(H)  Microalbumin <2.0 mg/dL - 1.6 - - 0.59  Micro/Creat Ratio 0.0 - 30.0 mg/g - 3.8 - - 2.8  Chol 125 -  200 mg/dL 149 111(L) - 116 127  HDL >=46 mg/dL 51 45(L) - 42 50  Calc LDL <130 mg/dL 81 52 - 53 65  Triglycerides <150 mg/dL 83 72 - 103 60  Creatinine 0.50 - 0.99 mg/dL 1.31(H) 1.18(H) 1.12(H) 1.13(H) 1.21(H)   BP/Weight 10/01/2015 06/18/2015 01/22/2015 01/09/2015 12/28/2014 6/83/4196 07/04/2977  Systolic BP 892 119 417 408 144 818 563  Diastolic BP 92 80 70 82 70 78 76  Wt. (Lbs) 230 226 217 209 218 219 206  BMI 40.75 40.04 38.45 37.03 38.63 38.8 35.34  Some encounter information is confidential and restricted. Go to Review Flowsheets activity to see all data.   Foot/eye exam completion dates 06/18/2015 03/09/2014  Foot exam Order - -  Foot Form Completion Done Done   Reports deterioration due to non compliance with diet,  Updated lab needed at/ before next visit.       NICOTINE ADDICTION Patient counseled for approximately 5 minutes regarding the health risks of ongoing nicotine use, specifically all types of cancer, heart disease, stroke and respiratory failure. The options available for help with cessation ,the behavioral changes to assist the process, and the option to either gradully reduce usage  Or abruptly stop.is also discussed. Pt is also encouraged to set specific goals in number of cigarettes used daily, as well as to set a quit date.  Number of cigarettes/cigars currently smoking daily:10   Morbid obesity Deteriorated. Patient re-educated about  the importance of commitment to a  minimum of 150 minutes of exercise per week.  The importance of healthy food choices with portion control discussed. Encouraged to start a food diary, count calories and to consider  joining a support group. Sample diet sheets offered. Goals set by the patient for the next several months.   Weight /BMI 10/01/2015 06/18/2015 01/22/2015  WEIGHT 230 lb 226 lb 217 lb  HEIGHT '5\' 3"'$  '5\' 3"'$  '5\' 3"'$   BMI 40.75 kg/m2 40.04 kg/m2 38.45 kg/m2  Some encounter information is confidential and restricted. Go to  Review Flowsheets activity to see all data.    Current exercise per week 30 minutes.   Back spasm Uncontrolled, start bed time ,muscle relaxant  Excessive daytime sleepiness Marked improvement following change in med management  Hyperlipidemia LDL goal <100 Hyperlipidemia:Low fat diet discussed and encouraged.   Lipid Panel  Lab Results  Component Value Date   CHOL 149 06/18/2015   HDL 51 06/18/2015   LDLCALC 81 06/18/2015   TRIG 83 06/18/2015   CHOLHDL 2.9 06/18/2015   Controlled, no change in medication Updated lab needed at/ before next visit.      Anxiety and depression Remains focused on poor relationships with her immediate family also focused on death and dying. Not suicidal, but continues to verbalize very poor reasons to want to live, only positive relationship and reason to live is her oldest sister who has cancer, apparently Continue to follow closely with psych       Review of Systems     Objective:   Physical Exam        Assessment &  Plan:

## 2015-10-01 NOTE — Progress Notes (Signed)
Patient ID: NADA GODLEY, female   DOB: 02-Jul-1950, 65 y.o.   MRN: [id number] Patient ID: BREXLEY CUTSHAW, female   DOB: 1950/09/11, 65 y.o.   MRN: [id number] Patient ID: TOREE EDLING, female   DOB: [birth year], 65 y.o.   MRN: [id number] Patient ID: SHON MANSOURI, female   DOB: December 01, 1950, 65 y.o.   MRN: [id number] Patient ID: TAISIA FANTINI, female   DOB: 02/06/1951, 65 y.o.   MRN: [id number] Patient ID: WINDIE MARASCO, female   DOB: [birth year], 65 y.o.   MRN: [id number] Patient ID: RENI HAUSNER, female   DOB: 13-Aug-1950, 65 y.o.   MRN: [id number]  Psychiatric Assessment Adult  Patient Identification:  CECLIA KOKER Date of Evaluation:  10/01/2015 Chief Complaint: depression History of Chief Complaint:   Chief Complaint  Patient presents with  . Depression  . Anxiety  . Follow-up    Depression        Associated symptoms include fatigue and appetite change.  Past medical history includes anxiety.   Anxiety Symptoms include nervous/anxious behavior and shortness of breath.    this patient is a 65 year old widowed black female who lives alone in [place]. She has no children. She is on disability.  The patient was referred by Dr. Moshe Cipro her primary care physician and Dr. Jefm Miles her psychologist for further evaluation and treatment of major depression and anxiety.  The patient states that she's been depressed since her teen years. She's not really sure why. When she was in college she went through severe depression and had to quit. She's had this on and off through the years but really not receive treatment until a couple years ago after her husband died. Her primary care physician has put her on Prozac and Xanax which have helped to some degree.however recently she has been getting more depressed. She has severe COPD and is on oxygen. Yet, she continues to smoke THREE PACKS PER DAY which she claims is secondary to her depressed mood.  Recently she's gone through more deaths  including her sister in 2014 a niece in 2014. Another sister was recently diagnosed with cancer. The patient has gotten overwhelmed by all this and claims it's getting hard for her to just get out of bed and function. She has no energy, she's been shaky and having tremors in her hands, she complains of short-term memory loss.her sleep is variable and Ambien sometimes helps. Her appetite is up and down. Sometimes she hears the voices of her deceased relatives or "feels her spirits." She's never been to a psychiatric hospitalization  But has been seeing Dr. Jefm Miles in our office for several years. She has never been suicidal and states she is very "spiritual and values life. She stays away from people but she has a god niece and  God sister who check on her faithfully.she denies any other psychotic symptoms and does not use drugs or alcohol  The patient returns after 3 months. For the most part she is doing okay. She is still on oxygen. However she's cut her smoking way back and she only smokes 7 cigarettes a day. Her mood is under good control with the Prozac and Xanax continues to help her anxiety. She is now the primary caregiver for her older sister who has had cancer but so far she's been able to keep going without difficulty Review of Systems  Constitutional: Positive for activity change, appetite change and fatigue.  HENT: Negative.   Eyes: Negative.   Respiratory: Positive  for shortness of breath.   Cardiovascular: Negative.   Gastrointestinal: Negative.   Endocrine: Negative.   Genitourinary: Negative.   Musculoskeletal: Positive for arthralgias.  Skin: Negative.   Allergic/Immunologic: Negative.   Neurological: Positive for tremors.  Hematological: Negative.   Psychiatric/Behavioral: Positive for depression, hallucinations, sleep disturbance and dysphoric mood. The patient is nervous/anxious.    Physical Examnot done  Depressive Symptoms: depressed mood, anhedonia, psychomotor  retardation, difficulty concentrating, impaired memory, anxiety, disturbed sleep, decreased appetite,  (Hypo) Manic Symptoms:   Elevated Mood:  No Irritable Mood:  No Grandiosity:  No Distractibility:  No Labiality of Mood:  No Delusions:  No Hallucinations:  Yes Impulsivity:  Yes Sexually Inappropriate Behavior:  No Financial Extravagance:  No Flight of Ideas:  No  Anxiety Symptoms: Excessive Worry:  Yes Panic Symptoms:  No Agoraphobia:  No Obsessive Compulsive: No  Symptoms: None, Specific Phobias:  No Social Anxiety:  No  Psychotic Symptoms:  Hallucinations: Yes Auditory Delusions:  No Paranoia:  No   Ideas of Reference:  No  PTSD Symptoms: Ever had a traumatic exposure:  No Had a traumatic exposure in the last month:  No Re-experiencing: No None Hypervigilance:  No Hyperarousal: No None Avoidance: No None  Traumatic Brain Injury: No   Past Psychiatric History: Diagnosis:Maj. depression  Hospitalizations: none  Outpatient Care: has been seeing Dr. Jefm Miles in our office for counseling  Substance Abuse Care: none  Self-Mutilation: none  Suicidal Attempts:none  Violent Behaviors: none   Past Medical History:   Past Medical History  Diagnosis Date  . DJD (degenerative joint disease)     of the spine   . GERD (gastroesophageal reflux disease)   . Insomnia   . COPD (chronic obstructive pulmonary disease) ([place])   . Obesity   . Anxiety   . Hypothyroidism 2004    left thyroidectomy  . Diabetes mellitus, type 1 1983    type 2 diabetes  . Hypertension 1983  . Hyperlipidemia 2007  . Depression 2008   History of Loss of Consciousness:  No Seizure History:  No Cardiac History:  No Allergies:   Allergies  Allergen Reactions  . Codeine     REACTION: nausea   Current Medications:  Current Outpatient Prescriptions  Medication Sig Dispense Refill  . ALPRAZolam (XANAX) 1 MG tablet TAKE ONE TABLET FOUR TIMES A DAY 120 tablet 2  . ALPRAZolam  (XANAX) 1 MG tablet TAKE ONE TABLET FOUR TIMES A DAY 120 tablet 2  . aspirin (ASPIRIN LOW DOSE) 81 MG EC tablet Take 81 mg by mouth daily.      . B-D INS SYR ULTRAFINE 1CC/31G 31G X 5/16" 1 ML MISC use as directed FOR ONCE DAILY TESTING 100 each 5  . calcium carbonate (TUMS) 500 MG chewable tablet Chew 1 tablet by mouth as needed.      . cloNIDine (CATAPRES) 0.2 MG tablet Take 1 tablet (0.2 mg total) by mouth at bedtime. 30 tablet 4  . clotrimazole-betamethasone (LOTRISONE) cream Apply 1 application topically 2 (two) times daily. 30 g 0  . esomeprazole (NEXIUM) 40 MG capsule take 1 capsule by mouth once daily BEFORE BREAKFAST 30 capsule 4  . FLUoxetine (PROZAC) 20 MG capsule Take two in the am and one in the evening 90 capsule 2  . Fluticasone-Salmeterol (ADVAIR DISKUS) 250-50 MCG/DOSE AEPB inhale 1 dose by mouth twice a day 60 each 4  . Foot Care Products (DIABETIC INSOLES) MISC by Does not apply route. And Shoes    . furosemide (  LASIX) 40 MG tablet Take 1 tablet (40 mg total) by mouth daily. 30 tablet 4  . glipiZIDE (GLIPIZIDE XL) 2.5 MG 24 hr tablet Take 1 tablet (2.5 mg total) by mouth daily. 30 tablet 4  . Glucose Blood (BAYER BREEZE 2 TEST) DISK by In Vitro route. Lancets and strips for four times a day testing     . guaiFENesin 100 MG/5ML SOLN Take 100 mg by mouth 4 (four) times daily. Reported on 06/18/2015    . HYDROcodone-acetaminophen (NORCO) 10-325 MG tablet One tablet once daily 30 tablet 0  . insulin glargine (LANTUS) 100 UNIT/ML injection 50 units once daily (Patient taking differently: 50 units once daily prn) 30 mL 3  . ipratropium-albuterol (DUONEB) 0.5-2.5 (3) MG/3ML SOLN Take 3 mLs by nebulization 4 (four) times daily as needed.     . levalbuterol (XOPENEX HFA) 45 MCG/ACT inhaler Inhale 1-2 puffs into the lungs as needed.      Marland Kitchen levothyroxine (SYNTHROID, LEVOTHROID) 100 MCG tablet Take 1 tablet (100 mcg total) by mouth daily. 30 tablet 4  . lisinopril-hydrochlorothiazide  (PRINZIDE,ZESTORETIC) 20-12.5 MG tablet take 1 tablet by mouth once daily 30 tablet 4  . meloxicam (MOBIC) 7.5 MG tablet Take 1 tablet (7.5 mg total) by mouth daily. 30 tablet 2  . montelukast (SINGULAIR) 10 MG tablet Take 1 tablet (10 mg total) by mouth at bedtime. 30 tablet 4  . Nystatin (NYAMYC) 100000 UNIT/GM POWD apply to affected area twice a day if needed 60 g 0  . potassium chloride 20 MEQ/15ML (10%) solution TAKE 1 AND 1/2 TEASPOONFUL BY MOUTH ONCE DAILY 240 mL 2  . rosuvastatin (CRESTOR) 20 MG tablet Take 1 tablet (20 mg total) by mouth daily. 30 tablet 4  . Vitamin D, Ergocalciferol, (DRISDOL) 50000 units CAPS capsule Take 1 capsule (50,000 Units total) by mouth every 7 (seven) days. 4 capsule 11  . zolpidem (AMBIEN) 10 MG tablet take 1 tablet by mouth at bedtime for sleep 30 tablet 3   No current facility-administered medications for this visit.    Previous Psychotropic Medications:  Medication Dose   Wellbutrin-caused irritability                       Substance Abuse History in the last 12 months: Substance Age of 1st Use Last Use Amount Specific Type  Nicotine   Smokes 3 packs of cigarettes per day   Alcohol      Cannabis      Opiates      Cocaine      Methamphetamines      LSD      Ecstasy      Benzodiazepines      Caffeine      Inhalants      Others:                          Medical Consequences of Substance Abuse: smoking is making her COPD much worse  Legal Consequences of Substance Abuse: none  Family Consequences of Substance Abuse: none  Blackouts:  No DT's:  No Withdrawal Symptoms:  No None  Social History: Current Place of Residence: [place] of Birth: [place] Family Members: 2 sisters Marital Status:  Widowed Children: none   Relationships:  Education:  Dentist Problems/Performance:  Religious Beliefs/Practices: Christian History of Abuse: none Insurance risk surveyor History:  None. Legal History: none Hobbies/Interests: listening to [employer]  Family  History:   Family History  Problem Relation Age of Onset  . Hypertension Mother   . Stroke Mother   . Cancer Father     lung   . Kidney failure      family history   . Hypertension      family history   . Hypertension Sister   . Depression Sister   . Alcohol abuse Sister   . Hypertension Sister   . Depression Sister   . Diabetes Sister   . Depression Sister   . Cancer Sister     renal     Mental Status Examination/Evaluation: Objective:  Appearance: Casual and Fairly Groomed  using portable oxygen  Eye Contact::  Good  Speech:  Clear and Coherent  Volume:  Decreased  Mood:good  Affect:  Bright  Thought Process:  Circumstantial  Orientation:  Full (Time, Place, and Person)  Thought Content:  Rumination   Suicidal Thoughts:  No  Homicidal Thoughts:  No  Judgement:  Fair  Insight:  Fair  Psychomotor Activity:  Decreased  Akathisia:  No  Handed:  Right  AIMS (if indicated):    Assets:  Communication Skills Desire for Improvement Social Support    Laboratory/X-Ray Psychological Evaluation(s)   reviewed in chart     Assessment:  Axis I: Major Depression, Recurrent severe  AXIS I Major Depression, Recurrent severe  AXIS II Deferred  AXIS III Past Medical History  Diagnosis Date  . DJD (degenerative joint disease)     of the spine   . GERD (gastroesophageal reflux disease)   . Insomnia   . COPD (chronic obstructive pulmonary disease) ([place])   . Obesity   . Anxiety   . Hypothyroidism 2004    left thyroidectomy  . Diabetes mellitus, type 1 1983    type 2 diabetes  . Hypertension 1983  . Hyperlipidemia 2007  . Depression 2008     AXIS IV problems related to social environment  AXIS V 41-50 serious symptoms   Treatment Plan/Recommendations:  Plan of Care: medication management  Laboratory:   Psychotherapy: she is seeing Dr. Jefm Miles  here  Medications: she'll continue Prozac 60 mg daily for depression and Xanax 1 mg 4 times a day for anxiety   Routine PRN Medications:  No  Consultations:   Safety Concerns:  She denies thoughts or plans of self-harm  Other:she'll return in 3 months     Levonne Spiller, MD 5/1/20171:42 PM

## 2015-10-02 NOTE — Assessment & Plan Note (Signed)
[name] is reminded of the importance of commitment to daily physical activity for 30 minutes or more, as able and the need to limit carbohydrate intake to 30 to 60 grams per meal to help with blood sugar control.   The need to take medication as prescribed, test blood sugar as directed, and to call between visits if there is a concern that blood sugar is uncontrolled is also discussed.   [name] is reminded of the importance of daily foot exam, annual eye examination, and good blood sugar, blood pressure and cholesterol control.  Diabetic Labs Latest Ref Rng 06/18/2015 12/26/2014 09/05/2014 05/01/2014 12/14/2013  HbA1c <5.7 % 7.8(H) 7.3(H) 7.3(H) 7.0(H) 7.6(H)  Microalbumin <2.0 mg/dL - 1.6 - - 0.59  Micro/Creat Ratio 0.0 - 30.0 mg/g - 3.8 - - 2.8  Chol 125 - 200 mg/dL 149 111(L) - 116 127  HDL >=46 mg/dL 51 45(L) - 42 50  Calc LDL <130 mg/dL 81 52 - 53 65  Triglycerides <150 mg/dL 83 72 - 103 60  Creatinine 0.50 - 0.99 mg/dL 1.31(H) 1.18(H) 1.12(H) 1.13(H) 1.21(H)   BP/Weight 10/01/2015 06/18/2015 01/22/2015 01/09/2015 12/28/2014 2/42/6834 06/10/6220  Systolic BP 979 892 119 417 408 144 818  Diastolic BP 92 80 70 82 70 78 76  Wt. (Lbs) 230 226 217 209 218 219 206  BMI 40.75 40.04 38.45 37.03 38.63 38.8 35.34  Some encounter information is confidential and restricted. Go to Review Flowsheets activity to see all data.   Foot/eye exam completion dates 06/18/2015 03/09/2014  Foot exam Order - -  Foot Form Completion Done Done   Reports deterioration due to non compliance with diet,  Updated lab needed at/ before next visit.

## 2015-10-02 NOTE — Assessment & Plan Note (Signed)
Deteriorated. Patient re-educated about  the importance of commitment to a  minimum of 150 minutes of exercise per week.  The importance of healthy food choices with portion control discussed. Encouraged to start a food diary, count calories and to consider  joining a support group. Sample diet sheets offered. Goals set by the patient for the next several months.   Weight /BMI 10/01/2015 06/18/2015 01/22/2015  WEIGHT 230 lb 226 lb 217 lb  HEIGHT '5\' 3"'$  '5\' 3"'$  '5\' 3"'$   BMI 40.75 kg/m2 40.04 kg/m2 38.45 kg/m2  Some encounter information is confidential and restricted. Go to Review Flowsheets activity to see all data.    Current exercise per week 30 minutes.

## 2015-10-02 NOTE — Assessment & Plan Note (Signed)
Hyperlipidemia:Low fat diet discussed and encouraged.   Lipid Panel  Lab Results  Component Value Date   CHOL 149 06/18/2015   HDL 51 06/18/2015   LDLCALC 81 06/18/2015   TRIG 83 06/18/2015   CHOLHDL 2.9 06/18/2015   Controlled, no change in medication Updated lab needed at/ before next visit.

## 2015-10-02 NOTE — Assessment & Plan Note (Signed)

## 2015-10-02 NOTE — Assessment & Plan Note (Signed)
Remains focused on poor relationships with her immediate family also focused on death and dying. Not suicidal, but continues to verbalize very poor reasons to want to live, only positive relationship and reason to live is her oldest sister who has cancer, apparently Continue to follow closely with psych

## 2015-10-02 NOTE — Assessment & Plan Note (Signed)
Uncontrolled, start bed time ,muscle relaxant

## 2015-10-02 NOTE — Assessment & Plan Note (Signed)
Worsening due to ongoing nicotine, no change in management, also followed by pulmonary

## 2015-10-02 NOTE — Assessment & Plan Note (Signed)
Marked improvement following change in med management

## 2015-10-02 NOTE — Assessment & Plan Note (Signed)
Not at goal, no med change DASH diet and commitment to daily physical activity for a minimum of 30 minutes discussed and encouraged, as a part of hypertension management. The importance of attaining a healthy weight is also discussed.  BP/Weight 10/01/2015 06/18/2015 01/22/2015 01/09/2015 12/28/2014 02/01/1114 10/01/800  Systolic BP 233 612 244 975 300 511 021  Diastolic BP 92 80 70 82 70 78 76  Wt. (Lbs) 230 226 217 209 218 219 206  BMI 40.75 40.04 38.45 37.03 38.63 38.8 35.34  Some encounter information is confidential and restricted. Go to Review Flowsheets activity to see all data.

## 2015-10-08 ENCOUNTER — Ambulatory Visit: Payer: Self-pay | Admitting: Family Medicine

## 2015-10-15 ENCOUNTER — Encounter (HOSPITAL_COMMUNITY): Payer: Self-pay | Admitting: Psychology

## 2015-10-15 ENCOUNTER — Ambulatory Visit (INDEPENDENT_AMBULATORY_CARE_PROVIDER_SITE_OTHER): Payer: Medicare Other | Admitting: Psychology

## 2015-10-15 DIAGNOSIS — F333 Major depressive disorder, recurrent, severe with psychotic symptoms: Secondary | ICD-10-CM

## 2015-10-15 DIAGNOSIS — F4001 Agoraphobia with panic disorder: Secondary | ICD-10-CM

## 2015-10-15 NOTE — Progress Notes (Signed)
PROGRESS NOTE  Patient:  [name]   DOB: 1950/11/01  MR Number: [id number]  Location: [facility] [address] [place] [address] Dept: [phone]  Start: 9 AM End: 10 AM  Provider/Observer:     Edgardo Roys PSYD  Chief Complaint:      Chief Complaint  Patient presents with  . Panic Attack  . Anxiety  . Depression    Reason For Service:     The patient was referred for psychotherapeutic interventions because of ongoing symptoms of depression, anxiety, and mood swings including anger outbursts particularly of a verbal nature.  Interventions Strategy:  Cognitive/behavioral psychotherapeutic interventions  Participation Level:   Active  Participation Quality:  Appropriate      Behavioral Observation:  Well Groomed, Alert, and Appropriate.   Current Psychosocial Factors: The patient reports that her sister has been told she may have a Metastasis of her lung cancer to her liver. The patient reports that her sister has coped with it well and the patient has been trying to just be supportive of her rather than trying to control the situation. She reports that HER-2 nieces told her information this time which was a change from past interactions which was quite helpful. her Content of Session:   Review current symptoms and continued work on therapeutic interventions for issues of recurrent depression and panic attacks.  Current Status:   The patient reports that she has been actively working on some coping strategies we have been implementing particularly around coping with the scenario with her sister. She reports this is also made her really think about her own mortality and she reports that she feels like she is coming to grips with the fact that her pulmonary function will eventually take her life and that her lung capacity is now 50% but that it is not something that is  stressing her on a day-to-day basis. She reports that with this work that she has had less frequent panic attacks but does report that if her blood oxygen levels get really low that she is much more likely to have a panic attack.  Patient Progress:   The patient reports that she had started doing much worse.  She felt it was related to side effects of Rexulti and she stopped that medication on her own.  Target Goals:   Target goals have to do with reducing the intensity, duration, and frequency of depressive events as well as reducing the intensity, duration, and frequency of panic attacks. Target goals include building coping skills and strategies around both of these diagnostic issues.  Last Reviewed:   10/15/2015  Goals Addressed Today:    Specific goals addressed today had to do with managing and coping with her cognitive and behavioral responses to stressors particularly around issues that may lead to full-blown panic attacks when she is in medical situations. These have been particular triggers for her in the past for panic attacks due to her medical illnesses and past medical treatments.  Impression/Diagnosis:   The patient has had a lot of significant medical issues to deal with particularly regarding pulmonary function. However, she also has a long history of depression and panic attacks have become exacerbated due to to persistent and serious medical issues.  Diagnosis:    Axis I:  Severe recurrent major depressive disorder with psychotic features (HCC)  Panic disorder with agoraphobia and moderate panic attacks  Axis II: No diagnosis     Tedric Leeth R, PsyD 10/15/2015

## 2015-10-30 ENCOUNTER — Other Ambulatory Visit: Payer: Self-pay

## 2015-10-30 DIAGNOSIS — M47899 Other spondylosis, site unspecified: Secondary | ICD-10-CM

## 2015-10-30 MED ORDER — HYDROCODONE-ACETAMINOPHEN 10-325 MG PO TABS: ORAL_TABLET | ORAL | Status: DC

## 2015-11-01 ENCOUNTER — Other Ambulatory Visit: Payer: Self-pay

## 2015-11-01 MED ORDER — ROSUVASTATIN CALCIUM 20 MG PO TABS: 20.0000 mg | ORAL_TABLET | Freq: Every day | ORAL | Status: DC

## 2015-11-01 MED ORDER — ZOLPIDEM TARTRATE 10 MG PO TABS: ORAL_TABLET | ORAL | Status: DC

## 2015-11-19 ENCOUNTER — Ambulatory Visit (INDEPENDENT_AMBULATORY_CARE_PROVIDER_SITE_OTHER): Payer: Medicare Other | Admitting: Psychology

## 2015-11-19 DIAGNOSIS — F4001 Agoraphobia with panic disorder: Secondary | ICD-10-CM

## 2015-11-19 DIAGNOSIS — F333 Major depressive disorder, recurrent, severe with psychotic symptoms: Secondary | ICD-10-CM | POA: Diagnosis not present

## 2015-11-28 ENCOUNTER — Telehealth: Payer: Self-pay | Admitting: Family Medicine

## 2015-11-28 ENCOUNTER — Other Ambulatory Visit: Payer: Self-pay

## 2015-11-28 DIAGNOSIS — M47899 Other spondylosis, site unspecified: Secondary | ICD-10-CM

## 2015-11-28 MED ORDER — HYDROCODONE-ACETAMINOPHEN 10-325 MG PO TABS
ORAL_TABLET | ORAL | Status: DC
Start: 2015-11-28 — End: 2015-12-13

## 2015-11-28 NOTE — Telephone Encounter (Signed)
Opened in error

## 2015-12-03 ENCOUNTER — Telehealth (HOSPITAL_COMMUNITY): Payer: Self-pay | Admitting: *Deleted

## 2015-12-03 ENCOUNTER — Other Ambulatory Visit: Payer: Self-pay

## 2015-12-03 MED ORDER — MONTELUKAST SODIUM 10 MG PO TABS: 10.0000 mg | ORAL_TABLET | Freq: Every day | ORAL | Status: DC

## 2015-12-03 MED ORDER — LISINOPRIL-HYDROCHLOROTHIAZIDE 20-12.5 MG PO TABS: 1.0000 | ORAL_TABLET | Freq: Every day | ORAL | Status: DC

## 2015-12-03 MED ORDER — MELOXICAM 7.5 MG PO TABS: 7.5000 mg | ORAL_TABLET | Freq: Every day | ORAL | Status: DC

## 2015-12-03 NOTE — Telephone Encounter (Signed)
phone call from patient, she need refills of Prozac and Xanax.

## 2015-12-05 ENCOUNTER — Other Ambulatory Visit: Payer: Self-pay

## 2015-12-05 MED ORDER — CLONIDINE HCL 0.2 MG PO TABS: 0.2000 mg | ORAL_TABLET | Freq: Every day | ORAL | Status: DC

## 2015-12-05 NOTE — Telephone Encounter (Signed)
Pt called office back. Spoke with pt and informed her that she should not be out of refills for both script. Informed pt that provider last filled medications on 10-01-15 with 2 refills. Per pt, the pharmacy already gived her Prozac but not her Xanax. Per pt, she gived printed script to pharmacy. Informed pt to call pharmacy and ask them to look on her hold profile and see if they have it and so they can fill medication for her and pt agreed to call pharmacy.

## 2015-12-05 NOTE — Telephone Encounter (Signed)
Called pt and lmtcb due to previous call. Office number provided

## 2015-12-13 ENCOUNTER — Other Ambulatory Visit: Payer: Self-pay

## 2015-12-13 DIAGNOSIS — M47899 Other spondylosis, site unspecified: Secondary | ICD-10-CM

## 2015-12-13 MED ORDER — HYDROCODONE-ACETAMINOPHEN 10-325 MG PO TABS: ORAL_TABLET | ORAL | Status: DC

## 2015-12-27 ENCOUNTER — Other Ambulatory Visit: Payer: Self-pay | Admitting: Family Medicine

## 2015-12-27 DIAGNOSIS — E559 Vitamin D deficiency, unspecified: Secondary | ICD-10-CM | POA: Diagnosis not present

## 2015-12-27 DIAGNOSIS — E119 Type 2 diabetes mellitus without complications: Secondary | ICD-10-CM | POA: Diagnosis not present

## 2015-12-27 DIAGNOSIS — Z794 Long term (current) use of insulin: Secondary | ICD-10-CM | POA: Diagnosis not present

## 2015-12-27 DIAGNOSIS — D539 Nutritional anemia, unspecified: Secondary | ICD-10-CM | POA: Diagnosis not present

## 2015-12-27 DIAGNOSIS — I10 Essential (primary) hypertension: Secondary | ICD-10-CM | POA: Diagnosis not present

## 2015-12-27 LAB — CBC
HCT: 36.4 % (ref 35.0–45.0)
Hemoglobin: 11.6 g/dL — ABNORMAL LOW (ref 11.7–15.5)
MCH: 30.1 pg (ref 27.0–33.0)
MCHC: 31.9 g/dL — ABNORMAL LOW (ref 32.0–36.0)
MCV: 94.3 fL (ref 80.0–100.0)
MPV: 8.6 fL (ref 7.5–12.5)
PLATELETS: 270 10*3/uL (ref 140–400)
RBC: 3.86 MIL/uL (ref 3.80–5.10)
RDW: 14.1 % (ref 11.0–15.0)
WBC: 15.3 10*3/uL — ABNORMAL HIGH (ref 3.8–10.8)

## 2015-12-28 ENCOUNTER — Other Ambulatory Visit: Payer: Self-pay

## 2015-12-28 ENCOUNTER — Ambulatory Visit (INDEPENDENT_AMBULATORY_CARE_PROVIDER_SITE_OTHER): Payer: Medicare Other | Admitting: Family Medicine

## 2015-12-28 ENCOUNTER — Encounter: Payer: Self-pay | Admitting: Family Medicine

## 2015-12-28 VITALS — BP 108/62 | HR 100 | Resp 20 | Ht 63.0 in | Wt 247.0 lb

## 2015-12-28 DIAGNOSIS — Z23 Encounter for immunization: Secondary | ICD-10-CM | POA: Diagnosis not present

## 2015-12-28 DIAGNOSIS — IMO0001 Reserved for inherently not codable concepts without codable children: Secondary | ICD-10-CM

## 2015-12-28 DIAGNOSIS — R928 Other abnormal and inconclusive findings on diagnostic imaging of breast: Secondary | ICD-10-CM

## 2015-12-28 DIAGNOSIS — I10 Essential (primary) hypertension: Secondary | ICD-10-CM

## 2015-12-28 DIAGNOSIS — Z0000 Encounter for general adult medical examination without abnormal findings: Secondary | ICD-10-CM | POA: Diagnosis not present

## 2015-12-28 DIAGNOSIS — F17200 Nicotine dependence, unspecified, uncomplicated: Secondary | ICD-10-CM

## 2015-12-28 DIAGNOSIS — E038 Other specified hypothyroidism: Secondary | ICD-10-CM

## 2015-12-28 DIAGNOSIS — Z794 Long term (current) use of insulin: Secondary | ICD-10-CM

## 2015-12-28 DIAGNOSIS — E119 Type 2 diabetes mellitus without complications: Secondary | ICD-10-CM

## 2015-12-28 DIAGNOSIS — E785 Hyperlipidemia, unspecified: Secondary | ICD-10-CM

## 2015-12-28 LAB — BASIC METABOLIC PANEL WITH GFR
BUN: 23 mg/dL (ref 7–25)
CALCIUM: 8.3 mg/dL — AB (ref 8.6–10.4)
CO2: 27 mmol/L (ref 20–31)
CREATININE: 1.25 mg/dL — AB (ref 0.50–0.99)
Chloride: 105 mmol/L (ref 98–110)
GFR, EST AFRICAN AMERICAN: 53 mL/min — AB (ref >=60)
GFR, Est Non African American: 46 mL/min — ABNORMAL LOW (ref >=60)
Glucose, Bld: 140 mg/dL — ABNORMAL HIGH (ref 65–99)
Potassium: 3.5 mmol/L (ref 3.5–5.3)
SODIUM: 143 mmol/L (ref 135–146)

## 2015-12-28 LAB — HEMOGLOBIN A1C
HEMOGLOBIN A1C: 7.9 % — AB (ref <5.7)
MEAN PLASMA GLUCOSE: 180 mg/dL

## 2015-12-28 LAB — MICROALBUMIN / CREATININE URINE RATIO
Creatinine, Urine: 160 mg/dL (ref 20–320)
Microalb Creat Ratio: 6 mcg/mg creat (ref <30)
Microalb, Ur: 1 mg/dL

## 2015-12-28 LAB — TSH: TSH: 2.42 m[IU]/L

## 2015-12-28 LAB — VITAMIN D 25 HYDROXY (VIT D DEFICIENCY, FRACTURES): VIT D 25 HYDROXY: 33 ng/mL (ref 30–100)

## 2015-12-28 MED ORDER — GLIPIZIDE ER 2.5 MG PO TB24: 2.5000 mg | ORAL_TABLET | Freq: Every day | ORAL | 4 refills | Status: DC

## 2015-12-28 MED ORDER — LEVOTHYROXINE SODIUM 100 MCG PO TABS: 100.0000 ug | ORAL_TABLET | Freq: Every day | ORAL | 4 refills | Status: DC

## 2015-12-28 MED ORDER — MONTELUKAST SODIUM 10 MG PO TABS: 10.0000 mg | ORAL_TABLET | Freq: Every day | ORAL | 4 refills | Status: DC

## 2015-12-28 NOTE — Assessment & Plan Note (Signed)
Patient counseled for approximately 5 minutes regarding the health risks of ongoing nicotine use, specifically all types of cancer, heart disease, stroke and respiratory failure. The options available for help with cessation ,the behavioral changes to assist the process, and the option to either gradully reduce usage  Or abruptly stop.is also discussed. Pt is also encouraged to set specific goals in number of cigarettes used daily, as well as to set a quit date. Current is 20 will aim to reduce to 15

## 2015-12-28 NOTE — Assessment & Plan Note (Signed)
Refuses mammogram

## 2015-12-28 NOTE — Patient Instructions (Addendum)
F/u in 3.5 month, call if you need me before  Fasting lipid, cmp and EGFr, HBa1C and TSH and CBC  in 3.5 months  TdAP and pneumonia 23 today  Rectal exam recommended since you are anemic, which is new, but you still refuse, may return FOB cards if you change your mind call  Cut back to 15 cigarettes daily to improve health please  Discuss medication for depression and anxiety next week with Dr Tenny Craw   You Can Quit Smoking If you are ready to quit smoking or are thinking about it, congratulations! You have chosen to help yourself be healthier and live longer! There are lots of different ways to quit smoking. Nicotine gum, nicotine patches, a nicotine inhaler, or nicotine nasal spray can help with physical craving. Hypnosis, support groups, and medicines help break the habit of smoking. TIPS TO GET OFF AND STAY OFF CIGARETTES  Learn to predict your moods. Do not let a bad situation be your excuse to have a cigarette. Some situations in your life might tempt you to have a cigarette.  Ask friends and co-workers not to smoke around you.  Make your home smoke-free.  Never have "just one" cigarette. It leads to wanting another and another. Remind yourself of your decision to quit.  On a card, make a list of your reasons for not smoking. Read it at least the same number of times a day as you have a cigarette. Tell yourself everyday, "I do not want to smoke. I choose not to smoke."  Ask someone at home or work to help you with your plan to quit smoking.  Have something planned after you eat or have a cup of coffee. Take a walk or get other exercise to perk you up. This will help to keep you from overeating.  Try a relaxation exercise to calm you down and decrease your stress. Remember, you may be tense and nervous the first two weeks after you quit. This will pass.  Find new activities to keep your hands busy. Play with a pen, coin, or rubber band. Doodle or draw things on paper.  Brush your  teeth right after eating. This will help cut down the craving for the taste of tobacco after meals. You can try mouthwash too.  Try gum, breath mints, or diet candy to keep something in your mouth. IF YOU SMOKE AND WANT TO QUIT:  Do not stock up on cigarettes. Never buy a carton. Wait until one pack is finished before you buy another.  Never carry cigarettes with you at work or at home.  Keep cigarettes as far away from you as possible. Leave them with someone else.  Never carry matches or a lighter with you.  Ask yourself, "Do I need this cigarette or is this just a reflex?"  Bet with someone that you can quit. Put cigarette money in a piggy bank every morning. If you smoke, you give up the money. If you do not smoke, by the end of the week, you keep the money.  Keep trying. It takes 21 days to change a habit!  Talk to your doctor about using medicines to help you quit. These include nicotine replacement gum, lozenges, or skin patches.   This information is not intended to replace advice given to you by your health care provider. Make sure you discuss any questions you have with your health care provider.   Document Released: 03/15/2009 Document Revised: 08/11/2011 Document Reviewed: 03/15/2009 Elsevier Interactive Patient Education 2016  [employer].

## 2015-12-28 NOTE — Assessment & Plan Note (Addendum)
Burn to right forearm and bee sting present on right forearm  at visit today, no purulent drainage or evidence o bacterial superinfection After obtaining informed consent, the vaccine is  administered by LPN.

## 2015-12-28 NOTE — Progress Notes (Signed)
Preventive Screening-Counseling & Management   Patient present here today for a Medicare annual wellness visit. C/o burn to right forearm 2 days ago , area sore and red, with skin breakdown also bee sting to right forearm on day of visit  Current Problems (verified)   Medications Prior to Visit Allergies (verified)   PAST HISTORY  Family History (updated)  Social History Widowed for 4 years, no children, disabled since 2006. Has her own apt she stays in on the weekend and stays with her sister during the week    Risk Factors  Current exercise habits:  Exercises at home with her sister a few days a week   Dietary issues discussed: Heart healthy diet, encouraged to limit soft drinks and fried fatty foods and carbs    Cardiac risk factors: Type 2 dm, Nicotine use   Depression Screen  (Note: if answer to either of the following is "Yes", a more complete depression screening is indicated)   Over the past two weeks, have you felt down, depressed or hopeless? Yes, depression uncontrolled Over the past two weeks, have you felt little interest or pleasure in doing things? No  Have you lost interest or pleasure in daily life? No  Do you often feel hopeless? No  Do you cry easily over simple problems? No    Activities of Daily Living  In your present state of health, do you have any difficulty performing the following activities?  Driving?: no Managing money?: No Feeding yourself?:No Getting from bed to chair?:No Climbing a flight of stairs?: can climb a few but has to take her time, tires easily, oxygen dependent , also has chronic arthritis which limits mobility Preparing food and eating?:No Bathing or showering?:has to use shower chair  Getting dressed?:No Getting to the toilet?:No Using the toilet?:No Moving around from place to place?: yes, has to carry oxygen with her, also has arthritis in spine and knees  Fall Risk Assessment In the past year have you fallen or had a near  fall?:No Are you currently taking any medications that make you dizziness?:No   Hearing Difficulties: No Do you often ask people to speak up or repeat themselves?:states she can't hear too well especially in her right ear , no interest in pursuing ENT eval Do you experience ringing or noises in your ears?:No Do you have difficulty understanding soft or whispered voices?:yes  Cognitive Testing  Alert? Yes Normal Appearance?Yes  Oriented to person? Yes Place? Yes  Time? Yes  Displays appropriate judgment?Yes  Can read the correct time from a watch face? yes Are you having problems remembering things?yes, short term memory   Advanced Directives have been discussed with the patient?Yes, DNR    List the Names of Other Physician/Practitioners you currently use: updated   Indicate any recent Medical Services you may have received from other than [facility] providers in the past year (date may be approximate).     Medicare Attestation  I have personally reviewed:  The patient's medical and social history  Their use of alcohol, tobacco or illicit drugs  Their current medications and supplements  The patient's functional ability including ADLs,fall risks, home safety risks, cognitive, and hearing and visual impairment  Diet and physical activities  Evidence for depression or mood disorders  The patient's weight, height, BMI, and visual acuity have been recorded in the chart. I have made referrals, counseling, and provided education to the patient based on review of the above and I have provided the patient with a written  personalized care plan for preventive services.    Physical Exam BP 108/62 (BP Location: Left Arm, Patient Position: Sitting, Cuff Size: Large)   Pulse 100   Resp 20   Ht '5\' 3"'$  (1.6 m)   Wt 247 lb (112 kg)   SpO2 (!) 86%   BMI 43.75 kg/m    Skin: erythema and skin breakdown in area of recent burn, and insect bite, no  purulent drainage, affected area approx 2.5 cm of  erythem  Assessment & Plan:  Medicare annual wellness visit, subsequent Annual exam as documented. Counseling done  re healthy lifestyle involving commitment to 150 minutes exercise per week, heart healthy diet, and attaining healthy weight.The importance of adequate sleep also discussed. Regular seat belt use and home safety, is also discussed. Changes in health habits are decided on by the patient with goals and time frames  set for achieving them. Immunization and cancer screening needs are specifically addressed at this visit.   NICOTINE ADDICTION Patient counseled for approximately 5 minutes regarding the health risks of ongoing nicotine use, specifically all types of cancer, heart disease, stroke and respiratory failure. The options available for help with cessation ,the behavioral changes to assist the process, and the option to either gradully reduce usage  Or abruptly stop.is also discussed. Pt is also encouraged to set specific goals in number of cigarettes used daily, as well as to set a quit date. Current is 20 will aim to reduce to 15    Need for diphtheria-tetanus-pertussis (Tdap) vaccine Burn to right forearm and bee sting present on right forearm  at visit today, no purulent drainage or evidence o bacterial superinfection After obtaining informed consent, the vaccine is  administered by LPN.   Need for 23-polyvalent pneumococcal polysaccharide vaccine After obtaining informed consent, the vaccine is  administered by LPN.   Abnormal mammogram Refuses mammogram

## 2015-12-28 NOTE — Assessment & Plan Note (Signed)

## 2015-12-28 NOTE — Assessment & Plan Note (Addendum)
After obtaining informed consent, the vaccine is  administered by LPN.  

## 2015-12-30 ENCOUNTER — Encounter: Payer: Self-pay | Admitting: Family Medicine

## 2015-12-31 ENCOUNTER — Encounter (HOSPITAL_COMMUNITY): Payer: Self-pay | Admitting: Psychiatry

## 2015-12-31 ENCOUNTER — Ambulatory Visit (INDEPENDENT_AMBULATORY_CARE_PROVIDER_SITE_OTHER): Payer: Medicare Other | Admitting: Psychiatry

## 2015-12-31 VITALS — BP 160/90 | Ht 63.0 in | Wt 248.0 lb

## 2015-12-31 DIAGNOSIS — F333 Major depressive disorder, recurrent, severe with psychotic symptoms: Secondary | ICD-10-CM | POA: Diagnosis not present

## 2015-12-31 LAB — IRON AND TIBC
%SAT: 13 % (ref 11–50)
Iron: 40 ug/dL — ABNORMAL LOW (ref 45–160)
TIBC: 308 ug/dL (ref 250–450)
UIBC: 268 ug/dL (ref 125–400)

## 2015-12-31 LAB — FERRITIN: Ferritin: 80 ng/mL (ref 20–288)

## 2015-12-31 LAB — FOLATE: Folate: 3.5 ng/mL — ABNORMAL LOW (ref >5.4)

## 2015-12-31 LAB — VITAMIN B12: Vitamin B-12: 430 pg/mL (ref 200–1100)

## 2015-12-31 MED ORDER — ALPRAZOLAM 1 MG PO TABS: ORAL_TABLET | ORAL | 2 refills | Status: DC

## 2015-12-31 MED ORDER — FLUOXETINE HCL 20 MG PO CAPS: ORAL_CAPSULE | ORAL | 2 refills | Status: DC

## 2015-12-31 MED ORDER — BREXPIPRAZOLE 1 MG PO TABS
1.0000 mg | ORAL_TABLET | Freq: Every day | ORAL | 2 refills | Status: DC
Start: 2015-12-31 — End: 2016-07-18

## 2015-12-31 NOTE — Progress Notes (Signed)
Patient ID: ALASIA ENGE, female   DOB: January 30, 1951, 65 y.o.   MRN: [id number] Patient ID: SERAYAH YAZDANI, female   DOB: 22-Jan-1951, 65 y.o.   MRN: [id number] Patient ID: SHANIQUA GUILLOT, female   DOB: 04-Nov-1950, 65 y.o.   MRN: [id number] Patient ID: OMARI KOSLOSKY, female   DOB: [birth year], 65 y.o.   MRN: [id number] Patient ID: AASHKA SALOMONE, female   DOB: 04-17-1951, 65 y.o.   MRN: [id number] Patient ID: TAMEKIA ROTTER, female   DOB: 13-Jan-1951, 65 y.o.   MRN: [id number] Patient ID: NIYLA MARONE, female   DOB: 1950/11/29, 65 y.o.   MRN: [id number]  Psychiatric Assessment Adult  Patient Identification:  CAPRISHA BRIDGETT Date of Evaluation:  12/31/2015 Chief Complaint: depression History of Chief Complaint:   Chief Complaint  Patient presents with  . Depression  . Anxiety  . Follow-up    Depression         Associated symptoms include fatigue and appetite change.  Past medical history includes anxiety.   Anxiety  Symptoms include nervous/anxious behavior and shortness of breath.    this patient is a 65 year old widowed black female who lives alone in [place]. She has no children. She is on disability.  The patient was referred by Dr. Moshe Cipro her primary care physician and Dr. Jefm Miles her psychologist for further evaluation and treatment of major depression and anxiety.  The patient states that she's been depressed since her teen years. She's not really sure why. When she was in college she went through severe depression and had to quit. She's had this on and off through the years but really not receive treatment until a couple years ago after her husband died. Her primary care physician has put her on Prozac and Xanax which have helped to some degree.however recently she has been getting more depressed. She has severe COPD and is on oxygen. Yet, she continues to smoke THREE PACKS PER DAY which she claims is secondary to her depressed mood.  Recently she's gone through more  deaths including her sister in 2014 a niece in 2014. Another sister was recently diagnosed with cancer. The patient has gotten overwhelmed by all this and claims it's getting hard for her to just get out of bed and function. She has no energy, she's been shaky and having tremors in her hands, she complains of short-term memory loss.her sleep is variable and Ambien sometimes helps. Her appetite is up and down. Sometimes she hears the voices of her deceased relatives or "feels her spirits." She's never been to a psychiatric hospitalization  But has been seeing Dr. Jefm Miles in our office for several years. She has never been suicidal and states she is very "spiritual and values life. She stays away from people but she has a god niece and  God sister who check on her faithfully.she denies any other psychotic symptoms and does not use drugs or alcohol  The patient returns after 3 months. She states that she has been somewhat more depressed lately and frustrated over her breathing problems. On the other hand she is smoking more and is back up to a pack a day. She states that the younger people in her family depend on her a lot and she is always there to help them out. I suggested that she focus more on her own health and not so much on others. She would like to try rexulti again in addition to Prozac because she thinks at the lower dose it  helped even on the higher dose last time it made her feel jittery. She denies suicidal ideation because of her spirituality Review of Systems  Constitutional: Positive for activity change, appetite change and fatigue.  HENT: Negative.   Eyes: Negative.   Respiratory: Positive for shortness of breath.   Cardiovascular: Negative.   Gastrointestinal: Negative.   Endocrine: Negative.   Genitourinary: Negative.   Musculoskeletal: Positive for arthralgias.  Skin: Negative.   Allergic/Immunologic: Negative.   Neurological: Positive for tremors.  Hematological: Negative.    Psychiatric/Behavioral: Positive for depression, dysphoric mood, hallucinations and sleep disturbance. The patient is nervous/anxious.    Physical Examnot done  Depressive Symptoms: depressed mood, anhedonia, psychomotor retardation, difficulty concentrating, impaired memory, anxiety, disturbed sleep, decreased appetite,  (Hypo) Manic Symptoms:   Elevated Mood:  No Irritable Mood:  No Grandiosity:  No Distractibility:  No Labiality of Mood:  No Delusions:  No Hallucinations:  Yes Impulsivity:  Yes Sexually Inappropriate Behavior:  No Financial Extravagance:  No Flight of Ideas:  No  Anxiety Symptoms: Excessive Worry:  Yes Panic Symptoms:  No Agoraphobia:  No Obsessive Compulsive: No  Symptoms: None, Specific Phobias:  No Social Anxiety:  No  Psychotic Symptoms:  Hallucinations: Yes Auditory Delusions:  No Paranoia:  No   Ideas of Reference:  No  PTSD Symptoms: Ever had a traumatic exposure:  No Had a traumatic exposure in the last month:  No Re-experiencing: No None Hypervigilance:  No Hyperarousal: No None Avoidance: No None  Traumatic Brain Injury: No   Past Psychiatric History: Diagnosis:Maj. depression  Hospitalizations: none  Outpatient Care: has been seeing Dr. Jefm Miles in our office for counseling  Substance Abuse Care: none  Self-Mutilation: none  Suicidal Attempts:none  Violent Behaviors: none   Past Medical History:   Past Medical History:  Diagnosis Date  . Anxiety   . COPD (chronic obstructive pulmonary disease) ([place])   . Depression 2008  . Diabetes mellitus, type 1 1983   type 2 diabetes  . DJD (degenerative joint disease)    of the spine   . GERD (gastroesophageal reflux disease)   . Hyperlipidemia 2007  . Hypertension 1983  . Hypothyroidism 2004   left thyroidectomy  . Insomnia   . Obesity    History of Loss of Consciousness:  No Seizure History:  No Cardiac History:  No Allergies:   Allergies  Allergen Reactions   . Codeine     REACTION: nausea   Current Medications:  Current Outpatient Prescriptions  Medication Sig Dispense Refill  . ALPRAZolam (XANAX) 1 MG tablet TAKE ONE TABLET FOUR TIMES A DAY 120 tablet 2  . amLODipine (NORVASC) 5 MG tablet Take 1 tablet (5 mg total) by mouth daily. 30 tablet 5  . aspirin (ASPIRIN LOW DOSE) 81 MG EC tablet Take 81 mg by mouth daily.      . B-D INS SYR ULTRAFINE 1CC/31G 31G X 5/16" 1 ML MISC use as directed FOR ONCE DAILY TESTING 100 each 5  . Brexpiprazole (REXULTI) 1 MG TABS Take 1 mg by mouth at bedtime. 30 tablet 2  . calcium carbonate (TUMS) 500 MG chewable tablet Chew 1 tablet by mouth as needed.      . cloNIDine (CATAPRES) 0.2 MG tablet Take 1 tablet (0.2 mg total) by mouth at bedtime. 30 tablet 4  . clotrimazole-betamethasone (LOTRISONE) cream Apply 1 application topically 2 (two) times daily. 30 g 0  . cyclobenzaprine (FLEXERIL) 10 MG tablet Take 1 tablet (10 mg total) by mouth at bedtime.  30 tablet 5  . esomeprazole (NEXIUM) 40 MG capsule take 1 capsule by mouth once daily BEFORE BREAKFAST 30 capsule 4  . FLUoxetine (PROZAC) 20 MG capsule Take two in the am and one in the evening 90 capsule 2  . Fluticasone-Salmeterol (ADVAIR DISKUS) 250-50 MCG/DOSE AEPB inhale 1 dose by mouth twice a day 60 each 4  . Foot Care Products (DIABETIC INSOLES) MISC by Does not apply route. And Shoes    . furosemide (LASIX) 40 MG tablet Take 1 tablet (40 mg total) by mouth daily. (Patient not taking: Reported on 12/28/2015) 30 tablet 4  . glipiZIDE (GLIPIZIDE XL) 2.5 MG 24 hr tablet Take 1 tablet (2.5 mg total) by mouth daily. 30 tablet 4  . Glucose Blood (BAYER BREEZE 2 TEST) DISK by In Vitro route. Lancets and strips for four times a day testing     . guaiFENesin 100 MG/5ML SOLN Take 100 mg by mouth 4 (four) times daily. Reported on 06/18/2015    . HYDROcodone-acetaminophen (NORCO) 10-325 MG tablet One tablet once daily 30 tablet 0  . insulin glargine (LANTUS) 100 UNIT/ML  injection 50 units once daily (Patient taking differently: 50 units once daily prn) 30 mL 3  . ipratropium-albuterol (DUONEB) 0.5-2.5 (3) MG/3ML SOLN Take 3 mLs by nebulization 4 (four) times daily as needed.     . levalbuterol (XOPENEX HFA) 45 MCG/ACT inhaler Inhale 1-2 puffs into the lungs as needed.      Marland Kitchen levothyroxine (SYNTHROID, LEVOTHROID) 100 MCG tablet Take 1 tablet (100 mcg total) by mouth daily. 30 tablet 4  . lisinopril-hydrochlorothiazide (PRINZIDE,ZESTORETIC) 20-12.5 MG tablet Take 1 tablet by mouth daily. 30 tablet 4  . meloxicam (MOBIC) 7.5 MG tablet Take 1 tablet (7.5 mg total) by mouth daily. 30 tablet 4  . montelukast (SINGULAIR) 10 MG tablet Take 1 tablet (10 mg total) by mouth at bedtime. 30 tablet 4  . potassium chloride 20 MEQ/15ML (10%) solution TAKE 1 AND 1/2 TEASPOONFUL BY MOUTH ONCE DAILY 240 mL 2  . rosuvastatin (CRESTOR) 20 MG tablet Take 1 tablet (20 mg total) by mouth daily. 30 tablet 4  . Vitamin D, Ergocalciferol, (DRISDOL) 50000 units CAPS capsule Take 1 capsule (50,000 Units total) by mouth every 7 (seven) days. 4 capsule 11  . zolpidem (AMBIEN) 10 MG tablet take 1 tablet by mouth at bedtime for sleep 30 tablet 3   No current facility-administered medications for this visit.     Previous Psychotropic Medications:  Medication Dose   Wellbutrin-caused irritability                       Substance Abuse History in the last 12 months: Substance Age of 1st Use Last Use Amount Specific Type  Nicotine   Smokes 3 packs of cigarettes per day   Alcohol      Cannabis      Opiates      Cocaine      Methamphetamines      LSD      Ecstasy      Benzodiazepines      Caffeine      Inhalants      Others:                          Medical Consequences of Substance Abuse: smoking is making her COPD much worse  Legal Consequences of Substance Abuse: none  Family Consequences of Substance Abuse: none  Blackouts:  No  DT's:  No Withdrawal Symptoms:  No  None  Social History: Current Place of Residence: [place] of Birth: [place] Family Members: 2 sisters Marital Status:  Widowed Children: none   Relationships:  Education:  Dentist Problems/Performance:  Religious Beliefs/Practices: Christian History of Abuse: none Energy manager History:  None. Legal History: none Hobbies/Interests: listening to gospel music  Family History:   Family History  Problem Relation Age of Onset  . Hypertension Mother   . Stroke Mother   . Cancer Father     lung   . Hypertension Sister   . Depression Sister   . Alcohol abuse Sister   . Hypertension Sister   . Depression Sister   . Cancer Sister   . Diabetes Sister   . Depression Sister   . Cancer Sister     renal   . Kidney failure Brother   . Kidney failure      family history   . Hypertension      family history     Mental Status Examination/Evaluation: Objective:  Appearance: Casual and Fairly Groomed  using portable oxygen,Walking very slowly   Eye Contact::  Good  Speech:  Clear and Coherent  Volume:  Decreased  Mood:good  Affect:  Bright  Thought Process:  Circumstantial  Orientation:  Full (Time, Place, and Person)  Thought Content:  Rumination   Suicidal Thoughts:  No  Homicidal Thoughts:  No  Judgement:  Fair  Insight:  Fair  Psychomotor Activity:  Decreased  Akathisia:  No  Handed:  Right  AIMS (if indicated):    Assets:  Communication Skills Desire for Improvement Social Support    Laboratory/X-Ray Psychological Evaluation(s)   reviewed in chart     Assessment:  Axis I: Major Depression, Recurrent severe  AXIS I Major Depression, Recurrent severe  AXIS II Deferred  AXIS III Past Medical History:  Diagnosis Date  . Anxiety   . COPD (chronic obstructive pulmonary disease) ([place])   . Depression 2008  . Diabetes mellitus, type 1 1983   type 2 diabetes  . DJD (degenerative joint  disease)    of the spine   . GERD (gastroesophageal reflux disease)   . Hyperlipidemia 2007  . Hypertension 1983  . Hypothyroidism 2004   left thyroidectomy  . Insomnia   . Obesity      AXIS IV problems related to social environment  AXIS V 41-50 serious symptoms   Treatment Plan/Recommendations:  Plan of Care: medication management  Laboratory:   Psychotherapy: she is seeing Dr. Jefm Miles here  Medications: she'll continue Prozac 60 mg daily for depression and Xanax 1 mg 4 times a day for anxiety.She will start rexulti at 0.5 mg for 1 week and then advance to 1 mg daily   Routine PRN Medications:  No  Consultations:   Safety Concerns:  She denies thoughts or plans of self-harm  Other:she'll return in 6 weeks     Levonne Spiller, MD 7/31/20171:38 PM    Patient ID: Maris Berger, female   DOB: [birth year], 65 y.o.   MRN: [id number]

## 2016-01-01 ENCOUNTER — Telehealth: Payer: Self-pay

## 2016-01-01 DIAGNOSIS — D539 Nutritional anemia, unspecified: Secondary | ICD-10-CM

## 2016-01-01 NOTE — Telephone Encounter (Signed)
-----   Message from Fayrene Helper, MD sent at 01/01/2016  9:15 AM EDT ----- Needs to take iron 325 mg one daily and folic acid 1 mg daily , both are OTC, will need rept cbc and anemia panel with next set of labs

## 2016-01-01 NOTE — Telephone Encounter (Signed)
Lab order mailed.

## 2016-01-03 ENCOUNTER — Other Ambulatory Visit: Payer: Self-pay | Admitting: Family Medicine

## 2016-01-03 DIAGNOSIS — Z794 Long term (current) use of insulin: Principal | ICD-10-CM

## 2016-01-03 DIAGNOSIS — E1165 Type 2 diabetes mellitus with hyperglycemia: Principal | ICD-10-CM

## 2016-01-03 DIAGNOSIS — IMO0001 Reserved for inherently not codable concepts without codable children: Secondary | ICD-10-CM

## 2016-01-07 ENCOUNTER — Ambulatory Visit (HOSPITAL_COMMUNITY): Payer: Medicare Other | Admitting: Psychology

## 2016-01-09 ENCOUNTER — Emergency Department (HOSPITAL_COMMUNITY)
Admission: EM | Admit: 2016-01-09 | Discharge: 2016-01-09 | Disposition: A | Discharge status: Home / Self Care | Payer: Medicare Other | Attending: Emergency Medicine | Admitting: Emergency Medicine

## 2016-01-09 ENCOUNTER — Encounter (HOSPITAL_COMMUNITY): Payer: Self-pay | Admitting: *Deleted

## 2016-01-09 DIAGNOSIS — S81812A Laceration without foreign body, left lower leg, initial encounter: Secondary | ICD-10-CM | POA: Diagnosis not present

## 2016-01-09 DIAGNOSIS — I10 Essential (primary) hypertension: Secondary | ICD-10-CM | POA: Insufficient documentation

## 2016-01-09 DIAGNOSIS — E119 Type 2 diabetes mellitus without complications: Secondary | ICD-10-CM | POA: Diagnosis not present

## 2016-01-09 DIAGNOSIS — Z794 Long term (current) use of insulin: Secondary | ICD-10-CM | POA: Diagnosis not present

## 2016-01-09 DIAGNOSIS — Z7982 Long term (current) use of aspirin: Secondary | ICD-10-CM | POA: Insufficient documentation

## 2016-01-09 DIAGNOSIS — S81812D Laceration without foreign body, left lower leg, subsequent encounter: Secondary | ICD-10-CM | POA: Diagnosis not present

## 2016-01-09 DIAGNOSIS — Z79899 Other long term (current) drug therapy: Secondary | ICD-10-CM | POA: Insufficient documentation

## 2016-01-09 DIAGNOSIS — J449 Chronic obstructive pulmonary disease, unspecified: Secondary | ICD-10-CM | POA: Insufficient documentation

## 2016-01-09 DIAGNOSIS — M79605 Pain in left leg: Secondary | ICD-10-CM | POA: Diagnosis present

## 2016-01-09 DIAGNOSIS — W208XXD Other cause of strike by thrown, projected or falling object, subsequent encounter: Secondary | ICD-10-CM | POA: Diagnosis not present

## 2016-01-09 DIAGNOSIS — E039 Hypothyroidism, unspecified: Secondary | ICD-10-CM | POA: Insufficient documentation

## 2016-01-09 DIAGNOSIS — F17210 Nicotine dependence, cigarettes, uncomplicated: Secondary | ICD-10-CM | POA: Insufficient documentation

## 2016-01-09 DIAGNOSIS — IMO0002 Reserved for concepts with insufficient information to code with codable children: Secondary | ICD-10-CM

## 2016-01-09 DIAGNOSIS — Z7984 Long term (current) use of oral hypoglycemic drugs: Secondary | ICD-10-CM | POA: Diagnosis not present

## 2016-01-09 LAB — BASIC METABOLIC PANEL
Anion gap: 6 (ref 5–15)
BUN: 25 mg/dL — AB (ref 6–20)
CHLORIDE: 106 mmol/L (ref 101–111)
CO2: 27 mmol/L (ref 22–32)
Calcium: 9.1 mg/dL (ref 8.9–10.3)
Creatinine, Ser: 1.16 mg/dL — ABNORMAL HIGH (ref 0.44–1.00)
GFR calc Af Amer: 57 mL/min — ABNORMAL LOW (ref >60)
GFR calc non Af Amer: 49 mL/min — ABNORMAL LOW (ref >60)
GLUCOSE: 111 mg/dL — AB (ref 65–99)
POTASSIUM: 4.5 mmol/L (ref 3.5–5.1)
Sodium: 139 mmol/L (ref 135–145)

## 2016-01-09 LAB — CBC WITH DIFFERENTIAL/PLATELET
Basophils Absolute: 0 10*3/uL (ref 0.0–0.1)
Basophils Relative: 0 %
Eosinophils Absolute: 1.2 10*3/uL — ABNORMAL HIGH (ref 0.0–0.7)
Eosinophils Relative: 11 %
HEMATOCRIT: 37.7 % (ref 36.0–46.0)
HEMOGLOBIN: 12 g/dL (ref 12.0–15.0)
LYMPHS ABS: 2.5 10*3/uL (ref 0.7–4.0)
Lymphocytes Relative: 24 %
MCH: 31 pg (ref 26.0–34.0)
MCHC: 31.8 g/dL (ref 30.0–36.0)
MCV: 97.4 fL (ref 78.0–100.0)
Monocytes Absolute: 0.6 10*3/uL (ref 0.1–1.0)
Monocytes Relative: 6 %
NEUTROS PCT: 59 %
Neutro Abs: 6 10*3/uL (ref 1.7–7.7)
Platelets: 267 10*3/uL (ref 150–400)
RBC: 3.87 MIL/uL (ref 3.87–5.11)
RDW: 14.3 % (ref 11.5–15.5)
WBC: 10.3 10*3/uL (ref 4.0–10.5)

## 2016-01-09 MED ORDER — SILVER SULFADIAZINE 1 % EX CREA: 1.0000 "application " | TOPICAL_CREAM | Freq: Every day | CUTANEOUS | 0 refills | Status: DC

## 2016-01-09 MED ORDER — SILVER SULFADIAZINE 1 % EX CREA
TOPICAL_CREAM | Freq: Once | CUTANEOUS | Status: AC
  Administered 2016-01-09: 13:00:00 via TOPICAL
  Filled 2016-01-09: qty 50

## 2016-01-09 MED ORDER — ACETAMINOPHEN 325 MG PO TABS
650.0000 mg | ORAL_TABLET | Freq: Once | ORAL | Status: AC
  Administered 2016-01-09: 650 mg via ORAL
  Filled 2016-01-09: qty 2

## 2016-01-09 NOTE — ED Triage Notes (Signed)
Pt states that she had her oxygen tank fall on her leg last week. She has been trying to keep this area clean at home but states she has had continued pain and weeping of this area. She has area covered in triage with secretions coming through the wrapping. NAD noted.

## 2016-01-09 NOTE — Discharge Instructions (Signed)
Keep wound clean and dry.   Apply ointment after each cleaning.   Follow up with your physician Dr. Moshe Cipro if any worsening signs of infection including redness or drainage present.

## 2016-01-09 NOTE — ED Provider Notes (Signed)
[facility] DEPT Provider Note   CSN: [id number] Arrival date & time: 01/09/16  1022  First Provider Contact:  None       History   Chief Complaint Chief Complaint  Patient presents with  . Leg Pain    HPI [name] is a 65 y.o. female. She resists evaluation of a laceration on her left leg. She states her oxygen tank fell over a week ago and has laceration on her lower leg. She has been "doctoring" using soap and simple dressings. She has chronic lymphedema in her legs. States it continues to drain fluid and soaking yellow fluid through her dressings.  No fever. She does not feel as though it has become more red. Does not drink anything purulent. No red streaks up her leg. Does not feel poorly in general with fevers chills shakes Reiter's.  HPI  Past Medical History:  Diagnosis Date  . Anxiety   . COPD (chronic obstructive pulmonary disease) ([place])   . Depression 2008  . Diabetes mellitus, type 1 1983   type 2 diabetes  . DJD (degenerative joint disease)    of the spine   . GERD (gastroesophageal reflux disease)   . Hyperlipidemia 2007  . Hypertension 1983  . Hypothyroidism 2004   left thyroidectomy  . Insomnia   . Obesity     Patient Active Problem List   Diagnosis Date Noted  . Anemia, deficiency 01/01/2016  . Need for diphtheria-tetanus-pertussis (Tdap) vaccine 12/28/2015  . Need for 23-polyvalent pneumococcal polysaccharide vaccine 12/28/2015  . Back spasm 10/01/2015  . DNR no code (do not resuscitate) 06/18/2015  . Encounter for medication management 02/25/2015  . Excessive daytime sleepiness 12/30/2014  . Abnormal mammogram 12/30/2014  . Medicare annual wellness visit, subsequent 11/29/2014  . Auditory hallucinations 12/18/2013  . Chronic respiratory failure with hypoxia ([place]) 12/15/2013  . Hearing loss 1Oct 10, 202014  . Oxygen dependent 11/19/2012  . Carotid bruit 04/12/2012  . Diabetes mellitus, insulin dependent (IDDM), controlled ([place])  08/04/2011  . FATIGUE 02/20/2009  . NICOTINE ADDICTION 12/31/2008  . ALLERGIC RHINITIS CAUSE UNSPECIFIED 10/07/2008  . Essential hypertension 03/07/2008  . UNSPECIFIED PERIPHERAL VASCULAR DISEASE 03/06/2008  . Hypothyroidism 09/08/2007  . Hyperlipidemia LDL goal <100 09/08/2007  . Morbid obesity ([place]) 09/08/2007  . Anxiety and depression 09/08/2007  . COPD, severe ([place]) 09/08/2007  . GERD 09/08/2007  . Osteoarthritis of spine 09/08/2007  . Insomnia 09/08/2007    Past Surgical History:  Procedure Laterality Date  . APPENDECTOMY    . bilatal great toenail removed , complicated by MRSA in rigt toenail  May 2011  . TONSILLECTOMY  childhood   . TOTAL ABDOMINAL HYSTERECTOMY W/ BILATERAL SALPINGOOPHORECTOMY    . tracheotomy secondary to failed attempt and lung biopsy      OB History    No data available       Home Medications    Prior to Admission medications   Medication Sig Start Date End Date Taking? Authorizing Provider  acetaminophen (TYLENOL) 325 MG tablet Take 650 mg by mouth every 6 (six) hours as needed.   Yes Historical Provider, MD  ALPRAZolam Duanne Moron) 1 MG tablet TAKE ONE TABLET FOUR TIMES A DAY 12/31/15  Yes Cloria Spring, MD  amLODipine (NORVASC) 5 MG tablet Take 1 tablet (5 mg total) by mouth daily. 10/01/15  Yes Fayrene Helper, MD  aspirin (ASPIRIN LOW DOSE) 81 MG EC tablet Take 81 mg by mouth daily.     Yes Historical Provider, MD  B-D INS  SYR ULTRAFINE 1CC/31G 31G X 5/16" 1 ML MISC use as directed FOR ONCE DAILY TESTING 09/02/12  Yes Fayrene Helper, MD  Brexpiprazole (REXULTI) 1 MG TABS Take 1 mg by mouth at bedtime. 12/31/15  Yes Cloria Spring, MD  calcium carbonate (TUMS) 500 MG chewable tablet Chew 1 tablet by mouth as needed.     Yes Historical Provider, MD  cloNIDine (CATAPRES) 0.2 MG tablet Take 1 tablet (0.2 mg total) by mouth at bedtime. 12/05/15  Yes Fayrene Helper, MD  clotrimazole-betamethasone (LOTRISONE) cream Apply 1 application topically 2  (two) times daily. 06/18/15  Yes Fayrene Helper, MD  cyclobenzaprine (FLEXERIL) 10 MG tablet Take 1 tablet (10 mg total) by mouth at bedtime. 10/01/15  Yes Fayrene Helper, MD  esomeprazole (NEXIUM) 40 MG capsule take 1 capsule by mouth once daily BEFORE BREAKFAST 10/01/15  Yes Fayrene Helper, MD  FLUoxetine (PROZAC) 20 MG capsule Take two in the am and one in the evening 12/31/15  Yes Cloria Spring, MD  Fluticasone-Salmeterol (ADVAIR DISKUS) 250-50 MCG/DOSE AEPB inhale 1 dose by mouth twice a day 04/09/15  Yes Fayrene Helper, MD  Ammon (DIABETIC INSOLES) MISC by Does not apply route. And Shoes   Yes Historical Provider, MD  furosemide (LASIX) 40 MG tablet Take 1 tablet (40 mg total) by mouth daily. 08/07/15  Yes Fayrene Helper, MD  glipiZIDE (GLIPIZIDE XL) 2.5 MG 24 hr tablet Take 1 tablet (2.5 mg total) by mouth daily. 12/28/15  Yes Fayrene Helper, MD  Glucose Blood (BAYER BREEZE 2 TEST) DISK by In Vitro route. Lancets and strips for four times a day testing    Yes Historical Provider, MD  guaiFENesin 100 MG/5ML SOLN Take 100 mg by mouth 4 (four) times daily. Reported on 06/18/2015   Yes Historical Provider, MD  HYDROcodone-acetaminophen [facility]) 10-325 MG tablet One tablet once daily 12/13/15  Yes Fayrene Helper, MD  ipratropium-albuterol (DUONEB) 0.5-2.5 (3) MG/3ML SOLN Take 3 mLs by nebulization 4 (four) times daily as needed.    Yes Historical Provider, MD  LANTUS 100 UNIT/ML injection INJECT 50 UNITS SUBCUTANEOUSLY ONCE DAILY 01/04/16  Yes Fayrene Helper, MD  levalbuterol [facility]) 45 MCG/ACT inhaler Inhale 1-2 puffs into the lungs as needed.     Yes Historical Provider, MD  levothyroxine (SYNTHROID, LEVOTHROID) 100 MCG tablet Take 1 tablet (100 mcg total) by mouth daily. 12/28/15  Yes Fayrene Helper, MD  lisinopril-hydrochlorothiazide (PRINZIDE,ZESTORETIC) 20-12.5 MG tablet Take 1 tablet by mouth daily. 12/03/15  Yes Fayrene Helper, MD  meloxicam (MOBIC)  7.5 MG tablet Take 1 tablet (7.5 mg total) by mouth daily. 12/03/15  Yes Fayrene Helper, MD  montelukast (SINGULAIR) 10 MG tablet Take 1 tablet (10 mg total) by mouth at bedtime. 12/28/15  Yes Fayrene Helper, MD  potassium chloride 20 MEQ/15ML (10%) solution TAKE 1 AND 1/2 TEASPOONFUL BY MOUTH ONCE DAILY   Yes Fayrene Helper, MD  rosuvastatin (CRESTOR) 20 MG tablet Take 1 tablet (20 mg total) by mouth daily. 11/01/15  Yes Fayrene Helper, MD  Vitamin D, Ergocalciferol, (DRISDOL) 50000 units CAPS capsule Take 1 capsule (50,000 Units total) by mouth every 7 (seven) days. 06/20/15  Yes Fayrene Helper, MD  zolpidem (AMBIEN) 10 MG tablet take 1 tablet by mouth at bedtime for sleep 11/01/15  Yes Fayrene Helper, MD  silver sulfADIAZINE (SILVADENE) 1 % cream Apply 1 application topically daily. 01/09/16   Tanna Furry,  MD    Family History Family History  Problem Relation Age of Onset  . Hypertension Mother   . Stroke Mother   . Cancer Father     lung   . Hypertension Sister   . Depression Sister   . Alcohol abuse Sister   . Hypertension Sister   . Depression Sister   . Cancer Sister   . Diabetes Sister   . Depression Sister   . Cancer Sister     renal   . Kidney failure Brother   . Kidney failure      family history   . Hypertension      family history     Social History Social History  Substance Use Topics  . Smoking status: Current Every Day Smoker    Packs/day: 1.00    Types: Cigarettes  . Smokeless tobacco: Never Used  . Alcohol use No     Allergies   Codeine   Review of Systems Review of Systems  Constitutional: Negative for appetite change, chills, diaphoresis, fatigue and fever.  HENT: Negative for mouth sores, sore throat and trouble swallowing.   Eyes: Negative for visual disturbance.  Respiratory: Negative for cough, chest tightness, shortness of breath and wheezing.   Cardiovascular: Negative for chest pain.  Gastrointestinal: Negative for  abdominal distention, abdominal pain, diarrhea, nausea and vomiting.  Endocrine: Negative for polydipsia, polyphagia and polyuria.  Genitourinary: Negative for dysuria, frequency and hematuria.  Musculoskeletal: Negative for gait problem.  Skin: Negative for color change, pallor and rash.  Neurological: Negative for dizziness, syncope, light-headedness and headaches.  Hematological: Does not bruise/bleed easily.  Psychiatric/Behavioral: Negative for behavioral problems and confusion.     Physical Exam Updated Vital Signs BP 141/85   Pulse 82   Temp 98.7 F (37.1 C) (Oral)   Resp 20   Ht '5\' 3"'$  (1.6 m)   Wt 248 lb (112.5 kg)   SpO2 90%   BMI 43.93 kg/m   Physical Exam  Constitutional: She is oriented to person, place, and time. She appears well-developed and well-nourished. No distress.  HENT:  Head: Normocephalic.  Eyes: Conjunctivae are normal. Pupils are equal, round, and reactive to light. No scleral icterus.  Neck: Normal range of motion. Neck supple. No thyromegaly present.  Cardiovascular: Normal rate and regular rhythm.  Exam reveals no gallop and no friction rub.   No murmur heard. Pulmonary/Chest: Effort normal and breath sounds normal. No respiratory distress. She has no wheezes. She has no rales.  Abdominal: Soft. Bowel sounds are normal. She exhibits no distension. There is no tenderness. There is no rebound.  Musculoskeletal: Normal range of motion.  Neurological: She is alert and oriented to person, place, and time.  Skin: Skin is warm and dry. No rash noted.  Distal based regular flap laceration left lower leg. Some surrounding lymphedema. Some granulation in the wound. No obvious cellulitis or lymphangitic spread.  Psychiatric: She has a normal mood and affect. Her behavior is normal.     ED Treatments / Results  Labs (all labs ordered are listed, but only abnormal results are displayed) Labs Reviewed  CBC WITH DIFFERENTIAL/PLATELET - Abnormal; Notable for  the following:       Result Value   Eosinophils Absolute 1.2 (*)    All other components within normal limits  BASIC METABOLIC PANEL - Abnormal; Notable for the following:    Glucose, Bld 111 (*)    BUN 25 (*)    Creatinine, Ser 1.16 (*)  GFR calc non Af Amer 49 (*)    GFR calc Af Amer 57 (*)    All other components within normal limits    EKG  EKG Interpretation None       Radiology No results found.  Procedures Procedures (including critical care time)  Medications Ordered in ED Medications  silver sulfADIAZINE (SILVADENE) 1 % cream ( Topical Given 01/09/16 1259)  acetaminophen (TYLENOL) tablet 650 mg (650 mg Oral Given 01/09/16 1258)     Initial Impression / Assessment and Plan / ED Course  I have reviewed the triage vital signs and the nursing notes.  Pertinent labs & imaging results that were available during my care of the patient were reviewed by me and considered in my medical decision making (see chart for details).  Clinical Course    Wound is clean. Dressed with Silvadene and a compressive Ace wrap to her knee. Also nonadherent dressings placed. Patient appropriate for discharge home. Follow-up with her physician within the next week. Female 21 wound care referral. No sign of infection currently.  Final Clinical Impressions(s) / ED Diagnoses   Final diagnoses:  Laceration    New Prescriptions Discharge Medication List as of 01/09/2016  1:41 PM    START taking these medications   Details  silver sulfADIAZINE (SILVADENE) 1 % cream Apply 1 application topically daily., Starting Wed 01/09/2016, Print         Tanna Furry, MD 01/09/16 1452

## 2016-01-09 NOTE — ED Notes (Signed)
Left lower lateral leg cleaned with NS an silvadene, telfa and dry dressing applied

## 2016-01-10 ENCOUNTER — Telehealth: Payer: Self-pay | Admitting: Family Medicine

## 2016-01-10 NOTE — Telephone Encounter (Signed)
[name] is calling wanting Dr. Moshe Cipro to know she had to go to the ER yesterday due to her O2 canister fell over on her leg and cut it open, they started her on the same medication as before when she had the ulcer and was treated at the wound clinic silverdine cream, does she need a f/u here with Dr. Moshe Cipro? She is keeping it cleaned and bandaged at this time

## 2016-01-11 ENCOUNTER — Other Ambulatory Visit: Payer: Self-pay

## 2016-01-11 MED ORDER — NYSTATIN 100000 UNIT/GM EX POWD: CUTANEOUS | 0 refills | Status: DC

## 2016-01-11 MED ORDER — CLOTRIMAZOLE-BETAMETHASONE 1-0.05 % EX CREA: 1.0000 "application " | TOPICAL_CREAM | Freq: Two times a day (BID) | CUTANEOUS | 0 refills | Status: DC

## 2016-01-11 NOTE — Telephone Encounter (Signed)
Patient presented to office asking to be seen.  Given appointment for 8/15 (next available).  Patient states that incident happened a week prior to her going to the ED.

## 2016-01-14 ENCOUNTER — Telehealth (HOSPITAL_COMMUNITY): Payer: Self-pay | Admitting: *Deleted

## 2016-01-14 ENCOUNTER — Other Ambulatory Visit: Payer: Self-pay | Admitting: Family Medicine

## 2016-01-14 ENCOUNTER — Telehealth: Payer: Self-pay

## 2016-01-14 NOTE — Telephone Encounter (Signed)
Entered, pls send and let her know

## 2016-01-14 NOTE — Telephone Encounter (Signed)
Message sent to provider 

## 2016-01-14 NOTE — Telephone Encounter (Signed)
Patients requesting dukes mouthwash be sent in for thrush from using her inhalers. Please advise

## 2016-01-14 NOTE — Telephone Encounter (Signed)
Pt was given samples and verbalized understanding.

## 2016-01-14 NOTE — Telephone Encounter (Signed)
Samples provided

## 2016-01-14 NOTE — Telephone Encounter (Signed)
Pt walked into office stating she is done with her sample packs for Rexulti 0.5 mg and 1 mg. Per pt, now that she's done with her samples, what did Dr. Harrington Challenger want her to do now. RMA informed pt that the 1 mg Rexulti QD is at her pharmacy ready for her to pick up. Per pt to ask Dr. Harrington Challenger if she could give her more samples due to not having the money to pick up medication at this time. Per pt she gets paid the 3rd of each month. Pt is in the lobby waiting for samples. Pt number is [phone] and today her number is [phone].

## 2016-01-14 NOTE — Telephone Encounter (Signed)
PATIENT CAME BY REGARDING REXULTI, SHE FINISGHED 1/2 MG, FINISHED THE 1 MG. SHE WANTS TO KNOW WHERE TO GO FROM THERE.

## 2016-01-15 ENCOUNTER — Other Ambulatory Visit: Payer: Self-pay

## 2016-01-15 ENCOUNTER — Ambulatory Visit: Payer: Self-pay | Admitting: Family Medicine

## 2016-01-15 MED ORDER — FIRST-DUKES MOUTHWASH MT SUSP: OROMUCOSAL | 0 refills | Status: DC

## 2016-01-15 NOTE — Telephone Encounter (Signed)
Advised to check with the pharmacy

## 2016-01-18 ENCOUNTER — Other Ambulatory Visit: Payer: Self-pay

## 2016-01-18 DIAGNOSIS — M47899 Other spondylosis, site unspecified: Secondary | ICD-10-CM

## 2016-01-18 MED ORDER — HYDROCODONE-ACETAMINOPHEN 10-325 MG PO TABS: ORAL_TABLET | ORAL | 0 refills | Status: DC

## 2016-01-21 ENCOUNTER — Other Ambulatory Visit: Payer: Self-pay | Admitting: Family Medicine

## 2016-01-21 ENCOUNTER — Ambulatory Visit: Payer: Self-pay | Admitting: Family Medicine

## 2016-01-21 DIAGNOSIS — Z1231 Encounter for screening mammogram for malignant neoplasm of breast: Secondary | ICD-10-CM

## 2016-01-22 ENCOUNTER — Ambulatory Visit (INDEPENDENT_AMBULATORY_CARE_PROVIDER_SITE_OTHER): Payer: Medicare Other | Admitting: Family Medicine

## 2016-01-22 ENCOUNTER — Ambulatory Visit (HOSPITAL_COMMUNITY)
Admission: RE | Admit: 2016-01-22 | Discharge: 2016-01-22 | Disposition: A | Discharge status: Home / Self Care | Payer: Medicare Other | Source: Ambulatory Visit | Attending: Family Medicine | Admitting: Family Medicine

## 2016-01-22 ENCOUNTER — Encounter: Payer: Self-pay | Admitting: Family Medicine

## 2016-01-22 VITALS — BP 118/78 | HR 93 | Resp 16 | Ht 63.0 in | Wt 229.0 lb

## 2016-01-22 DIAGNOSIS — M1712 Unilateral primary osteoarthritis, left knee: Secondary | ICD-10-CM | POA: Insufficient documentation

## 2016-01-22 DIAGNOSIS — M19072 Primary osteoarthritis, left ankle and foot: Secondary | ICD-10-CM | POA: Insufficient documentation

## 2016-01-22 DIAGNOSIS — Z794 Long term (current) use of insulin: Secondary | ICD-10-CM

## 2016-01-22 DIAGNOSIS — E119 Type 2 diabetes mellitus without complications: Secondary | ICD-10-CM

## 2016-01-22 DIAGNOSIS — Z23 Encounter for immunization: Secondary | ICD-10-CM

## 2016-01-22 DIAGNOSIS — L97922 Non-pressure chronic ulcer of unspecified part of left lower leg with fat layer exposed: Secondary | ICD-10-CM | POA: Insufficient documentation

## 2016-01-22 DIAGNOSIS — I10 Essential (primary) hypertension: Secondary | ICD-10-CM | POA: Diagnosis not present

## 2016-01-22 DIAGNOSIS — S8992XA Unspecified injury of left lower leg, initial encounter: Secondary | ICD-10-CM | POA: Diagnosis not present

## 2016-01-22 DIAGNOSIS — G4719 Other hypersomnia: Secondary | ICD-10-CM | POA: Diagnosis not present

## 2016-01-22 DIAGNOSIS — IMO0001 Reserved for inherently not codable concepts without codable children: Secondary | ICD-10-CM

## 2016-01-22 DIAGNOSIS — F418 Other specified anxiety disorders: Secondary | ICD-10-CM

## 2016-01-22 DIAGNOSIS — F419 Anxiety disorder, unspecified: Secondary | ICD-10-CM

## 2016-01-22 DIAGNOSIS — F329 Major depressive disorder, single episode, unspecified: Secondary | ICD-10-CM

## 2016-01-22 MED ORDER — DOXYCYCLINE HYCLATE 100 MG PO TABS: 100.0000 mg | ORAL_TABLET | Freq: Two times a day (BID) | ORAL | 0 refills | Status: DC

## 2016-01-22 NOTE — Patient Instructions (Signed)
F/u as before, call if you need me sooner  Xray of left leg, antibiotic course prescribed and you are referred to Dr Nils Pyle for care till healed  Please work on stopping smoking   Flu vaccine today  Thankful depression is improved

## 2016-01-22 NOTE — Assessment & Plan Note (Signed)
10 day course of  xray of left leg, and refer to wound center, high risk, has PVD, diabetes, and smoker current is 1 PPD

## 2016-01-23 ENCOUNTER — Encounter: Payer: Self-pay | Admitting: Family Medicine

## 2016-01-23 NOTE — Assessment & Plan Note (Signed)
Ms. [name] is reminded of the importance of commitment to daily physical activity for 30 minutes or more, as able and the need to limit carbohydrate intake to 30 to 60 grams per meal to help with blood sugar control.   The need to take medication as prescribed, test blood sugar as directed, and to call between visits if there is a concern that blood sugar is uncontrolled is also discussed.   Ms. [name] is reminded of the importance of daily foot exam, annual eye examination, and good blood sugar, blood pressure and cholesterol control. Sub optimal control , needs to be more disciplined with diet and med adheerence  Diabetic Labs Latest Ref Rng & Units 01/09/2016 12/27/2015 06/18/2015 12/26/2014 09/05/2014  HbA1c <5.7 % - 7.9(H) 7.8(H) 7.3(H) 7.3(H)  Microalbumin Not estab mg/dL - 1.0 - 1.6 -  Micro/Creat Ratio <30 mcg/mg creat - 6 - 3.8 -  Chol 125 - 200 mg/dL - - 149 111(L) -  HDL >=46 mg/dL - - 51 45(L) -  Calc LDL <130 mg/dL - - 81 52 -  Triglycerides <150 mg/dL - - 83 72 -  Creatinine 0.44 - 1.00 mg/dL 1.16(H) 1.25(H) 1.31(H) 1.18(H) 1.12(H)   BP/Weight 01/22/2016 01/09/2016 12/28/2015 10/01/2015 06/18/2015 7/68/1157 07/08/2033  Systolic BP 597 416 384 536 468 032 122  Diastolic BP 78 85 62 92 80 70 82  Wt. (Lbs) 229 248 247 230 226 217 209  BMI 40.57 43.93 43.75 40.75 40.04 38.45 37.03  Some encounter information is confidential and restricted. Go to Review Flowsheets activity to see all data.   Foot/eye exam completion dates 06/18/2015 03/09/2014  Foot exam Order - -  Foot Form Completion Done Done

## 2016-01-23 NOTE — Assessment & Plan Note (Signed)
Controlled, no change in medication DASH diet and commitment to daily physical activity for a minimum of 30 minutes discussed and encouraged, as a part of hypertension management. The importance of attaining a healthy weight is also discussed.  BP/Weight 01/22/2016 01/09/2016 12/28/2015 10/01/2015 06/18/2015 3/88/8280 0/08/4915  Systolic BP 915 056 979 480 165 537 482  Diastolic BP 78 85 62 92 80 70 82  Wt. (Lbs) 229 248 247 230 226 217 209  BMI 40.57 43.93 43.75 40.75 40.04 38.45 37.03  Some encounter information is confidential and restricted. Go to Review Flowsheets activity to see all data.

## 2016-01-23 NOTE — Assessment & Plan Note (Signed)
Improved and managed by psych

## 2016-01-23 NOTE — Progress Notes (Signed)
[name]     MRN: [id number]      DOB: 05/05/1951   HPI [name] is here for follow up and re-evaluation of chronic medical conditions, and left leg ulcer sustained following direct trauma approx 3 weeks ago. Wound is not healing, still very open and she is cleaning and keeping area dry. Concern as she has pVD, diabetes and nicotine dependence, will need direct care from wound center. No currnet drainage  ROS Denies recent fever or chills. Denies sinus pressure, nasal congestion, ear pain or sore throat. Denies chest congestion, productive cough or wheezing. Denies chest pains, palpitations and leg swelling Denies abdominal pain, nausea, vomiting,diarrhea or constipation.   Denies dysuria, frequency, hesitancy or incontinence. Deni uncontrolled  joint pain, swelling and limitation in mobility. Denies headaches, seizures, numbness, or tingling. Denies uncontrolled  depression, anxiety or insomnia.Reports improved mood with new antidepressant PE  BP 118/78   Pulse 93   Resp 16   Ht '5\' 3"'$  (1.6 m)   Wt 229 lb (103.9 kg)   SpO2 93%   BMI 40.57 kg/m   Patient alert and oriented and in no cardiopulmonary distress.  HEENT: No facial asymmetry, EOMI,   oropharynx pink and moist.  Neck supple no JVD, no mass.  Chest: Decreased air entry bilateral crackles , no wheezes  CVS: S1, S2 no murmurs, no S3.Regular rate.  ABD: Soft non tender.   Ext: No edema  MS: decreased  ROM spine, shoulders, hips and knees.  Skin: 5 cm ulcer involving SQ tissue of left leg noted,  No drainage noted  Psych: Good eye contact, normal affect. Memory intact not anxious or depressed appearing.  CNS: CN 2-12 intact, power,  normal throughout.no focal deficits noted.   Assessment & Plan  Excessive daytime sleepiness 10 day course of  xray of left leg, and refer to wound center, high risk, has PVD, diabetes, and smoker current is 1 PPD  Essential hypertension Controlled, no change in  medication DASH diet and commitment to daily physical activity for a minimum of 30 minutes discussed and encouraged, as a part of hypertension management. The importance of attaining a healthy weight is also discussed.  BP/Weight 01/22/2016 01/09/2016 12/28/2015 10/01/2015 06/18/2015 3/47/4259 10/05/3873  Systolic BP 643 329 518 841 660 630 160  Diastolic BP 78 85 62 92 80 70 82  Wt. (Lbs) 229 248 247 230 226 217 209  BMI 40.57 43.93 43.75 40.75 40.04 38.45 37.03  Some encounter information is confidential and restricted. Go to Review Flowsheets activity to see all data.       Anxiety and depression Improved and managed by psych  Diabetes mellitus, insulin dependent (IDDM), controlled [name] is reminded of the importance of commitment to daily physical activity for 30 minutes or more, as able and the need to limit carbohydrate intake to 30 to 60 grams per meal to help with blood sugar control.   The need to take medication as prescribed, test blood sugar as directed, and to call between visits if there is a concern that blood sugar is uncontrolled is also discussed.   [name] is reminded of the importance of daily foot exam, annual eye examination, and good blood sugar, blood pressure and cholesterol control. Sub optimal control , needs to be more disciplined with diet and med adheerence  Diabetic Labs Latest Ref Rng & Units 01/09/2016 12/27/2015 06/18/2015 12/26/2014 09/05/2014  HbA1c <5.7 % - 7.9(H) 7.8(H) 7.3(H) 7.3(H)  Microalbumin Not estab mg/dL -  1.0 - 1.6 -  Micro/Creat Ratio <30 mcg/mg creat - 6 - 3.8 -  Chol 125 - 200 mg/dL - - 149 111(L) -  HDL >=46 mg/dL - - 51 45(L) -  Calc LDL <130 mg/dL - - 81 52 -  Triglycerides <150 mg/dL - - 83 72 -  Creatinine 0.44 - 1.00 mg/dL 1.16(H) 1.25(H) 1.31(H) 1.18(H) 1.12(H)   BP/Weight 01/22/2016 01/09/2016 12/28/2015 10/01/2015 06/18/2015 12/19/9196 0/07/2177  Systolic BP 810 254 862 824 175 301 040  Diastolic BP 78 85 62 92 80 70 82  Wt. (Lbs)  229 248 247 230 226 217 209  BMI 40.57 43.93 43.75 40.75 40.04 38.45 37.03  Some encounter information is confidential and restricted. Go to Review Flowsheets activity to see all data.   Foot/eye exam completion dates 06/18/2015 03/09/2014  Foot exam Order - -  Foot Form Completion Done Done

## 2016-01-24 DIAGNOSIS — E669 Obesity, unspecified: Secondary | ICD-10-CM | POA: Diagnosis not present

## 2016-01-24 DIAGNOSIS — J449 Chronic obstructive pulmonary disease, unspecified: Secondary | ICD-10-CM | POA: Diagnosis not present

## 2016-01-24 DIAGNOSIS — J9611 Chronic respiratory failure with hypoxia: Secondary | ICD-10-CM | POA: Diagnosis not present

## 2016-01-24 DIAGNOSIS — I10 Essential (primary) hypertension: Secondary | ICD-10-CM | POA: Diagnosis not present

## 2016-01-30 DIAGNOSIS — S8012XA Contusion of left lower leg, initial encounter: Secondary | ICD-10-CM | POA: Diagnosis not present

## 2016-01-30 DIAGNOSIS — I10 Essential (primary) hypertension: Secondary | ICD-10-CM | POA: Diagnosis not present

## 2016-01-30 DIAGNOSIS — Z79899 Other long term (current) drug therapy: Secondary | ICD-10-CM | POA: Diagnosis not present

## 2016-01-30 DIAGNOSIS — K219 Gastro-esophageal reflux disease without esophagitis: Secondary | ICD-10-CM | POA: Diagnosis not present

## 2016-01-30 DIAGNOSIS — Z7951 Long term (current) use of inhaled steroids: Secondary | ICD-10-CM | POA: Diagnosis not present

## 2016-01-30 DIAGNOSIS — F418 Other specified anxiety disorders: Secondary | ICD-10-CM | POA: Diagnosis not present

## 2016-01-30 DIAGNOSIS — Z66 Do not resuscitate: Secondary | ICD-10-CM | POA: Diagnosis not present

## 2016-01-30 DIAGNOSIS — Z794 Long term (current) use of insulin: Secondary | ICD-10-CM | POA: Diagnosis not present

## 2016-01-30 DIAGNOSIS — E785 Hyperlipidemia, unspecified: Secondary | ICD-10-CM | POA: Diagnosis not present

## 2016-01-30 DIAGNOSIS — I739 Peripheral vascular disease, unspecified: Secondary | ICD-10-CM | POA: Diagnosis not present

## 2016-01-30 DIAGNOSIS — Z9981 Dependence on supplemental oxygen: Secondary | ICD-10-CM | POA: Diagnosis not present

## 2016-01-30 DIAGNOSIS — Z7982 Long term (current) use of aspirin: Secondary | ICD-10-CM | POA: Diagnosis not present

## 2016-01-30 DIAGNOSIS — E0590 Thyrotoxicosis, unspecified without thyrotoxic crisis or storm: Secondary | ICD-10-CM | POA: Diagnosis not present

## 2016-01-30 DIAGNOSIS — J449 Chronic obstructive pulmonary disease, unspecified: Secondary | ICD-10-CM | POA: Diagnosis not present

## 2016-01-30 DIAGNOSIS — E119 Type 2 diabetes mellitus without complications: Secondary | ICD-10-CM | POA: Diagnosis not present

## 2016-02-06 DIAGNOSIS — E119 Type 2 diabetes mellitus without complications: Secondary | ICD-10-CM | POA: Diagnosis not present

## 2016-02-06 DIAGNOSIS — I872 Venous insufficiency (chronic) (peripheral): Secondary | ICD-10-CM | POA: Diagnosis not present

## 2016-02-06 DIAGNOSIS — L97829 Non-pressure chronic ulcer of other part of left lower leg with unspecified severity: Secondary | ICD-10-CM | POA: Diagnosis not present

## 2016-02-11 ENCOUNTER — Telehealth (HOSPITAL_COMMUNITY): Payer: Self-pay | Admitting: *Deleted

## 2016-02-11 ENCOUNTER — Ambulatory Visit (HOSPITAL_COMMUNITY): Payer: Self-pay | Admitting: Psychiatry

## 2016-02-11 NOTE — Telephone Encounter (Signed)
Spoke with pt and she stated that pharmacy stated insurance is 80% and pt have to pt the rest.

## 2016-02-11 NOTE — Telephone Encounter (Signed)
Voice message from patient.  She said the Rexulti cost $1400 and said she would have to pay $342 a month.   She is out of samples and wants to know what to do.   She would like more samples.

## 2016-02-11 NOTE — Telephone Encounter (Signed)
Spoke with pt and she stated she have [employer] and provided staff with member number and another number that stated member ID number. Pt provided staff with member of [id number] and member ID number [id number]. Pt was also given Rexulti patient assistance number to see if she could call them and ask them if she could qualify for assistance due to income because pt have Medicare Part D. Pt verbalized understanding and stated she will call them.

## 2016-02-11 NOTE — Telephone Encounter (Signed)
Called pt and lmtcb on [phone]. Office number provided.

## 2016-02-11 NOTE — Telephone Encounter (Signed)
Per pt, she would like to have samples while she wait for office to see if she need Prior Auth for her Rexulti 1 mg. Pt number is [phone]

## 2016-02-11 NOTE — Telephone Encounter (Signed)
Message sent to [facility] to start Prior Auth for pt Rexulti 1 mg.

## 2016-02-12 ENCOUNTER — Telehealth (HOSPITAL_COMMUNITY): Payer: Self-pay | Admitting: *Deleted

## 2016-02-12 ENCOUNTER — Encounter (HOSPITAL_COMMUNITY): Payer: Self-pay | Admitting: *Deleted

## 2016-02-12 NOTE — Telephone Encounter (Signed)
Called patients insurance carrier to ask about Rexulti needing possible prior authorization since medication is $342 per month. Called [phone] spoke with Marzetta Board who states that it does not require authorization but that price is the patients co-pay and nothing can be done to decrease the amount.

## 2016-02-12 NOTE — Telephone Encounter (Signed)
Pt walked into office to pick up her samples for Rexulti 1 mg. 0.'5mg'$ /'1mg'$  was given to pt and pt verbalized understanding.

## 2016-02-12 NOTE — Progress Notes (Signed)
Pt came into office to pick up her samples for Rexulti '1mg'$  that she requested per previous phone call. Due to office not having 1 mg samples, provider is giving pt 0.5 mg and 1 mg sample pack. Pt was given 3 packs. Two of the 3 packs LOT number is [id number] with expiration date of 05-2017. The last box LOT number is [id number]. Asked pt for D/L and she stated that she did not bring it with her. Informed pt that next pick up she would have to bring it with her. Pt verbalized understanding.

## 2016-02-12 NOTE — Telephone Encounter (Signed)
[name] Called patients insurance carrier to ask about Rexulti needing possible prior authorization since medication is $342 per month. Called [phone] spoke with Marzetta Board who states that it does not require authorization but that price is the patients co-pay and nothing can be done to decrease the amount.

## 2016-02-12 NOTE — Telephone Encounter (Signed)
She can have samples

## 2016-02-13 NOTE — Telephone Encounter (Signed)
You can see if she qualifies for assistance from the drug company

## 2016-02-13 NOTE — Telephone Encounter (Signed)
When pt came into office, she informed RMA that she called the pt assistance for Rexulti and they informed her they will send her forms to fill out. Per pt they informed her to only fill out the pt section and give the form to provider office to fill out provider information and fax form to them. RMA verbalized understanding and informed pt to bring form to office when she gets it with her portion filled out and form will be faxed to [facility]. Pt verbalized understanding

## 2016-02-15 ENCOUNTER — Other Ambulatory Visit: Payer: Self-pay

## 2016-02-15 DIAGNOSIS — M47899 Other spondylosis, site unspecified: Secondary | ICD-10-CM

## 2016-02-15 MED ORDER — HYDROCODONE-ACETAMINOPHEN 10-325 MG PO TABS: ORAL_TABLET | ORAL | 0 refills | Status: DC

## 2016-02-18 ENCOUNTER — Ambulatory Visit (HOSPITAL_COMMUNITY): Payer: Self-pay | Admitting: Psychology

## 2016-02-26 DIAGNOSIS — I872 Venous insufficiency (chronic) (peripheral): Secondary | ICD-10-CM | POA: Diagnosis not present

## 2016-02-26 DIAGNOSIS — L97822 Non-pressure chronic ulcer of other part of left lower leg with fat layer exposed: Secondary | ICD-10-CM | POA: Diagnosis not present

## 2016-02-26 DIAGNOSIS — E119 Type 2 diabetes mellitus without complications: Secondary | ICD-10-CM | POA: Diagnosis not present

## 2016-02-26 DIAGNOSIS — L97829 Non-pressure chronic ulcer of other part of left lower leg with unspecified severity: Secondary | ICD-10-CM | POA: Diagnosis not present

## 2016-03-02 ENCOUNTER — Other Ambulatory Visit: Payer: Self-pay | Admitting: Family Medicine

## 2016-03-10 ENCOUNTER — Ambulatory Visit (INDEPENDENT_AMBULATORY_CARE_PROVIDER_SITE_OTHER): Payer: Medicare Other | Admitting: Psychiatry

## 2016-03-10 ENCOUNTER — Ambulatory Visit: Payer: Self-pay

## 2016-03-10 ENCOUNTER — Encounter (HOSPITAL_COMMUNITY): Payer: Self-pay | Admitting: Psychiatry

## 2016-03-10 VITALS — BP 101/70 | HR 93 | Ht 63.0 in | Wt 235.0 lb

## 2016-03-10 DIAGNOSIS — Z79899 Other long term (current) drug therapy: Secondary | ICD-10-CM | POA: Diagnosis not present

## 2016-03-10 DIAGNOSIS — F333 Major depressive disorder, recurrent, severe with psychotic symptoms: Secondary | ICD-10-CM

## 2016-03-10 DIAGNOSIS — Z791 Long term (current) use of non-steroidal anti-inflammatories (NSAID): Secondary | ICD-10-CM

## 2016-03-10 DIAGNOSIS — Z809 Family history of malignant neoplasm, unspecified: Secondary | ICD-10-CM

## 2016-03-10 DIAGNOSIS — Z841 Family history of disorders of kidney and ureter: Secondary | ICD-10-CM

## 2016-03-10 DIAGNOSIS — Z8249 Family history of ischemic heart disease and other diseases of the circulatory system: Secondary | ICD-10-CM

## 2016-03-10 DIAGNOSIS — Z818 Family history of other mental and behavioral disorders: Secondary | ICD-10-CM

## 2016-03-10 DIAGNOSIS — Z823 Family history of stroke: Secondary | ICD-10-CM

## 2016-03-10 MED ORDER — FLUOXETINE HCL 20 MG PO CAPS: ORAL_CAPSULE | ORAL | 2 refills | Status: DC

## 2016-03-10 MED ORDER — ZOLPIDEM TARTRATE 10 MG PO TABS: 10.0000 mg | ORAL_TABLET | Freq: Every day | ORAL | 2 refills | Status: DC

## 2016-03-10 MED ORDER — ALPRAZOLAM 1 MG PO TABS: ORAL_TABLET | ORAL | 2 refills | Status: DC

## 2016-03-10 NOTE — Progress Notes (Signed)
Patient ID: [name], female   DOB: Nov 01, 1950, 65 y.o.   MRN: [id number] Patient ID: [name], female   DOB: [birth year], 65 y.o.   MRN: [id number] Patient ID: [name], female   DOB: 01-28-1951, 65 y.o.   MRN: [id number] Patient ID: [name], female   DOB: [birth year], 65 y.o.   MRN: [id number] Patient ID: [name], female   DOB: 11/18/1950, 65 y.o.   MRN: [id number] Patient ID: [name], female   DOB: 06-26-1950, 65 y.o.   MRN: [id number] Patient ID: [name], female   DOB: 10/07/1950, 65 y.o.   MRN: [id number]  Psychiatric Assessment Adult  Patient Identification:  [name] Date of Evaluation:  03/10/2016 Chief Complaint: depression History of Chief Complaint:   Chief Complaint  Patient presents with  . Depression  . Anxiety  . Follow-up    Depression         Associated symptoms include fatigue and appetite change.  Past medical history includes anxiety.   Anxiety  Symptoms include nervous/anxious behavior and shortness of breath.    this patient is a 65 year old widowed black female who lives alone in [place]. She has no children. She is on disability.  The patient was referred by Dr. Moshe Cipro her primary care physician and Dr. Jefm Miles her psychologist for further evaluation and treatment of major depression and anxiety.  The patient states that she's been depressed since her teen years. She's not really sure why. When she was in college she went through severe depression and had to quit. She's had this on and off through the years but really not receive treatment until a couple years ago after her husband died. Her primary care physician has put her on Prozac and Xanax which have helped to some degree.however recently she has been getting more depressed. She has severe COPD and is on oxygen. Yet, she continues to smoke THREE PACKS PER DAY which she claims is secondary to her depressed mood.  Recently she's gone through more  deaths including her sister in 2014 a niece in 2014. Another sister was recently diagnosed with cancer. The patient has gotten overwhelmed by all this and claims it's getting hard for her to just get out of bed and function. She has no energy, she's been shaky and having tremors in her hands, she complains of short-term memory loss.her sleep is variable and Ambien sometimes helps. Her appetite is up and down. Sometimes she hears the voices of her deceased relatives or "feels her spirits." She's never been to a psychiatric hospitalization  But has been seeing Dr. Jefm Miles in our office for several years. She has never been suicidal and states she is very "spiritual and values life. She stays away from people but she has a god niece and  God sister who check on her faithfully.she denies any other psychotic symptoms and does not use drugs or alcohol  The patient returns after 3 months. She states that she has been doing pretty well lately emotionally. She states that the Rexulti 1 mg has helped her mood considerably. The Xanax continues to help her anxiety and she is sleeping well with the Ambien. She continues on Prozac for depression. She still doesn't listen to me much or her primary physician regarding stopping her smoking or getting her blood sugar under control. She drinks about H sugary sodas a day. She does not like water. She claims that it doesn't matter cut she has signed a DO  NOT RESUSCITATE order and though she is not overtly suicidal she states that she will go in the time is ready. Review of Systems  Constitutional: Positive for activity change, appetite change and fatigue.  HENT: Negative.   Eyes: Negative.   Respiratory: Positive for shortness of breath.   Cardiovascular: Negative.   Gastrointestinal: Negative.   Endocrine: Negative.   Genitourinary: Negative.   Musculoskeletal: Positive for arthralgias.  Skin: Negative.   Allergic/Immunologic: Negative.   Neurological: Positive for  tremors.  Hematological: Negative.   Psychiatric/Behavioral: Positive for depression, dysphoric mood, hallucinations and sleep disturbance. The patient is nervous/anxious.    Physical Examnot done  Depressive Symptoms: depressed mood, anhedonia, psychomotor retardation, difficulty concentrating, impaired memory, anxiety, disturbed sleep, decreased appetite,  (Hypo) Manic Symptoms:   Elevated Mood:  No Irritable Mood:  No Grandiosity:  No Distractibility:  No Labiality of Mood:  No Delusions:  No Hallucinations:  Yes Impulsivity:  Yes Sexually Inappropriate Behavior:  No Financial Extravagance:  No Flight of Ideas:  No  Anxiety Symptoms: Excessive Worry:  Yes Panic Symptoms:  No Agoraphobia:  No Obsessive Compulsive: No  Symptoms: None, Specific Phobias:  No Social Anxiety:  No  Psychotic Symptoms:  Hallucinations: Yes Auditory Delusions:  No Paranoia:  No   Ideas of Reference:  No  PTSD Symptoms: Ever had a traumatic exposure:  No Had a traumatic exposure in the last month:  No Re-experiencing: No None Hypervigilance:  No Hyperarousal: No None Avoidance: No None  Traumatic Brain Injury: No   Past Psychiatric History: Diagnosis:Maj. depression  Hospitalizations: none  Outpatient Care: has been seeing Dr. Jefm Miles in our office for counseling  Substance Abuse Care: none  Self-Mutilation: none  Suicidal Attempts:none  Violent Behaviors: none   Past Medical History:   Past Medical History:  Diagnosis Date  . Anxiety   . COPD (chronic obstructive pulmonary disease) ([place])   . Depression 2008  . Diabetes mellitus, type 1 1983   type 2 diabetes  . DJD (degenerative joint disease)    of the spine   . GERD (gastroesophageal reflux disease)   . Hyperlipidemia 2007  . Hypertension 1983  . Hypothyroidism 2004   left thyroidectomy  . Insomnia   . Obesity    History of Loss of Consciousness:  No Seizure History:  No Cardiac History:  No Allergies:    Allergies  Allergen Reactions  . Codeine     REACTION: nausea   Current Medications:  Current Outpatient Prescriptions  Medication Sig Dispense Refill  . acetaminophen (TYLENOL) 325 MG tablet Take 650 mg by mouth every 6 (six) hours as needed.    . ALPRAZolam (XANAX) 1 MG tablet TAKE ONE TABLET FOUR TIMES A DAY 120 tablet 2  . amLODipine (NORVASC) 5 MG tablet Take 1 tablet (5 mg total) by mouth daily. 30 tablet 5  . aspirin (ASPIRIN LOW DOSE) 81 MG EC tablet Take 81 mg by mouth daily.      . B-D INS SYR ULTRAFINE 1CC/31G 31G X 5/16" 1 ML MISC use as directed FOR ONCE DAILY TESTING 100 each 5  . Brexpiprazole (REXULTI) 1 MG TABS Take 1 mg by mouth at bedtime. 30 tablet 2  . calcium carbonate (TUMS) 500 MG chewable tablet Chew 1 tablet by mouth as needed.      . cloNIDine (CATAPRES) 0.2 MG tablet Take 1 tablet (0.2 mg total) by mouth at bedtime. 30 tablet 4  . clotrimazole-betamethasone (LOTRISONE) cream Apply 1 application topically 2 (two) times daily.  30 g 0  . cyclobenzaprine (FLEXERIL) 10 MG tablet Take 1 tablet (10 mg total) by mouth at bedtime. 30 tablet 5  . Diphenhyd-Hydrocort-Nystatin (FIRST-DUKES MOUTHWASH) SUSP 10 cc three times daily, gargle, swish and swallow, for 10 days 300 mL 0  . esomeprazole (NEXIUM) 40 MG capsule take 1 capsule by mouth once daily BEFORE BREAKFAST 30 capsule 4  . FLUoxetine (PROZAC) 20 MG capsule Take two in the am and one in the evening 90 capsule 2  . Fluticasone-Salmeterol (ADVAIR DISKUS) 250-50 MCG/DOSE AEPB inhale 1 dose by mouth twice a day 60 each 4  . Foot Care Products (DIABETIC INSOLES) MISC by Does not apply route. And Shoes    . furosemide (LASIX) 40 MG tablet Take 1 tablet (40 mg total) by mouth daily. 30 tablet 4  . glipiZIDE (GLIPIZIDE XL) 2.5 MG 24 hr tablet Take 1 tablet (2.5 mg total) by mouth daily. 30 tablet 4  . Glucose Blood (BAYER BREEZE 2 TEST) DISK by In Vitro route. Lancets and strips for four times a day testing     .  guaiFENesin 100 MG/5ML SOLN Take 100 mg by mouth 4 (four) times daily. Reported on 06/18/2015    . HYDROcodone-acetaminophen (NORCO) 10-325 MG tablet One tablet once daily 30 tablet 0  . ipratropium-albuterol (DUONEB) 0.5-2.5 (3) MG/3ML SOLN Take 3 mLs by nebulization 4 (four) times daily as needed.     Marland Kitchen LANTUS 100 UNIT/ML injection INJECT 50 UNITS SUBCUTANEOUSLY ONCE DAILY 30 vial 3  . levalbuterol (XOPENEX HFA) 45 MCG/ACT inhaler Inhale 1-2 puffs into the lungs as needed.      Marland Kitchen levothyroxine (SYNTHROID, LEVOTHROID) 100 MCG tablet Take 1 tablet (100 mcg total) by mouth daily. 30 tablet 4  . lisinopril-hydrochlorothiazide (PRINZIDE,ZESTORETIC) 20-12.5 MG tablet Take 1 tablet by mouth daily. 30 tablet 4  . meloxicam (MOBIC) 7.5 MG tablet Take 1 tablet (7.5 mg total) by mouth daily. 30 tablet 4  . montelukast (SINGULAIR) 10 MG tablet Take 1 tablet (10 mg total) by mouth at bedtime. 30 tablet 4  . nystatin [facility]) powder apply to affected area twice a day if needed 60 g 0  . potassium chloride 20 MEQ/15ML (10%) solution TAKE 1 AND 1/2 TEASPOONFUL BY MOUTH ONCE DAILY 240 mL 2  . rosuvastatin (CRESTOR) 20 MG tablet Take 1 tablet (20 mg total) by mouth daily. 30 tablet 4  . silver sulfADIAZINE (SILVADENE) 1 % cream Apply 1 application topically daily. (Patient not taking: Reported on 03/10/2016) 50 g 0  . Vitamin D, Ergocalciferol, (DRISDOL) 50000 units CAPS capsule Take 1 capsule (50,000 Units total) by mouth every 7 (seven) days. 4 capsule 11  . zolpidem (AMBIEN) 10 MG tablet Take 1 tablet (10 mg total) by mouth at bedtime. 30 tablet 2   No current facility-administered medications for this visit.     Previous Psychotropic Medications:  Medication Dose   Wellbutrin-caused irritability                       Substance Abuse History in the last 12 months: Substance Age of 1st Use Last Use Amount Specific Type  Nicotine   Smokes 3 packs of cigarettes per day   Alcohol      Cannabis       Opiates      Cocaine      Methamphetamines      LSD      Ecstasy      Benzodiazepines  Caffeine      Inhalants      Others:                          Medical Consequences of Substance Abuse: smoking is making her COPD much worse  Legal Consequences of Substance Abuse: none  Family Consequences of Substance Abuse: none  Blackouts:  No DT's:  No Withdrawal Symptoms:  No None  Social History: Current Place of Residence: [place] of Birth: [place] Family Members: 2 sisters Marital Status:  Widowed Children: none   Relationships:  Education:  Dentist Problems/Performance:  Religious Beliefs/Practices: Christian History of Abuse: none Energy manager History:  None. Legal History: none Hobbies/Interests: listening to gospel music  Family History:   Family History  Problem Relation Age of Onset  . Hypertension Mother   . Stroke Mother   . Cancer Father     lung   . Hypertension Sister   . Depression Sister   . Alcohol abuse Sister   . Hypertension Sister   . Depression Sister   . Cancer Sister   . Diabetes Sister   . Depression Sister   . Cancer Sister     renal   . Kidney failure Brother   . Kidney failure      family history   . Hypertension      family history     Mental Status Examination/Evaluation: Objective:  Appearance:  using portable oxygen,Walking very slowly,Dirty and disheveled   Eye Contact::  Good  Speech:  Clear and Coherent  Volume:  Decreased  Mood:good  Affect:  Bright  Thought Process:  Circumstantial  Orientation:  Full (Time, Place, and Person)  Thought Content:  Rumination   Suicidal Thoughts:  No  Homicidal Thoughts:  No  Judgement:  Fair  Insight:  Fair  Psychomotor Activity:  Decreased  Akathisia:  No  Handed:  Right  AIMS (if indicated):    Assets:  Communication Skills Desire for Improvement Social Support    Laboratory/X-Ray  Psychological Evaluation(s)   reviewed in chart     Assessment:  Axis I: Major Depression, Recurrent severe  AXIS I Major Depression, Recurrent severe  AXIS II Deferred  AXIS III Past Medical History:  Diagnosis Date  . Anxiety   . COPD (chronic obstructive pulmonary disease) ([place])   . Depression 2008  . Diabetes mellitus, type 1 1983   type 2 diabetes  . DJD (degenerative joint disease)    of the spine   . GERD (gastroesophageal reflux disease)   . Hyperlipidemia 2007  . Hypertension 1983  . Hypothyroidism 2004   left thyroidectomy  . Insomnia   . Obesity      AXIS IV problems related to social environment  AXIS V 41-50 serious symptoms   Treatment Plan/Recommendations:  Plan of Care: medication management  Laboratory:   Psychotherapy: she is seeing Dr. Jefm Miles here  Medications: she'll continue Prozac 60 mg daily for depression and Xanax 1 mg 4 times a day for anxiety.She will Continue rexulti 1 mg daily. She'll continue Ambien 10 mg at bedtime for sleep   Routine PRN Medications:  No  Consultations:   Safety Concerns:  She denies thoughts or plans of self-harm  Other:she'll return in 3 months     Yocheved Depner, Neoma Laming, MD 10/9/20171:27 PM    Patient ID: Maris Berger, female   DOB: 09/26/1950, 65 y.o.   MRN: [id number]

## 2016-03-24 ENCOUNTER — Ambulatory Visit (HOSPITAL_COMMUNITY): Payer: Medicare Other | Admitting: Psychology

## 2016-03-26 DIAGNOSIS — E785 Hyperlipidemia, unspecified: Secondary | ICD-10-CM | POA: Diagnosis not present

## 2016-03-26 DIAGNOSIS — E119 Type 2 diabetes mellitus without complications: Secondary | ICD-10-CM | POA: Diagnosis not present

## 2016-03-26 DIAGNOSIS — I10 Essential (primary) hypertension: Secondary | ICD-10-CM | POA: Diagnosis not present

## 2016-03-26 DIAGNOSIS — Z794 Long term (current) use of insulin: Secondary | ICD-10-CM | POA: Diagnosis not present

## 2016-03-26 LAB — CBC
HCT: 40.1 % (ref 35.0–45.0)
HEMOGLOBIN: 12.6 g/dL (ref 11.7–15.5)
MCH: 30.4 pg (ref 27.0–33.0)
MCHC: 31.4 g/dL — ABNORMAL LOW (ref 32.0–36.0)
MCV: 96.6 fL (ref 80.0–100.0)
MPV: 9.1 fL (ref 7.5–12.5)
Platelets: 283 10*3/uL (ref 140–400)
RBC: 4.15 MIL/uL (ref 3.80–5.10)
RDW: 13.5 % (ref 11.0–15.0)
WBC: 11.2 10*3/uL — ABNORMAL HIGH (ref 3.8–10.8)

## 2016-03-27 LAB — TSH: TSH: 2.62 m[IU]/L

## 2016-03-27 LAB — LIPID PANEL
CHOLESTEROL: 134 mg/dL (ref 125–200)
HDL: 56 mg/dL (ref >=46)
LDL Cholesterol: 60 mg/dL (ref <130)
Total CHOL/HDL Ratio: 2.4 Ratio (ref <=5.0)
Triglycerides: 90 mg/dL (ref <150)
VLDL: 18 mg/dL (ref <30)

## 2016-03-27 LAB — COMPLETE METABOLIC PANEL WITH GFR
ALBUMIN: 3.5 g/dL — AB (ref 3.6–5.1)
ALT: 8 U/L (ref 6–29)
AST: 15 U/L (ref 10–35)
Alkaline Phosphatase: 89 U/L (ref 33–130)
BUN: 20 mg/dL (ref 7–25)
CALCIUM: 9 mg/dL (ref 8.6–10.4)
CO2: 29 mmol/L (ref 20–31)
CREATININE: 1.25 mg/dL — AB (ref 0.50–0.99)
Chloride: 103 mmol/L (ref 98–110)
GFR, Est African American: 53 mL/min — ABNORMAL LOW (ref >=60)
GFR, Est Non African American: 46 mL/min — ABNORMAL LOW (ref >=60)
Glucose, Bld: 196 mg/dL — ABNORMAL HIGH (ref 65–99)
Potassium: 4.3 mmol/L (ref 3.5–5.3)
Sodium: 140 mmol/L (ref 135–146)
Total Bilirubin: 0.3 mg/dL (ref 0.2–1.2)
Total Protein: 6.4 g/dL (ref 6.1–8.1)

## 2016-03-27 LAB — HEMOGLOBIN A1C
Hgb A1c MFr Bld: 8.5 % — ABNORMAL HIGH (ref <5.7)
Mean Plasma Glucose: 197 mg/dL

## 2016-03-31 ENCOUNTER — Ambulatory Visit: Payer: Self-pay | Admitting: Family Medicine

## 2016-03-31 ENCOUNTER — Other Ambulatory Visit: Payer: Self-pay

## 2016-03-31 MED ORDER — HYDROCODONE-ACETAMINOPHEN 10-325 MG PO TABS: ORAL_TABLET | ORAL | 0 refills | Status: DC

## 2016-04-02 NOTE — Progress Notes (Signed)
PROGRESS NOTE  Patient:  [name]   DOB: 1950-06-05  MR Number: [id number]  Location: [facility] [address] [place] [address] Dept: [phone]  Start: 9 AM End: 10 AM  Provider/Observer:     Edgardo Roys PSYD  Chief Complaint:      Chief Complaint  Patient presents with  . Anxiety  . Depression  . Stress    Reason For Service:     The patient was referred for psychotherapeutic interventions because of ongoing symptoms of depression, anxiety, and mood swings including anger outbursts particularly of a verbal nature.  Interventions Strategy:  Cognitive/behavioral psychotherapeutic interventions  Participation Level:   Active  Participation Quality:  Appropriate      Behavioral Observation:  Well Groomed, Alert, and Appropriate.   Current Psychosocial Factors: The patient reports that her sister has been told she may have a Metastasis of her lung cancer to her liver. The patient reports that her sister has coped with it well and the patient has been trying to just be supportive of her rather than trying to control the situation. She reports that HER-2 nieces told her information this time which was a change from past interactions which was quite helpful. her Content of Session:   Review current symptoms and continued work on therapeutic interventions for issues of recurrent depression and panic attacks.  Current Status:   The patient reports that she has been actively working on some coping strategies we have been implementing particularly around coping with the scenario with her sister. She reports this is also made her really think about her own mortality and she reports that she feels like she is coming to grips with the fact that her pulmonary function will eventually take her life and that her lung capacity is now 50% but that it is not something that is  stressing her on a day-to-day basis. She reports that with this work that she has had less frequent panic attacks but does report that if her blood oxygen levels get really low that she is much more likely to have a panic attack.  Patient Progress:   The patient reports that she had started doing much worse.  She felt it was related to side effects of Rexulti and she stopped that medication on her own.  Target Goals:   Target goals have to do with reducing the intensity, duration, and frequency of depressive events as well as reducing the intensity, duration, and frequency of panic attacks. Target goals include building coping skills and strategies around both of these diagnostic issues.  Last Reviewed:   11/19/2015  Goals Addressed Today:    Specific goals addressed today had to do with managing and coping with her cognitive and behavioral responses to stressors particularly around issues that may lead to full-blown panic attacks when she is in medical situations. These have been particular triggers for her in the past for panic attacks due to her medical illnesses and past medical treatments.  Impression/Diagnosis:   The patient has had a lot of significant medical issues to deal with particularly regarding pulmonary function. However, she also has a long history of depression and panic attacks have become exacerbated due to to persistent and serious medical issues.  Diagnosis:    Axis I:  1. Severe recurrent major depressive disorder with psychotic features ([place])    2. Panic disorder with agoraphobia and moderate panic attacks  Axis II: No diagnosis     Rasmus Preusser R, PsyD 04/02/2016

## 2016-04-04 ENCOUNTER — Other Ambulatory Visit: Payer: Self-pay | Admitting: Family Medicine

## 2016-04-04 ENCOUNTER — Other Ambulatory Visit (HOSPITAL_COMMUNITY): Payer: Self-pay | Admitting: Psychiatry

## 2016-04-04 DIAGNOSIS — M62830 Muscle spasm of back: Secondary | ICD-10-CM

## 2016-05-01 ENCOUNTER — Other Ambulatory Visit: Payer: Self-pay | Admitting: Family Medicine

## 2016-05-01 ENCOUNTER — Other Ambulatory Visit: Payer: Self-pay

## 2016-05-01 DIAGNOSIS — I10 Essential (primary) hypertension: Secondary | ICD-10-CM

## 2016-05-01 MED ORDER — HYDROCODONE-ACETAMINOPHEN 10-325 MG PO TABS: ORAL_TABLET | ORAL | 0 refills | Status: DC

## 2016-05-05 ENCOUNTER — Telehealth (HOSPITAL_COMMUNITY): Payer: Self-pay | Admitting: *Deleted

## 2016-05-05 NOTE — Telephone Encounter (Signed)
Samples provided

## 2016-05-05 NOTE — Telephone Encounter (Signed)
Called pt and informed her that her Rexulti samples are ready for pick up.

## 2016-05-05 NOTE — Telephone Encounter (Signed)
Per pt she will pick them up tomorrow when she comes in for an appt with her other provider.

## 2016-05-05 NOTE — Telephone Encounter (Signed)
Pt called stating she is out of refills of her Rexulti and would like more Samples. Per pt Dr. Harrington Challenger is providing her with samples due to not being able to pay the co-pay on medication. Pt sister number is [phone].

## 2016-05-06 ENCOUNTER — Ambulatory Visit: Payer: Self-pay | Admitting: Family Medicine

## 2016-05-06 ENCOUNTER — Ambulatory Visit (HOSPITAL_COMMUNITY): Payer: Medicare Other | Admitting: Psychology

## 2016-05-06 ENCOUNTER — Encounter (HOSPITAL_COMMUNITY): Payer: Self-pay | Admitting: *Deleted

## 2016-05-06 NOTE — Progress Notes (Signed)
Pt came into office pt pick up her samples that was requested during previous phone calls. Pt D/L number is [id number] with expiration date of 04-23-20. Pt samples she is picking up is Rexulti '1mg'$ /'2mg'$  LOT number [id number] EXP 07/2017 one pack, 0.'5mg'$ /'1mg'$  LOT number [id number] EXP 11/2016 one pack and LOT number [id number] EXP 05/2017 WITH 2 PACKS. Altogether pt received 5 packs of Rexulti.

## 2016-05-12 DIAGNOSIS — R69 Illness, unspecified: Secondary | ICD-10-CM | POA: Diagnosis not present

## 2016-05-13 ENCOUNTER — Encounter: Payer: Self-pay | Admitting: Family Medicine

## 2016-05-13 ENCOUNTER — Ambulatory Visit: Payer: Self-pay | Admitting: Family Medicine

## 2016-05-18 ENCOUNTER — Other Ambulatory Visit: Payer: Self-pay | Admitting: Family Medicine

## 2016-05-28 ENCOUNTER — Ambulatory Visit (INDEPENDENT_AMBULATORY_CARE_PROVIDER_SITE_OTHER): Payer: Medicare Other | Admitting: Family Medicine

## 2016-05-28 ENCOUNTER — Encounter: Payer: Self-pay | Admitting: Family Medicine

## 2016-05-28 VITALS — BP 138/82 | HR 109 | Resp 16 | Ht 63.0 in | Wt 252.0 lb

## 2016-05-28 DIAGNOSIS — I10 Essential (primary) hypertension: Secondary | ICD-10-CM

## 2016-05-28 DIAGNOSIS — Z794 Long term (current) use of insulin: Secondary | ICD-10-CM

## 2016-05-28 DIAGNOSIS — IMO0001 Reserved for inherently not codable concepts without codable children: Secondary | ICD-10-CM

## 2016-05-28 DIAGNOSIS — W19XXXA Unspecified fall, initial encounter: Secondary | ICD-10-CM

## 2016-05-28 DIAGNOSIS — E038 Other specified hypothyroidism: Secondary | ICD-10-CM | POA: Diagnosis not present

## 2016-05-28 DIAGNOSIS — E119 Type 2 diabetes mellitus without complications: Secondary | ICD-10-CM

## 2016-05-28 DIAGNOSIS — Y92099 Unspecified place in other non-institutional residence as the place of occurrence of the external cause: Secondary | ICD-10-CM

## 2016-05-28 DIAGNOSIS — Z9181 History of falling: Secondary | ICD-10-CM

## 2016-05-28 DIAGNOSIS — E559 Vitamin D deficiency, unspecified: Secondary | ICD-10-CM

## 2016-05-28 DIAGNOSIS — J9611 Chronic respiratory failure with hypoxia: Secondary | ICD-10-CM

## 2016-05-28 DIAGNOSIS — R2681 Unsteadiness on feet: Secondary | ICD-10-CM

## 2016-05-28 DIAGNOSIS — Z1231 Encounter for screening mammogram for malignant neoplasm of breast: Secondary | ICD-10-CM

## 2016-05-28 DIAGNOSIS — Z9189 Other specified personal risk factors, not elsewhere classified: Secondary | ICD-10-CM

## 2016-05-28 DIAGNOSIS — E785 Hyperlipidemia, unspecified: Secondary | ICD-10-CM | POA: Diagnosis not present

## 2016-05-28 DIAGNOSIS — Y92009 Unspecified place in unspecified non-institutional (private) residence as the place of occurrence of the external cause: Principal | ICD-10-CM

## 2016-05-28 NOTE — Patient Instructions (Addendum)
F/u mid Feb , call if you need me sooner  Non fast labs 1 week before visit  Please get life alert today  You are referred for pT twice weekly for 4 weeks due to recent fall  Pls schedule your mammogram at breast center   Please work on stopping smoking to improve  Your health   Steps to Quit Smoking Smoking tobacco can be bad for your health. It can also affect almost every organ in your body. Smoking puts you and people around you at risk for many serious long-lasting (chronic) diseases. Quitting smoking is hard, but it is one of the best things that you can do for your health. It is never too late to quit. What are the benefits of quitting smoking? When you quit smoking, you lower your risk for getting serious diseases and conditions. They can include:  Lung cancer or lung disease.  Heart disease.  Stroke.  Heart attack.  Not being able to have children (infertility).  Weak bones (osteoporosis) and broken bones (fractures). If you have coughing, wheezing, and shortness of breath, those symptoms may get better when you quit. You may also get sick less often. If you are pregnant, quitting smoking can help to lower your chances of having a baby of low birth weight. What can I do to help me quit smoking? Talk with your doctor about what can help you quit smoking. Some things you can do (strategies) include:  Quitting smoking totally, instead of slowly cutting back how much you smoke over a period of time.  Going to in-person counseling. You are more likely to quit if you go to many counseling sessions.  Using resources and support systems, such as:  Online chats with a Social worker.  Phone quitlines.  Printed Furniture conservator/restorer.  Support groups or group counseling.  Text messaging programs.  Mobile phone apps or applications.  Taking medicines. Some of these medicines may have nicotine in them. If you are pregnant or breastfeeding, do not take any medicines to quit  smoking unless your doctor says it is okay. Talk with your doctor about counseling or other things that can help you. Talk with your doctor about using more than one strategy at the same time, such as taking medicines while you are also going to in-person counseling. This can help make quitting easier. What things can I do to make it easier to quit? Quitting smoking might feel very hard at first, but there is a lot that you can do to make it easier. Take these steps:  Talk to your family and friends. Ask them to support and encourage you.  Call phone quitlines, reach out to support groups, or work with a Social worker.  Ask people who smoke to not smoke around you.  Avoid places that make you want (trigger) to smoke, such as:  Bars.  Parties.  Smoke-break areas at work.  Spend time with people who do not smoke.  Lower the stress in your life. Stress can make you want to smoke. Try these things to help your stress:  Getting regular exercise.  Deep-breathing exercises.  Yoga.  Meditating.  Doing a body scan. To do this, close your eyes, focus on one area of your body at a time from head to toe, and notice which parts of your body are tense. Try to relax the muscles in those areas.  Download or buy apps on your mobile phone or tablet that can help you stick to your quit plan. There are  many free apps, such as QuitGuide from the [employer] Office manager for Disease Control and Prevention). You can find more support from smokefree.gov and other websites. This information is not intended to replace advice given to you by your health care provider. Make sure you discuss any questions you have with your health care provider. Document Released: 03/15/2009 Document Revised: 01/15/2016 Document Reviewed: 10/03/2014 Elsevier Interactive Patient Education  2017 [employer].

## 2016-05-29 ENCOUNTER — Encounter: Payer: Self-pay | Admitting: Family Medicine

## 2016-05-29 DIAGNOSIS — W19XXXA Unspecified fall, initial encounter: Secondary | ICD-10-CM | POA: Insufficient documentation

## 2016-05-29 DIAGNOSIS — Y92009 Unspecified place in unspecified non-institutional (private) residence as the place of occurrence of the external cause: Principal | ICD-10-CM

## 2016-05-29 DIAGNOSIS — R2681 Unsteadiness on feet: Secondary | ICD-10-CM | POA: Insufficient documentation

## 2016-05-29 DIAGNOSIS — Z9181 History of falling: Secondary | ICD-10-CM | POA: Insufficient documentation

## 2016-05-29 NOTE — Assessment & Plan Note (Signed)
Controlled, no change in medication DASH diet and commitment to daily physical activity for a minimum of 30 minutes discussed and encouraged, as a part of hypertension management. The importance of attaining a healthy weight is also discussed.  BP/Weight 05/28/2016 01/22/2016 01/09/2016 12/28/2015 10/01/2015 06/18/2015 5/37/4827  Systolic BP 078 675 449 201 007 121 975  Diastolic BP 82 78 85 62 92 80 70  Wt. (Lbs) 252 229 248 247 230 226 217  BMI 44.64 40.57 43.93 43.75 40.75 40.04 38.45  Some encounter information is confidential and restricted. Go to Review Flowsheets activity to see all data.
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
## 2016-05-29 NOTE — Assessment & Plan Note (Signed)
Worsening due to ongoing nicotine use, treated by pulmonary

## 2016-05-29 NOTE — Assessment & Plan Note (Signed)
Controlled, no change in medication  

## 2016-05-29 NOTE — Assessment & Plan Note (Signed)
deteriortaed DASH diet and commitment to daily physical activity for a minimum of 30 minutes discussed and encouraged, as a part of hypertension management. The importance of attaining a healthy weight is also discussed.  BP/Weight 05/28/2016 01/22/2016 01/09/2016 12/28/2015 10/01/2015 06/18/2015 3/88/7195  Systolic BP 974 718 550 158 682 574 935  Diastolic BP 82 78 85 62 92 80 70  Wt. (Lbs) 252 229 248 247 230 226 217  BMI 44.64 40.57 43.93 43.75 40.75 40.04 38.45  Some encounter information is confidential and restricted. Go to Review Flowsheets activity to see all data.     Updated lab needed at/ before next visit.

## 2016-06-01 ENCOUNTER — Other Ambulatory Visit: Payer: Self-pay | Admitting: Family Medicine

## 2016-06-06 ENCOUNTER — Other Ambulatory Visit: Payer: Self-pay

## 2016-06-06 LAB — HM DIABETES EYE EXAM

## 2016-06-06 MED ORDER — HYDROCODONE-ACETAMINOPHEN 10-325 MG PO TABS: ORAL_TABLET | ORAL | 0 refills | Status: DC

## 2016-06-09 ENCOUNTER — Other Ambulatory Visit: Payer: Self-pay

## 2016-06-09 ENCOUNTER — Ambulatory Visit (INDEPENDENT_AMBULATORY_CARE_PROVIDER_SITE_OTHER): Payer: Medicare Other | Admitting: Psychiatry

## 2016-06-09 ENCOUNTER — Encounter (HOSPITAL_COMMUNITY): Payer: Self-pay | Admitting: Psychiatry

## 2016-06-09 ENCOUNTER — Ambulatory Visit (HOSPITAL_COMMUNITY): Payer: Self-pay | Admitting: Psychology

## 2016-06-09 ENCOUNTER — Telehealth: Payer: Self-pay | Admitting: Family Medicine

## 2016-06-09 VITALS — BP 150/88 | Ht 63.0 in | Wt 259.0 lb

## 2016-06-09 DIAGNOSIS — F333 Major depressive disorder, recurrent, severe with psychotic symptoms: Secondary | ICD-10-CM | POA: Diagnosis not present

## 2016-06-09 DIAGNOSIS — Z801 Family history of malignant neoplasm of trachea, bronchus and lung: Secondary | ICD-10-CM | POA: Diagnosis not present

## 2016-06-09 DIAGNOSIS — Z8249 Family history of ischemic heart disease and other diseases of the circulatory system: Secondary | ICD-10-CM

## 2016-06-09 DIAGNOSIS — Z794 Long term (current) use of insulin: Principal | ICD-10-CM

## 2016-06-09 DIAGNOSIS — Z823 Family history of stroke: Secondary | ICD-10-CM | POA: Diagnosis not present

## 2016-06-09 DIAGNOSIS — Z808 Family history of malignant neoplasm of other organs or systems: Secondary | ICD-10-CM

## 2016-06-09 DIAGNOSIS — Z811 Family history of alcohol abuse and dependence: Secondary | ICD-10-CM

## 2016-06-09 DIAGNOSIS — Z818 Family history of other mental and behavioral disorders: Secondary | ICD-10-CM

## 2016-06-09 DIAGNOSIS — Z841 Family history of disorders of kidney and ureter: Secondary | ICD-10-CM

## 2016-06-09 DIAGNOSIS — E119 Type 2 diabetes mellitus without complications: Principal | ICD-10-CM

## 2016-06-09 DIAGNOSIS — IMO0001 Reserved for inherently not codable concepts without codable children: Secondary | ICD-10-CM

## 2016-06-09 MED ORDER — HYDROCODONE-ACETAMINOPHEN 10-325 MG PO TABS: ORAL_TABLET | ORAL | 0 refills | Status: DC

## 2016-06-09 MED ORDER — ZOLPIDEM TARTRATE 10 MG PO TABS: 10.0000 mg | ORAL_TABLET | Freq: Every day | ORAL | 2 refills | Status: DC

## 2016-06-09 MED ORDER — ALPRAZOLAM 1 MG PO TABS: ORAL_TABLET | ORAL | 2 refills | Status: DC

## 2016-06-09 MED ORDER — FLUOXETINE HCL 20 MG PO CAPS: ORAL_CAPSULE | ORAL | 2 refills | Status: DC

## 2016-06-09 NOTE — Telephone Encounter (Signed)
Please advise 

## 2016-06-09 NOTE — Progress Notes (Signed)
Patient ID: [name], female   DOB: 03-Feb-1951, 66 y.o.   MRN: [id number] Patient ID: [name], female   DOB: Oct 18, 1950, 66 y.o.   MRN: [id number] Patient ID: [name], female   DOB: 01-22-1951, 66 y.o.   MRN: [id number] Patient ID: [name], female   DOB: 04/02/1951, 66 y.o.   MRN: [id number] Patient ID: [name], female   DOB: 08-28-1950, 66 y.o.   MRN: [id number] Patient ID: [name], female   DOB: 30-Nov-1950, 66 y.o.   MRN: [id number] Patient ID: [name], female   DOB: 08-17-1950, 66 y.o.   MRN: [id number]  Psychiatric Assessment Adult  Patient Identification:  [name] Date of Evaluation:  06/09/2016 Chief Complaint: depression History of Chief Complaint:   Chief Complaint  Patient presents with  . Depression  . Anxiety  . Follow-up    Depression         Associated symptoms include fatigue and appetite change.  Past medical history includes anxiety.   Anxiety  Symptoms include nervous/anxious behavior and shortness of breath.    this patient is a 66 year old widowed black female who lives alone in [place]. She has no children. She is on disability.  The patient was referred by Dr. Moshe Cipro her primary care physician and Dr. Jefm Miles her psychologist for further evaluation and treatment of major depression and anxiety.  The patient states that she's been depressed since her teen years. She's not really sure why. When she was in college she went through severe depression and had to quit. She's had this on and off through the years but really not receive treatment until a couple years ago after her husband died. Her primary care physician has put her on Prozac and Xanax which have helped to some degree.however recently she has been getting more depressed. She has severe COPD and is on oxygen. Yet, she continues to smoke THREE PACKS PER DAY which she claims is secondary to her depressed mood.  Recently she's gone through more  deaths including her sister in 2014 a niece in 2014. Another sister was recently diagnosed with cancer. The patient has gotten overwhelmed by all this and claims it's getting hard for her to just get out of bed and function. She has no energy, she's been shaky and having tremors in her hands, she complains of short-term memory loss.her sleep is variable and Ambien sometimes helps. Her appetite is up and down. Sometimes she hears the voices of her deceased relatives or "feels her spirits." She's never been to a psychiatric hospitalization  But has been seeing Dr. Jefm Miles in our office for several years. She has never been suicidal and states she is very "spiritual and values life. She stays away from people but she has a god niece and  God sister who check on her faithfully.she denies any other psychotic symptoms and does not use drugs or alcohol  The patient returns after 3 months. She states that she had a bad fall a few weeks ago. She fell and she couldn't get up and laid on the floor for 2 days. Finally her neighbor checked on her and had to call EMS. She refuses to go to the hospital. She has had a checkup with Dr. Moshe Cipro since then and has gotten a life alert necklace. She denies being depressed and does not think she was oversedated when she fell. She states that she fell over the tubing to her oxygen machine. She still thinks  her combination of medications are working well for her mood and she is bright and alert today. Review of Systems  Constitutional: Positive for activity change, appetite change and fatigue.  HENT: Negative.   Eyes: Negative.   Respiratory: Positive for shortness of breath.   Cardiovascular: Negative.   Gastrointestinal: Negative.   Endocrine: Negative.   Genitourinary: Negative.   Musculoskeletal: Positive for arthralgias.  Skin: Negative.   Allergic/Immunologic: Negative.   Neurological: Positive for tremors.  Hematological: Negative.   Psychiatric/Behavioral:  Positive for depression, dysphoric mood, hallucinations and sleep disturbance. The patient is nervous/anxious.    Physical Examnot done  Depressive Symptoms: depressed mood, anhedonia, psychomotor retardation, difficulty concentrating, impaired memory, anxiety, disturbed sleep, decreased appetite,  (Hypo) Manic Symptoms:   Elevated Mood:  No Irritable Mood:  No Grandiosity:  No Distractibility:  No Labiality of Mood:  No Delusions:  No Hallucinations:  Yes Impulsivity:  Yes Sexually Inappropriate Behavior:  No Financial Extravagance:  No Flight of Ideas:  No  Anxiety Symptoms: Excessive Worry:  Yes Panic Symptoms:  No Agoraphobia:  No Obsessive Compulsive: No  Symptoms: None, Specific Phobias:  No Social Anxiety:  No  Psychotic Symptoms:  Hallucinations: Yes Auditory Delusions:  No Paranoia:  No   Ideas of Reference:  No  PTSD Symptoms: Ever had a traumatic exposure:  No Had a traumatic exposure in the last month:  No Re-experiencing: No None Hypervigilance:  No Hyperarousal: No None Avoidance: No None  Traumatic Brain Injury: No   Past Psychiatric History: Diagnosis:Maj. depression  Hospitalizations: none  Outpatient Care: has been seeing Dr. Jefm Miles in our office for counseling  Substance Abuse Care: none  Self-Mutilation: none  Suicidal Attempts:none  Violent Behaviors: none   Past Medical History:   Past Medical History:  Diagnosis Date  . Anxiety   . COPD (chronic obstructive pulmonary disease) ([place])   . Depression 2008  . Diabetes mellitus, type 1 1983   type 2 diabetes  . DJD (degenerative joint disease)    of the spine   . GERD (gastroesophageal reflux disease)   . Hyperlipidemia 2007  . Hypertension 1983  . Hypothyroidism 2004   left thyroidectomy  . Insomnia   . Obesity    History of Loss of Consciousness:  No Seizure History:  No Cardiac History:  No Allergies:   Allergies  Allergen Reactions  . Codeine     REACTION:  nausea   Current Medications:  Current Outpatient Prescriptions  Medication Sig Dispense Refill  . acetaminophen (TYLENOL) 325 MG tablet Take 650 mg by mouth every 6 (six) hours as needed.    . ALPRAZolam (XANAX) 1 MG tablet TAKE ONE TABLET FOUR TIMES A DAY 120 tablet 2  . amLODipine (NORVASC) 5 MG tablet take 1 tablet by mouth once daily 90 tablet 1  . aspirin (ASPIRIN LOW DOSE) 81 MG EC tablet Take 81 mg by mouth daily.      . B-D INS SYR ULTRAFINE 1CC/31G 31G X 5/16" 1 ML MISC use as directed FOR ONCE DAILY TESTING 100 each 5  . Brexpiprazole (REXULTI) 1 MG TABS Take 1 mg by mouth at bedtime. 30 tablet 2  . calcium carbonate (TUMS) 500 MG chewable tablet Chew 1 tablet by mouth as needed.      . cloNIDine (CATAPRES) 0.2 MG tablet take 1 tablet by mouth at bedtime 90 tablet 1  . clotrimazole-betamethasone (LOTRISONE) cream Apply 1 application topically 2 (two) times daily. 30 g 0  . cyclobenzaprine (FLEXERIL) 10 MG  tablet TAKE 1 TABLET BY MOUTH AT BEDTIME. 30 tablet 5  . Diphenhyd-Hydrocort-Nystatin (FIRST-DUKES MOUTHWASH) SUSP 10 cc three times daily, gargle, swish and swallow, for 10 days 300 mL 0  . esomeprazole (NEXIUM) 40 MG capsule take 1 capsule by mouth once daily BEFORE BREAKFAST 30 capsule 4  . FLUoxetine (PROZAC) 20 MG capsule Take two in the am and one in the evening 90 capsule 2  . Fluticasone-Salmeterol (ADVAIR DISKUS) 250-50 MCG/DOSE AEPB inhale 1 dose by mouth twice a day 60 each 4  . Foot Care Products (DIABETIC INSOLES) MISC by Does not apply route. And Shoes    . furosemide (LASIX) 40 MG tablet Take 1 tablet (40 mg total) by mouth daily. 30 tablet 4  . glipiZIDE (GLUCOTROL XL) 2.5 MG 24 hr tablet take 1 tablet by mouth once daily 90 tablet 1  . Glucose Blood (BAYER BREEZE 2 TEST) DISK by In Vitro route. Lancets and strips for four times a day testing     . guaiFENesin 100 MG/5ML SOLN Take 100 mg by mouth 4 (four) times daily. Reported on 06/18/2015    .  HYDROcodone-acetaminophen (NORCO) 10-325 MG tablet One tablet once daily 30 tablet 0  . ipratropium-albuterol (DUONEB) 0.5-2.5 (3) MG/3ML SOLN Take 3 mLs by nebulization 4 (four) times daily as needed.     Marland Kitchen LANTUS 100 UNIT/ML injection INJECT 50 UNITS SUBCUTANEOUSLY ONCE DAILY 30 vial 3  . levalbuterol (XOPENEX HFA) 45 MCG/ACT inhaler Inhale 1-2 puffs into the lungs as needed.      Marland Kitchen levothyroxine (SYNTHROID, LEVOTHROID) 100 MCG tablet take 1 tablet by mouth once daily 90 tablet 1  . lisinopril-hydrochlorothiazide (PRINZIDE,ZESTORETIC) 20-12.5 MG tablet take 1 tablet by mouth once daily 90 tablet 1  . meloxicam (MOBIC) 7.5 MG tablet take 1 tablet once daily 90 tablet 1  . montelukast (SINGULAIR) 10 MG tablet Take 1 tablet (10 mg total) by mouth at bedtime. 30 tablet 4  . nystatin [facility]) powder apply to affected area twice a day if needed 60 g 0  . potassium chloride 20 MEQ/15ML (10%) solution TAKE 1 AND 1/2 TEASPOONFUL BY MOUTH ONCE DAILY 240 mL 2  . rosuvastatin (CRESTOR) 20 MG tablet take 1 tablet by mouth once daily 90 tablet 1  . silver sulfADIAZINE (SILVADENE) 1 % cream Apply 1 application topically daily. (Patient not taking: Reported on 03/10/2016) 50 g 0  . Vitamin D, Ergocalciferol, (DRISDOL) 50000 units CAPS capsule Take 1 capsule (50,000 Units total) by mouth every 7 (seven) days. 4 capsule 11  . zolpidem (AMBIEN) 10 MG tablet Take 1 tablet (10 mg total) by mouth at bedtime. 30 tablet 2   No current facility-administered medications for this visit.     Previous Psychotropic Medications:  Medication Dose   Wellbutrin-caused irritability                       Substance Abuse History in the last 12 months: Substance Age of 1st Use Last Use Amount Specific Type  Nicotine   Smokes 3 packs of cigarettes per day   Alcohol      Cannabis      Opiates      Cocaine      Methamphetamines      LSD      Ecstasy      Benzodiazepines      Caffeine      Inhalants      Others:  Medical Consequences of Substance Abuse: smoking is making her COPD much worse  Legal Consequences of Substance Abuse: none  Family Consequences of Substance Abuse: none  Blackouts:  No DT's:  No Withdrawal Symptoms:  No None  Social History: Current Place of Residence: [place] of Birth: [place] Family Members: 2 sisters Marital Status:  Widowed Children: none   Relationships:  Education:  Dentist Problems/Performance:  Religious Beliefs/Practices: Christian History of Abuse: none Energy manager History:  None. Legal History: none Hobbies/Interests: listening to gospel music  Family History:   Family History  Problem Relation Age of Onset  . Hypertension Mother   . Stroke Mother   . Cancer Father     lung   . Hypertension Sister   . Depression Sister   . Alcohol abuse Sister   . Hypertension Sister   . Depression Sister   . Cancer Sister   . Diabetes Sister   . Depression Sister   . Cancer Sister     renal   . Kidney failure Brother   . Kidney failure      family history   . Hypertension      family history     Mental Status Examination/Evaluation: Objective:  Appearance:  using portable oxygen,Walking very slowly disheveled   Eye Contact::  Good  Speech:  Clear and Coherent  Volume:  Decreased  Mood:good  Affect:  Bright  Thought Process:  Circumstantial  Orientation:  Full (Time, Place, and Person)  Thought Content:  Rumination   Suicidal Thoughts:  No  Homicidal Thoughts:  No  Judgement:  Fair  Insight:  Fair  Psychomotor Activity:  Decreased  Akathisia:  No  Handed:  Right  AIMS (if indicated):    Assets:  Communication Skills Desire for Improvement Social Support    Laboratory/X-Ray Psychological Evaluation(s)   reviewed in chart     Assessment:  Axis I: Major Depression, Recurrent severe  AXIS I Major Depression, Recurrent severe   AXIS II Deferred  AXIS III Past Medical History:  Diagnosis Date  . Anxiety   . COPD (chronic obstructive pulmonary disease) ([place])   . Depression 2008  . Diabetes mellitus, type 1 1983   type 2 diabetes  . DJD (degenerative joint disease)    of the spine   . GERD (gastroesophageal reflux disease)   . Hyperlipidemia 2007  . Hypertension 1983  . Hypothyroidism 2004   left thyroidectomy  . Insomnia   . Obesity      AXIS IV problems related to social environment  AXIS V 41-50 serious symptoms   Treatment Plan/Recommendations:  Plan of Care: medication management  Laboratory:   Psychotherapy: she is seeing Dr. Jefm Miles here  Medications: she'll continue Prozac 60 mg daily for depression and Xanax 1 mg 4 times a day for anxiety.She will Continue rexulti 1 mg daily for augmentation. She'll continue Ambien 10 mg at bedtime for sleep   Routine PRN Medications:  No  Consultations:   Safety Concerns:  She denies thoughts or plans of self-harm  Other:she'll return in 3 months     Tekla Malachowski, Neoma Laming, MD 1/8/20181:51 PM    Patient ID: [name], female   DOB: 1950-06-11, 66 y.o.   MRN: [id number]

## 2016-06-09 NOTE — Telephone Encounter (Signed)
I recommend initially lev emir at same dose as the lantus. PLS check with pharmacy to see if levemir will be covered before sending in and speaking to pt, if not pls send me a msg as alternatives which are covered by her ins, thanks

## 2016-06-09 NOTE — Telephone Encounter (Signed)
[name] is stating that her insulin LANTUS 100 UNIT/ML injection is now costing to much and she is asking what she is supposed to substitute with something else to improvise

## 2016-06-10 NOTE — Telephone Encounter (Signed)
Called and spoke with pharmacist.  Patient has not filled Lantus since 2015.  Attempted to reach patient.  # does not allow for message to be left  Will attempt to reach patient again

## 2016-06-16 ENCOUNTER — Ambulatory Visit (HOSPITAL_COMMUNITY): Payer: Medicare Other | Attending: Family Medicine | Admitting: Physical Therapy

## 2016-06-16 ENCOUNTER — Ambulatory Visit (HOSPITAL_COMMUNITY): Payer: Medicare Other | Admitting: Psychology

## 2016-06-16 ENCOUNTER — Other Ambulatory Visit: Payer: Self-pay | Admitting: Family Medicine

## 2016-06-16 NOTE — Telephone Encounter (Signed)
Patient states that she has been off Lantus x 1 year or more.  Please advise.

## 2016-06-16 NOTE — Telephone Encounter (Signed)
During this time her blood sugar has actually been controlled intermittently so I have removed it from list,  Needs hBA1C, chem 7 and eGFR non fast Jan 25 , will address med management after that, pls verify what she is aCTUALLY taking for blood sugar currently also and correct med list as far as that is concerned  ??/concerns pls let me know

## 2016-06-17 ENCOUNTER — Telehealth (HOSPITAL_COMMUNITY): Payer: Self-pay | Admitting: Physical Therapy

## 2016-06-17 ENCOUNTER — Telehealth (HOSPITAL_COMMUNITY): Payer: Self-pay | Admitting: *Deleted

## 2016-06-17 NOTE — Telephone Encounter (Signed)
Called patient back per her request; patient asked about and was educated about time/date of next scheduled session. She also asked about why her legs "do not work as well as they used to" and was educated that DPT will need to do tests and measures on her at evaluation to accurately answer that question.   Deniece Ree PT, DPT [phone]

## 2016-06-17 NOTE — Telephone Encounter (Signed)
voice message from patient.  would like phone call from Dr. Sima Matas.   She did not show for her appointment yesterday/06/16/16.

## 2016-06-20 NOTE — Addendum Note (Signed)
Addended by: Eual Fines on: 06/20/2016 01:05 PM   Modules accepted: Orders

## 2016-06-20 NOTE — Telephone Encounter (Signed)
Patient aware and med list correct and will have labs done next week

## 2016-06-25 ENCOUNTER — Telehealth (HOSPITAL_COMMUNITY): Payer: Self-pay | Admitting: Physical Therapy

## 2016-06-25 ENCOUNTER — Ambulatory Visit (HOSPITAL_COMMUNITY): Payer: Medicare Other | Admitting: Physical Therapy

## 2016-06-25 DIAGNOSIS — E119 Type 2 diabetes mellitus without complications: Secondary | ICD-10-CM | POA: Diagnosis not present

## 2016-06-25 DIAGNOSIS — Z794 Long term (current) use of insulin: Secondary | ICD-10-CM | POA: Diagnosis not present

## 2016-06-25 NOTE — Telephone Encounter (Signed)
Pt came in at 4:26 to late for evaluation. She will talk with Dr Moshe Cipro since she is feeling better. She has an apptment on 07/01/16, we will call after that date to reschedule if pt needs PT. NF 06/25/16

## 2016-06-26 LAB — BASIC METABOLIC PANEL WITH GFR
BUN: 22 mg/dL (ref 7–25)
CO2: 30 mmol/L (ref 20–31)
Calcium: 8.7 mg/dL (ref 8.6–10.4)
Chloride: 102 mmol/L (ref 98–110)
Creat: 1.39 mg/dL — ABNORMAL HIGH (ref 0.50–0.99)
GFR, EST NON AFRICAN AMERICAN: 40 mL/min — AB (ref >=60)
GFR, Est African American: 46 mL/min — ABNORMAL LOW (ref >=60)
Glucose, Bld: 127 mg/dL — ABNORMAL HIGH (ref 65–99)
POTASSIUM: 4.5 mmol/L (ref 3.5–5.3)
SODIUM: 140 mmol/L (ref 135–146)

## 2016-06-26 LAB — HEMOGLOBIN A1C
Hgb A1c MFr Bld: 8.7 % — ABNORMAL HIGH (ref <5.7)
MEAN PLASMA GLUCOSE: 203 mg/dL

## 2016-07-01 ENCOUNTER — Encounter: Payer: Self-pay | Admitting: Family Medicine

## 2016-07-01 ENCOUNTER — Ambulatory Visit (INDEPENDENT_AMBULATORY_CARE_PROVIDER_SITE_OTHER): Payer: Medicare Other | Admitting: Family Medicine

## 2016-07-01 VITALS — BP 128/82 | HR 92 | Temp 97.6°F | Resp 20 | Ht 63.0 in | Wt 249.0 lb

## 2016-07-01 DIAGNOSIS — E559 Vitamin D deficiency, unspecified: Secondary | ICD-10-CM

## 2016-07-01 DIAGNOSIS — E1169 Type 2 diabetes mellitus with other specified complication: Secondary | ICD-10-CM

## 2016-07-01 DIAGNOSIS — Z794 Long term (current) use of insulin: Secondary | ICD-10-CM | POA: Diagnosis not present

## 2016-07-01 DIAGNOSIS — F17210 Nicotine dependence, cigarettes, uncomplicated: Secondary | ICD-10-CM

## 2016-07-01 DIAGNOSIS — E669 Obesity, unspecified: Secondary | ICD-10-CM

## 2016-07-01 DIAGNOSIS — E1165 Type 2 diabetes mellitus with hyperglycemia: Secondary | ICD-10-CM | POA: Diagnosis not present

## 2016-07-01 DIAGNOSIS — F329 Major depressive disorder, single episode, unspecified: Secondary | ICD-10-CM

## 2016-07-01 DIAGNOSIS — I10 Essential (primary) hypertension: Secondary | ICD-10-CM

## 2016-07-01 DIAGNOSIS — E785 Hyperlipidemia, unspecified: Secondary | ICD-10-CM

## 2016-07-01 DIAGNOSIS — M4716 Other spondylosis with myelopathy, lumbar region: Secondary | ICD-10-CM

## 2016-07-01 DIAGNOSIS — F418 Other specified anxiety disorders: Secondary | ICD-10-CM

## 2016-07-01 DIAGNOSIS — F419 Anxiety disorder, unspecified: Secondary | ICD-10-CM

## 2016-07-01 DIAGNOSIS — IMO0001 Reserved for inherently not codable concepts without codable children: Secondary | ICD-10-CM

## 2016-07-01 DIAGNOSIS — E119 Type 2 diabetes mellitus without complications: Secondary | ICD-10-CM

## 2016-07-01 DIAGNOSIS — E038 Other specified hypothyroidism: Secondary | ICD-10-CM

## 2016-07-01 DIAGNOSIS — F17200 Nicotine dependence, unspecified, uncomplicated: Secondary | ICD-10-CM

## 2016-07-01 DIAGNOSIS — F32A Depression, unspecified: Secondary | ICD-10-CM

## 2016-07-01 MED ORDER — LINAGLIPTIN 5 MG PO TABS: 5.0000 mg | ORAL_TABLET | Freq: Every day | ORAL | 3 refills | Status: DC

## 2016-07-01 MED ORDER — GABAPENTIN 300 MG PO CAPS: 300.0000 mg | ORAL_CAPSULE | Freq: Every day | ORAL | 3 refills | Status: DC

## 2016-07-01 MED ORDER — GLIPIZIDE ER 5 MG PO TB24: 5.0000 mg | ORAL_TABLET | Freq: Every day | ORAL | 3 refills | Status: DC

## 2016-07-01 MED ORDER — ESOMEPRAZOLE MAGNESIUM 40 MG PO CPDR: DELAYED_RELEASE_CAPSULE | ORAL | 1 refills | Status: DC

## 2016-07-01 NOTE — Patient Instructions (Addendum)
F/u early May, call if you need me before  BRING ALL MEDICATION to visit  New for pain is gabapentin 300 m,g one at night  New for diabetes, is tradjenta one daily and higher dose of glipizide 5 mg one daily  MUST change eating and MUST take care of feet  Fasting lipid, cmp and EGFr, hBa1C , TSH and vit D in 3 month  Congrats down to 3 ciggs per day, QUITTING is in sight!        Steps to Quit Smoking Smoking tobacco can be bad for your health. It can also affect almost every organ in your body. Smoking puts you and people around you at risk for many serious long-lasting (chronic) diseases. Quitting smoking is hard, but it is one of the best things that you can do for your health. It is never too late to quit. What are the benefits of quitting smoking? When you quit smoking, you lower your risk for getting serious diseases and conditions. They can include:  Lung cancer or lung disease.  Heart disease.  Stroke.  Heart attack.  Not being able to have children (infertility).  Weak bones (osteoporosis) and broken bones (fractures). If you have coughing, wheezing, and shortness of breath, those symptoms may get better when you quit. You may also get sick less often. If you are pregnant, quitting smoking can help to lower your chances of having a baby of low birth weight. What can I do to help me quit smoking? Talk with your doctor about what can help you quit smoking. Some things you can do (strategies) include:  Quitting smoking totally, instead of slowly cutting back how much you smoke over a period of time.  Going to in-person counseling. You are more likely to quit if you go to many counseling sessions.  Using resources and support systems, such as:  Online chats with a Social worker.  Phone quitlines.  Printed Furniture conservator/restorer.  Support groups or group counseling.  Text messaging programs.  Mobile phone apps or applications.  Taking medicines. Some of these  medicines may have nicotine in them. If you are pregnant or breastfeeding, do not take any medicines to quit smoking unless your doctor says it is okay. Talk with your doctor about counseling or other things that can help you. Talk with your doctor about using more than one strategy at the same time, such as taking medicines while you are also going to in-person counseling. This can help make quitting easier. What things can I do to make it easier to quit? Quitting smoking might feel very hard at first, but there is a lot that you can do to make it easier. Take these steps:  Talk to your family and friends. Ask them to support and encourage you.  Call phone quitlines, reach out to support groups, or work with a Social worker.  Ask people who smoke to not smoke around you.  Avoid places that make you want (trigger) to smoke, such as:  Bars.  Parties.  Smoke-break areas at work.  Spend time with people who do not smoke.  Lower the stress in your life. Stress can make you want to smoke. Try these things to help your stress:  Getting regular exercise.  Deep-breathing exercises.  Yoga.  Meditating.  Doing a body scan. To do this, close your eyes, focus on one area of your body at a time from head to toe, and notice which parts of your body are tense. Try to relax the  muscles in those areas.  Download or buy apps on your mobile phone or tablet that can help you stick to your quit plan. There are many free apps, such as QuitGuide from the [employer] Office manager for Disease Control and Prevention). You can find more support from smokefree.gov and other websites. This information is not intended to replace advice given to you by your health care provider. Make sure you discuss any questions you have with your health care provider. Document Released: 03/15/2009 Document Revised: 01/15/2016 Document Reviewed: 10/03/2014 Elsevier Interactive Patient Education  2017 [employer].

## 2016-07-04 ENCOUNTER — Other Ambulatory Visit: Payer: Self-pay | Admitting: Family Medicine

## 2016-07-06 NOTE — Assessment & Plan Note (Signed)
Controlled, no change in medication DASH diet and commitment to daily physical activity for a minimum of 30 minutes discussed and encouraged, as a part of hypertension management. The importance of attaining a healthy weight is also discussed.  BP/Weight 07/01/2016 05/28/2016 01/22/2016 01/09/2016 12/28/2015 10/01/2015 0/16/0109  Systolic BP 323 557 322 025 427 062 376  Diastolic BP 82 82 78 85 62 92 80  Wt. (Lbs) 249 252 229 248 247 230 226  BMI 44.11 44.64 40.57 43.93 43.75 40.75 40.04  Some encounter information is confidential and restricted. Go to Review Flowsheets activity to see all data.

## 2016-07-06 NOTE — Assessment & Plan Note (Signed)
Hyperlipidemia:Low fat diet discussed and encouraged.   Lipid Panel  Lab Results  Component Value Date   CHOL 134 03/26/2016   HDL 56 03/26/2016   LDLCALC 60 03/26/2016   TRIG 90 03/26/2016   CHOLHDL 2.4 03/26/2016   Controlled, no change in medication Updated lab needed at/ before next visit.

## 2016-07-06 NOTE — Progress Notes (Signed)
[name]     MRN: [id number]      DOB: June 07, 1950   HPI Ms. Whitcher is here for follow up and re-evaluation of chronic medical conditions, medication management in particular chronic pain management, and review of any available recent lab and radiology data.  Preventive health is updated, specifically  Cancer screening and Immunization.   Questions or concerns regarding consultations or procedures which the PT has had in the interim are  addressed. The PT denies any adverse reactions to current medications since the last visit.  There are no new concerns.  There are no specific complaints   ROS Denies recent fever or chills. Denies sinus pressure, nasal congestion, ear pain or sore throat. Denies chest congestion, productive cough or wheezing. Denies chest pains, palpitations and leg swelling Denies abdominal pain, nausea, vomiting,diarrhea or constipation.   Denies dysuria, frequency, hesitancy or incontinence. C/o chronic  joint pain, and limitation in mobility.Relies on assistive device for safe ambulation and has recent h/o falls. Denies headaches, seizures, c/o chronic lower extremity  numbness, and  tingling. Denies uncontrolled  depression, anxiety or insomnia. Denies skin break down or rash.   PE  BP 128/82 (BP Location: Left Arm, Patient Position: Sitting, Cuff Size: Normal)   Pulse 92   Temp 97.6 F (36.4 C) (Oral)   Resp 20   Ht '5\' 3"'$  (1.6 m)   Wt 249 lb (112.9 kg)   SpO2 94% Comment: o2 8 lpm via n/c  BMI 44.11 kg/m   Patient alert and oriented and in no cardiopulmonary distress.  HEENT: No facial asymmetry, EOMI,   oropharynx pink and moist.  Neck decreased ROM no JVD, no mass.  Chest: Clear to auscultation bilaterally.Decreased air entry though adequate  CVS: S1, S2 no murmurs, no S3.Regular rate.  ABD: Soft non tender.   Ext: No edema  MS: Decreased ROM spine, shoulders, hips and knees.  Skin: Intact, no ulcerations or rash noted.  Psych:  Good eye contact, normal affect. Memory intact not anxious or depressed appearing.  CNS: CN 2-12 intact, power,  normal throughout.no focal deficits noted.   Assessment & Plan  Uncontrolled diabetes mellitus with diabetic nephropathy ([place]) deteriorated and uncontrolled  Ms. Tiso is reminded of the importance of commitment to daily physical activity for 30 minutes or more, as able and the need to limit carbohydrate intake to 30 to 60 grams per meal to help with blood sugar control.   The need to take medication as prescribed, test blood sugar as directed, and to call between visits if there is a concern that blood sugar is uncontrolled is also discussed.   Ms. Achille is reminded of the importance of daily foot exam, annual eye examination, and good blood sugar, blood pressure and cholesterol control.  Diabetic Labs Latest Ref Rng & Units 06/25/2016 03/26/2016 01/09/2016 12/27/2015 06/18/2015  HbA1c <5.7 % 8.7(H) 8.5(H) - 7.9(H) 7.8(H)  Microalbumin Not estab mg/dL - - - 1.0 -  Micro/Creat Ratio <30 mcg/mg creat - - - 6 -  Chol 125 - 200 mg/dL - 134 - - 149  HDL >=46 mg/dL - 56 - - 51  Calc LDL <130 mg/dL - 60 - - 81  Triglycerides <150 mg/dL - 90 - - 83  Creatinine 0.50 - 0.99 mg/dL 1.39(H) 1.25(H) 1.16(H) 1.25(H) 1.31(H)   BP/Weight 07/01/2016 05/28/2016 01/22/2016 01/09/2016 12/28/2015 10/01/2015 6/54/6503  Systolic BP 546 568 127 517 001 749 449  Diastolic BP 82 82 78 85 62 92 80  Wt. (Lbs) 249 252 229 248 247 230 226  BMI 44.11 44.64 40.57 43.93 43.75 40.75 40.04  Some encounter information is confidential and restricted. Go to Review Flowsheets activity to see all data.   Foot/eye exam completion dates Latest Ref Rng & Units 07/01/2016 06/06/2016  Eye Exam No Retinopathy - No Retinopathy  Foot exam Order - - -  Foot Form Completion - Done -        NICOTINE ADDICTION Patient counseled for approximately 5 minutes regarding the health risks of ongoing nicotine use, specifically all  types of cancer, heart disease, stroke and respiratory failure. The options available for help with cessation ,the behavioral changes to assist the process, and the option to either gradully reduce usage  Or abruptly stop.is also discussed. Pt is also encouraged to set specific goals in number of cigarettes used daily, as well as to set a quit date.     Hyperlipidemia LDL goal <100 Hyperlipidemia:Low fat diet discussed and encouraged.   Lipid Panel  Lab Results  Component Value Date   CHOL 134 03/26/2016   HDL 56 03/26/2016   LDLCALC 60 03/26/2016   TRIG 90 03/26/2016   CHOLHDL 2.4 03/26/2016   Controlled, no change in medication Updated lab needed at/ before next visit.     Essential hypertension Controlled, no change in medication DASH diet and commitment to daily physical activity for a minimum of 30 minutes discussed and encouraged, as a part of hypertension management. The importance of attaining a healthy weight is also discussed.  BP/Weight 07/01/2016 05/28/2016 01/22/2016 01/09/2016 12/28/2015 10/01/2015 06/16/7260  Systolic BP 035 597 416 384 536 468 032  Diastolic BP 82 82 78 85 62 92 80  Wt. (Lbs) 249 252 229 248 247 230 226  BMI 44.11 44.64 40.57 43.93 43.75 40.75 40.04  Some encounter information is confidential and restricted. Go to Review Flowsheets activity to see all data.       Morbid obesity Slightly improved Patient re-educated about  the importance of commitment to a  minimum of 150 minutes of exercise per week.  The importance of healthy food choices with portion control discussed. Encouraged to start a food diary, count calories and to consider  joining a support group. Sample diet sheets offered. Goals set by the patient for the next several months.   Weight /BMI 07/01/2016 05/28/2016 01/22/2016  WEIGHT 249 lb 252 lb 229 lb  HEIGHT '5\' 3"'$  '5\' 3"'$  '5\' 3"'$   BMI 44.11 kg/m2 44.64 kg/m2 40.57 kg/m2  Some encounter information is confidential and restricted.  Go to Review Flowsheets activity to see all data.      Anxiety and depression Managed by psych and controlled, requires high dose xanax, which will not be decreased  Osteoarthritis of spine Pain mangement is with gabapentin, meloxicam and flexeril, narcotic pain medication discontinued, and pt in agreement

## 2016-07-06 NOTE — Assessment & Plan Note (Signed)

## 2016-07-06 NOTE — Assessment & Plan Note (Signed)
Pain mangement is with gabapentin, meloxicam and flexeril, narcotic pain medication discontinued, and pt in agreement

## 2016-07-06 NOTE — Assessment & Plan Note (Signed)
Slightly improved Patient re-educated about  the importance of commitment to a  minimum of 150 minutes of exercise per week.  The importance of healthy food choices with portion control discussed. Encouraged to start a food diary, count calories and to consider  joining a support group. Sample diet sheets offered. Goals set by the patient for the next several months.   Weight /BMI 07/01/2016 05/28/2016 01/22/2016  WEIGHT 249 lb 252 lb 229 lb  HEIGHT '5\' 3"'$  '5\' 3"'$  '5\' 3"'$   BMI 44.11 kg/m2 44.64 kg/m2 40.57 kg/m2  Some encounter information is confidential and restricted. Go to Review Flowsheets activity to see all data.

## 2016-07-06 NOTE — Assessment & Plan Note (Signed)
Managed by psych and controlled, requires high dose xanax, which will not be decreased

## 2016-07-06 NOTE — Assessment & Plan Note (Signed)
deteriorated and uncontrolled  [name] is reminded of the importance of commitment to daily physical activity for 30 minutes or more, as able and the need to limit carbohydrate intake to 30 to 60 grams per meal to help with blood sugar control.   The need to take medication as prescribed, test blood sugar as directed, and to call between visits if there is a concern that blood sugar is uncontrolled is also discussed.   [name] is reminded of the importance of daily foot exam, annual eye examination, and good blood sugar, blood pressure and cholesterol control.  Diabetic Labs Latest Ref Rng & Units 06/25/2016 03/26/2016 01/09/2016 12/27/2015 06/18/2015  HbA1c <5.7 % 8.7(H) 8.5(H) - 7.9(H) 7.8(H)  Microalbumin Not estab mg/dL - - - 1.0 -  Micro/Creat Ratio <30 mcg/mg creat - - - 6 -  Chol 125 - 200 mg/dL - 134 - - 149  HDL >=46 mg/dL - 56 - - 51  Calc LDL <130 mg/dL - 60 - - 81  Triglycerides <150 mg/dL - 90 - - 83  Creatinine 0.50 - 0.99 mg/dL 1.39(H) 1.25(H) 1.16(H) 1.25(H) 1.31(H)   BP/Weight 07/01/2016 05/28/2016 01/22/2016 01/09/2016 12/28/2015 10/01/2015 07/26/8248  Systolic BP 037 048 889 169 450 388 828  Diastolic BP 82 82 78 85 62 92 80  Wt. (Lbs) 249 252 229 248 247 230 226  BMI 44.11 44.64 40.57 43.93 43.75 40.75 40.04  Some encounter information is confidential and restricted. Go to Review Flowsheets activity to see all data.   Foot/eye exam completion dates Latest Ref Rng & Units 07/01/2016 06/06/2016  Eye Exam No Retinopathy - No Retinopathy  Foot exam Order - - -  Foot Form Completion - Done -

## 2016-07-07 ENCOUNTER — Telehealth (HOSPITAL_COMMUNITY): Payer: Self-pay | Admitting: Family Medicine

## 2016-07-07 NOTE — Telephone Encounter (Signed)
07/07/16 pt called [place] back to say that she talked to Dr. Moshe Cipro and she will not be taking therapy

## 2016-07-08 ENCOUNTER — Telehealth: Payer: Self-pay

## 2016-07-08 NOTE — Telephone Encounter (Signed)
PA not needed.  Because med is not offered on the market as a generic that is the patient's copay.  Please advise.

## 2016-07-08 NOTE — Telephone Encounter (Signed)
Pt haas CKD, pls try to get this pA , it should work!

## 2016-07-08 NOTE — Telephone Encounter (Signed)
Would recommend rying to pursue or send info to her ins that she does have CKD and see if she can get help with the cost in some way!, if no clear  Response within 48 hrs I will look at working with the meds she can afford some more also

## 2016-07-09 NOTE — Telephone Encounter (Signed)
Will call and check with pharmacy to see if high co pay is due to needing to meet deductible or if there is another reason.

## 2016-07-10 ENCOUNTER — Telehealth: Payer: Self-pay

## 2016-07-10 NOTE — Telephone Encounter (Signed)
Pt aware and will call back with fax number

## 2016-07-10 NOTE — Telephone Encounter (Signed)
Yes oK to excuse , pls type , I will sign

## 2016-07-10 NOTE — Telephone Encounter (Signed)
Noted  

## 2016-07-11 ENCOUNTER — Other Ambulatory Visit: Payer: Self-pay | Admitting: Family Medicine

## 2016-07-11 NOTE — Telephone Encounter (Signed)
Per pharmacy the $200 copay is the patient's due amount because medication is not covered due to being only offered as a brand medication.   Do you want to refer to health department or increase Glipizide?  Please advise.

## 2016-07-11 NOTE — Telephone Encounter (Signed)
Dose  Of glipizide is increased, and entered historically, I have d/c strident. Pls notify pt and pharmacy, thanks

## 2016-07-14 ENCOUNTER — Other Ambulatory Visit: Payer: Self-pay

## 2016-07-14 MED ORDER — GLIPIZIDE ER 10 MG PO TB24: 10.0000 mg | ORAL_TABLET | Freq: Every day | ORAL | 3 refills | Status: DC

## 2016-07-14 NOTE — Telephone Encounter (Signed)
Attempted to reach patient on both #s.  Unable to leave message.  Will try back later.

## 2016-07-14 NOTE — Telephone Encounter (Signed)
Patient aware.  Medication sent to pharmacy.  

## 2016-07-18 ENCOUNTER — Emergency Department (HOSPITAL_COMMUNITY): Payer: Medicare Other

## 2016-07-18 ENCOUNTER — Telehealth: Payer: Self-pay

## 2016-07-18 ENCOUNTER — Inpatient Hospital Stay (HOSPITAL_COMMUNITY)
Admission: EM | Admit: 2016-07-18 | Discharge: 2016-07-21 | DRG: 189 | Disposition: A | Discharge status: Home Health | Payer: Medicare Other | Attending: Internal Medicine | Admitting: Internal Medicine

## 2016-07-18 ENCOUNTER — Encounter (HOSPITAL_COMMUNITY): Payer: Self-pay | Admitting: Emergency Medicine

## 2016-07-18 DIAGNOSIS — E1121 Type 2 diabetes mellitus with diabetic nephropathy: Secondary | ICD-10-CM | POA: Diagnosis not present

## 2016-07-18 DIAGNOSIS — F419 Anxiety disorder, unspecified: Secondary | ICD-10-CM | POA: Diagnosis present

## 2016-07-18 DIAGNOSIS — J9621 Acute and chronic respiratory failure with hypoxia: Principal | ICD-10-CM | POA: Diagnosis present

## 2016-07-18 DIAGNOSIS — F17210 Nicotine dependence, cigarettes, uncomplicated: Secondary | ICD-10-CM | POA: Diagnosis present

## 2016-07-18 DIAGNOSIS — Z79899 Other long term (current) drug therapy: Secondary | ICD-10-CM

## 2016-07-18 DIAGNOSIS — E875 Hyperkalemia: Secondary | ICD-10-CM | POA: Diagnosis present

## 2016-07-18 DIAGNOSIS — R748 Abnormal levels of other serum enzymes: Secondary | ICD-10-CM | POA: Diagnosis not present

## 2016-07-18 DIAGNOSIS — T424X1A Poisoning by benzodiazepines, accidental (unintentional), initial encounter: Secondary | ICD-10-CM | POA: Diagnosis present

## 2016-07-18 DIAGNOSIS — Z6841 Body Mass Index (BMI) 40.0 and over, adult: Secondary | ICD-10-CM | POA: Diagnosis not present

## 2016-07-18 DIAGNOSIS — G4700 Insomnia, unspecified: Secondary | ICD-10-CM | POA: Diagnosis present

## 2016-07-18 DIAGNOSIS — K219 Gastro-esophageal reflux disease without esophagitis: Secondary | ICD-10-CM | POA: Diagnosis present

## 2016-07-18 DIAGNOSIS — M549 Dorsalgia, unspecified: Secondary | ICD-10-CM | POA: Diagnosis present

## 2016-07-18 DIAGNOSIS — Z7982 Long term (current) use of aspirin: Secondary | ICD-10-CM

## 2016-07-18 DIAGNOSIS — R202 Paresthesia of skin: Secondary | ICD-10-CM

## 2016-07-18 DIAGNOSIS — Z66 Do not resuscitate: Secondary | ICD-10-CM | POA: Diagnosis present

## 2016-07-18 DIAGNOSIS — E785 Hyperlipidemia, unspecified: Secondary | ICD-10-CM | POA: Diagnosis present

## 2016-07-18 DIAGNOSIS — R4781 Slurred speech: Secondary | ICD-10-CM | POA: Diagnosis not present

## 2016-07-18 DIAGNOSIS — F329 Major depressive disorder, single episode, unspecified: Secondary | ICD-10-CM | POA: Diagnosis present

## 2016-07-18 DIAGNOSIS — I671 Cerebral aneurysm, nonruptured: Secondary | ICD-10-CM | POA: Diagnosis not present

## 2016-07-18 DIAGNOSIS — J9601 Acute respiratory failure with hypoxia: Secondary | ICD-10-CM | POA: Diagnosis present

## 2016-07-18 DIAGNOSIS — E872 Acidosis: Secondary | ICD-10-CM | POA: Diagnosis present

## 2016-07-18 DIAGNOSIS — J8410 Pulmonary fibrosis, unspecified: Secondary | ICD-10-CM | POA: Diagnosis present

## 2016-07-18 DIAGNOSIS — Z794 Long term (current) use of insulin: Secondary | ICD-10-CM | POA: Diagnosis not present

## 2016-07-18 DIAGNOSIS — J449 Chronic obstructive pulmonary disease, unspecified: Secondary | ICD-10-CM

## 2016-07-18 DIAGNOSIS — R061 Stridor: Secondary | ICD-10-CM | POA: Diagnosis not present

## 2016-07-18 DIAGNOSIS — J441 Chronic obstructive pulmonary disease with (acute) exacerbation: Secondary | ICD-10-CM | POA: Diagnosis present

## 2016-07-18 DIAGNOSIS — R531 Weakness: Secondary | ICD-10-CM

## 2016-07-18 DIAGNOSIS — E039 Hypothyroidism, unspecified: Secondary | ICD-10-CM | POA: Diagnosis present

## 2016-07-18 DIAGNOSIS — I482 Chronic atrial fibrillation: Secondary | ICD-10-CM | POA: Diagnosis not present

## 2016-07-18 DIAGNOSIS — I10 Essential (primary) hypertension: Secondary | ICD-10-CM | POA: Diagnosis present

## 2016-07-18 DIAGNOSIS — E1165 Type 2 diabetes mellitus with hyperglycemia: Secondary | ICD-10-CM | POA: Diagnosis present

## 2016-07-18 DIAGNOSIS — R778 Other specified abnormalities of plasma proteins: Secondary | ICD-10-CM | POA: Diagnosis present

## 2016-07-18 DIAGNOSIS — Z9981 Dependence on supplemental oxygen: Secondary | ICD-10-CM

## 2016-07-18 DIAGNOSIS — R404 Transient alteration of awareness: Secondary | ICD-10-CM | POA: Diagnosis not present

## 2016-07-18 DIAGNOSIS — G894 Chronic pain syndrome: Secondary | ICD-10-CM | POA: Diagnosis present

## 2016-07-18 DIAGNOSIS — R4182 Altered mental status, unspecified: Secondary | ICD-10-CM | POA: Diagnosis not present

## 2016-07-18 DIAGNOSIS — J984 Other disorders of lung: Secondary | ICD-10-CM | POA: Diagnosis not present

## 2016-07-18 DIAGNOSIS — R7989 Other specified abnormal findings of blood chemistry: Secondary | ICD-10-CM

## 2016-07-18 DIAGNOSIS — E669 Obesity, unspecified: Secondary | ICD-10-CM | POA: Diagnosis present

## 2016-07-18 DIAGNOSIS — IMO0002 Reserved for concepts with insufficient information to code with codable children: Secondary | ICD-10-CM | POA: Diagnosis present

## 2016-07-18 DIAGNOSIS — R918 Other nonspecific abnormal finding of lung field: Secondary | ICD-10-CM | POA: Diagnosis not present

## 2016-07-18 DIAGNOSIS — I6523 Occlusion and stenosis of bilateral carotid arteries: Secondary | ICD-10-CM | POA: Diagnosis present

## 2016-07-18 LAB — CBC WITH DIFFERENTIAL/PLATELET
BASOS ABS: 0.1 10*3/uL (ref 0.0–0.1)
BASOS PCT: 1 %
EOS ABS: 1 10*3/uL — AB (ref 0.0–0.7)
EOS PCT: 9 %
HEMATOCRIT: 35.7 % — AB (ref 36.0–46.0)
Hemoglobin: 11 g/dL — ABNORMAL LOW (ref 12.0–15.0)
Lymphocytes Relative: 21 %
Lymphs Abs: 2.3 10*3/uL (ref 0.7–4.0)
MCH: 30.1 pg (ref 26.0–34.0)
MCHC: 30.8 g/dL (ref 30.0–36.0)
MCV: 97.8 fL (ref 78.0–100.0)
MONO ABS: 0.7 10*3/uL (ref 0.1–1.0)
MONOS PCT: 6 %
Neutro Abs: 7 10*3/uL (ref 1.7–7.7)
Neutrophils Relative %: 63 %
PLATELETS: 281 10*3/uL (ref 150–400)
RBC: 3.65 MIL/uL — ABNORMAL LOW (ref 3.87–5.11)
RDW: 13.7 % (ref 11.5–15.5)
WBC: 11 10*3/uL — ABNORMAL HIGH (ref 4.0–10.5)

## 2016-07-18 LAB — LACTIC ACID, PLASMA
LACTIC ACID, VENOUS: 1 mmol/L (ref 0.5–1.9)
Lactic Acid, Venous: 1.2 mmol/L (ref 0.5–1.9)

## 2016-07-18 LAB — COMPREHENSIVE METABOLIC PANEL
ALT: 8 U/L — ABNORMAL LOW (ref 14–54)
ANION GAP: 7 (ref 5–15)
AST: 17 U/L (ref 15–41)
Albumin: 3.5 g/dL (ref 3.5–5.0)
Alkaline Phosphatase: 93 U/L (ref 38–126)
BILIRUBIN TOTAL: 0.4 mg/dL (ref 0.3–1.2)
BUN: 39 mg/dL — ABNORMAL HIGH (ref 6–20)
CHLORIDE: 104 mmol/L (ref 101–111)
CO2: 30 mmol/L (ref 22–32)
Calcium: 9.6 mg/dL (ref 8.9–10.3)
Creatinine, Ser: 1.35 mg/dL — ABNORMAL HIGH (ref 0.44–1.00)
GFR calc Af Amer: 47 mL/min — ABNORMAL LOW (ref >60)
GFR calc non Af Amer: 41 mL/min — ABNORMAL LOW (ref >60)
GLUCOSE: 121 mg/dL — AB (ref 65–99)
POTASSIUM: 5.5 mmol/L — AB (ref 3.5–5.1)
Sodium: 141 mmol/L (ref 135–145)
TOTAL PROTEIN: 7.4 g/dL (ref 6.5–8.1)

## 2016-07-18 LAB — URINALYSIS, ROUTINE W REFLEX MICROSCOPIC
Bilirubin Urine: NEGATIVE
Glucose, UA: NEGATIVE mg/dL
Hgb urine dipstick: NEGATIVE
KETONES UR: NEGATIVE mg/dL
LEUKOCYTES UA: NEGATIVE
NITRITE: NEGATIVE
Protein, ur: NEGATIVE mg/dL
SPECIFIC GRAVITY, URINE: 1.011 (ref 1.005–1.030)
pH: 5 (ref 5.0–8.0)

## 2016-07-18 LAB — BLOOD GAS, ARTERIAL
ACID-BASE EXCESS: 2.3 mmol/L — AB (ref 0.0–2.0)
BICARBONATE: 25.2 mmol/L (ref 20.0–28.0)
Drawn by: 234301
O2 Content: 4 L/min
O2 SAT: 93.8 %
PCO2 ART: 68.4 mmHg — AB (ref 32.0–48.0)
PO2 ART: 80.8 mmHg — AB (ref 83.0–108.0)
pH, Arterial: 7.249 — ABNORMAL LOW (ref 7.350–7.450)

## 2016-07-18 LAB — RAPID URINE DRUG SCREEN, HOSP PERFORMED
Amphetamines: NOT DETECTED
BARBITURATES: NOT DETECTED
Benzodiazepines: POSITIVE — AB
Cocaine: NOT DETECTED
Opiates: POSITIVE — AB
Tetrahydrocannabinol: NOT DETECTED

## 2016-07-18 LAB — TROPONIN I
TROPONIN I: 0.03 ng/mL — AB (ref <0.03)
Troponin I: <0.03 ng/mL (ref <0.03)

## 2016-07-18 LAB — PROTIME-INR
INR: 1.01
PROTHROMBIN TIME: 13.3 s (ref 11.4–15.2)

## 2016-07-18 LAB — GLUCOSE, CAPILLARY: Glucose-Capillary: 153 mg/dL — ABNORMAL HIGH (ref 65–99)

## 2016-07-18 LAB — CBG MONITORING, ED: GLUCOSE-CAPILLARY: 70 mg/dL (ref 65–99)

## 2016-07-18 LAB — BRAIN NATRIURETIC PEPTIDE: B Natriuretic Peptide: 24 pg/mL (ref 0.0–100.0)

## 2016-07-18 LAB — ETHANOL: Alcohol, Ethyl (B): <5 mg/dL (ref <5)

## 2016-07-18 MED ORDER — CYCLOBENZAPRINE HCL 10 MG PO TABS
10.0000 mg | ORAL_TABLET | Freq: Every day | ORAL | Status: DC
  Administered 2016-07-19: 10 mg via ORAL
  Filled 2016-07-18 (×2): qty 1

## 2016-07-18 MED ORDER — METHYLPREDNISOLONE SODIUM SUCC 125 MG IJ SOLR
125.0000 mg | Freq: Once | INTRAMUSCULAR | Status: AC
  Administered 2016-07-18: 125 mg via INTRAVENOUS
  Filled 2016-07-18: qty 2

## 2016-07-18 MED ORDER — INSULIN ASPART 100 UNIT/ML ~~LOC~~ SOLN
4.0000 [IU] | Freq: Three times a day (TID) | SUBCUTANEOUS | Status: DC
Start: 2016-07-19 — End: 2016-07-21
  Administered 2016-07-19 – 2016-07-21 (×8): 4 [IU] via SUBCUTANEOUS

## 2016-07-18 MED ORDER — INSULIN GLARGINE 100 UNIT/ML ~~LOC~~ SOLN
20.0000 [IU] | Freq: Every day | SUBCUTANEOUS | Status: DC
  Administered 2016-07-19 – 2016-07-20 (×2): 20 [IU] via SUBCUTANEOUS
  Filled 2016-07-18 (×4): qty 0.2

## 2016-07-18 MED ORDER — ZOLPIDEM TARTRATE 5 MG PO TABS
5.0000 mg | ORAL_TABLET | Freq: Every day | ORAL | Status: DC
  Administered 2016-07-19 – 2016-07-20 (×2): 5 mg via ORAL
  Filled 2016-07-18 (×2): qty 1

## 2016-07-18 MED ORDER — INSULIN ASPART 100 UNIT/ML ~~LOC~~ SOLN
0.0000 [IU] | Freq: Three times a day (TID) | SUBCUTANEOUS | Status: DC
  Administered 2016-07-19: 11 [IU] via SUBCUTANEOUS
  Administered 2016-07-19: 4 [IU] via SUBCUTANEOUS
  Administered 2016-07-19: 11 [IU] via SUBCUTANEOUS
  Administered 2016-07-20: 4 [IU] via SUBCUTANEOUS
  Administered 2016-07-20: 15 [IU] via SUBCUTANEOUS
  Administered 2016-07-20: 20 [IU] via SUBCUTANEOUS
  Administered 2016-07-21: 15 [IU] via SUBCUTANEOUS
  Administered 2016-07-21: 7 [IU] via SUBCUTANEOUS

## 2016-07-18 MED ORDER — ENOXAPARIN SODIUM 60 MG/0.6ML ~~LOC~~ SOLN
60.0000 mg | SUBCUTANEOUS | Status: DC
  Administered 2016-07-18 – 2016-07-20 (×3): 60 mg via SUBCUTANEOUS
  Filled 2016-07-18 (×3): qty 0.6

## 2016-07-18 MED ORDER — ONDANSETRON HCL 4 MG PO TABS: 4.0000 mg | ORAL_TABLET | Freq: Four times a day (QID) | ORAL | Status: DC | PRN

## 2016-07-18 MED ORDER — MOMETASONE FURO-FORMOTEROL FUM 200-5 MCG/ACT IN AERO
2.0000 | INHALATION_SPRAY | Freq: Two times a day (BID) | RESPIRATORY_TRACT | Status: DC
  Administered 2016-07-19 – 2016-07-21 (×5): 2 via RESPIRATORY_TRACT
  Filled 2016-07-18: qty 8.8

## 2016-07-18 MED ORDER — LISINOPRIL 10 MG PO TABS
20.0000 mg | ORAL_TABLET | Freq: Every day | ORAL | Status: DC
  Administered 2016-07-19 – 2016-07-21 (×3): 20 mg via ORAL
  Filled 2016-07-18: qty 4
  Filled 2016-07-18 (×2): qty 2

## 2016-07-18 MED ORDER — ALBUTEROL SULFATE (2.5 MG/3ML) 0.083% IN NEBU
2.5000 mg | INHALATION_SOLUTION | Freq: Four times a day (QID) | RESPIRATORY_TRACT | Status: DC
  Administered 2016-07-18 – 2016-07-21 (×11): 2.5 mg via RESPIRATORY_TRACT
  Filled 2016-07-18 (×12): qty 3

## 2016-07-18 MED ORDER — LEVOTHYROXINE SODIUM 100 MCG PO TABS
100.0000 ug | ORAL_TABLET | Freq: Every day | ORAL | Status: DC
  Administered 2016-07-19 – 2016-07-21 (×3): 100 ug via ORAL
  Filled 2016-07-18 (×3): qty 1

## 2016-07-18 MED ORDER — ROSUVASTATIN CALCIUM 20 MG PO TABS
20.0000 mg | ORAL_TABLET | Freq: Every day | ORAL | Status: DC
  Administered 2016-07-19 – 2016-07-20 (×2): 20 mg via ORAL
  Filled 2016-07-18 (×2): qty 1

## 2016-07-18 MED ORDER — METHYLPREDNISOLONE SODIUM SUCC 125 MG IJ SOLR
60.0000 mg | Freq: Four times a day (QID) | INTRAMUSCULAR | Status: DC
  Administered 2016-07-18 – 2016-07-21 (×11): 60 mg via INTRAVENOUS
  Filled 2016-07-18 (×11): qty 2

## 2016-07-18 MED ORDER — CLONIDINE HCL 0.2 MG PO TABS
0.2000 mg | ORAL_TABLET | Freq: Every day | ORAL | Status: DC
  Administered 2016-07-18 – 2016-07-20 (×3): 0.2 mg via ORAL
  Filled 2016-07-18 (×3): qty 1

## 2016-07-18 MED ORDER — INSULIN ASPART 100 UNIT/ML ~~LOC~~ SOLN
0.0000 [IU] | Freq: Every day | SUBCUTANEOUS | Status: DC
  Administered 2016-07-19 – 2016-07-20 (×2): 3 [IU] via SUBCUTANEOUS

## 2016-07-18 MED ORDER — GABAPENTIN 300 MG PO CAPS
300.0000 mg | ORAL_CAPSULE | Freq: Every day | ORAL | Status: DC
  Administered 2016-07-19 – 2016-07-20 (×2): 300 mg via ORAL
  Filled 2016-07-18 (×3): qty 1

## 2016-07-18 MED ORDER — IPRATROPIUM BROMIDE 0.02 % IN SOLN
1.0000 mg | Freq: Once | RESPIRATORY_TRACT | Status: AC
  Administered 2016-07-18: 1 mg via RESPIRATORY_TRACT
  Filled 2016-07-18: qty 5

## 2016-07-18 MED ORDER — ALBUTEROL (5 MG/ML) CONTINUOUS INHALATION SOLN
10.0000 mg/h | INHALATION_SOLUTION | Freq: Once | RESPIRATORY_TRACT | Status: AC
  Administered 2016-07-18: 10 mg/h via RESPIRATORY_TRACT
  Filled 2016-07-18: qty 20

## 2016-07-18 MED ORDER — HYDROCHLOROTHIAZIDE 12.5 MG PO CAPS
12.5000 mg | ORAL_CAPSULE | Freq: Every day | ORAL | Status: DC
  Administered 2016-07-19 – 2016-07-21 (×3): 12.5 mg via ORAL
  Filled 2016-07-18 (×3): qty 1

## 2016-07-18 MED ORDER — NALOXONE HCL 0.4 MG/ML IJ SOLN
0.4000 mg | Freq: Once | INTRAMUSCULAR | Status: AC
  Administered 2016-07-18: 0.4 mg via INTRAVENOUS
  Filled 2016-07-18: qty 1

## 2016-07-18 MED ORDER — ASPIRIN EC 81 MG PO TBEC
81.0000 mg | DELAYED_RELEASE_TABLET | Freq: Every day | ORAL | Status: DC
  Administered 2016-07-19 – 2016-07-21 (×3): 81 mg via ORAL
  Filled 2016-07-18 (×3): qty 1

## 2016-07-18 MED ORDER — FLUOXETINE HCL 20 MG PO CAPS: 20.0000 mg | ORAL_CAPSULE | Freq: Two times a day (BID) | ORAL | Status: DC

## 2016-07-18 MED ORDER — TIOTROPIUM BROMIDE MONOHYDRATE 18 MCG IN CAPS
18.0000 ug | ORAL_CAPSULE | Freq: Every day | RESPIRATORY_TRACT | Status: DC
  Administered 2016-07-20 – 2016-07-21 (×2): 18 ug via RESPIRATORY_TRACT
  Filled 2016-07-18: qty 5

## 2016-07-18 MED ORDER — POLYETHYLENE GLYCOL 3350 17 G PO PACK: 17.0000 g | PACK | Freq: Every day | ORAL | Status: DC | PRN

## 2016-07-18 MED ORDER — LISINOPRIL-HYDROCHLOROTHIAZIDE 20-12.5 MG PO TABS: 1.0000 | ORAL_TABLET | Freq: Every day | ORAL | Status: DC

## 2016-07-18 MED ORDER — FLUOXETINE HCL 20 MG PO CAPS
40.0000 mg | ORAL_CAPSULE | Freq: Every day | ORAL | Status: DC
  Administered 2016-07-19 – 2016-07-21 (×3): 40 mg via ORAL
  Filled 2016-07-18 (×3): qty 2

## 2016-07-18 MED ORDER — ALBUTEROL SULFATE (2.5 MG/3ML) 0.083% IN NEBU
2.5000 mg | INHALATION_SOLUTION | Freq: Once | RESPIRATORY_TRACT | Status: AC
  Administered 2016-07-18: 2.5 mg via RESPIRATORY_TRACT
  Filled 2016-07-18: qty 3

## 2016-07-18 MED ORDER — CHLORHEXIDINE GLUCONATE 0.12 % MT SOLN
15.0000 mL | Freq: Two times a day (BID) | OROMUCOSAL | Status: DC
  Administered 2016-07-19 – 2016-07-21 (×5): 15 mL via OROMUCOSAL
  Filled 2016-07-18 (×5): qty 15

## 2016-07-18 MED ORDER — AMLODIPINE BESYLATE 5 MG PO TABS
5.0000 mg | ORAL_TABLET | Freq: Every day | ORAL | Status: DC
  Administered 2016-07-19 – 2016-07-21 (×3): 5 mg via ORAL
  Filled 2016-07-18 (×3): qty 1

## 2016-07-18 MED ORDER — STROKE: EARLY STAGES OF RECOVERY BOOK
Freq: Once | Status: DC
  Filled 2016-07-18: qty 1

## 2016-07-18 MED ORDER — GUAIFENESIN ER 600 MG PO TB12
600.0000 mg | ORAL_TABLET | Freq: Two times a day (BID) | ORAL | Status: DC
  Administered 2016-07-19 – 2016-07-21 (×5): 600 mg via ORAL
  Filled 2016-07-18 (×6): qty 1

## 2016-07-18 MED ORDER — IPRATROPIUM-ALBUTEROL 0.5-2.5 (3) MG/3ML IN SOLN
3.0000 mL | Freq: Once | RESPIRATORY_TRACT | Status: AC
  Administered 2016-07-18: 3 mL via RESPIRATORY_TRACT
  Filled 2016-07-18: qty 3

## 2016-07-18 MED ORDER — MONTELUKAST SODIUM 10 MG PO TABS
10.0000 mg | ORAL_TABLET | Freq: Every day | ORAL | Status: DC
  Administered 2016-07-19 – 2016-07-20 (×2): 10 mg via ORAL
  Filled 2016-07-18 (×3): qty 1

## 2016-07-18 MED ORDER — ONDANSETRON HCL 4 MG/2ML IJ SOLN: 4.0000 mg | Freq: Four times a day (QID) | INTRAMUSCULAR | Status: DC | PRN

## 2016-07-18 MED ORDER — FLUOXETINE HCL 20 MG PO CAPS
20.0000 mg | ORAL_CAPSULE | Freq: Every day | ORAL | Status: DC
  Administered 2016-07-19 – 2016-07-20 (×2): 20 mg via ORAL
  Filled 2016-07-18 (×3): qty 1

## 2016-07-18 MED ORDER — ALBUTEROL SULFATE (2.5 MG/3ML) 0.083% IN NEBU
2.5000 mg | INHALATION_SOLUTION | RESPIRATORY_TRACT | Status: DC | PRN
  Administered 2016-07-20 (×2): 2.5 mg via RESPIRATORY_TRACT
  Filled 2016-07-18: qty 3

## 2016-07-18 MED ORDER — ALPRAZOLAM 1 MG PO TABS
1.0000 mg | ORAL_TABLET | Freq: Four times a day (QID) | ORAL | Status: DC | PRN
  Administered 2016-07-19: 1 mg via ORAL
  Filled 2016-07-18: qty 1

## 2016-07-18 MED ORDER — PANTOPRAZOLE SODIUM 40 MG PO TBEC
80.0000 mg | DELAYED_RELEASE_TABLET | Freq: Every day | ORAL | Status: DC
  Administered 2016-07-19 – 2016-07-21 (×3): 80 mg via ORAL
  Filled 2016-07-18 (×3): qty 2

## 2016-07-18 MED ORDER — FUROSEMIDE 40 MG PO TABS
40.0000 mg | ORAL_TABLET | Freq: Every day | ORAL | Status: DC
  Administered 2016-07-19 – 2016-07-21 (×3): 40 mg via ORAL
  Filled 2016-07-18 (×3): qty 1

## 2016-07-18 MED ORDER — ORAL CARE MOUTH RINSE
15.0000 mL | Freq: Two times a day (BID) | OROMUCOSAL | Status: DC
  Administered 2016-07-19 – 2016-07-21 (×4): 15 mL via OROMUCOSAL

## 2016-07-18 NOTE — Progress Notes (Signed)
Patient ID: [name], female   DOB: Jan 14, 1951, 66 y.o.   MRN: [id number]  Pt hypercarbic with respiratory acidosis. will place on BiPAP and transferred to stepdown

## 2016-07-18 NOTE — Telephone Encounter (Signed)
Spoke with patient's sister Verita Lamb).  Patient is having problems with speech and is unable to move leg at all.     EMS is taking her to the ED

## 2016-07-18 NOTE — ED Provider Notes (Signed)
[facility] DEPT Provider Note   CSN: [id number] Arrival date & time: 07/18/16  1135     History   Chief Complaint Chief Complaint  Patient presents with  . Altered Mental Status    HPI [name] is a 66 y.o. female.   Altered Mental Status      Pt was seen at 1210. Per EMS, pt's family and pt: Pt c/o gradual onset and persistence of constant cough for the past 1 week. Pt also c/o LBP for the past 3 months s/p fall. Pt states she laid in her recliner last night to sleep, and had gotten up in the middle of the night to sleep in a chair. Pt states she woke up at 0500 with her right arm and leg "feeling numb." Pt's family states pt "couldn't move her leg." Pt states she took a flexeril this morning for her chronic LBP; LD hydrocodone yesterday. Denies fevers, no rash, no CP/palpitations, no abd pain, no N/V/D, no neck pain, no blurred vision.     Past Medical History:  Diagnosis Date  . Anxiety   . COPD (chronic obstructive pulmonary disease) ([place])   . Depression 2008  . Diabetes mellitus, type 1 1983   type 2 diabetes  . DJD (degenerative joint disease)    of the spine   . GERD (gastroesophageal reflux disease)   . Hyperlipidemia 2007  . Hypertension 1983  . Hypothyroidism 2004   left thyroidectomy  . Insomnia   . Obesity     Patient Active Problem List   Diagnosis Date Noted  . At high risk for injury related to fall 05/29/2016  . Unsteady gait 05/29/2016  . Anemia, deficiency 01/01/2016  . Back spasm 10/01/2015  . DNR no code (do not resuscitate) 06/18/2015  . Excessive daytime sleepiness 12/30/2014  . Abnormal mammogram 12/30/2014  . Auditory hallucinations 12/18/2013  . Chronic respiratory failure with hypoxia ([place]) 12/15/2013  . Hearing loss 109-06-2012  . Oxygen dependent 11/19/2012  . Carotid bruit 04/12/2012  . Uncontrolled diabetes mellitus with diabetic nephropathy ([place]) 08/04/2011  . FATIGUE 02/20/2009  . NICOTINE ADDICTION 12/31/2008  .  ALLERGIC RHINITIS CAUSE UNSPECIFIED 10/07/2008  . Essential hypertension 03/07/2008  . UNSPECIFIED PERIPHERAL VASCULAR DISEASE 03/06/2008  . Hypothyroidism 09/08/2007  . Hyperlipidemia LDL goal <100 09/08/2007  . Morbid obesity ([place]) 09/08/2007  . Anxiety and depression 09/08/2007  . COPD, severe ([place]) 09/08/2007  . GERD 09/08/2007  . Osteoarthritis of spine 09/08/2007  . Insomnia 09/08/2007    Past Surgical History:  Procedure Laterality Date  . APPENDECTOMY    . bilatal great toenail removed , complicated by MRSA in rigt toenail  May 2011  . TONSILLECTOMY  childhood   . TOTAL ABDOMINAL HYSTERECTOMY W/ BILATERAL SALPINGOOPHORECTOMY    . tracheotomy secondary to failed attempt and lung biopsy      OB History    No data available       Home Medications    Prior to Admission medications   Medication Sig Start Date End Date Taking? Authorizing Provider  acetaminophen (TYLENOL) 325 MG tablet Take 650 mg by mouth every 6 (six) hours as needed.    Historical Provider, MD  ALPRAZolam Duanne Moron) 1 MG tablet TAKE ONE TABLET FOUR TIMES A DAY 06/09/16   Cloria Spring, MD  amLODipine (NORVASC) 5 MG tablet take 1 tablet by mouth once daily 05/01/16   Fayrene Helper, MD  aspirin (ASPIRIN LOW DOSE) 81 MG EC tablet Take 81 mg by mouth daily.  Historical Provider, MD  B-D INS SYR ULTRAFINE 1CC/31G 31G X 5/16" 1 ML MISC use as directed FOR ONCE DAILY TESTING 09/02/12   Fayrene Helper, MD  Brexpiprazole (REXULTI) 1 MG TABS Take 1 mg by mouth at bedtime. 12/31/15   Cloria Spring, MD  calcium carbonate (TUMS) 500 MG chewable tablet Chew 1 tablet by mouth as needed.      Historical Provider, MD  cloNIDine (CATAPRES) 0.2 MG tablet take 1 tablet by mouth at bedtime 05/01/16   Fayrene Helper, MD  clotrimazole-betamethasone (LOTRISONE) cream Apply 1 application topically 2 (two) times daily. 01/11/16   Fayrene Helper, MD  cyclobenzaprine (FLEXERIL) 10 MG tablet TAKE 1 TABLET BY MOUTH AT  BEDTIME. 04/04/16   Fayrene Helper, MD  Diphenhyd-Hydrocort-Nystatin (FIRST-DUKES MOUTHWASH) SUSP 10 cc three times daily, gargle, swish and swallow, for 10 days 01/15/16   Fayrene Helper, MD  esomeprazole (NEXIUM) 40 MG capsule take 1 capsule by mouth once daily BEFORE BREAKFAST 07/01/16   Fayrene Helper, MD  FLUoxetine (PROZAC) 20 MG capsule Take two in the am and one in the evening 06/09/16   Cloria Spring, MD  Fluticasone-Salmeterol (ADVAIR DISKUS) 250-50 MCG/DOSE AEPB inhale 1 dose by mouth twice a day 04/09/15   Fayrene Helper, MD  [place] (DIABETIC INSOLES) MISC by Does not apply route. And Shoes    Historical Provider, MD  furosemide (LASIX) 40 MG tablet take 1 tablet by mouth once daily 07/07/16   Fayrene Helper, MD  gabapentin (NEURONTIN) 300 MG capsule Take 1 capsule (300 mg total) by mouth at bedtime. 07/01/16   Fayrene Helper, MD  glipiZIDE (GLUCOTROL XL) 10 MG 24 hr tablet Take 1 tablet (10 mg total) by mouth daily with breakfast. 07/14/16   Fayrene Helper, MD  Glucose Blood (BAYER BREEZE 2 TEST) DISK by In Vitro route. Lancets and strips for four times a day testing     Historical Provider, MD  guaiFENesin 100 MG/5ML SOLN Take 100 mg by mouth 4 (four) times daily. Reported on 06/18/2015    Historical Provider, MD  ipratropium-albuterol (DUONEB) 0.5-2.5 (3) MG/3ML SOLN Take 3 mLs by nebulization 4 (four) times daily as needed.     Historical Provider, MD  levalbuterol [facility] HFA) 45 MCG/ACT inhaler Inhale 1-2 puffs into the lungs as needed.      Historical Provider, MD  levothyroxine (SYNTHROID, LEVOTHROID) 100 MCG tablet take 1 tablet by mouth once daily 06/03/16   Fayrene Helper, MD  lisinopril-hydrochlorothiazide [facility]) 20-12.5 MG tablet take 1 tablet by mouth once daily 05/01/16   Fayrene Helper, MD  meloxicam [facility]) 7.5 MG tablet take 1 tablet once daily 05/19/16   Fayrene Helper, MD  montelukast (SINGULAIR) 10 MG tablet Take  1 tablet (10 mg total) by mouth at bedtime. 12/28/15   Fayrene Helper, MD  nystatin [facility]) powder apply to affected area twice a day if needed 01/11/16   Fayrene Helper, MD  potassium chloride 20 MEQ/15ML (10%) solution TAKE 1 AND 1/2 TEASPOONFUL BY MOUTH ONCE DAILY    Fayrene Helper, MD  rosuvastatin (CRESTOR) 20 MG tablet take 1 tablet by mouth once daily 05/01/16   Fayrene Helper, MD  silver sulfADIAZINE (SILVADENE) 1 % cream Apply 1 application topically daily. 01/09/16   Tanna Furry, MD  Vitamin D, Ergocalciferol, (DRISDOL) 50000 units CAPS capsule take 1 capsule by mouth EVERY 7 DAYS 07/07/16   Fayrene Helper, MD  zolpidem (AMBIEN) 10 MG tablet Take 1 tablet (10 mg total) by mouth at bedtime. 06/09/16   Cloria Spring, MD    Family History Family History  Problem Relation Age of Onset  . Hypertension Mother   . Stroke Mother   . Cancer Father     lung   . Hypertension Sister   . Depression Sister   . Alcohol abuse Sister   . Hypertension Sister   . Depression Sister   . Cancer Sister   . Diabetes Sister   . Depression Sister   . Cancer Sister     renal   . Kidney failure Brother   . Kidney failure      family history   . Hypertension      family history     Social History Social History  Substance Use Topics  . Smoking status: Current Every Day Smoker    Packs/day: 1.00    Types: Cigarettes  . Smokeless tobacco: Never Used  . Alcohol use No     Allergies   Codeine   Review of Systems Review of Systems ROS: Statement: All systems negative except as marked or noted in the HPI; Constitutional: Negative for fever and chills. ; ; Eyes: Negative for eye pain, redness and discharge. ; ; ENMT: Negative for ear pain, hoarseness, nasal congestion, sinus pressure and sore throat. ; ; Cardiovascular: Negative for chest pain, palpitations, diaphoresis, dyspnea and peripheral edema. ; ; Respiratory: +cough. Negative for wheezing and stridor. ; ;  Gastrointestinal: Negative for nausea, vomiting, diarrhea, abdominal pain, blood in stool, hematemesis, jaundice and rectal bleeding. . ; ; Genitourinary: Negative for dysuria, flank pain and hematuria. ; ; Musculoskeletal: Negative for back pain and neck pain. Negative for swelling and trauma.; ; Skin: Negative for pruritus, rash, abrasions, blisters, bruising and skin lesion.; ; Neuro: +extremity weakness, paresthesias. Negative for headache, lightheadedness and neck stiffness. Negative for altered level of consciousness, altered mental status, involuntary movement, seizure and syncope.       Physical Exam Updated Vital Signs BP 120/74   Pulse 101   Temp 98.5 F (36.9 C) (Oral)   Resp 23   Ht '5\' 3"'$  (1.6 m)   Wt 249 lb (112.9 kg)   SpO2 98%   BMI 44.11 kg/m    Patient Vitals for the past 24 hrs:  BP Temp Temp src Pulse Resp SpO2 Height Weight  07/18/16 1250 - - - 97 21 99 % - -  07/18/16 1246 - - - 98 19 100 % - -  07/18/16 1233 - - - - - 98 % - -  07/18/16 1230 145/92 - - 99 (!) 33 100 % - -  07/18/16 1225 - - - - - (!) 85 % - -  07/18/16 1143 120/74 98.5 F (36.9 C) Oral 101 23 90 % - -  07/18/16 1140 - - - - - - '5\' 3"'$  (1.6 m) 249 lb (112.9 kg)     Physical Exam 1215: Physical examination:  Nursing notes reviewed; Vital signs and O2 SAT reviewed;  Constitutional: Well developed, Well nourished, In no acute distress; Head:  Normocephalic, atraumatic; Eyes: EOMI, PERRL, No scleral icterus; ENMT: Mouth and pharynx normal, Mucous membranes dry; Neck: Supple, Full range of motion, No lymphadenopathy; Cardiovascular: Regular rate and rhythm, No gallop; Respiratory: Breath sounds diminished & equal bilaterally, faint wheezes. Speaking sentences, Normal respiratory effort/excursion; Chest: Nontender, Movement normal; Abdomen: Soft, Nontender, Nondistended, Normal bowel sounds; Genitourinary: No CVA tenderness; Extremities: Pulses normal,  No tenderness, No edema, No calf edema or  asymmetry.; Neuro: Lethargic, awakens to name. Major CN grossly intact. No facial droop. Speech slurred. Grips equal. Strength 4/5 equal bilat UE's and LE's. No gross focal deficits in extremities.; Skin: Color normal, Warm, Dry.   ED Treatments / Results  Labs (all labs ordered are listed, but only abnormal results are displayed)   EKG  EKG Interpretation  Date/Time:  Friday July 18 2016 11:53:07 EST Ventricular Rate:  99 PR Interval:    QRS Duration: 89 QT Interval:  350 QTC Calculation: 450 R Axis:   158 Text Interpretation:  Sinus rhythm Low voltage with right axis deviation Abnormal R-wave progression, late transition Baseline wander When compared with ECG of 06/11/2004 No significant change was found Confirmed by [facility]  MD, Nunzio Cory [phone]) on 07/18/2016 12:57:40 PM       Radiology   Procedures Procedures (including critical care time)  Medications Ordered in ED Medications  ipratropium-albuterol (DUONEB) 0.5-2.5 (3) MG/3ML nebulizer solution 3 mL (3 mLs Nebulization Given 07/18/16 1222)  albuterol (PROVENTIL) (2.5 MG/3ML) 0.083% nebulizer solution 2.5 mg (2.5 mg Nebulization Given 07/18/16 1222)  naloxone (NARCAN) injection 0.4 mg (0.4 mg Intravenous Given 07/18/16 1247)  albuterol (PROVENTIL,VENTOLIN) solution continuous neb (10 mg/hr Nebulization Given 07/18/16 1233)  ipratropium (ATROVENT) nebulizer solution 1 mg (1 mg Nebulization Given 07/18/16 1233)     Initial Impression / Assessment and Plan / ED Course  I have reviewed the triage vital signs and the nursing notes.  Pertinent labs & imaging results that were available during my care of the patient were reviewed by me and considered in my medical decision making (see chart for details).  MDM Reviewed: previous chart, nursing note and vitals Reviewed previous: labs and ECG Interpretation: labs, ECG and x-ray   Results for orders placed or performed during the hospital encounter of 07/18/16  CBC with  Differential  Result Value Ref Range   WBC 11.0 (H) 4.0 - 10.5 K/uL   RBC 3.65 (L) 3.87 - 5.11 MIL/uL   Hemoglobin 11.0 (L) 12.0 - 15.0 g/dL   HCT 35.7 (L) 36.0 - 46.0 %   MCV 97.8 78.0 - 100.0 fL   MCH 30.1 26.0 - 34.0 pg   MCHC 30.8 30.0 - 36.0 g/dL   RDW 13.7 11.5 - 15.5 %   Platelets 281 150 - 400 K/uL   Neutrophils Relative % 63 %   Neutro Abs 7.0 1.7 - 7.7 K/uL   Lymphocytes Relative 21 %   Lymphs Abs 2.3 0.7 - 4.0 K/uL   Monocytes Relative 6 %   Monocytes Absolute 0.7 0.1 - 1.0 K/uL   Eosinophils Relative 9 %   Eosinophils Absolute 1.0 (H) 0.0 - 0.7 K/uL   Basophils Relative 1 %   Basophils Absolute 0.1 0.0 - 0.1 K/uL  Comprehensive metabolic panel  Result Value Ref Range   Sodium 141 135 - 145 mmol/L   Potassium 5.5 (H) 3.5 - 5.1 mmol/L   Chloride 104 101 - 111 mmol/L   CO2 30 22 - 32 mmol/L   Glucose, Bld 121 (H) 65 - 99 mg/dL   BUN 39 (H) 6 - 20 mg/dL   Creatinine, Ser 1.35 (H) 0.44 - 1.00 mg/dL   Calcium 9.6 8.9 - 10.3 mg/dL   Total Protein 7.4 6.5 - 8.1 g/dL   Albumin 3.5 3.5 - 5.0 g/dL   AST 17 15 - 41 U/L   ALT 8 (L) 14 - 54 U/L   Alkaline Phosphatase  93 38 - 126 U/L   Total Bilirubin 0.4 0.3 - 1.2 mg/dL   GFR calc non Af Amer 41 (L) >60 mL/min   GFR calc Af Amer 47 (L) >60 mL/min   Anion gap 7 5 - 15  Protime-INR  Result Value Ref Range   Prothrombin Time 13.3 11.4 - 15.2 seconds   INR 1.01   Urinalysis, Routine w reflex microscopic  Result Value Ref Range   Color, Urine STRAW (A) YELLOW   APPearance CLEAR CLEAR   Specific Gravity, Urine 1.011 1.005 - 1.030   pH 5.0 5.0 - 8.0   Glucose, UA NEGATIVE NEGATIVE mg/dL   Hgb urine dipstick NEGATIVE NEGATIVE   Bilirubin Urine NEGATIVE NEGATIVE   Ketones, ur NEGATIVE NEGATIVE mg/dL   Protein, ur NEGATIVE NEGATIVE mg/dL   Nitrite NEGATIVE NEGATIVE   Leukocytes, UA NEGATIVE NEGATIVE  Troponin I  Result Value Ref Range   Troponin I 0.03 (HH) <0.03 ng/mL  Lactic acid, plasma  Result Value Ref Range    Lactic Acid, Venous 1.0 0.5 - 1.9 mmol/L  Lactic acid, plasma  Result Value Ref Range   Lactic Acid, Venous 1.2 0.5 - 1.9 mmol/L  Ethanol  Result Value Ref Range   Alcohol, Ethyl (B) <5 <5 mg/dL  Urine rapid drug screen (hosp performed)  Result Value Ref Range   Opiates POSITIVE (A) NONE DETECTED   Cocaine NONE DETECTED NONE DETECTED   Benzodiazepines POSITIVE (A) NONE DETECTED   Amphetamines NONE DETECTED NONE DETECTED   Tetrahydrocannabinol NONE DETECTED NONE DETECTED   Barbiturates NONE DETECTED NONE DETECTED  Brain natriuretic peptide  Result Value Ref Range   B Natriuretic Peptide 24.0 0.0 - 100.0 pg/mL  CBG monitoring, ED  Result Value Ref Range   Glucose-Capillary 70 65 - 99 mg/dL   Ct Head Wo Contrast Result Date: 07/18/2016 CLINICAL DATA:  Altered level of consciousness and slurred speech today. EXAM: CT HEAD WITHOUT CONTRAST TECHNIQUE: Contiguous axial images were obtained from the base of the skull through the vertex without intravenous contrast. COMPARISON:  Brain MRI 01/06/2013. FINDINGS: Brain: Appears normal without hemorrhage, infarct, mass lesion, mass effect, midline shift or abnormal extra-axial fluid collection. No hydrocephalus or pneumocephalus. Vascular: Atherosclerosis is seen. Skull: Intact. Sinuses/Orbits: Negative. Other: None. IMPRESSION: No acute abnormality. Atherosclerosis. Electronically Signed   By: Inge Rise M.D.   On: 07/18/2016 12:14   Ct Chest Wo Contrast Result Date: 07/18/2016 CLINICAL DATA:  Abnormal chest radiograph. Patient lethargic with generalized weakness. Altered mental status today. EXAM: CT CHEST WITHOUT CONTRAST TECHNIQUE: Multidetector CT imaging of the chest was performed following the standard protocol without IV contrast. COMPARISON:  Current chest radiograph. Prior chest radiograph dated 02/25/2007. FINDINGS: Cardiovascular: Heart is mildly enlarged. There is three-vessel coronary artery disease. Great vessels normal in caliber.  Atherosclerotic calcifications are noted along the thoracic aorta and aortic arch branch vessels. Mediastinum/Nodes: No neck base or axillary masses or adenopathy. Mildly enlarged azygos level right peritracheal lymph node measuring 13 mm in short axis. No discrete hilar masses or enlarged lymph nodes. Generalized prominence of the hilar is likely vascular, with this evaluation limited by lack of intravenous contrast. Lungs/Pleura: Coarse irregular interstitial thickening is noted in a patchy distribution, most prominently in the right upper lobe, but also in the left upper lobe cough anteromedial middle lobe and both lower lobes. There is associated mild bronchiectasis, most notable in the right upper lobe. There is a focal area of opacity in the posterolateral right upper lobe centered  on image 41, series 4, measuring 12 x 9 mm, average 11 mm. A more discoid and patchy opacity in the inferior right upper lobe. These areas are likely atelectasis. No pleural effusion.  No pneumothorax. Upper Abdomen: Small sliding hiatal hernia. Dependent gallstones without acute cholecystitis. No acute findings. Musculoskeletal: No fracture or acute finding. No osteoblastic or osteolytic lesions. IMPRESSION: 1. Findings are consistent with pulmonary fibrosis. This affects both lungs and all lobes, but has an upper lobe predominance. There is associated bronchiectasis. This appears new when compared to the 2008 chest radiograph. 2. No evidence of pulmonary edema. 3. Focal posterior right upper lobe opacity measuring 11 mm in mean transverse dimension. This is likely atelectasis or focal scarring. It may reflect a true nodule. Consider one of the following in 3 months for both low-risk and high-risk individuals: (a) repeat chest CT, (b) follow-up PET-CT, or (c) tissue sampling. This recommendation follows the consensus statement: Guidelines for Management of Incidental Pulmonary Nodules Detected on CT Images: From the Fleischner  Society 2017; Radiology 2017; 284:228-243. 4. Mild cardiomegaly with 3 vessel coronary artery disease. Electronically Signed   By: Lajean Manes M.D.   On: 07/18/2016 14:59   Dg Chest Portable 1 View Result Date: 07/18/2016 CLINICAL DATA:  Pt brought in by EMS after sister called 911 for altered mental status. Pt denies any complaints. Is oriented, but very drowsy. Audible wheezing noted. Hx of COPD, diabetes, hypertension, lung biopsy, current smoker. EXAM: PORTABLE CHEST 1 VIEW COMPARISON:  02/25/2007 FINDINGS: Since prior study, bilateral irregular interstitial thickening has developed, most evident in the right upper lobe. There are no focal areas of lung consolidation. Heart appears mildly enlarged, also new. No mediastinal or hilar masses. No convincing pleural effusion.  No pneumothorax. Skeletal structures are grossly intact. IMPRESSION: 1. New bilateral irregular interstitial thickening, with a heterogeneous distribution, greatest in the right upper lobe. There is associated cardiomegaly, with the cardiac silhouette increase in size from the prior exam. Findings may reflect asymmetric interstitial pulmonary edema. Bilateral interstitial infection or inflammation could have this appearance as could the interval development of interstitial fibrosis. Consider follow-up chest CT for further assessment. 2. No evidence of lobar pneumonia. Electronically Signed   By: Lajean Manes M.D.   On: 07/18/2016 12:17    1215:  Not code stroke or TPA candidate: pt woke up with symptoms (LKW: last seen last night by family), pt without deficits on exam (improving symptoms).  Workup ordered, including dose of IV narcan and continuous neb tx.  1600:  Pt with improved mentation after IV narcan. O2 Sats improving after hour long neb. Will dose IV solumedrol. No change in neuro exam.  T/C to [facility] Dr. Nehemiah Settle, case discussed, including:  HPI, pertinent PM/SHx, VS/PE, dx testing, ED course and treatment:  Agreeable to  admit, requests to obtain MRI/MRA brain.    Final Clinical Impressions(s) / ED Diagnoses   Final diagnoses:  None    New Prescriptions New Prescriptions   No medications on file      Francine Graven, DO 07/21/16 1544

## 2016-07-18 NOTE — ED Triage Notes (Signed)
Pt brought in by EMS after sister called 911 for altered mental status.  Pt denies any complaints.  Is oriented, but very drowsy.  Audible wheezing noted.

## 2016-07-18 NOTE — ED Notes (Signed)
Pt returned from MRI. RT aware of abg

## 2016-07-18 NOTE — Telephone Encounter (Signed)
I recommend limiting bending and lifting to what she is ABLE to do without hurting herself Proper technique in bending Take tylenol 325 mg two twice daily for next 5 days and use topical muscle rub OTC like bengay to sore back

## 2016-07-18 NOTE — Progress Notes (Signed)
Report called to [place], RN on stepdown unit.  Pt transported to stepdown in bed with oxygen in stable condition.

## 2016-07-18 NOTE — ED Notes (Addendum)
CRITICAL VALUE ALERT  Critical value received:  pc02 68.4  Date of notification:  07/18/16  Time of notification:  1846  Critical value read back:Yes.    Nurse who received alert:  c Refoel Palladino rn  MD notified (1st page):  Dr. Nehemiah Settle  Time of first page:  1846  MD notified (2nd page):  Time of second page:  Responding MD:  Dr Nehemiah Settle  Time MD responded: [phone]

## 2016-07-18 NOTE — ED Notes (Signed)
Rt aware of breathing tx

## 2016-07-18 NOTE — H&P (Signed)
History and Physical  [name]:[id number] DOB: 02/22/1951 DOA: 07/18/2016  Referring physician: Dr Thurnell Garbe, ED physician PCP: Tula Nakayama, MD  Outpatient Specialists:   Harrington Challenger (Psych)  Luan Pulling (Pulmonologist)  Patient Coming From: home  Chief Complaint: Weakness, slurred speech  HPI: [name] is a 66 y.o. female with a history of COPD with chronic respiratory failure on 4 L of oxygen nasal cannula at home, uncontrolled type 2 diabetes, eating disorder of mixed type, GERD, hypertension, hypothyroidism, hyperlipidemia. Patient presents with slurred speech and weakness that started this morning. Patient is chronic back pain and woke up in the middle the night and was inset in her recliner chair because of her pain. This morning when she awoke, she had difficulty moving her right leg. Family members also noted that her speech was slurred and she was somnolent and had generalized weakness. She was brought in by EMS - she was oriented but very drowsy with audible wheezing. She was immediately placed on nasal cannula with improvement of her oxygen saturation, however she continued to be fairly drowsy. The patient received Narcan and had some improvement of her somnolence. She continued to be globally weak with no other palliating or provoking factors.  Emergency Department Course: Patient received an hour-long nebulizer treatment and steroids. CT of the head was normal. Chest x-ray showed interstitial thickening with possible pulmonary edema, however CT showed pulmonary fibrosis with a focal posterior right upper lobe opacity about 1.1 cm in dimension. Troponin was slightly elevated at 0.03.   Review of Systems:     Pt denies any fevers, chills, nausea, vomiting, diarrhea, constipation, abdominal pain, shortness of breath, dyspnea on exertion, orthopnea, cough, wheezing, palpitations, headache, vision changes, lightheadedness, dizziness, melena, rectal bleeding.  Review of  systems are otherwise negative  Past Medical History:  Diagnosis Date  . Anxiety   . COPD (chronic obstructive pulmonary disease) ([place])   . Depression 2008  . Diabetes mellitus, type 1 1983   type 2 diabetes  . DJD (degenerative joint disease)    of the spine   . GERD (gastroesophageal reflux disease)   . Hyperlipidemia 2007  . Hypertension 1983  . Hypothyroidism 2004   left thyroidectomy  . Insomnia   . Obesity    Past Surgical History:  Procedure Laterality Date  . APPENDECTOMY    . bilatal great toenail removed , complicated by MRSA in rigt toenail  May 2011  . TONSILLECTOMY  childhood   . TOTAL ABDOMINAL HYSTERECTOMY W/ BILATERAL SALPINGOOPHORECTOMY    . tracheotomy secondary to failed attempt and lung biopsy     Social History:  reports that she has been smoking Cigarettes.  She has been smoking about 1.00 pack per day. She has never used smokeless tobacco. She reports that she does not drink alcohol or use drugs. Patient lives at home  Allergies  Allergen Reactions  . Codeine     REACTION: nausea    Family History  Problem Relation Age of Onset  . Hypertension Mother   . Stroke Mother   . Cancer Father     lung   . Hypertension Sister   . Depression Sister   . Alcohol abuse Sister   . Hypertension Sister   . Depression Sister   . Cancer Sister   . Diabetes Sister   . Depression Sister   . Cancer Sister     renal   . Kidney failure Brother   . Kidney failure      family history   .  Hypertension      family history       Prior to Admission medications   Medication Sig Start Date End Date Taking? Authorizing Provider  acetaminophen (TYLENOL) 325 MG tablet Take 650 mg by mouth every 6 (six) hours as needed.   Yes Historical Provider, MD  ALPRAZolam Duanne Moron) 1 MG tablet TAKE ONE TABLET FOUR TIMES A DAY 06/09/16  Yes Cloria Spring, MD  amLODipine (NORVASC) 5 MG tablet take 1 tablet by mouth once daily 05/01/16  Yes Fayrene Helper, MD  aspirin (ASPIRIN  LOW DOSE) 81 MG EC tablet Take 81 mg by mouth daily.     Yes Historical Provider, MD  calcium carbonate (TUMS) 500 MG chewable tablet Chew 1 tablet by mouth as needed.     Yes Historical Provider, MD  cloNIDine (CATAPRES) 0.2 MG tablet take 1 tablet by mouth at bedtime 05/01/16  Yes Fayrene Helper, MD  cyclobenzaprine (FLEXERIL) 10 MG tablet TAKE 1 TABLET BY MOUTH AT BEDTIME. 04/04/16  Yes Fayrene Helper, MD  esomeprazole (NEXIUM) 40 MG capsule take 1 capsule by mouth once daily BEFORE BREAKFAST 07/01/16  Yes Fayrene Helper, MD  ferrous sulfate 325 (65 FE) MG tablet Take 325 mg by mouth daily with breakfast.   Yes Historical Provider, MD  FLUoxetine (PROZAC) 20 MG capsule Take two in the am and one in the evening Patient taking differently: Take 20-40 mg by mouth 2 (two) times daily. Take two capsules in the am and one capsule in the evening 06/09/16  Yes Cloria Spring, MD  Fluticasone-Salmeterol (ADVAIR DISKUS) 250-50 MCG/DOSE AEPB inhale 1 dose by mouth twice a day 04/09/15  Yes Fayrene Helper, MD  folic acid (FOLVITE) 619 MCG tablet Take 400 mcg by mouth daily.   Yes Historical Provider, MD  furosemide (LASIX) 40 MG tablet take 1 tablet by mouth once daily 07/07/16  Yes Fayrene Helper, MD  gabapentin (NEURONTIN) 300 MG capsule Take 1 capsule (300 mg total) by mouth at bedtime. 07/01/16  Yes Fayrene Helper, MD  glipiZIDE (GLUCOTROL XL) 5 MG 24 hr tablet Take 5 mg by mouth daily with breakfast.   Yes Historical Provider, MD  guaiFENesin 100 MG/5ML SOLN Take 100 mg by mouth 4 (four) times daily. Reported on 06/18/2015   Yes Historical Provider, MD  insulin glargine (LANTUS) 100 UNIT/ML injection Inject 50 Units into the skin daily.   Yes Historical Provider, MD  ipratropium-albuterol (DUONEB) 0.5-2.5 (3) MG/3ML SOLN Take 3 mLs by nebulization 4 (four) times daily as needed.    Yes Historical Provider, MD  levalbuterol [facility] HFA) 45 MCG/ACT inhaler Inhale 1-2 puffs into the lungs as  needed.     Yes Historical Provider, MD  levothyroxine (SYNTHROID, LEVOTHROID) 100 MCG tablet take 1 tablet by mouth once daily 06/03/16  Yes Fayrene Helper, MD  lisinopril-hydrochlorothiazide [facility]) 20-12.5 MG tablet take 1 tablet by mouth once daily 05/01/16  Yes Fayrene Helper, MD  montelukast (SINGULAIR) 10 MG tablet Take 1 tablet (10 mg total) by mouth at bedtime. 12/28/15  Yes Fayrene Helper, MD  nystatin [facility]) powder apply to affected area twice a day if needed 01/11/16  Yes Fayrene Helper, MD  potassium chloride 20 MEQ/15ML (10%) solution TAKE 1 AND 1/2 TEASPOONFUL BY MOUTH ONCE DAILY   Yes Fayrene Helper, MD  rosuvastatin (CRESTOR) 20 MG tablet take 1 tablet by mouth once daily 05/01/16  Yes Fayrene Helper, MD  Vitamin D, Ergocalciferol, (  DRISDOL) 50000 units CAPS capsule take 1 capsule by mouth EVERY 7 DAYS 07/07/16  Yes Fayrene Helper, MD  zolpidem (AMBIEN) 10 MG tablet Take 1 tablet (10 mg total) by mouth at bedtime. 06/09/16  Yes Cloria Spring, MD  B-D INS SYR ULTRAFINE 1CC/31G 31G X 5/16" 1 ML MISC use as directed FOR ONCE DAILY TESTING 09/02/12   Fayrene Helper, MD  Foot Care Products (DIABETIC INSOLES) MISC by Does not apply route. And Shoes    Historical Provider, MD  glipiZIDE (GLUCOTROL XL) 10 MG 24 hr tablet Take 1 tablet (10 mg total) by mouth daily with breakfast. Patient not taking: Reported on 07/18/2016 07/14/16   Fayrene Helper, MD  Glucose Blood (BAYER BREEZE 2 TEST) DISK by In Vitro route. Lancets and strips for four times a day testing     Historical Provider, MD  silver sulfADIAZINE (SILVADENE) 1 % cream Apply 1 application topically daily. Patient taking differently: Apply 1 application topically daily as needed.  01/09/16   Tanna Furry, MD    Physical Exam: BP 132/86   Pulse 94   Temp 98.9 F (37.2 C) (Rectal)   Resp 18   Ht '5\' 3"'$  (1.6 m)   Wt 112.9 kg (249 lb)   SpO2 99%   BMI 44.11 kg/m   General: Elderly black  female. Awake and alert and oriented x3. No acute cardiopulmonary distress.  HEENT: Normocephalic atraumatic.  Right and left ears normal in appearance.  Pupils equal, round, reactive to light. Extraocular muscles are intact. Sclerae anicteric and noninjected.  Moist mucosal membranes. No mucosal lesions.  Neck: Neck supple without lymphadenopathy. No carotid bruits. No masses palpated.  Cardiovascular: Regular rate with normal S1-S2 sounds. No murmurs, rubs, gallops auscultated. No JVD.  Respiratory: Diffuse wheezing noted. No use of accessory muscles. No rales. Abdomen: Soft, nontender, nondistended. Active bowel sounds. No masses or hepatosplenomegaly  Skin: No rashes, lesions, or ulcerations.  Dry, warm to touch. 2+ dorsalis pedis and radial pulses. Musculoskeletal: No calf or leg pain. All major joints not erythematous nontender.  No upper or lower joint deformation.  Good ROM.  No contractures  Psychiatric: Intact judgment and insight. Pleasant and cooperative. Neurologic: There are no focal deficits observed. Strength is 5 out of 5 and symmetric, however all of her movements are very slowed, including finger to nose and heel to shin. She is able to perform these status, but she has difficulty with getting food on her fork. Additionally, when observing her trying to eat, her fork got caught on the lip of her start from tray and had difficulty lifting her fork above the lip. There is no asterixis or ataxia. Cranial nerves II through XII are grossly intact.           Labs on Admission: I have personally reviewed following labs and imaging studies  CBC:  Recent Labs Lab 07/18/16 1225  WBC 11.0*  NEUTROABS 7.0  HGB 11.0*  HCT 35.7*  MCV 97.8  PLT 941   Basic Metabolic Panel:  Recent Labs Lab 07/18/16 1225  NA 141  K 5.5*  CL 104  CO2 30  GLUCOSE 121*  BUN 39*  CREATININE 1.35*  CALCIUM 9.6   GFR: Estimated Creatinine Clearance: 50.9 mL/min (by C-G formula based on SCr of  1.35 mg/dL (H)). Liver Function Tests:  Recent Labs Lab 07/18/16 1225  AST 17  ALT 8*  ALKPHOS 93  BILITOT 0.4  PROT 7.4  ALBUMIN 3.5   No  results for input(s): LIPASE, AMYLASE in the last 168 hours. No results for input(s): AMMONIA in the last 168 hours. Coagulation Profile:  Recent Labs Lab 07/18/16 1225  INR 1.01   Cardiac Enzymes:  Recent Labs Lab 07/18/16 1225  TROPONINI 0.03*   BNP (last 3 results) No results for input(s): PROBNP in the last 8760 hours. HbA1C: No results for input(s): HGBA1C in the last 72 hours. CBG:  Recent Labs Lab 07/18/16 1616  GLUCAP 70   Lipid Profile: No results for input(s): CHOL, HDL, LDLCALC, TRIG, CHOLHDL, LDLDIRECT in the last 72 hours. Thyroid Function Tests: No results for input(s): TSH, T4TOTAL, FREET4, T3FREE, THYROIDAB in the last 72 hours. Anemia Panel: No results for input(s): VITAMINB12, FOLATE, FERRITIN, TIBC, IRON, RETICCTPCT in the last 72 hours. Urine analysis:    Component Value Date/Time   COLORURINE STRAW (A) 07/18/2016 1151   APPEARANCEUR CLEAR 07/18/2016 1151   LABSPEC 1.011 07/18/2016 1151   PHURINE 5.0 07/18/2016 1151   GLUCOSEU NEGATIVE 07/18/2016 1151   HGBUR NEGATIVE 07/18/2016 1151   BILIRUBINUR NEGATIVE 07/18/2016 1151   KETONESUR NEGATIVE 07/18/2016 1151   PROTEINUR NEGATIVE 07/18/2016 1151   NITRITE NEGATIVE 07/18/2016 1151   LEUKOCYTESUR NEGATIVE 07/18/2016 1151   Sepsis Labs: '@LABRCNTIP'$ (procalcitonin:4,lacticidven:4) )No results found for this or any previous visit (from the past 240 hour(s)).   Radiological Exams on Admission: Ct Head Wo Contrast  Result Date: 07/18/2016 CLINICAL DATA:  Altered level of consciousness and slurred speech today. EXAM: CT HEAD WITHOUT CONTRAST TECHNIQUE: Contiguous axial images were obtained from the base of the skull through the vertex without intravenous contrast. COMPARISON:  Brain MRI 01/06/2013. FINDINGS: Brain: Appears normal without hemorrhage,  infarct, mass lesion, mass effect, midline shift or abnormal extra-axial fluid collection. No hydrocephalus or pneumocephalus. Vascular: Atherosclerosis is seen. Skull: Intact. Sinuses/Orbits: Negative. Other: None. IMPRESSION: No acute abnormality. Atherosclerosis. Electronically Signed   By: Inge Rise M.D.   On: 07/18/2016 12:14   Ct Chest Wo Contrast  Result Date: 07/18/2016 CLINICAL DATA:  Abnormal chest radiograph. Patient lethargic with generalized weakness. Altered mental status today. EXAM: CT CHEST WITHOUT CONTRAST TECHNIQUE: Multidetector CT imaging of the chest was performed following the standard protocol without IV contrast. COMPARISON:  Current chest radiograph. Prior chest radiograph dated 02/25/2007. FINDINGS: Cardiovascular: Heart is mildly enlarged. There is three-vessel coronary artery disease. Great vessels normal in caliber. Atherosclerotic calcifications are noted along the thoracic aorta and aortic arch branch vessels. Mediastinum/Nodes: No neck base or axillary masses or adenopathy. Mildly enlarged azygos level right peritracheal lymph node measuring 13 mm in short axis. No discrete hilar masses or enlarged lymph nodes. Generalized prominence of the hilar is likely vascular, with this evaluation limited by lack of intravenous contrast. Lungs/Pleura: Coarse irregular interstitial thickening is noted in a patchy distribution, most prominently in the right upper lobe, but also in the left upper lobe cough anteromedial middle lobe and both lower lobes. There is associated mild bronchiectasis, most notable in the right upper lobe. There is a focal area of opacity in the posterolateral right upper lobe centered on image 41, series 4, measuring 12 x 9 mm, average 11 mm. A more discoid and patchy opacity in the inferior right upper lobe. These areas are likely atelectasis. No pleural effusion.  No pneumothorax. Upper Abdomen: Small sliding hiatal hernia. Dependent gallstones without acute  cholecystitis. No acute findings. Musculoskeletal: No fracture or acute finding. No osteoblastic or osteolytic lesions. IMPRESSION: 1. Findings are consistent with pulmonary fibrosis. This affects both lungs  and all lobes, but has an upper lobe predominance. There is associated bronchiectasis. This appears new when compared to the 2008 chest radiograph. 2. No evidence of pulmonary edema. 3. Focal posterior right upper lobe opacity measuring 11 mm in mean transverse dimension. This is likely atelectasis or focal scarring. It may reflect a true nodule. Consider one of the following in 3 months for both low-risk and high-risk individuals: (a) repeat chest CT, (b) follow-up PET-CT, or (c) tissue sampling. This recommendation follows the consensus statement: Guidelines for Management of Incidental Pulmonary Nodules Detected on CT Images: From the Fleischner Society 2017; Radiology 2017; 284:228-243. 4. Mild cardiomegaly with 3 vessel coronary artery disease. Electronically Signed   By: Lajean Manes M.D.   On: 07/18/2016 14:59   Dg Chest Portable 1 View  Result Date: 07/18/2016 CLINICAL DATA:  Pt brought in by EMS after sister called 911 for altered mental status. Pt denies any complaints. Is oriented, but very drowsy. Audible wheezing noted. Hx of COPD, diabetes, hypertension, lung biopsy, current smoker. EXAM: PORTABLE CHEST 1 VIEW COMPARISON:  02/25/2007 FINDINGS: Since prior study, bilateral irregular interstitial thickening has developed, most evident in the right upper lobe. There are no focal areas of lung consolidation. Heart appears mildly enlarged, also new. No mediastinal or hilar masses. No convincing pleural effusion.  No pneumothorax. Skeletal structures are grossly intact. IMPRESSION: 1. New bilateral irregular interstitial thickening, with a heterogeneous distribution, greatest in the right upper lobe. There is associated cardiomegaly, with the cardiac silhouette increase in size from the prior exam.  Findings may reflect asymmetric interstitial pulmonary edema. Bilateral interstitial infection or inflammation could have this appearance as could the interval development of interstitial fibrosis. Consider follow-up chest CT for further assessment. 2. No evidence of lobar pneumonia. Electronically Signed   By: Lajean Manes M.D.   On: 07/18/2016 12:17    EKG: Independently reviewed. Sinus rhythm with late R-wave progression. Low voltage. No acute ST changes.  Assessment/Plan: Principal Problem:   Acute respiratory failure with hypoxia (HCC) Active Problems:   Hyperkalemia   Essential hypertension   GERD   Uncontrolled diabetes mellitus with diabetic nephropathy (HCC)   COPD with acute exacerbation (HCC)   Weakness generalized   Elevated troponin    This patient was discussed with the ED physician, including pertinent vitals, physical exam findings, labs, and imaging.  We also discussed care given by the ED provider.  #1 acute respiratory failure with hypoxia  Admit with telemetry  Continue oxygen via nasal cannula  ABG #2 COPD exacerbation  Antibiotics: None as no sputum production  Albuterol neb every 6 scheduled with albuterol every 2 when necessary  Spiriva  Continue inhaled steroids and LA bronchodilator  Solu-Medrol 60 mg IV every 6 hours  Mucinex #3 generalized weakness  Rule out TIA versus stroke  Will obtain MRI/MRA  Echocardiogram  Carotid ultrasounds  TSH in the morning  PT/OT/speech therapy  Lipid panel, hemoglobin A1c in the morning 4 elevated troponin  We'll obtain one troponin as this is unlikely ACS 5 hyperkalemia  Repeat potassium in the morning  Hold potassium replacement #6 essential hypertension  Continue medications #7 GERD  Continue PPI #8 uncontrolled diabetes  Hold glyburide  Lantus 20 units at bedtime  4 units with meals with sliding scale  DVT prophylaxis: Lovenox Consultants: None Code Status: DO NOT  RESUSCITATE Family Communication: Sister in the room  Disposition Plan: Pending   Truett Mainland, DO [facility] Pager [phone]  If 7PM-7AM, please contact  night-coverage [email or web address] Password [email or web address]

## 2016-07-18 NOTE — ED Notes (Signed)
edp aware of pt

## 2016-07-18 NOTE — ED Notes (Signed)
CRITICAL VALUE ALERT  Critical value received:  Troponin 0.03  Date of notification:  07/18/16  Time of notification:  1338  Critical value read back:Yes.    Nurse who received alert:  Laurell Josephs RN  MD notified (1st page):  Thurnell Garbe  Time of first page:  1338  MD notified (2nd page):  Time of second page:  Responding MD:  Thurnell Garbe  Time MD responded:  [phone]

## 2016-07-18 NOTE — ED Notes (Signed)
Patient transported to CT 

## 2016-07-18 NOTE — ED Notes (Signed)
Just returned with pt from ct/xray. No changes. Family at bedside and updated.

## 2016-07-18 NOTE — ED Notes (Signed)
Pt taken to MRI  

## 2016-07-19 ENCOUNTER — Inpatient Hospital Stay (HOSPITAL_COMMUNITY): Payer: Medicare Other

## 2016-07-19 DIAGNOSIS — J9601 Acute respiratory failure with hypoxia: Secondary | ICD-10-CM

## 2016-07-19 DIAGNOSIS — I10 Essential (primary) hypertension: Secondary | ICD-10-CM

## 2016-07-19 DIAGNOSIS — J441 Chronic obstructive pulmonary disease with (acute) exacerbation: Secondary | ICD-10-CM

## 2016-07-19 LAB — TSH: TSH: 0.667 u[IU]/mL (ref 0.350–4.500)

## 2016-07-19 LAB — BASIC METABOLIC PANEL
Anion gap: 6 (ref 5–15)
BUN: 37 mg/dL — AB (ref 6–20)
CALCIUM: 9.1 mg/dL (ref 8.9–10.3)
CO2: 28 mmol/L (ref 22–32)
CREATININE: 1.07 mg/dL — AB (ref 0.44–1.00)
Chloride: 102 mmol/L (ref 101–111)
GFR calc Af Amer: >60 mL/min (ref >60)
GFR, EST NON AFRICAN AMERICAN: 54 mL/min — AB (ref >60)
Glucose, Bld: 294 mg/dL — ABNORMAL HIGH (ref 65–99)
Potassium: 5.4 mmol/L — ABNORMAL HIGH (ref 3.5–5.1)
Sodium: 136 mmol/L (ref 135–145)

## 2016-07-19 LAB — GLUCOSE, CAPILLARY
GLUCOSE-CAPILLARY: 282 mg/dL — AB (ref 65–99)
Glucose-Capillary: 262 mg/dL — ABNORMAL HIGH (ref 65–99)
Glucose-Capillary: 289 mg/dL — ABNORMAL HIGH (ref 65–99)
Glucose-Capillary: 162 mg/dL — ABNORMAL HIGH (ref 65–99)

## 2016-07-19 LAB — LIPID PANEL
CHOL/HDL RATIO: 2.3 ratio
CHOLESTEROL: 148 mg/dL (ref 0–200)
HDL: 63 mg/dL (ref >40)
LDL CALC: 78 mg/dL (ref 0–99)
TRIGLYCERIDES: 36 mg/dL (ref <150)
VLDL: 7 mg/dL (ref 0–40)

## 2016-07-19 LAB — CBC
HCT: 34.8 % — ABNORMAL LOW (ref 36.0–46.0)
Hemoglobin: 11.2 g/dL — ABNORMAL LOW (ref 12.0–15.0)
MCH: 30.7 pg (ref 26.0–34.0)
MCHC: 32.2 g/dL (ref 30.0–36.0)
MCV: 95.3 fL (ref 78.0–100.0)
PLATELETS: 271 10*3/uL (ref 150–400)
RBC: 3.65 MIL/uL — ABNORMAL LOW (ref 3.87–5.11)
RDW: 13.2 % (ref 11.5–15.5)
WBC: 10.9 10*3/uL — ABNORMAL HIGH (ref 4.0–10.5)

## 2016-07-19 LAB — MRSA PCR SCREENING: MRSA BY PCR: NEGATIVE

## 2016-07-19 NOTE — Progress Notes (Signed)
Patient wanted to be off BiPAP for a period placed on 4 lpm nasal cannula.

## 2016-07-19 NOTE — Progress Notes (Signed)
SLP Cancellation Note  Patient Details Name: [name] MRN: [id number] DOB: [birth year]   Cancelled treatment:       Reason Eval/Treat Not Completed: Other (comment) (D/W Dr. Marin Comment who said to D/C order for SLE); Dr. Marin Comment reports no SLP needs at this time. Please re-consult if indicated.  Thank you,  Genene Churn, [place]    [place] 07/19/2016, 9:58 AM

## 2016-07-19 NOTE — Progress Notes (Signed)
PROGRESS NOTE    [name]  RKY:[id number] DOB: 01-Feb-1951 DOA: 07/18/2016 PCP: Tula Nakayama, MD    Brief Narrative: Patient is a 66 yo patient with hx of COPD on home oxygen, active smoker, DM, GERD, HTN, hypothyroidism, admitted for hypoxic respiratory failure, slurred speech, and was thought to have COPD exacerbation in addition to having accidental narcotic overdose.  She is better this am and has no neurological symptoms.  Her MRI.MRA showed no acute CVA, but it showed a small aneurysm, and high grade paraclinoid segment of the right ICA.  I spoke with neurologist Dr Rosie Fate and he recommended follow up outpatient neurology, but nothing urgent needs to be address at this time.  She was give IV Steroid and was treated for her COPD exacerbation.     Assessment & Plan:   Principal Problem:   Acute respiratory failure with hypoxia (HCC) Active Problems:   Hyperkalemia   Essential hypertension   GERD   Uncontrolled diabetes mellitus with diabetic nephropathy (HCC)   COPD with acute exacerbation (HCC)   Weakness generalized   Elevated troponin   1. COPD exacerbation:  Continue with steroid, nebs, and transfer to telemetry today. Must stop cigarettes.  2. Chronic pain syndrome: Warned about narcotic use and respiratory arrest.   3. Carotid Stenosis and aneurysm:  Will need to follow up with neurology outpatient.  Continue with ASA and Statin. 4. DM:  Stable. 5. HTN:  Continue with meds.   DVT prophylaxis: SUB Q Heparin.  Code Status: FULL CODE.  Family Communication: sister and nephew. Disposition Plan: Home when better.   Consultants:   Phone consultation with neurology.   Procedures:   None.   Antimicrobials: Anti-infectives    None       Subjective: Feeling better. Wanted to go home.   Objective: Vitals:   07/19/16 0600 07/19/16 0709 07/19/16 0727 07/19/16 0732  BP: 127/71     Pulse: 92 92    Resp: (!) 24     Temp:  97.7 F (36.5 C)      TempSrc:  Oral    SpO2: 96% 91% 93% 93%  Weight:      Height:        Intake/Output Summary (Last 24 hours) at 07/19/16 1019 Last data filed at 07/19/16 0200  Gross per 24 hour  Intake              100 ml  Output             1250 ml  Net            -1150 ml   Filed Weights   07/18/16 1140 07/18/16 2000 07/19/16 0400  Weight: 112.9 kg (249 lb) 113.7 kg (250 lb 10.6 oz) 113.9 kg (251 lb 1.7 oz)    Examination:  General exam: Appears calm and comfortable  Respiratory system: Clear to auscultation. Respiratory effort normal. Cardiovascular system: S1 & S2 heard, RRR. No JVD, murmurs, rubs, gallops or clicks. No pedal edema. Gastrointestinal system: Abdomen is nondistended, soft and nontender. No organomegaly or masses felt. Normal bowel sounds heard. Central nervous system: Alert and oriented. No focal neurological deficits. Extremities: Symmetric 5 x 5 power. Skin: No rashes, lesions or ulcers Psychiatry: Judgement and insight appear normal. Mood & affect appropriate.   Data Reviewed: I have personally reviewed following labs and imaging studies  CBC:  Recent Labs Lab 07/18/16 1225 07/19/16 0520  WBC 11.0* 10.9*  NEUTROABS 7.0  --   HGB 11.0* 11.2*  HCT 35.7* 34.8*  MCV 97.8 95.3  PLT 281 350   Basic Metabolic Panel:  Recent Labs Lab 07/18/16 1225 07/19/16 0520  NA 141 136  K 5.5* 5.4*  CL 104 102  CO2 30 28  GLUCOSE 121* 294*  BUN 39* 37*  CREATININE 1.35* 1.07*  CALCIUM 9.6 9.1   GFR: Estimated Creatinine Clearance: 64.6 mL/min (by C-G formula based on SCr of 1.07 mg/dL (H)). Liver Function Tests:  Recent Labs Lab 07/18/16 1225  AST 17  ALT 8*  ALKPHOS 93  BILITOT 0.4  PROT 7.4  ALBUMIN 3.5   Coagulation Profile:  Recent Labs Lab 07/18/16 1225  INR 1.01   Cardiac Enzymes:  Recent Labs Lab 07/18/16 1225 07/18/16 1756  TROPONINI 0.03* <0.03   CBG:  Recent Labs Lab 07/18/16 1616 07/18/16 2133 07/19/16 0708  GLUCAP 70 153*  262*   Lipid Profile:  Recent Labs  07/19/16 0520  CHOL 148  HDL 63  LDLCALC 78  TRIG 36  CHOLHDL 2.3   Thyroid Function Tests:  Recent Labs  07/19/16 0520  TSH 0.667   Sepsis Labs:  Recent Labs Lab 07/18/16 1225 07/18/16 1519  LATICACIDVEN 1.0 1.2    Radiology Studies: Ct Head Wo Contrast  Result Date: 07/18/2016 CLINICAL DATA:  Altered level of consciousness and slurred speech today. EXAM: CT HEAD WITHOUT CONTRAST TECHNIQUE: Contiguous axial images were obtained from the base of the skull through the vertex without intravenous contrast. COMPARISON:  Brain MRI 01/06/2013. FINDINGS: Brain: Appears normal without hemorrhage, infarct, mass lesion, mass effect, midline shift or abnormal extra-axial fluid collection. No hydrocephalus or pneumocephalus. Vascular: Atherosclerosis is seen. Skull: Intact. Sinuses/Orbits: Negative. Other: None. IMPRESSION: No acute abnormality. Atherosclerosis. Electronically Signed   By: Inge Rise M.D.   On: 07/18/2016 12:14   Ct Chest Wo Contrast  Result Date: 07/18/2016 CLINICAL DATA:  Abnormal chest radiograph. Patient lethargic with generalized weakness. Altered mental status today. EXAM: CT CHEST WITHOUT CONTRAST TECHNIQUE: Multidetector CT imaging of the chest was performed following the standard protocol without IV contrast. COMPARISON:  Current chest radiograph. Prior chest radiograph dated 02/25/2007. FINDINGS: Cardiovascular: Heart is mildly enlarged. There is three-vessel coronary artery disease. Great vessels normal in caliber. Atherosclerotic calcifications are noted along the thoracic aorta and aortic arch branch vessels. Mediastinum/Nodes: No neck base or axillary masses or adenopathy. Mildly enlarged azygos level right peritracheal lymph node measuring 13 mm in short axis. No discrete hilar masses or enlarged lymph nodes. Generalized prominence of the hilar is likely vascular, with this evaluation limited by lack of intravenous  contrast. Lungs/Pleura: Coarse irregular interstitial thickening is noted in a patchy distribution, most prominently in the right upper lobe, but also in the left upper lobe cough anteromedial middle lobe and both lower lobes. There is associated mild bronchiectasis, most notable in the right upper lobe. There is a focal area of opacity in the posterolateral right upper lobe centered on image 41, series 4, measuring 12 x 9 mm, average 11 mm. A more discoid and patchy opacity in the inferior right upper lobe. These areas are likely atelectasis. No pleural effusion.  No pneumothorax. Upper Abdomen: Small sliding hiatal hernia. Dependent gallstones without acute cholecystitis. No acute findings. Musculoskeletal: No fracture or acute finding. No osteoblastic or osteolytic lesions. IMPRESSION: 1. Findings are consistent with pulmonary fibrosis. This affects both lungs and all lobes, but has an upper lobe predominance. There is associated bronchiectasis. This appears new when compared to the 2008 chest radiograph. 2. No evidence  of pulmonary edema. 3. Focal posterior right upper lobe opacity measuring 11 mm in mean transverse dimension. This is likely atelectasis or focal scarring. It may reflect a true nodule. Consider one of the following in 3 months for both low-risk and high-risk individuals: (a) repeat chest CT, (b) follow-up PET-CT, or (c) tissue sampling. This recommendation follows the consensus statement: Guidelines for Management of Incidental Pulmonary Nodules Detected on CT Images: From the Fleischner Society 2017; Radiology 2017; 284:228-243. 4. Mild cardiomegaly with 3 vessel coronary artery disease. Electronically Signed   By: Lajean Manes M.D.   On: 07/18/2016 14:59   Mr Brain Wo Contrast (neuro Protocol)  Result Date: 07/18/2016 CLINICAL DATA:  66 y/o F; altered mental status. Right-sided weakness. EXAM: MRI HEAD WITHOUT CONTRAST MRA HEAD WITHOUT CONTRAST TECHNIQUE: Multiplanar, multiecho pulse  sequences of the brain and surrounding structures were obtained without intravenous contrast. Angiographic images of the head were obtained using MRA technique without contrast. COMPARISON:  07/18/2016 CT head.  01/06/2013 MRI of the brain. FINDINGS: MRI HEAD FINDINGS Brain: No acute infarction, hemorrhage, hydrocephalus, extra-axial collection or mass lesion. Stable T2 FLAIR hyperintense signal abnormality in subcortical and periventricular white matter probably representing chronic microvascular ischemic changes. Stable mild brain parenchymal volume loss. Vascular: As below. Skull and upper cervical spine: Normal marrow signal. Sinuses/Orbits: Stable partial opacification of right mastoid tip. Otherwise visualized paranasal sinuses and mastoid air cells demonstrate normal signal. Orbits are unremarkable. Other: T2 hyperintense and T1 isointense lesion centered in the right maxillary alveolar bone adjacent to incisor and premolars measuring 22 x 17 x 17 mm (AP x ML x CC series 7, image 3 and series 2, image 8). MRA HEAD FINDINGS Moderate motion artifact, suboptimal evaluation for subtle stenosis or aneurysm. Internal carotid arteries: Patent. Poor flow related signal within the right paraclinoid segment probably representing high-grade underlying stenosis. 4 mm laterally directed aneurysm arising from the left distal cavernous ICA segment. Anterior cerebral arteries:  Patent. Middle cerebral arteries: Patent. The M1 segment lumen irregularity and stenosis of right M2 inferior division. Anterior communicating artery: Patent. Posterior communicating arteries: Patent. Left-greater-than-right P1 segment irregularity with probable areas of stenosis on the left. Posterior cerebral arteries:  Patent. Basilar artery: Patent. Short segment of mild stenosis of the midbasilar artery. Vertebral arteries:  Patent. IMPRESSION: 1. No acute intracranial abnormality. 2. Stable mild chronic microvascular ischemic changes and  parenchymal volume loss of the brain. 3. New T2 hyperintense lesion of uncertain significance centered within the right maxillary alveolar bone measuring up to 22 mm adjacent to several teeth. Further characterization with facial CT is recommended on a nonemergent basis. 4. Motion degraded MRA of the head.  No large vessel occlusion. 5. Poor flow related signal in the right ICA paraclinoid segment probably represents underlying high-grade stenosis. 6. Left distal cavernous ICA 4 mm laterally directed aneurysm. 7. Lumen irregularity with areas of mild stenosis of the anterior and posterior circulation, suboptimally assessed due to motion artifact, probably representing intracranial atherosclerosis. Electronically Signed   By: Kristine Garbe M.D.   On: 07/18/2016 18:44   Dg Chest Portable 1 View  Result Date: 07/18/2016 CLINICAL DATA:  Pt brought in by EMS after sister called 911 for altered mental status. Pt denies any complaints. Is oriented, but very drowsy. Audible wheezing noted. Hx of COPD, diabetes, hypertension, lung biopsy, current smoker. EXAM: PORTABLE CHEST 1 VIEW COMPARISON:  02/25/2007 FINDINGS: Since prior study, bilateral irregular interstitial thickening has developed, most evident in the right upper lobe. There are  no focal areas of lung consolidation. Heart appears mildly enlarged, also new. No mediastinal or hilar masses. No convincing pleural effusion.  No pneumothorax. Skeletal structures are grossly intact. IMPRESSION: 1. New bilateral irregular interstitial thickening, with a heterogeneous distribution, greatest in the right upper lobe. There is associated cardiomegaly, with the cardiac silhouette increase in size from the prior exam. Findings may reflect asymmetric interstitial pulmonary edema. Bilateral interstitial infection or inflammation could have this appearance as could the interval development of interstitial fibrosis. Consider follow-up chest CT for further assessment.  2. No evidence of lobar pneumonia. Electronically Signed   By: Lajean Manes M.D.   On: 07/18/2016 12:17   Mr Virgel Paling (cerebral Arteries)  Result Date: 07/18/2016 CLINICAL DATA:  66 y/o F; altered mental status. Right-sided weakness. EXAM: MRI HEAD WITHOUT CONTRAST MRA HEAD WITHOUT CONTRAST TECHNIQUE: Multiplanar, multiecho pulse sequences of the brain and surrounding structures were obtained without intravenous contrast. Angiographic images of the head were obtained using MRA technique without contrast. COMPARISON:  07/18/2016 CT head.  01/06/2013 MRI of the brain. FINDINGS: MRI HEAD FINDINGS Brain: No acute infarction, hemorrhage, hydrocephalus, extra-axial collection or mass lesion. Stable T2 FLAIR hyperintense signal abnormality in subcortical and periventricular white matter probably representing chronic microvascular ischemic changes. Stable mild brain parenchymal volume loss. Vascular: As below. Skull and upper cervical spine: Normal marrow signal. Sinuses/Orbits: Stable partial opacification of right mastoid tip. Otherwise visualized paranasal sinuses and mastoid air cells demonstrate normal signal. Orbits are unremarkable. Other: T2 hyperintense and T1 isointense lesion centered in the right maxillary alveolar bone adjacent to incisor and premolars measuring 22 x 17 x 17 mm (AP x ML x CC series 7, image 3 and series 2, image 8). MRA HEAD FINDINGS Moderate motion artifact, suboptimal evaluation for subtle stenosis or aneurysm. Internal carotid arteries: Patent. Poor flow related signal within the right paraclinoid segment probably representing high-grade underlying stenosis. 4 mm laterally directed aneurysm arising from the left distal cavernous ICA segment. Anterior cerebral arteries:  Patent. Middle cerebral arteries: Patent. The M1 segment lumen irregularity and stenosis of right M2 inferior division. Anterior communicating artery: Patent. Posterior communicating arteries: Patent.  Left-greater-than-right P1 segment irregularity with probable areas of stenosis on the left. Posterior cerebral arteries:  Patent. Basilar artery: Patent. Short segment of mild stenosis of the midbasilar artery. Vertebral arteries:  Patent. IMPRESSION: 1. No acute intracranial abnormality. 2. Stable mild chronic microvascular ischemic changes and parenchymal volume loss of the brain. 3. New T2 hyperintense lesion of uncertain significance centered within the right maxillary alveolar bone measuring up to 22 mm adjacent to several teeth. Further characterization with facial CT is recommended on a nonemergent basis. 4. Motion degraded MRA of the head.  No large vessel occlusion. 5. Poor flow related signal in the right ICA paraclinoid segment probably represents underlying high-grade stenosis. 6. Left distal cavernous ICA 4 mm laterally directed aneurysm. 7. Lumen irregularity with areas of mild stenosis of the anterior and posterior circulation, suboptimally assessed due to motion artifact, probably representing intracranial atherosclerosis. Electronically Signed   By: Kristine Garbe M.D.   On: 07/18/2016 18:44    Scheduled Meds: .  stroke: mapping our early stages of recovery book   Does not apply Once  . albuterol  2.5 mg Nebulization Q6H  . amLODipine  5 mg Oral Daily  . aspirin EC  81 mg Oral Daily  . chlorhexidine  15 mL Mouth Rinse BID  . cloNIDine  0.2 mg Oral QHS  . cyclobenzaprine  10 mg Oral QHS  . enoxaparin (LOVENOX) injection  60 mg Subcutaneous Q24H  . FLUoxetine  40 mg Oral Daily   And  . FLUoxetine  20 mg Oral QHS  . furosemide  40 mg Oral Daily  . gabapentin  300 mg Oral QHS  . guaiFENesin  600 mg Oral BID  . lisinopril  20 mg Oral Daily   And  . hydrochlorothiazide  12.5 mg Oral Daily  . insulin aspart  0-20 Units Subcutaneous TID WC  . insulin aspart  0-5 Units Subcutaneous QHS  . insulin aspart  4 Units Subcutaneous TID WC  . insulin glargine  20 Units Subcutaneous  QHS  . levothyroxine  100 mcg Oral QAC breakfast  . mouth rinse  15 mL Mouth Rinse q12n4p  . methylPREDNISolone (SOLU-MEDROL) injection  60 mg Intravenous Q6H  . mometasone-formoterol  2 puff Inhalation BID  . montelukast  10 mg Oral QHS  . pantoprazole  80 mg Oral Q1200  . rosuvastatin  20 mg Oral q1800  . tiotropium  18 mcg Inhalation Daily  . zolpidem  5 mg Oral QHS   Continuous Infusions:   LOS: 1 day   Taimi Towe, MD FACP Hospitalist.   If 7PM-7AM, please contact night-coverage [email or web address] Password [facility] 07/19/2016, 10:19 AM

## 2016-07-19 NOTE — Progress Notes (Addendum)
PT IS TRANSFRERING TO ROOM 320 ON ON TELEMETRY.O2 AT 5L/MIN VIA [place]. O2 SAT 91%. MILD EXPIRATORY WHEEZING. LT HAND IV PATENT.HR NRS. TRANSFER REPORT CALLED TO Taylor RN ON 300. SKIN WARM AND DRY.

## 2016-07-20 DIAGNOSIS — R748 Abnormal levels of other serum enzymes: Secondary | ICD-10-CM

## 2016-07-20 LAB — HEMOGLOBIN A1C
HEMOGLOBIN A1C: 8.4 % — AB (ref 4.8–5.6)
MEAN PLASMA GLUCOSE: 194 mg/dL

## 2016-07-20 LAB — URINE CULTURE: Culture: NO GROWTH

## 2016-07-20 LAB — GLUCOSE, CAPILLARY
GLUCOSE-CAPILLARY: 177 mg/dL — AB (ref 65–99)
GLUCOSE-CAPILLARY: 271 mg/dL — AB (ref 65–99)
Glucose-Capillary: 353 mg/dL — ABNORMAL HIGH (ref 65–99)
Glucose-Capillary: 309 mg/dL — ABNORMAL HIGH (ref 65–99)

## 2016-07-20 MED ORDER — GUAIFENESIN-DM 100-10 MG/5ML PO SYRP
5.0000 mL | ORAL_SOLUTION | ORAL | Status: DC | PRN
  Administered 2016-07-20: 5 mL via ORAL
  Filled 2016-07-20: qty 5

## 2016-07-20 MED ORDER — ALPRAZOLAM 0.5 MG PO TABS
0.5000 mg | ORAL_TABLET | Freq: Three times a day (TID) | ORAL | Status: DC | PRN
  Administered 2016-07-20 – 2016-07-21 (×3): 0.5 mg via ORAL
  Filled 2016-07-20 (×3): qty 1

## 2016-07-20 NOTE — Progress Notes (Signed)
PROGRESS NOTE    [name]  TGY:[id number] DOB: [birth year] DOA: 07/18/2016 PCP: Tula Nakayama, MD    Brief Narrative: Patient is a 66 yo patient with hx of COPD on home oxygen, active smoker, DM, GERD, HTN, hypothyroidism, admitted for hypoxic respiratory failure, slurred speech, and was thought to have COPD exacerbation in addition to having accidental narcotic overdose.  She is better this am and has no neurological symptoms.  Her MRI.MRA showed no acute CVA, but it showed a small aneurysm, and high grade paraclinoid segment of the right ICA.  I spoke with neurologist Dr Rosie Fate and he recommended follow up outpatient neurology, but nothing urgent needs to be address at this time.  She was give IV Steroid and was treated for her COPD exacerbation.  She is a little lethargic this am, but easily arousable.    Assessment & Plan:   Principal Problem:   Acute respiratory failure with hypoxia (HCC) Active Problems:   Hyperkalemia   Essential hypertension   GERD   Uncontrolled diabetes mellitus with diabetic nephropathy (HCC)   COPD with acute exacerbation (HCC)   Weakness generalized   Elevated troponin   1. COPD exacerbation:  Continue with steroid, nebs, and transfer to telemetry today. Must stop cigarettes.  She is a little lethargic, so I will taper Benzo even more.  2. Chronic pain syndrome: Warned about narcotic use and respiratory arrest.   3. Carotid Stenosis and aneurysm:  Will need to follow up with neurology outpatient.  Continue with ASA and Statin. 4. DM:  Stable. 5. HTN:  Continue with meds.   DVT prophylaxis: SUB Q Heparin.  Code Status: FULL CODE.  Family Communication: sister and nephew. Disposition Plan: Home when better.   Consultants:   Phone consultation with neurology.    Antimicrobials: Anti-infectives    None       Subjective: No SOB.  Objective: Vitals:   07/20/16 0900 07/20/16 0902 07/20/16 1237 07/20/16 1425  BP:   (!) 124/57     Pulse:   (!) 110   Resp:   18   Temp:   97.5 F (36.4 C)   TempSrc:   Oral   SpO2: 94% 94% 98% 91%  Weight:      Height:        Intake/Output Summary (Last 24 hours) at 07/20/16 1610 Last data filed at 07/20/16 1200  Gross per 24 hour  Intake              720 ml  Output                0 ml  Net              720 ml   Filed Weights   07/18/16 2000 07/19/16 0400 07/19/16 1357  Weight: 113.7 kg (250 lb 10.6 oz) 113.9 kg (251 lb 1.7 oz) 114 kg (251 lb 4.8 oz)    Examination:  General exam: Appears calm and comfortable  Respiratory system: Clear to auscultation. Respiratory effort normal. Cardiovascular system: S1 & S2 heard, RRR. No JVD, murmurs, rubs, gallops or clicks. No pedal edema. Gastrointestinal system: Abdomen is nondistended, soft and nontender. No organomegaly or masses felt. Normal bowel sounds heard. Central nervous system: Alert and oriented. No focal neurological deficits. Extremities: Symmetric 5 x 5 power. Skin: No rashes, lesions or ulcers Psychiatry: Judgement and insight appear normal. Mood & affect appropriate.   Data Reviewed: I have personally reviewed following labs and imaging studies  CBC:  Recent Labs Lab 07/18/16 1225 07/19/16 0520  WBC 11.0* 10.9*  NEUTROABS 7.0  --   HGB 11.0* 11.2*  HCT 35.7* 34.8*  MCV 97.8 95.3  PLT 281 175   Basic Metabolic Panel:  Recent Labs Lab 07/18/16 1225 07/19/16 0520  NA 141 136  K 5.5* 5.4*  CL 104 102  CO2 30 28  GLUCOSE 121* 294*  BUN 39* 37*  CREATININE 1.35* 1.07*  CALCIUM 9.6 9.1   GFR: Estimated Creatinine Clearance: 64.6 mL/min (by C-G formula based on SCr of 1.07 mg/dL (H)). Liver Function Tests:  Recent Labs Lab 07/18/16 1225  AST 17  ALT 8*  ALKPHOS 93  BILITOT 0.4  PROT 7.4  ALBUMIN 3.5   Coagulation Profile:  Recent Labs Lab 07/18/16 1225  INR 1.01   Cardiac Enzymes:  Recent Labs Lab 07/18/16 1225 07/18/16 1756  TROPONINI 0.03* <0.03   HbA1C:  Recent  Labs  07/19/16 0520  HGBA1C 8.4*   CBG:  Recent Labs Lab 07/19/16 1053 07/19/16 1611 07/19/16 2107 07/20/16 0734 07/20/16 1125  GLUCAP 289* 162* 282* 353* 309*   Lipid Profile:  Recent Labs  07/19/16 0520  CHOL 148  HDL 63  LDLCALC 78  TRIG 36  CHOLHDL 2.3   Thyroid Function Tests:  Recent Labs  07/19/16 0520  TSH 0.667   Anemia Panel: No results for input(s): VITAMINB12, FOLATE, FERRITIN, TIBC, IRON, RETICCTPCT in the last 72 hours. Sepsis Labs:  Recent Labs Lab 07/18/16 1225 07/18/16 1519  LATICACIDVEN 1.0 1.2    Recent Results (from the past 240 hour(s))  Urine culture     Status: None   Collection Time: 07/18/16 11:57 AM  Result Value Ref Range Status   Specimen Description URINE, RANDOM  Final   Special Requests NONE  Final   Culture   Final    NO GROWTH Performed at [facility], [place] [address], [place], [place] [address]    Report Status 07/20/2016 FINAL  Final  MRSA PCR Screening     Status: None   Collection Time: 07/18/16  8:26 PM  Result Value Ref Range Status   MRSA by PCR NEGATIVE NEGATIVE Final    Comment:        The GeneXpert MRSA Assay (FDA approved for NASAL specimens only), is one component of a comprehensive MRSA colonization surveillance program. It is not intended to diagnose MRSA infection nor to guide or monitor treatment for MRSA infections.      Radiology Studies: Mr Brain Wo Contrast (neuro Protocol)  Result Date: 07/18/2016 CLINICAL DATA:  66 y/o F; altered mental status. Right-sided weakness. EXAM: MRI HEAD WITHOUT CONTRAST MRA HEAD WITHOUT CONTRAST TECHNIQUE: Multiplanar, multiecho pulse sequences of the brain and surrounding structures were obtained without intravenous contrast. Angiographic images of the head were obtained using MRA technique without contrast. COMPARISON:  07/18/2016 CT head.  01/06/2013 MRI of the brain. FINDINGS: MRI HEAD FINDINGS Brain: No acute infarction, hemorrhage, hydrocephalus,  extra-axial collection or mass lesion. Stable T2 FLAIR hyperintense signal abnormality in subcortical and periventricular white matter probably representing chronic microvascular ischemic changes. Stable mild brain parenchymal volume loss. Vascular: As below. Skull and upper cervical spine: Normal marrow signal. Sinuses/Orbits: Stable partial opacification of right mastoid tip. Otherwise visualized paranasal sinuses and mastoid air cells demonstrate normal signal. Orbits are unremarkable. Other: T2 hyperintense and T1 isointense lesion centered in the right maxillary alveolar bone adjacent to incisor and premolars measuring 22 x 17 x 17 mm (AP x ML x  CC series 7, image 3 and series 2, image 8). MRA HEAD FINDINGS Moderate motion artifact, suboptimal evaluation for subtle stenosis or aneurysm. Internal carotid arteries: Patent. Poor flow related signal within the right paraclinoid segment probably representing high-grade underlying stenosis. 4 mm laterally directed aneurysm arising from the left distal cavernous ICA segment. Anterior cerebral arteries:  Patent. Middle cerebral arteries: Patent. The M1 segment lumen irregularity and stenosis of right M2 inferior division. Anterior communicating artery: Patent. Posterior communicating arteries: Patent. Left-greater-than-right P1 segment irregularity with probable areas of stenosis on the left. Posterior cerebral arteries:  Patent. Basilar artery: Patent. Short segment of mild stenosis of the midbasilar artery. Vertebral arteries:  Patent. IMPRESSION: 1. No acute intracranial abnormality. 2. Stable mild chronic microvascular ischemic changes and parenchymal volume loss of the brain. 3. New T2 hyperintense lesion of uncertain significance centered within the right maxillary alveolar bone measuring up to 22 mm adjacent to several teeth. Further characterization with facial CT is recommended on a nonemergent basis. 4. Motion degraded MRA of the head.  No large vessel  occlusion. 5. Poor flow related signal in the right ICA paraclinoid segment probably represents underlying high-grade stenosis. 6. Left distal cavernous ICA 4 mm laterally directed aneurysm. 7. Lumen irregularity with areas of mild stenosis of the anterior and posterior circulation, suboptimally assessed due to motion artifact, probably representing intracranial atherosclerosis. Electronically Signed   By: Kristine Garbe M.D.   On: 07/18/2016 18:44   Mr Jodene Nam Head (cerebral Arteries)  Result Date: 07/18/2016 CLINICAL DATA:  66 y/o F; altered mental status. Right-sided weakness. EXAM: MRI HEAD WITHOUT CONTRAST MRA HEAD WITHOUT CONTRAST TECHNIQUE: Multiplanar, multiecho pulse sequences of the brain and surrounding structures were obtained without intravenous contrast. Angiographic images of the head were obtained using MRA technique without contrast. COMPARISON:  07/18/2016 CT head.  01/06/2013 MRI of the brain. FINDINGS: MRI HEAD FINDINGS Brain: No acute infarction, hemorrhage, hydrocephalus, extra-axial collection or mass lesion. Stable T2 FLAIR hyperintense signal abnormality in subcortical and periventricular white matter probably representing chronic microvascular ischemic changes. Stable mild brain parenchymal volume loss. Vascular: As below. Skull and upper cervical spine: Normal marrow signal. Sinuses/Orbits: Stable partial opacification of right mastoid tip. Otherwise visualized paranasal sinuses and mastoid air cells demonstrate normal signal. Orbits are unremarkable. Other: T2 hyperintense and T1 isointense lesion centered in the right maxillary alveolar bone adjacent to incisor and premolars measuring 22 x 17 x 17 mm (AP x ML x CC series 7, image 3 and series 2, image 8). MRA HEAD FINDINGS Moderate motion artifact, suboptimal evaluation for subtle stenosis or aneurysm. Internal carotid arteries: Patent. Poor flow related signal within the right paraclinoid segment probably representing  high-grade underlying stenosis. 4 mm laterally directed aneurysm arising from the left distal cavernous ICA segment. Anterior cerebral arteries:  Patent. Middle cerebral arteries: Patent. The M1 segment lumen irregularity and stenosis of right M2 inferior division. Anterior communicating artery: Patent. Posterior communicating arteries: Patent. Left-greater-than-right P1 segment irregularity with probable areas of stenosis on the left. Posterior cerebral arteries:  Patent. Basilar artery: Patent. Short segment of mild stenosis of the midbasilar artery. Vertebral arteries:  Patent. IMPRESSION: 1. No acute intracranial abnormality. 2. Stable mild chronic microvascular ischemic changes and parenchymal volume loss of the brain. 3. New T2 hyperintense lesion of uncertain significance centered within the right maxillary alveolar bone measuring up to 22 mm adjacent to several teeth. Further characterization with facial CT is recommended on a nonemergent basis. 4. Motion degraded MRA of the head.  No large vessel occlusion. 5. Poor flow related signal in the right ICA paraclinoid segment probably represents underlying high-grade stenosis. 6. Left distal cavernous ICA 4 mm laterally directed aneurysm. 7. Lumen irregularity with areas of mild stenosis of the anterior and posterior circulation, suboptimally assessed due to motion artifact, probably representing intracranial atherosclerosis. Electronically Signed   By: Kristine Garbe M.D.   On: 07/18/2016 18:44    Scheduled Meds: . albuterol  2.5 mg Nebulization Q6H  . amLODipine  5 mg Oral Daily  . aspirin EC  81 mg Oral Daily  . chlorhexidine  15 mL Mouth Rinse BID  . cloNIDine  0.2 mg Oral QHS  . enoxaparin (LOVENOX) injection  60 mg Subcutaneous Q24H  . FLUoxetine  40 mg Oral Daily   And  . FLUoxetine  20 mg Oral QHS  . furosemide  40 mg Oral Daily  . gabapentin  300 mg Oral QHS  . guaiFENesin  600 mg Oral BID  . lisinopril  20 mg Oral Daily   And   . hydrochlorothiazide  12.5 mg Oral Daily  . insulin aspart  0-20 Units Subcutaneous TID WC  . insulin aspart  0-5 Units Subcutaneous QHS  . insulin aspart  4 Units Subcutaneous TID WC  . insulin glargine  20 Units Subcutaneous QHS  . levothyroxine  100 mcg Oral QAC breakfast  . mouth rinse  15 mL Mouth Rinse q12n4p  . methylPREDNISolone (SOLU-MEDROL) injection  60 mg Intravenous Q6H  . mometasone-formoterol  2 puff Inhalation BID  . montelukast  10 mg Oral QHS  . pantoprazole  80 mg Oral Q1200  . rosuvastatin  20 mg Oral q1800  . tiotropium  18 mcg Inhalation Daily  . zolpidem  5 mg Oral QHS   Continuous Infusions:   LOS: 2 days   Che Rachal, MD FACP Hospitalist.   If 7PM-7AM, please contact night-coverage [email or web address] Password [email or web address] 07/20/2016, 4:10 PM

## 2016-07-21 DIAGNOSIS — R4182 Altered mental status, unspecified: Secondary | ICD-10-CM

## 2016-07-21 DIAGNOSIS — J449 Chronic obstructive pulmonary disease, unspecified: Secondary | ICD-10-CM

## 2016-07-21 LAB — GLUCOSE, CAPILLARY
Glucose-Capillary: 316 mg/dL — ABNORMAL HIGH (ref 65–99)
Glucose-Capillary: 237 mg/dL — ABNORMAL HIGH (ref 65–99)

## 2016-07-21 MED ORDER — TIOTROPIUM BROMIDE MONOHYDRATE 18 MCG IN CAPS: 18.0000 ug | ORAL_CAPSULE | Freq: Every day | RESPIRATORY_TRACT | 12 refills | Status: DC

## 2016-07-21 MED ORDER — POLYETHYLENE GLYCOL 3350 17 G PO PACK: 17.0000 g | PACK | Freq: Every day | ORAL | 0 refills | Status: DC | PRN

## 2016-07-21 NOTE — Care Management Note (Signed)
Case Management Note  Patient Details  Name: [name] MRN: [id number] Date of Birth: [birth year]  Subjective/Objective:                  Pt admitted with COPD. She is from home and lives alone. She is ind with ADL's at baseline. She has PCP, transportation to appointments and no difficulty affording or managing her medications. Pt has oxygen, neb, cane, walker, BSC PTA. She will need [facility] nursing and aid at DC. She has chosen Nurse, learning disability from [employer] [facility] providers. Pt is aware HH has 48hrs to initiate services. Tommi Rumps of [place] aware of referral and will obtain pt info from chart. Anticipate DC home in next 24hrs.   Action/Plan: Plan for DC home with [facility] services.   Expected Discharge Date:  07/22/16               Expected Discharge Plan:  [place]  In-House Referral:  NA  Discharge planning Services  CM Consult  Post Acute Care Choice:  Home Health Choice offered to:  Patient  HH Arranged:  RN [facility] Agency:  [place]  Status of Service:  Completed, signed off Sherald Barge, RN 07/21/2016, 2:06 PM

## 2016-07-21 NOTE — Discharge Summary (Signed)
Physician Discharge Summary  [name] MGQ:[id number] DOB: November 11, 1950 DOA: 07/18/2016  PCP: Tula Nakayama, MD  Admit date: 07/18/2016 Discharge date: 07/21/2016  Admitted From:  Home.  Disposition:  Home.   Recommendations for Outpatient Follow-up:  1. Follow up with PCP in 1-2 weeks  Home Health: Yes.  Equipment/Devices: DME oxygen.  Discharge Condition: alert, no trouble with breathing.  CODE STATUS: DNR.  Diet recommendation: Cardiac diet.   Brief/Interim Summary: Patient was admitted with respiratory failure on Jul 18, 2016 by Dr Nehemiah Settle.  As per his H and P:  "  HPI: [name] is a 66 y.o. female with a history of COPD with chronic respiratory failure on 4 L of oxygen nasal cannula at home, uncontrolled type 2 diabetes, eating disorder of mixed type, GERD, hypertension, hypothyroidism, hyperlipidemia. Patient presents with slurred speech and weakness that started this morning. Patient is chronic back pain and woke up in the middle the night and was inset in her recliner chair because of her pain. This morning when she awoke, she had difficulty moving her right leg. Family members also noted that her speech was slurred and she was somnolent and had generalized weakness. She was brought in by EMS - she was oriented but very drowsy with audible wheezing. She was immediately placed on nasal cannula with improvement of her oxygen saturation, however she continued to be fairly drowsy. The patient received Narcan and had some improvement of her somnolence. She continued to be globally weak with no other palliating or provoking factors.  Emergency Department Course: Patient received an hour-long nebulizer treatment and steroids. CT of the head was normal. Chest x-ray showed interstitial thickening with possible pulmonary edema, however CT showed pulmonary fibrosis with a focal posterior right upper lobe opacity about 1.1 cm in dimension. Troponin was slightly elevated at 0.03.    HOSPITAL COURSE: Patient was thought to have been accidentally overdosing her narcotics, while on benzodiapines, as her hypoxic hypovention improved with Narcan given.  She was admitted into the ICU and was continued on her Bipap.  The following morning, when I saw her, she was totally alert and conversing, off Bipap.  She then admitted that she was using another family member the narcotics for her pain.  Her stroke work up was cancelled with this information.  The MRI/MRA were already done, and it did show a small aneurysm, along with the possibility of high grade stenosis in the paraclinoid segment of the right ICA.  I spoke with neurology and Dr Rosie Fate felt that none of the finding requires immediate attention, but she should see an outpatient neurologist.  He doesn't feel she needs carotid US, but may consider it in 6 months.  She was told of these findings and her sister was aware of this finding as well.  As she was doing better, admonishing her the the narcotic use was done, and she expressed understanding that she may die if she continues to take the narcotics.  She was advised to give up smoking.  Her benzodiapines was decreased to half, and she did very well in the hospital.  She is now ready to be discharged.  She will folllow up with her PCP next week.  She was scheduled with neurology follow up also for the abnormal MRI findings.  Thank you and Good Day.   Discharge Diagnoses:  Principal Problem:   Acute respiratory failure with hypoxia (HCC) Active Problems:   Hyperkalemia   Essential hypertension   GERD  Uncontrolled diabetes mellitus with diabetic nephropathy (HCC)   COPD with acute exacerbation (HCC)   Weakness generalized   Elevated troponin    Discharge Instructions  Discharge Instructions    Face-to-face encounter (required for Medicare/Medicaid patients)    Complete by:  As directed    I [name] certify that this patient is under my care and that I, or a nurse  practitioner or physician's assistant working with me, had a face-to-face encounter that meets the physician face-to-face encounter requirements with this patient on 07/21/2016. The encounter with the patient was in whole, or in part for the following medical condition(s) which is the primary reason for home health care (List medical condition): COPD exacerbation   The encounter with the patient was in whole, or in part, for the following medical condition, which is the primary reason for home health care:  COPD exacerbation   I certify that, based on my findings, the following services are medically necessary home health services:  Nursing   Reason for Medically Necessary Home Health Services:  Other See Comments   My clinical findings support the need for the above services:  OTHER SEE COMMENTS   Further, I certify that my clinical findings support that this patient is homebound due to:  Shortness of Breath with activity   Home Health    Complete by:  As directed    To provide the following care/treatments:   [place] RN       Allergies as of 07/21/2016      Reactions   Codeine    REACTION: nausea      Medication List    STOP taking these medications   BAYER BREEZE 2 TEST Disk Generic drug:  Glucose Blood   cyclobenzaprine 10 MG tablet Commonly known as:  FLEXERIL   DIABETIC INSOLES Misc   guaiFENesin 100 MG/5ML Soln Commonly known as:  ROBITUSSIN   montelukast 10 MG tablet Commonly known as:  SINGULAIR   nystatin powder Commonly known as:  NYAMYC   silver sulfADIAZINE 1 % cream Commonly known as:  SILVADENE   zolpidem 10 MG tablet Commonly known as:  AMBIEN     TAKE these medications   acetaminophen 325 MG tablet Commonly known as:  TYLENOL Take 650 mg by mouth every 6 (six) hours as needed.   ALPRAZolam 1 MG tablet Commonly known as:  XANAX TAKE ONE TABLET FOUR TIMES A DAY   amLODipine 5 MG tablet Commonly known as:  NORVASC take 1 tablet by mouth  once daily   ASPIRIN LOW DOSE 81 MG EC tablet Generic drug:  aspirin Take 81 mg by mouth daily.   B-D INS SYR ULTRAFINE 1CC/31G 31G X 5/16" 1 ML Misc Generic drug:  Insulin Syringe-Needle U-100 use as directed FOR ONCE DAILY TESTING   cloNIDine 0.2 MG tablet Commonly known as:  CATAPRES take 1 tablet by mouth at bedtime   DUONEB 0.5-2.5 (3) MG/3ML Soln Generic drug:  ipratropium-albuterol Take 3 mLs by nebulization 4 (four) times daily as needed.   esomeprazole 40 MG capsule Commonly known as:  NEXIUM take 1 capsule by mouth once daily BEFORE BREAKFAST   ferrous sulfate 325 (65 FE) MG tablet Take 325 mg by mouth daily with breakfast.   FLUoxetine 20 MG capsule Commonly known as:  PROZAC Take two in the am and one in the evening What changed:  how much to take  how to take this  when to take this  additional instructions   Fluticasone-Salmeterol  250-50 MCG/DOSE Aepb Commonly known as:  ADVAIR DISKUS inhale 1 dose by mouth twice a day   folic acid 308 MCG tablet Commonly known as:  FOLVITE Take 400 mcg by mouth daily.   furosemide 40 MG tablet Commonly known as:  LASIX take 1 tablet by mouth once daily   gabapentin 300 MG capsule Commonly known as:  NEURONTIN Take 1 capsule (300 mg total) by mouth at bedtime.   glipiZIDE 5 MG 24 hr tablet Commonly known as:  GLUCOTROL XL Take 5 mg by mouth daily with breakfast. What changed:  Another medication with the same name was removed. Continue taking this medication, and follow the directions you see here.   insulin glargine 100 UNIT/ML injection Commonly known as:  LANTUS Inject 50 Units into the skin daily.   levothyroxine 100 MCG tablet Commonly known as:  SYNTHROID, LEVOTHROID take 1 tablet by mouth once daily   lisinopril-hydrochlorothiazide 20-12.5 MG tablet Commonly known as:  PRINZIDE,ZESTORETIC take 1 tablet by mouth once daily   polyethylene glycol packet Commonly known as:  MIRALAX /  GLYCOLAX Take 17 g by mouth daily as needed for mild constipation.   potassium chloride 20 MEQ/15ML (10%) solution TAKE 1 AND 1/2 TEASPOONFUL BY MOUTH ONCE DAILY   rosuvastatin 20 MG tablet Commonly known as:  CRESTOR take 1 tablet by mouth once daily   tiotropium 18 MCG inhalation capsule Commonly known as:  SPIRIVA Place 1 capsule (18 mcg total) into inhaler and inhale daily. Start taking on:  07/22/2016   TUMS 500 MG chewable tablet Generic drug:  calcium carbonate Chew 1 tablet by mouth as needed.   Vitamin D (Ergocalciferol) 50000 units Caps capsule Commonly known as:  DRISDOL take 1 capsule by mouth EVERY 7 DAYS   XOPENEX HFA 45 MCG/ACT inhaler Generic drug:  levalbuterol Inhale 1-2 puffs into the lungs as needed.      Follow-up Information    [facility] Follow up.   Specialty:  Home Health Services Contact information: [place] [id number] [phone]          Allergies  Allergen Reactions  . Codeine     REACTION: nausea    Consultations:  None.   Phone consultation with neurology.    Procedures/Studies: Ct Head Wo Contrast  Result Date: 07/18/2016 CLINICAL DATA:  Altered level of consciousness and slurred speech today. EXAM: CT HEAD WITHOUT CONTRAST TECHNIQUE: Contiguous axial images were obtained from the base of the skull through the vertex without intravenous contrast. COMPARISON:  Brain MRI 01/06/2013. FINDINGS: Brain: Appears normal without hemorrhage, infarct, mass lesion, mass effect, midline shift or abnormal extra-axial fluid collection. No hydrocephalus or pneumocephalus. Vascular: Atherosclerosis is seen. Skull: Intact. Sinuses/Orbits: Negative. Other: None. IMPRESSION: No acute abnormality. Atherosclerosis. Electronically Signed   By: Inge Rise M.D.   On: 07/18/2016 12:14   Ct Chest Wo Contrast  Result Date: 07/18/2016 CLINICAL DATA:  Abnormal chest radiograph. Patient lethargic with  generalized weakness. Altered mental status today. EXAM: CT CHEST WITHOUT CONTRAST TECHNIQUE: Multidetector CT imaging of the chest was performed following the standard protocol without IV contrast. COMPARISON:  Current chest radiograph. Prior chest radiograph dated 02/25/2007. FINDINGS: Cardiovascular: Heart is mildly enlarged. There is three-vessel coronary artery disease. Great vessels normal in caliber. Atherosclerotic calcifications are noted along the thoracic aorta and aortic arch branch vessels. Mediastinum/Nodes: No neck base or axillary masses or adenopathy. Mildly enlarged azygos level right peritracheal lymph node measuring 13 mm in short  axis. No discrete hilar masses or enlarged lymph nodes. Generalized prominence of the hilar is likely vascular, with this evaluation limited by lack of intravenous contrast. Lungs/Pleura: Coarse irregular interstitial thickening is noted in a patchy distribution, most prominently in the right upper lobe, but also in the left upper lobe cough anteromedial middle lobe and both lower lobes. There is associated mild bronchiectasis, most notable in the right upper lobe. There is a focal area of opacity in the posterolateral right upper lobe centered on image 41, series 4, measuring 12 x 9 mm, average 11 mm. A more discoid and patchy opacity in the inferior right upper lobe. These areas are likely atelectasis. No pleural effusion.  No pneumothorax. Upper Abdomen: Small sliding hiatal hernia. Dependent gallstones without acute cholecystitis. No acute findings. Musculoskeletal: No fracture or acute finding. No osteoblastic or osteolytic lesions. IMPRESSION: 1. Findings are consistent with pulmonary fibrosis. This affects both lungs and all lobes, but has an upper lobe predominance. There is associated bronchiectasis. This appears new when compared to the 2008 chest radiograph. 2. No evidence of pulmonary edema. 3. Focal posterior right upper lobe opacity measuring 11 mm in mean  transverse dimension. This is likely atelectasis or focal scarring. It may reflect a true nodule. Consider one of the following in 3 months for both low-risk and high-risk individuals: (a) repeat chest CT, (b) follow-up PET-CT, or (c) tissue sampling. This recommendation follows the consensus statement: Guidelines for Management of Incidental Pulmonary Nodules Detected on CT Images: From the Fleischner Society 2017; Radiology 2017; 284:228-243. 4. Mild cardiomegaly with 3 vessel coronary artery disease. Electronically Signed   By: Lajean Manes M.D.   On: 07/18/2016 14:59   Mr Brain Wo Contrast (neuro Protocol)  Result Date: 07/18/2016 CLINICAL DATA:  66 y/o F; altered mental status. Right-sided weakness. EXAM: MRI HEAD WITHOUT CONTRAST MRA HEAD WITHOUT CONTRAST TECHNIQUE: Multiplanar, multiecho pulse sequences of the brain and surrounding structures were obtained without intravenous contrast. Angiographic images of the head were obtained using MRA technique without contrast. COMPARISON:  07/18/2016 CT head.  01/06/2013 MRI of the brain. FINDINGS: MRI HEAD FINDINGS Brain: No acute infarction, hemorrhage, hydrocephalus, extra-axial collection or mass lesion. Stable T2 FLAIR hyperintense signal abnormality in subcortical and periventricular white matter probably representing chronic microvascular ischemic changes. Stable mild brain parenchymal volume loss. Vascular: As below. Skull and upper cervical spine: Normal marrow signal. Sinuses/Orbits: Stable partial opacification of right mastoid tip. Otherwise visualized paranasal sinuses and mastoid air cells demonstrate normal signal. Orbits are unremarkable. Other: T2 hyperintense and T1 isointense lesion centered in the right maxillary alveolar bone adjacent to incisor and premolars measuring 22 x 17 x 17 mm (AP x ML x CC series 7, image 3 and series 2, image 8). MRA HEAD FINDINGS Moderate motion artifact, suboptimal evaluation for subtle stenosis or aneurysm.  Internal carotid arteries: Patent. Poor flow related signal within the right paraclinoid segment probably representing high-grade underlying stenosis. 4 mm laterally directed aneurysm arising from the left distal cavernous ICA segment. Anterior cerebral arteries:  Patent. Middle cerebral arteries: Patent. The M1 segment lumen irregularity and stenosis of right M2 inferior division. Anterior communicating artery: Patent. Posterior communicating arteries: Patent. Left-greater-than-right P1 segment irregularity with probable areas of stenosis on the left. Posterior cerebral arteries:  Patent. Basilar artery: Patent. Short segment of mild stenosis of the midbasilar artery. Vertebral arteries:  Patent. IMPRESSION: 1. No acute intracranial abnormality. 2. Stable mild chronic microvascular ischemic changes and parenchymal volume loss of the brain. 3. New  T2 hyperintense lesion of uncertain significance centered within the right maxillary alveolar bone measuring up to 22 mm adjacent to several teeth. Further characterization with facial CT is recommended on a nonemergent basis. 4. Motion degraded MRA of the head.  No large vessel occlusion. 5. Poor flow related signal in the right ICA paraclinoid segment probably represents underlying high-grade stenosis. 6. Left distal cavernous ICA 4 mm laterally directed aneurysm. 7. Lumen irregularity with areas of mild stenosis of the anterior and posterior circulation, suboptimally assessed due to motion artifact, probably representing intracranial atherosclerosis. Electronically Signed   By: Kristine Garbe M.D.   On: 07/18/2016 18:44   Dg Chest Portable 1 View  Result Date: 07/18/2016 CLINICAL DATA:  Pt brought in by EMS after sister called 911 for altered mental status. Pt denies any complaints. Is oriented, but very drowsy. Audible wheezing noted. Hx of COPD, diabetes, hypertension, lung biopsy, current smoker. EXAM: PORTABLE CHEST 1 VIEW COMPARISON:  02/25/2007  FINDINGS: Since prior study, bilateral irregular interstitial thickening has developed, most evident in the right upper lobe. There are no focal areas of lung consolidation. Heart appears mildly enlarged, also new. No mediastinal or hilar masses. No convincing pleural effusion.  No pneumothorax. Skeletal structures are grossly intact. IMPRESSION: 1. New bilateral irregular interstitial thickening, with a heterogeneous distribution, greatest in the right upper lobe. There is associated cardiomegaly, with the cardiac silhouette increase in size from the prior exam. Findings may reflect asymmetric interstitial pulmonary edema. Bilateral interstitial infection or inflammation could have this appearance as could the interval development of interstitial fibrosis. Consider follow-up chest CT for further assessment. 2. No evidence of lobar pneumonia. Electronically Signed   By: Lajean Manes M.D.   On: 07/18/2016 12:17   Mr Virgel Paling (cerebral Arteries)  Result Date: 07/18/2016 CLINICAL DATA:  66 y/o F; altered mental status. Right-sided weakness. EXAM: MRI HEAD WITHOUT CONTRAST MRA HEAD WITHOUT CONTRAST TECHNIQUE: Multiplanar, multiecho pulse sequences of the brain and surrounding structures were obtained without intravenous contrast. Angiographic images of the head were obtained using MRA technique without contrast. COMPARISON:  07/18/2016 CT head.  01/06/2013 MRI of the brain. FINDINGS: MRI HEAD FINDINGS Brain: No acute infarction, hemorrhage, hydrocephalus, extra-axial collection or mass lesion. Stable T2 FLAIR hyperintense signal abnormality in subcortical and periventricular white matter probably representing chronic microvascular ischemic changes. Stable mild brain parenchymal volume loss. Vascular: As below. Skull and upper cervical spine: Normal marrow signal. Sinuses/Orbits: Stable partial opacification of right mastoid tip. Otherwise visualized paranasal sinuses and mastoid air cells demonstrate normal signal.  Orbits are unremarkable. Other: T2 hyperintense and T1 isointense lesion centered in the right maxillary alveolar bone adjacent to incisor and premolars measuring 22 x 17 x 17 mm (AP x ML x CC series 7, image 3 and series 2, image 8). MRA HEAD FINDINGS Moderate motion artifact, suboptimal evaluation for subtle stenosis or aneurysm. Internal carotid arteries: Patent. Poor flow related signal within the right paraclinoid segment probably representing high-grade underlying stenosis. 4 mm laterally directed aneurysm arising from the left distal cavernous ICA segment. Anterior cerebral arteries:  Patent. Middle cerebral arteries: Patent. The M1 segment lumen irregularity and stenosis of right M2 inferior division. Anterior communicating artery: Patent. Posterior communicating arteries: Patent. Left-greater-than-right P1 segment irregularity with probable areas of stenosis on the left. Posterior cerebral arteries:  Patent. Basilar artery: Patent. Short segment of mild stenosis of the midbasilar artery. Vertebral arteries:  Patent. IMPRESSION: 1. No acute intracranial abnormality. 2. Stable mild chronic microvascular ischemic changes and  parenchymal volume loss of the brain. 3. New T2 hyperintense lesion of uncertain significance centered within the right maxillary alveolar bone measuring up to 22 mm adjacent to several teeth. Further characterization with facial CT is recommended on a nonemergent basis. 4. Motion degraded MRA of the head.  No large vessel occlusion. 5. Poor flow related signal in the right ICA paraclinoid segment probably represents underlying high-grade stenosis. 6. Left distal cavernous ICA 4 mm laterally directed aneurysm. 7. Lumen irregularity with areas of mild stenosis of the anterior and posterior circulation, suboptimally assessed due to motion artifact, probably representing intracranial atherosclerosis. Electronically Signed   By: Kristine Garbe M.D.   On: 07/18/2016 18:44        Subjective:   Discharge Exam: Vitals:   07/20/16 2221 07/21/16 0542  BP:  (!) 107/46  Pulse: 99 (!) 58  Resp:  18  Temp:  97.5 F (36.4 C)   Vitals:   07/21/16 0152 07/21/16 0153 07/21/16 0542 07/21/16 1349  BP:   (!) 107/46   Pulse:   (!) 58   Resp:   18   Temp:   97.5 F (36.4 C)   TempSrc:   Oral   SpO2: 99% 99% 93% 96%  Weight:      Height:        General: Pt is alert, awake, not in acute distress Cardiovascular: RRR, S1/S2 +, no rubs, no gallops Respiratory: CTA bilaterally, no wheezing, no rhonchi Abdominal: Soft, NT, ND, bowel sounds + Extremities: no edema, no cyanosis    The results of significant diagnostics from this hospitalization (including imaging, microbiology, ancillary and laboratory) are listed below for reference.     Microbiology: Recent Results (from the past 240 hour(s))  Urine culture     Status: None   Collection Time: 07/18/16 11:57 AM  Result Value Ref Range Status   Specimen Description URINE, RANDOM  Final   Special Requests NONE  Final   Culture   Final    NO GROWTH Performed at [facility], [address] [place], [place] [address]    Report Status 07/20/2016 FINAL  Final  MRSA PCR Screening     Status: None   Collection Time: 07/18/16  8:26 PM  Result Value Ref Range Status   MRSA by PCR NEGATIVE NEGATIVE Final    Comment:        The GeneXpert MRSA Assay (FDA approved for NASAL specimens only), is one component of a comprehensive MRSA colonization surveillance program. It is not intended to diagnose MRSA infection nor to guide or monitor treatment for MRSA infections.      Labs: BNP (last 3 results)  Recent Labs  07/18/16 1225  BNP 80.3   Basic Metabolic Panel:  Recent Labs Lab 07/18/16 1225 07/19/16 0520  NA 141 136  K 5.5* 5.4*  CL 104 102  CO2 30 28  GLUCOSE 121* 294*  BUN 39* 37*  CREATININE 1.35* 1.07*  CALCIUM 9.6 9.1   Liver Function Tests:  Recent Labs Lab  07/18/16 1225  AST 17  ALT 8*  ALKPHOS 93  BILITOT 0.4  PROT 7.4  ALBUMIN 3.5   No results for input(s): LIPASE, AMYLASE in the last 168 hours. No results for input(s): AMMONIA in the last 168 hours. CBC:  Recent Labs Lab 07/18/16 1225 07/19/16 0520  WBC 11.0* 10.9*  NEUTROABS 7.0  --   HGB 11.0* 11.2*  HCT 35.7* 34.8*  MCV 97.8 95.3  PLT 281 271  Cardiac Enzymes:  Recent Labs Lab 07/18/16 1225 07/18/16 1756  TROPONINI 0.03* <0.03   BNP: Invalid input(s): POCBNP CBG:  Recent Labs Lab 07/20/16 1125 07/20/16 1639 07/20/16 2135 07/21/16 0825 07/21/16 1214  GLUCAP 309* 177* 271* 316* 237*   D-Dimer No results for input(s): DDIMER in the last 72 hours. Hgb A1c  Recent Labs  07/19/16 0520  HGBA1C 8.4*   Lipid Profile  Recent Labs  07/19/16 0520  CHOL 148  HDL 63  LDLCALC 78  TRIG 36  CHOLHDL 2.3   Thyroid function studies  Recent Labs  07/19/16 0520  TSH 0.667   Anemia work up No results for input(s): VITAMINB12, FOLATE, FERRITIN, TIBC, IRON, RETICCTPCT in the last 72 hours. Urinalysis    Component Value Date/Time   COLORURINE STRAW (A) 07/18/2016 1151   APPEARANCEUR CLEAR 07/18/2016 1151   LABSPEC 1.011 07/18/2016 1151   PHURINE 5.0 07/18/2016 1151   GLUCOSEU NEGATIVE 07/18/2016 1151   HGBUR NEGATIVE 07/18/2016 [place] 07/18/2016 1151   KETONESUR NEGATIVE 07/18/2016 1151   PROTEINUR NEGATIVE 07/18/2016 1151   NITRITE NEGATIVE 07/18/2016 1151   LEUKOCYTESUR NEGATIVE 07/18/2016 1151   Sepsis Labs Invalid input(s): PROCALCITONIN,  WBC,  LACTICIDVEN Microbiology Recent Results (from the past 240 hour(s))  Urine culture     Status: None   Collection Time: 07/18/16 11:57 AM  Result Value Ref Range Status   Specimen Description URINE, RANDOM  Final   Special Requests NONE  Final   Culture   Final    NO GROWTH Performed at [facility], [place] [address], [place], [place] [address]    Report Status  07/20/2016 FINAL  Final  MRSA PCR Screening     Status: None   Collection Time: 07/18/16  8:26 PM  Result Value Ref Range Status   MRSA by PCR NEGATIVE NEGATIVE Final    Comment:        The GeneXpert MRSA Assay (FDA approved for NASAL specimens only), is one component of a comprehensive MRSA colonization surveillance program. It is not intended to diagnose MRSA infection nor to guide or monitor treatment for MRSA infections.      Time coordinating discharge: Over 30 minutes  SIGNED:   Orvan Falconer, MD FACP [facility] 07/21/2016, 2:15 PM   If 7PM-7AM, please contact night-coverage [email or web address] Password [email or web address]

## 2016-07-21 NOTE — Progress Notes (Signed)
Patient discharged home.  IV removed - WNL.  Reviewed medications and DC instructions.  Instructed to follow up with neuro and PCP.  Verbalizes understanding.  All questions answered to patients satisfaction.  Advised to take all meds as prescribed to prevent further COPD exacerbation.  Patient in NAD at this time

## 2016-07-21 NOTE — Care Management Important Message (Signed)
Important Message  Patient Details  Name: [name] MRN: [id number] Date of Birth: Sep 09, 1950   Medicare Important Message Given:  Yes    Sherald Barge, RN 07/21/2016, 2:16 PM

## 2016-07-21 NOTE — Progress Notes (Signed)
Inpatient Diabetes Program Recommendations  AACE/ADA: New Consensus Statement on Inpatient Glycemic Control (2015)  Target Ranges:  Prepandial:   less than 140 mg/dL      Peak postprandial:   less than 180 mg/dL (1-2 hours)      Critically ill patients:  140 - 180 mg/dL   Results for [name], [name] (MRN [id number]) as of 07/21/2016 10:14  Ref. Range 07/20/2016 07:34 07/20/2016 11:25 07/20/2016 16:39 07/20/2016 21:35 07/21/2016 08:25  Glucose-Capillary Latest Ref Range: 65 - 99 mg/dL 353 (H) 309 (H) 177 (H) 271 (H) 316 (H)    Admit with: Respiratory Failure  History: DM, CHF, COPD  Home DM Meds: Lantus 50 units daily       Glipizide 5 mg daily  Current Insulin Orders: Lantus 20 units QHS       Novolog Resistant Correction Scale/ SSI (0-20 units) TID AC + HS      Novolog 4 units TID with meals       MD- Note patient receiving Solumedrol 60 mg Q6 hours.  Glucose levels quite elevated likely due to steroids.  Takes much larger dose of Lantus at home.  Please consider the following in-hospital insulin adjustments:  1. Increase Lantus to 40 units QHS (80% total home dose)  2. Increase Novolog Meal Coverage to: Novolog 6 units TID with meals (hold if pt eats <50% of meal)     --Will follow patient during hospitalization--  Wyn Quaker RN, MSN, CDE Diabetes Coordinator Inpatient Glycemic Control Team Team Pager: [phone] (8a-5p)

## 2016-07-21 NOTE — Progress Notes (Signed)
Patient appears to be doing well with BIPAP /nasal mask 3lpm oxygen bled in.

## 2016-07-22 ENCOUNTER — Telehealth: Payer: Self-pay

## 2016-07-22 ENCOUNTER — Telehealth: Payer: Self-pay | Admitting: Family Medicine

## 2016-07-22 DIAGNOSIS — F17210 Nicotine dependence, cigarettes, uncomplicated: Secondary | ICD-10-CM | POA: Diagnosis not present

## 2016-07-22 DIAGNOSIS — Z9981 Dependence on supplemental oxygen: Secondary | ICD-10-CM | POA: Diagnosis not present

## 2016-07-22 DIAGNOSIS — G894 Chronic pain syndrome: Secondary | ICD-10-CM | POA: Diagnosis not present

## 2016-07-22 DIAGNOSIS — I10 Essential (primary) hypertension: Secondary | ICD-10-CM | POA: Diagnosis not present

## 2016-07-22 DIAGNOSIS — Z7982 Long term (current) use of aspirin: Secondary | ICD-10-CM | POA: Diagnosis not present

## 2016-07-22 DIAGNOSIS — E1121 Type 2 diabetes mellitus with diabetic nephropathy: Secondary | ICD-10-CM | POA: Diagnosis not present

## 2016-07-22 DIAGNOSIS — Z794 Long term (current) use of insulin: Secondary | ICD-10-CM | POA: Diagnosis not present

## 2016-07-22 DIAGNOSIS — J441 Chronic obstructive pulmonary disease with (acute) exacerbation: Secondary | ICD-10-CM | POA: Diagnosis not present

## 2016-07-22 DIAGNOSIS — I720 Aneurysm of carotid artery: Secondary | ICD-10-CM | POA: Diagnosis not present

## 2016-07-22 DIAGNOSIS — E039 Hypothyroidism, unspecified: Secondary | ICD-10-CM | POA: Diagnosis not present

## 2016-07-22 MED ORDER — GLIPIZIDE ER 10 MG PO TB24: 10.0000 mg | ORAL_TABLET | Freq: Every day | ORAL | 2 refills | Status: DC

## 2016-07-22 NOTE — Telephone Encounter (Signed)
Hospital MD prescribed Lantus 50 units daily and patient cannot afford it. [facility] nurse calling and wants to know what you want to do about that until next month when she can get it from the pharmacy. Please advise

## 2016-07-22 NOTE — Telephone Encounter (Signed)
Advise her to use glipizide 10 mg daily, and notify us if FBG over 130 or bedtme over 180

## 2016-07-22 NOTE — Telephone Encounter (Signed)
Call placed earlier today by nurse Velna Hatchet, with specific regard to medication management following recent d/c for insulin request which pt is unable to afford.I recommended double dose glipizide to 10 mg, pt has not been on insulin for over 12 months and her blood suagr control is directly dependent on what she eats and drinks. Also is advised to call for fasting and bedtime blood sugars being outside reference range , and she has a f/u appt scheduled for 1 week She was  admitted from 2/16 to 2/19 with respiratory failure Only stated med barrier from nurse call and pt contact is lantus, she is aware oif easong for admission, breathing is improved, and voices no barriers to transport to he rvisit I

## 2016-07-22 NOTE — Telephone Encounter (Signed)
Pt aware and med sent  

## 2016-07-23 ENCOUNTER — Ambulatory Visit: Payer: Self-pay | Admitting: Family Medicine

## 2016-07-25 DIAGNOSIS — J441 Chronic obstructive pulmonary disease with (acute) exacerbation: Secondary | ICD-10-CM | POA: Diagnosis not present

## 2016-07-25 DIAGNOSIS — G894 Chronic pain syndrome: Secondary | ICD-10-CM | POA: Diagnosis not present

## 2016-07-28 ENCOUNTER — Ambulatory Visit: Payer: Self-pay

## 2016-07-28 DIAGNOSIS — J441 Chronic obstructive pulmonary disease with (acute) exacerbation: Secondary | ICD-10-CM | POA: Diagnosis not present

## 2016-07-28 DIAGNOSIS — G894 Chronic pain syndrome: Secondary | ICD-10-CM | POA: Diagnosis not present

## 2016-07-29 ENCOUNTER — Encounter: Payer: Self-pay | Admitting: Family Medicine

## 2016-07-29 ENCOUNTER — Ambulatory Visit (INDEPENDENT_AMBULATORY_CARE_PROVIDER_SITE_OTHER): Payer: Medicare Other | Admitting: Family Medicine

## 2016-07-29 VITALS — BP 120/72 | HR 89 | Resp 18 | Ht 63.0 in | Wt 245.8 lb

## 2016-07-29 DIAGNOSIS — F17200 Nicotine dependence, unspecified, uncomplicated: Secondary | ICD-10-CM

## 2016-07-29 DIAGNOSIS — E1165 Type 2 diabetes mellitus with hyperglycemia: Secondary | ICD-10-CM

## 2016-07-29 DIAGNOSIS — E1121 Type 2 diabetes mellitus with diabetic nephropathy: Secondary | ICD-10-CM | POA: Diagnosis not present

## 2016-07-29 DIAGNOSIS — R9402 Abnormal brain scan: Secondary | ICD-10-CM | POA: Diagnosis not present

## 2016-07-29 DIAGNOSIS — Z794 Long term (current) use of insulin: Secondary | ICD-10-CM

## 2016-07-29 DIAGNOSIS — Z09 Encounter for follow-up examination after completed treatment for conditions other than malignant neoplasm: Secondary | ICD-10-CM | POA: Diagnosis not present

## 2016-07-29 DIAGNOSIS — I10 Essential (primary) hypertension: Secondary | ICD-10-CM

## 2016-07-29 DIAGNOSIS — IMO0002 Reserved for concepts with insufficient information to code with codable children: Secondary | ICD-10-CM

## 2016-07-29 MED ORDER — INSULIN GLARGINE 100 UNIT/ML SOLOSTAR PEN: 30.0000 [IU] | PEN_INJECTOR | Freq: Every day | SUBCUTANEOUS | 11 refills | Status: DC

## 2016-07-29 NOTE — Patient Instructions (Addendum)
F/U May 21 or after , call if you need me sooner  Start  lantus 15 units daily, follow low carb diet, take glipizide 10 mg daily  I recommend SNF due to inability to care for yourself, rept falls , need help with ADL's and medication non adherence   ONLY take medication as directed    Please work on smoking cessation  Non fast hBa1C chem 7 and EGFER 3 datys before follow up

## 2016-08-01 DIAGNOSIS — J441 Chronic obstructive pulmonary disease with (acute) exacerbation: Secondary | ICD-10-CM | POA: Diagnosis not present

## 2016-08-01 DIAGNOSIS — G894 Chronic pain syndrome: Secondary | ICD-10-CM | POA: Diagnosis not present

## 2016-08-04 DIAGNOSIS — J441 Chronic obstructive pulmonary disease with (acute) exacerbation: Secondary | ICD-10-CM | POA: Diagnosis not present

## 2016-08-04 DIAGNOSIS — G894 Chronic pain syndrome: Secondary | ICD-10-CM | POA: Diagnosis not present

## 2016-08-05 DIAGNOSIS — R4182 Altered mental status, unspecified: Secondary | ICD-10-CM

## 2016-08-10 ENCOUNTER — Encounter: Payer: Self-pay | Admitting: Family Medicine

## 2016-08-10 DIAGNOSIS — R9402 Abnormal brain scan: Secondary | ICD-10-CM | POA: Insufficient documentation

## 2016-08-10 DIAGNOSIS — Z09 Encounter for follow-up examination after completed treatment for conditions other than malignant neoplasm: Secondary | ICD-10-CM | POA: Insufficient documentation

## 2016-08-10 NOTE — Assessment & Plan Note (Signed)
Needs neurology follow uo also scan  Recommended, will discuss at next visit

## 2016-08-10 NOTE — Progress Notes (Signed)
   [name]     MRN: [id number]      DOB: [birth year]   HPI [name] is in for follow up of recent hospitalization form 2/16 to 07/21/2016 because of respiratory failure, felt to have accidentally overdosed on narcs while on benzos as her hypoxia and mental status responded to narcan Discharge summary, and laboratory and radiology data are reviewed, and any questions or concerns about recent hospitalization are discussed. I truly believe that she is currently better off in an asssisted living facility/skilled facility at this time as she in the past 4 months has clearly demonstrated a lack of ability to safely live independently and care for herself She is to follow up with neurology due abnormal MRI brain, this was arranged during hospitalization, will need to follow up to ensure this has been carried out.  .  ROS Denies recent fever or chills. Denies sinus pressure, nasal congestion, ear pain or sore throat. C/o chronic cough, wheeze and chest congestion Denies chest pains, palpitations and leg swelling Denies abdominal pain, nausea, vomiting,diarrhea or constipation.   Denies dysuria, frequency, hesitancy or incontinence. C/o chronic  joint pain, swelling and limitation in mobility. Denies headaches, seizures, numbness, or tingling. Denies  Uncontrolled depression, anxiety or insomnia. Denies skin break down or rash.   PE  BP 120/72   Pulse 89   Resp 18   Ht '5\' 3"'$  (1.6 m)   Wt 245 lb 12.8 oz (111.5 kg)   SpO2 91%   BMI 43.54 kg/m   Patient alert and oriented and in no cardiopulmonary distress.  HEENT: No facial asymmetry, EOMI,   oropharynx pink and moist.  Neck supple no JVD, no mass.  Chest: decreased air entry ,  Scattered crackles and wheezes  CVS: S1, S2 no murmurs, no S3.Regular rate.  ABD: Soft non tender.   Ext: No edema  MS: decreased  ROM spine, shoulders, hips and knees.  Skin: Intact, no ulcerations or rash noted.  Psych: Good eye contact,  normal affect. Memory intact not anxious or depressed appearing.  CNS: CN 2-12 intact, power,  normal throughout.no focal deficits noted.   [name] [facility] discharge follow-up Pt hospitalized for 4 days with acute on chronic respiratory failure from likely unintentional overdose, also continues to smoke. Discussed with patient and her sister that I do believe that at this time , she would be best placed in a SNF until her health improves,, she has unintentionally hurt herself seriously twice since this year, not interested at this time, but will think about it. She is on no narcotic medication for pain and her mental health medication is through psychiatry, dose of xanax d/c on is half her prior dose  NICOTINE ADDICTION Patient counseled for approximately 5 minutes regarding the health risks of ongoing nicotine use, specifically all types of cancer, heart disease, stroke and respiratory failure. The options available for help with cessation ,the behavioral changes to assist the process, and the option to either gradully reduce usage  Or abruptly stop.is also discussed. Pt is also encouraged to set specific goals in number of cigarettes used daily, as well as to set a quit date.     Abnormal brain scan Needs neurology follow uo also scan  Recommended, will discuss at next visit

## 2016-08-10 NOTE — Assessment & Plan Note (Signed)

## 2016-08-10 NOTE — Assessment & Plan Note (Signed)
Pt hospitalized for 4 days with acute on chronic respiratory failure from likely unintentional overdose, also continues to smoke. Discussed with patient and her sister that I do believe that at this time , she would be best placed in a SNF until her health improves,, she has unintentionally hurt herself seriously twice since this year, not interested at this time, but will think about it. She is on no narcotic medication for pain and her mental health medication is through psychiatry, dose of xanax d/c on is half her prior dose

## 2016-08-11 DIAGNOSIS — G894 Chronic pain syndrome: Secondary | ICD-10-CM | POA: Diagnosis not present

## 2016-08-11 DIAGNOSIS — J441 Chronic obstructive pulmonary disease with (acute) exacerbation: Secondary | ICD-10-CM | POA: Diagnosis not present

## 2016-08-12 ENCOUNTER — Other Ambulatory Visit: Payer: Self-pay

## 2016-08-12 MED ORDER — INSULIN PEN NEEDLE 31G X 5 MM MISC: 5 refills | Status: DC

## 2016-08-19 ENCOUNTER — Ambulatory Visit: Payer: Self-pay | Admitting: Family Medicine

## 2016-09-01 ENCOUNTER — Ambulatory Visit (HOSPITAL_COMMUNITY): Payer: Self-pay | Admitting: Psychiatry

## 2016-10-07 ENCOUNTER — Ambulatory Visit: Payer: Self-pay | Admitting: Family Medicine

## 2016-10-08 ENCOUNTER — Encounter (HOSPITAL_COMMUNITY): Payer: Self-pay | Admitting: Psychiatry

## 2016-10-08 ENCOUNTER — Ambulatory Visit (INDEPENDENT_AMBULATORY_CARE_PROVIDER_SITE_OTHER): Payer: Medicare Other | Admitting: Psychiatry

## 2016-10-08 VITALS — BP 130/74 | HR 82 | Ht 61.0 in | Wt 256.0 lb

## 2016-10-08 DIAGNOSIS — Z811 Family history of alcohol abuse and dependence: Secondary | ICD-10-CM | POA: Diagnosis not present

## 2016-10-08 DIAGNOSIS — Z818 Family history of other mental and behavioral disorders: Secondary | ICD-10-CM

## 2016-10-08 DIAGNOSIS — F333 Major depressive disorder, recurrent, severe with psychotic symptoms: Secondary | ICD-10-CM

## 2016-10-08 DIAGNOSIS — Z79899 Other long term (current) drug therapy: Secondary | ICD-10-CM | POA: Diagnosis not present

## 2016-10-08 DIAGNOSIS — Z794 Long term (current) use of insulin: Secondary | ICD-10-CM | POA: Diagnosis not present

## 2016-10-08 MED ORDER — ALPRAZOLAM 1 MG PO TABS: ORAL_TABLET | ORAL | 2 refills | Status: DC

## 2016-10-08 MED ORDER — FLUOXETINE HCL 20 MG PO CAPS: ORAL_CAPSULE | ORAL | 2 refills | Status: DC

## 2016-10-08 NOTE — Progress Notes (Signed)
Patient ID: [name], female   DOB: 1951-01-07, 66 y.o.   MRN: [id number] Patient ID: [name], female   DOB: April 16, 1951, 66 y.o.   MRN: [id number] Patient ID: [name], female   DOB: 06-27-1950, 66 y.o.   MRN: [id number] Patient ID: [name], female   DOB: Mar 08, 1951, 66 y.o.   MRN: [id number] Patient ID: [name], female   DOB: 1951-01-19, 66 y.o.   MRN: [id number] Patient ID: [name], female   DOB: 02-28-1951, 66 y.o.   MRN: [id number] Patient ID: [name], female   DOB: 1951-04-07, 66 y.o.   MRN: [id number]  Psychiatric Assessment Adult  Patient Identification:  [name] Date of Evaluation:  10/08/2016 Chief Complaint: depression History of Chief Complaint:   No chief complaint on file.   Depression         Associated symptoms include fatigue and appetite change.  Past medical history includes anxiety.   Anxiety  Symptoms include nervous/anxious behavior and shortness of breath.    this patient is a 66 year old widowed black female who lives alone in [place]. She has no children. She is on disability.  The patient was referred by Dr. Moshe Cipro her primary care physician and Dr. Jefm Miles her psychologist for further evaluation and treatment of major depression and anxiety.  The patient states that she's been depressed since her teen years. She's not really sure why. When she was in college she went through severe depression and had to quit. She's had this on and off through the years but really not receive treatment until a couple years ago after her husband died. Her primary care physician has put her on Prozac and Xanax which have helped to some degree.however recently she has been getting more depressed. She has severe COPD and is on oxygen. Yet, she continues to smoke THREE PACKS PER DAY which she claims is secondary to her depressed mood.  Recently she's gone through more deaths including her sister in 2014 a niece in 2014.  Another sister was recently diagnosed with cancer. The patient has gotten overwhelmed by all this and claims it's getting hard for her to just get out of bed and function. She has no energy, she's been shaky and having tremors in her hands, she complains of short-term memory loss.her sleep is variable and Ambien sometimes helps. Her appetite is up and down. Sometimes she hears the voices of her deceased relatives or "feels her spirits." She's never been to a psychiatric hospitalization  But has been seeing Dr. Jefm Miles in our office for several years. She has never been suicidal and states she is very "spiritual and values life. She stays away from people but she has a god niece and  God sister who check on her faithfully.she denies any other psychotic symptoms and does not use drugs or alcohol  The patient returns after 3 months. She states that she was hospitalized in February for altered mental status. She reluctantly admitted that she had gotten oxycodone's from one of her sisters because her back was hurting from a previous fall. The combination of this with the Xanax and the muscle relaxer made her extremely lethargic. Opioids and benzodiazepines showed up in her drug screen and she had a positive response to Narcan. Since then she is not taking muscle relaxers or opioids but has maintained Xanax at 1 mg 4 times a day. She states that this episode really scared her and she would never use these other  medicines again but she "can't function" without the Xanax. She states that usually she only takes 2-3 mg per day. I told her we would have to watch is very carefully because as she gets older and has more medical issues and becomes less safe to use high doses of benzodiazepines. Today she is very bright and alert her mood is good. Some abnormalities were seen on her brain MRI and MRA but she has refused to follow up with neurology claiming "there is nothing they can do.". Review of Systems  Constitutional:  Positive for activity change, appetite change and fatigue.  HENT: Negative.   Eyes: Negative.   Respiratory: Positive for shortness of breath.   Cardiovascular: Negative.   Gastrointestinal: Negative.   Endocrine: Negative.   Genitourinary: Negative.   Musculoskeletal: Positive for arthralgias.  Skin: Negative.   Allergic/Immunologic: Negative.   Neurological: Positive for tremors.  Hematological: Negative.   Psychiatric/Behavioral: Positive for depression, dysphoric mood, hallucinations and sleep disturbance. The patient is nervous/anxious.    Physical Examnot done  Depressive Symptoms: depressed mood, anhedonia, psychomotor retardation, difficulty concentrating, impaired memory, anxiety, disturbed sleep, decreased appetite,  (Hypo) Manic Symptoms:   Elevated Mood:  No Irritable Mood:  No Grandiosity:  No Distractibility:  No Labiality of Mood:  No Delusions:  No Hallucinations:  Yes Impulsivity:  Yes Sexually Inappropriate Behavior:  No Financial Extravagance:  No Flight of Ideas:  No  Anxiety Symptoms: Excessive Worry:  Yes Panic Symptoms:  No Agoraphobia:  No Obsessive Compulsive: No  Symptoms: None, Specific Phobias:  No Social Anxiety:  No  Psychotic Symptoms:  Hallucinations: Yes Auditory Delusions:  No Paranoia:  No   Ideas of Reference:  No  PTSD Symptoms: Ever had a traumatic exposure:  No Had a traumatic exposure in the last month:  No Re-experiencing: No None Hypervigilance:  No Hyperarousal: No None Avoidance: No None  Traumatic Brain Injury: No   Past Psychiatric History: Diagnosis:Maj. depression  Hospitalizations: none  Outpatient Care: has been seeing Dr. Jefm Miles in our office for counseling  Substance Abuse Care: none  Self-Mutilation: none  Suicidal Attempts:none  Violent Behaviors: none   Past Medical History:   Past Medical History:  Diagnosis Date  . Anxiety   . COPD (chronic obstructive pulmonary disease) ([place])    . Depression 2008  . Diabetes mellitus, type 1 1983   type 2 diabetes  . DJD (degenerative joint disease)    of the spine   . GERD (gastroesophageal reflux disease)   . Hyperlipidemia 2007  . Hypertension 1983  . Hypothyroidism 2004   left thyroidectomy  . Insomnia   . Obesity    History of Loss of Consciousness:  No Seizure History:  No Cardiac History:  No Allergies:   Allergies  Allergen Reactions  . Codeine     REACTION: nausea   Current Medications:  Current Outpatient Prescriptions  Medication Sig Dispense Refill  . acetaminophen (TYLENOL) 325 MG tablet Take 650 mg by mouth every 6 (six) hours as needed.    . ALPRAZolam (XANAX) 1 MG tablet TAKE ONE TABLET FOUR TIMES A DAY 120 tablet 2  . amLODipine (NORVASC) 5 MG tablet take 1 tablet by mouth once daily 90 tablet 1  . aspirin (ASPIRIN LOW DOSE) 81 MG EC tablet Take 81 mg by mouth daily.      . B-D INS SYR ULTRAFINE 1CC/31G 31G X 5/16" 1 ML MISC use as directed FOR ONCE DAILY TESTING 100 each 5  . calcium carbonate (TUMS)  500 MG chewable tablet Chew 1 tablet by mouth as needed.      . cloNIDine (CATAPRES) 0.2 MG tablet take 1 tablet by mouth at bedtime 90 tablet 1  . esomeprazole (NEXIUM) 40 MG capsule take 1 capsule by mouth once daily BEFORE BREAKFAST 90 capsule 1  . ferrous sulfate 325 (65 FE) MG tablet Take 325 mg by mouth daily with breakfast.    . FLUoxetine (PROZAC) 20 MG capsule Take two in the am and one in the evening 90 capsule 2  . Fluticasone-Salmeterol (ADVAIR DISKUS) 250-50 MCG/DOSE AEPB inhale 1 dose by mouth twice a day 60 each 4  . folic acid (FOLVITE) 329 MCG tablet Take 400 mcg by mouth daily.    . furosemide (LASIX) 40 MG tablet take 1 tablet by mouth once daily 30 tablet 4  . gabapentin (NEURONTIN) 300 MG capsule Take 1 capsule (300 mg total) by mouth at bedtime. 30 capsule 3  . glipiZIDE (GLIPIZIDE XL) 10 MG 24 hr tablet Take 1 tablet (10 mg total) by mouth daily with breakfast. 30 tablet 2  .  Insulin Glargine (LANTUS SOLOSTAR) 100 UNIT/ML Solostar Pen Inject 30 Units into the skin daily at 10 pm. 15 mL 11  . Insulin Pen Needle 31G X 5 MM MISC Use once daily to inject insulin dx E11.65 50 each 5  . ipratropium-albuterol (DUONEB) 0.5-2.5 (3) MG/3ML SOLN Take 3 mLs by nebulization 4 (four) times daily as needed.     . levalbuterol (XOPENEX HFA) 45 MCG/ACT inhaler Inhale 1-2 puffs into the lungs as needed.      Marland Kitchen levothyroxine (SYNTHROID, LEVOTHROID) 100 MCG tablet take 1 tablet by mouth once daily 90 tablet 1  . lisinopril-hydrochlorothiazide (PRINZIDE,ZESTORETIC) 20-12.5 MG tablet take 1 tablet by mouth once daily 90 tablet 1  . polyethylene glycol (MIRALAX / GLYCOLAX) packet Take 17 g by mouth daily as needed for mild constipation. 14 each 0  . potassium chloride 20 MEQ/15ML (10%) solution TAKE 1 AND 1/2 TEASPOONFUL BY MOUTH ONCE DAILY 240 mL 2  . rosuvastatin (CRESTOR) 20 MG tablet take 1 tablet by mouth once daily 90 tablet 1  . Vitamin D, Ergocalciferol, (DRISDOL) 50000 units CAPS capsule take 1 capsule by mouth EVERY 7 DAYS 4 capsule 11  . tiotropium (SPIRIVA) 18 MCG inhalation capsule Place 1 capsule (18 mcg total) into inhaler and inhale daily. (Patient not taking: Reported on 07/29/2016) 30 capsule 12   No current facility-administered medications for this visit.     Previous Psychotropic Medications:  Medication Dose   Wellbutrin-caused irritability                       Substance Abuse History in the last 12 months: Substance Age of 1st Use Last Use Amount Specific Type  Nicotine   Smokes 3 packs of cigarettes per day   Alcohol      Cannabis      Opiates      Cocaine      Methamphetamines      LSD      Ecstasy      Benzodiazepines      Caffeine      Inhalants      Others:                          Medical Consequences of Substance Abuse: smoking is making her COPD much worse  Legal Consequences of Substance Abuse: none  Family Consequences  of  Substance Abuse: none  Blackouts:  No DT's:  No Withdrawal Symptoms:  No None  Social History: Current Place of Residence: [place] of Birth: [place] Family Members: 2 sisters Marital Status:  Widowed Children: none   Relationships:  Education:  Dentist Problems/Performance:  Religious Beliefs/Practices: Christian History of Abuse: none Energy manager History:  None. Legal History: none Hobbies/Interests: listening to gospel music  Family History:   Family History  Problem Relation Age of Onset  . Hypertension Mother   . Stroke Mother   . Cancer Father     lung   . Hypertension Sister   . Depression Sister   . Alcohol abuse Sister   . Hypertension Sister   . Depression Sister   . Cancer Sister   . Diabetes Sister   . Depression Sister   . Cancer Sister     renal   . Kidney failure Brother   . Kidney failure      family history   . Hypertension      family history     Mental Status Examination/Evaluation: Objective:  Appearance:  using portable oxygen,good hygiene, bright and alert   Eye Contact::  Good  Speech:  Clear and Coherent  Volume:  Decreased  Mood:good  Affect:  Bright  Thought Process:  Circumstantial  Orientation:  Full (Time, Place, and Person)  Thought Content:  Rumination   Suicidal Thoughts:  No  Homicidal Thoughts:  No  Judgement:  Fair  Insight:  Fair  Psychomotor Activity:  Decreased  Akathisia:  No  Handed:  Right  AIMS (if indicated):    Assets:  Communication Skills Desire for Improvement Social Support    Laboratory/X-Ray Psychological Evaluation(s)   reviewed in chart     Assessment:  Axis I: Major Depression, Recurrent severe  AXIS I Major Depression, Recurrent severe  AXIS II Deferred  AXIS III Past Medical History:  Diagnosis Date  . Anxiety   . COPD (chronic obstructive pulmonary disease) ([place])   . Depression 2008  . Diabetes mellitus,  type 1 1983   type 2 diabetes  . DJD (degenerative joint disease)    of the spine   . GERD (gastroesophageal reflux disease)   . Hyperlipidemia 2007  . Hypertension 1983  . Hypothyroidism 2004   left thyroidectomy  . Insomnia   . Obesity      AXIS IV problems related to social environment  AXIS V 41-50 serious symptoms   Treatment Plan/Recommendations:  Plan of Care: medication management  Laboratory:   Psychotherapy: she is seeing Dr. Jefm Miles here  Medications: she'll continue Prozac 60 mg daily for depression and Xanax 1 mg 4 times a day for anxiety.   Routine PRN Medications:  No  Consultations:   Safety Concerns:  She denies thoughts or plans of self-harm  Other:she'll return in 2 months     Levonne Spiller, MD 5/9/20183:44 PM    Patient ID: Maris Berger, female   DOB: 1950-12-05, 66 y.o.   MRN: [id number]

## 2016-10-22 DIAGNOSIS — Z794 Long term (current) use of insulin: Secondary | ICD-10-CM | POA: Diagnosis not present

## 2016-10-22 DIAGNOSIS — E1165 Type 2 diabetes mellitus with hyperglycemia: Secondary | ICD-10-CM | POA: Diagnosis not present

## 2016-10-22 DIAGNOSIS — E1121 Type 2 diabetes mellitus with diabetic nephropathy: Secondary | ICD-10-CM | POA: Diagnosis not present

## 2016-10-22 DIAGNOSIS — I10 Essential (primary) hypertension: Secondary | ICD-10-CM | POA: Diagnosis not present

## 2016-10-23 ENCOUNTER — Encounter: Payer: Self-pay | Admitting: Family Medicine

## 2016-10-23 ENCOUNTER — Ambulatory Visit (INDEPENDENT_AMBULATORY_CARE_PROVIDER_SITE_OTHER): Payer: Medicare Other | Admitting: Family Medicine

## 2016-10-23 VITALS — BP 110/80 | HR 88 | Resp 16 | Ht 63.0 in | Wt 245.0 lb

## 2016-10-23 DIAGNOSIS — F17200 Nicotine dependence, unspecified, uncomplicated: Secondary | ICD-10-CM | POA: Diagnosis not present

## 2016-10-23 DIAGNOSIS — R9389 Abnormal findings on diagnostic imaging of other specified body structures: Secondary | ICD-10-CM

## 2016-10-23 DIAGNOSIS — I10 Essential (primary) hypertension: Secondary | ICD-10-CM | POA: Diagnosis not present

## 2016-10-23 DIAGNOSIS — E0865 Diabetes mellitus due to underlying condition with hyperglycemia: Secondary | ICD-10-CM

## 2016-10-23 DIAGNOSIS — R938 Abnormal findings on diagnostic imaging of other specified body structures: Secondary | ICD-10-CM

## 2016-10-23 DIAGNOSIS — E785 Hyperlipidemia, unspecified: Secondary | ICD-10-CM

## 2016-10-23 DIAGNOSIS — R928 Other abnormal and inconclusive findings on diagnostic imaging of breast: Secondary | ICD-10-CM

## 2016-10-23 DIAGNOSIS — Z794 Long term (current) use of insulin: Secondary | ICD-10-CM

## 2016-10-23 DIAGNOSIS — E0821 Diabetes mellitus due to underlying condition with diabetic nephropathy: Secondary | ICD-10-CM

## 2016-10-23 DIAGNOSIS — F17210 Nicotine dependence, cigarettes, uncomplicated: Secondary | ICD-10-CM | POA: Diagnosis not present

## 2016-10-23 DIAGNOSIS — IMO0002 Reserved for concepts with insufficient information to code with codable children: Secondary | ICD-10-CM

## 2016-10-23 LAB — BASIC METABOLIC PANEL WITH GFR
BUN: 14 mg/dL (ref 7–25)
CALCIUM: 9.3 mg/dL (ref 8.6–10.4)
CO2: 32 mmol/L — ABNORMAL HIGH (ref 20–31)
Chloride: 99 mmol/L (ref 98–110)
Creat: 1.04 mg/dL — ABNORMAL HIGH (ref 0.50–0.99)
GFR, EST AFRICAN AMERICAN: 66 mL/min (ref >=60)
GFR, EST NON AFRICAN AMERICAN: 57 mL/min — AB (ref >=60)
Glucose, Bld: 120 mg/dL — ABNORMAL HIGH (ref 65–99)
Potassium: 3.9 mmol/L (ref 3.5–5.3)
SODIUM: 141 mmol/L (ref 135–146)

## 2016-10-23 LAB — HEMOGLOBIN A1C
HEMOGLOBIN A1C: 9.4 % — AB (ref <5.7)
MEAN PLASMA GLUCOSE: 223 mg/dL

## 2016-10-23 MED ORDER — GLIPIZIDE ER 10 MG PO TB24: ORAL_TABLET | ORAL | 5 refills | Status: DC

## 2016-10-23 NOTE — Assessment & Plan Note (Signed)
uincontrolled [name] is reminded of the importance of commitment to daily physical activity for 30 minutes or more, as able and the need to limit carbohydrate intake to 30 to 60 grams per meal to help with blood sugar control.   The need to take medication as prescribed, test blood sugar as directed, and to call between visits if there is a concern that blood sugar is uncontrolled is also discussed.   [name] is reminded of the importance of daily foot exam, annual eye examination, and good blood sugar, blood pressure and cholesterol control.  Diabetic Labs Latest Ref Rng & Units 10/22/2016 07/19/2016 07/18/2016 06/25/2016 03/26/2016  HbA1c <5.7 % 9.4(H) 8.4(H) - 8.7(H) 8.5(H)  Microalbumin Not estab mg/dL - - - - -  Micro/Creat Ratio <30 mcg/mg creat - - - - -  Chol 0 - 200 mg/dL - 148 - - 134  HDL >40 mg/dL - 63 - - 56  Calc LDL 0 - 99 mg/dL - 78 - - 60  Triglycerides <150 mg/dL - 36 - - 90  Creatinine 0.50 - 0.99 mg/dL 1.04(H) 1.07(H) 1.35(H) 1.39(H) 1.25(H)   BP/Weight 10/23/2016 07/29/2016 07/21/2016 07/19/2016 07/18/2016 07/01/2016 11/94/1740  Systolic BP 814 481 856 - - 314 970  Diastolic BP 80 72 46 - - 82 82  Wt. (Lbs) 245 245.8 - 251.3 - 249 252  BMI 43.4 43.54 - - 44.52 44.11 44.64  Some encounter information is confidential and restricted. Go to Review Flowsheets activity to see all data.   Foot/eye exam completion dates Latest Ref Rng & Units 07/01/2016 06/06/2016  Eye Exam No Retinopathy - No Retinopathy  Foot exam Order - - -  Foot Form Completion - Done -

## 2016-10-23 NOTE — Patient Instructions (Addendum)
F/u in 3 month, call if you need me sooner   Blood sugar is too high  INCREASE glipizide to 10 mg TWO every morning with breakfast, continue lantus 15 units daily  You need to  Test sugar at least 2 times daily, preferably 3 times, before breakfast , lunch and at bedtime Goal for fasting blood sugar ranges from 100 to 130 and 2 hours after any meal or at bedtime should be between 140 to 180.  NEED to quit smoking  You will be referred  To thoracic surgeon and for mammogram   Fasting lipid, cmp and eGFR, hBA1C, tSH 1 week before next appointment  Thank you  for choosing [facility]. We consider it a privelige to serve you.  Delivering excellent health care in a caring and  compassionate way is our goal.  Partnering with you,  so that together we can achieve this goal is our strategy.

## 2016-10-24 ENCOUNTER — Encounter (HOSPITAL_COMMUNITY): Payer: Self-pay

## 2016-10-24 ENCOUNTER — Inpatient Hospital Stay (HOSPITAL_COMMUNITY)
Admission: EM | Admit: 2016-10-24 | Discharge: 2016-10-27 | DRG: 190 | Disposition: A | Discharge status: Home / Self Care | Payer: Medicare Other | Attending: Family Medicine | Admitting: Family Medicine

## 2016-10-24 ENCOUNTER — Emergency Department (HOSPITAL_COMMUNITY): Payer: Medicare Other

## 2016-10-24 DIAGNOSIS — I10 Essential (primary) hypertension: Secondary | ICD-10-CM | POA: Diagnosis present

## 2016-10-24 DIAGNOSIS — F17210 Nicotine dependence, cigarettes, uncomplicated: Secondary | ICD-10-CM | POA: Diagnosis present

## 2016-10-24 DIAGNOSIS — E1065 Type 1 diabetes mellitus with hyperglycemia: Secondary | ICD-10-CM | POA: Diagnosis present

## 2016-10-24 DIAGNOSIS — Z6841 Body Mass Index (BMI) 40.0 and over, adult: Secondary | ICD-10-CM

## 2016-10-24 DIAGNOSIS — F17200 Nicotine dependence, unspecified, uncomplicated: Secondary | ICD-10-CM | POA: Diagnosis present

## 2016-10-24 DIAGNOSIS — R2681 Unsteadiness on feet: Secondary | ICD-10-CM | POA: Diagnosis present

## 2016-10-24 DIAGNOSIS — E1021 Type 1 diabetes mellitus with diabetic nephropathy: Secondary | ICD-10-CM | POA: Diagnosis present

## 2016-10-24 DIAGNOSIS — Z794 Long term (current) use of insulin: Secondary | ICD-10-CM

## 2016-10-24 DIAGNOSIS — T380X5A Adverse effect of glucocorticoids and synthetic analogues, initial encounter: Secondary | ICD-10-CM | POA: Diagnosis present

## 2016-10-24 DIAGNOSIS — Z9981 Dependence on supplemental oxygen: Secondary | ICD-10-CM | POA: Diagnosis not present

## 2016-10-24 DIAGNOSIS — F419 Anxiety disorder, unspecified: Secondary | ICD-10-CM | POA: Diagnosis present

## 2016-10-24 DIAGNOSIS — Z66 Do not resuscitate: Secondary | ICD-10-CM | POA: Diagnosis present

## 2016-10-24 DIAGNOSIS — IMO0002 Reserved for concepts with insufficient information to code with codable children: Secondary | ICD-10-CM

## 2016-10-24 DIAGNOSIS — J9611 Chronic respiratory failure with hypoxia: Secondary | ICD-10-CM | POA: Diagnosis present

## 2016-10-24 DIAGNOSIS — R9402 Abnormal brain scan: Secondary | ICD-10-CM | POA: Diagnosis present

## 2016-10-24 DIAGNOSIS — Z7951 Long term (current) use of inhaled steroids: Secondary | ICD-10-CM

## 2016-10-24 DIAGNOSIS — J441 Chronic obstructive pulmonary disease with (acute) exacerbation: Principal | ICD-10-CM | POA: Diagnosis present

## 2016-10-24 DIAGNOSIS — E0865 Diabetes mellitus due to underlying condition with hyperglycemia: Secondary | ICD-10-CM

## 2016-10-24 DIAGNOSIS — H9190 Unspecified hearing loss, unspecified ear: Secondary | ICD-10-CM | POA: Diagnosis present

## 2016-10-24 DIAGNOSIS — J9621 Acute and chronic respiratory failure with hypoxia: Secondary | ICD-10-CM | POA: Diagnosis present

## 2016-10-24 DIAGNOSIS — E890 Postprocedural hypothyroidism: Secondary | ICD-10-CM | POA: Diagnosis not present

## 2016-10-24 DIAGNOSIS — K219 Gastro-esophageal reflux disease without esophagitis: Secondary | ICD-10-CM | POA: Diagnosis present

## 2016-10-24 DIAGNOSIS — Z885 Allergy status to narcotic agent status: Secondary | ICD-10-CM

## 2016-10-24 DIAGNOSIS — R0682 Tachypnea, not elsewhere classified: Secondary | ICD-10-CM | POA: Diagnosis not present

## 2016-10-24 DIAGNOSIS — E1165 Type 2 diabetes mellitus with hyperglycemia: Secondary | ICD-10-CM

## 2016-10-24 DIAGNOSIS — F329 Major depressive disorder, single episode, unspecified: Secondary | ICD-10-CM | POA: Diagnosis present

## 2016-10-24 DIAGNOSIS — E039 Hypothyroidism, unspecified: Secondary | ICD-10-CM | POA: Diagnosis present

## 2016-10-24 DIAGNOSIS — J8410 Pulmonary fibrosis, unspecified: Secondary | ICD-10-CM | POA: Diagnosis present

## 2016-10-24 DIAGNOSIS — E1121 Type 2 diabetes mellitus with diabetic nephropathy: Secondary | ICD-10-CM | POA: Diagnosis present

## 2016-10-24 DIAGNOSIS — Z9111 Patient's noncompliance with dietary regimen: Secondary | ICD-10-CM

## 2016-10-24 DIAGNOSIS — Z9119 Patient's noncompliance with other medical treatment and regimen: Secondary | ICD-10-CM

## 2016-10-24 DIAGNOSIS — R0602 Shortness of breath: Secondary | ICD-10-CM | POA: Diagnosis not present

## 2016-10-24 DIAGNOSIS — E785 Hyperlipidemia, unspecified: Secondary | ICD-10-CM | POA: Diagnosis present

## 2016-10-24 DIAGNOSIS — E0821 Diabetes mellitus due to underlying condition with diabetic nephropathy: Secondary | ICD-10-CM

## 2016-10-24 LAB — CBC WITH DIFFERENTIAL/PLATELET
BASOS ABS: 0.1 10*3/uL (ref 0.0–0.1)
BASOS PCT: 1 %
EOS PCT: 7 %
Eosinophils Absolute: 0.7 10*3/uL (ref 0.0–0.7)
HCT: 39.8 % (ref 36.0–46.0)
Hemoglobin: 12.5 g/dL (ref 12.0–15.0)
Lymphocytes Relative: 17 %
Lymphs Abs: 1.6 10*3/uL (ref 0.7–4.0)
MCH: 29.6 pg (ref 26.0–34.0)
MCHC: 31.4 g/dL (ref 30.0–36.0)
MCV: 94.3 fL (ref 78.0–100.0)
MONO ABS: 0.7 10*3/uL (ref 0.1–1.0)
Monocytes Relative: 7 %
Neutro Abs: 6.6 10*3/uL (ref 1.7–7.7)
Neutrophils Relative %: 68 %
PLATELETS: 240 10*3/uL (ref 150–400)
RBC: 4.22 MIL/uL (ref 3.87–5.11)
RDW: 14.1 % (ref 11.5–15.5)
WBC: 9.6 10*3/uL (ref 4.0–10.5)

## 2016-10-24 MED ORDER — IPRATROPIUM-ALBUTEROL 0.5-2.5 (3) MG/3ML IN SOLN
3.0000 mL | Freq: Once | RESPIRATORY_TRACT | Status: AC
  Administered 2016-10-24: 3 mL via RESPIRATORY_TRACT
  Filled 2016-10-24: qty 3

## 2016-10-24 MED ORDER — ALBUTEROL SULFATE (2.5 MG/3ML) 0.083% IN NEBU
2.5000 mg | INHALATION_SOLUTION | Freq: Once | RESPIRATORY_TRACT | Status: AC
  Administered 2016-10-24: 2.5 mg via RESPIRATORY_TRACT
  Filled 2016-10-24: qty 3

## 2016-10-24 NOTE — ED Provider Notes (Signed)
[facility] DEPT Provider Note   CSN: [id number] Arrival date & time: 10/24/16  2209  By signing my name below, I, Mayer Masker, attest that this documentation has been prepared under the direction and in the presence of Pollina, Gwenyth Allegra, *. Electronically Signed: Mayer Masker, Scribe. 10/24/16. 11:57 PM.   History   Chief Complaint Chief Complaint  Patient presents with  . Shortness of Breath   The history is provided by the patient. No language interpreter was used.   HPI Comments: [name] is a 66 y.o. female with a PMHx of COPD and pulmonary fibrosis who presents to the Emergency Department complaining of constant, gradually worsening SOB that occurred prior to arrival. She has associated dizziness, productive cough, CP on left side that is worsened by walking, and bilateral leg swelling. She has nebulizer treatments at home with intermittent relief and is on O2 at home all the time. She denies fever. She states she was recently diagnosed with a node in right chest/lobe/lung.  Past Medical History:  Diagnosis Date  . Anxiety   . COPD (chronic obstructive pulmonary disease) ([place])   . Depression 2008  . Diabetes mellitus, type 1 1983   type 2 diabetes  . DJD (degenerative joint disease)    of the spine   . GERD (gastroesophageal reflux disease)   . Hyperlipidemia 2007  . Hypertension 1983  . Hypothyroidism 2004   left thyroidectomy  . Insomnia   . Obesity     Patient Active Problem List   Diagnosis Date Noted  . Hospital discharge follow-up 08/10/2016  . Abnormal brain scan 08/10/2016  . Altered mental status   . Acute respiratory failure with hypoxia ([place]) 07/18/2016  . COPD with acute exacerbation ([place]) 07/18/2016  . Weakness generalized 07/18/2016  . Elevated troponin 07/18/2016  . At high risk for injury related to fall 05/29/2016  . Unsteady gait 05/29/2016  . Anemia, deficiency 01/01/2016  . Back spasm 10/01/2015  . DNR no code (do not  resuscitate) 06/18/2015  . Excessive daytime sleepiness 12/30/2014  . Abnormal mammogram 12/30/2014  . Auditory hallucinations 12/18/2013  . Chronic respiratory failure with hypoxia ([place]) 12/15/2013  . Hearing loss 12020-12-212  . Oxygen dependent 11/19/2012  . Carotid bruit 04/12/2012  . Uncontrolled diabetes mellitus with diabetic nephropathy ([place]) 08/04/2011  . Hyperkalemia 03/14/2010  . FATIGUE 02/20/2009  . NICOTINE ADDICTION 12/31/2008  . ALLERGIC RHINITIS CAUSE UNSPECIFIED 10/07/2008  . Essential hypertension 03/07/2008  . UNSPECIFIED PERIPHERAL VASCULAR DISEASE 03/06/2008  . Hypothyroidism 09/08/2007  . Hyperlipidemia LDL goal <100 09/08/2007  . Morbid obesity ([place]) 09/08/2007  . Anxiety and depression 09/08/2007  . COPD, severe ([place]) 09/08/2007  . GERD 09/08/2007  . Osteoarthritis of spine 09/08/2007  . Insomnia 09/08/2007    Past Surgical History:  Procedure Laterality Date  . APPENDECTOMY    . bilatal great toenail removed , complicated by MRSA in rigt toenail  May 2011  . TONSILLECTOMY  childhood   . TOTAL ABDOMINAL HYSTERECTOMY W/ BILATERAL SALPINGOOPHORECTOMY    . tracheotomy secondary to failed attempt and lung biopsy      OB History    No data available       Home Medications    Prior to Admission medications   Medication Sig Start Date End Date Taking? Authorizing Provider  acetaminophen (TYLENOL) 325 MG tablet Take 650 mg by mouth every 6 (six) hours as needed.    [provider]  ALPRAZolam Duanne Moron) 1 MG tablet TAKE ONE TABLET FOUR TIMES  A DAY 10/08/16   Cloria Spring, MD  amLODipine (NORVASC) 5 MG tablet take 1 tablet by mouth once daily 05/01/16   Fayrene Helper, MD  aspirin (ASPIRIN LOW DOSE) 81 MG EC tablet Take 81 mg by mouth daily.      [provider]  B-D INS SYR ULTRAFINE 1CC/31G 31G X 5/16" 1 ML MISC use as directed FOR ONCE DAILY TESTING 09/02/12   Fayrene Helper, MD  calcium carbonate (TUMS) 500 MG chewable tablet  Chew 1 tablet by mouth as needed.      [provider]  cloNIDine (CATAPRES) 0.2 MG tablet take 1 tablet by mouth at bedtime 05/01/16   Fayrene Helper, MD  esomeprazole (NEXIUM) 40 MG capsule take 1 capsule by mouth once daily BEFORE BREAKFAST 07/01/16   Fayrene Helper, MD  ferrous sulfate 325 (65 FE) MG tablet Take 325 mg by mouth daily with breakfast.    [provider]  FLUoxetine (PROZAC) 20 MG capsule Take two in the am and one in the evening 10/08/16   Cloria Spring, MD  Fluticasone-Salmeterol (ADVAIR DISKUS) 250-50 MCG/DOSE AEPB inhale 1 dose by mouth twice a day 04/09/15   Fayrene Helper, MD  folic acid (FOLVITE) 476 MCG tablet Take 400 mcg by mouth daily.    [provider]  furosemide (LASIX) 40 MG tablet take 1 tablet by mouth once daily 07/07/16   Fayrene Helper, MD  gabapentin (NEURONTIN) 300 MG capsule Take 1 capsule (300 mg total) by mouth at bedtime. 07/01/16   Fayrene Helper, MD  glipiZIDE (GLIPIZIDE XL) 10 MG 24 hr tablet Two tablets once daily with breakfast 10/23/16   Fayrene Helper, MD  Insulin Glargine (LANTUS SOLOSTAR) 100 UNIT/ML Solostar Pen Inject 30 Units into the skin daily at 10 pm. 07/29/16   Fayrene Helper, MD  Insulin Pen Needle 31G X 5 MM MISC Use once daily to inject insulin dx E11.65 08/12/16   Fayrene Helper, MD  ipratropium-albuterol (DUONEB) 0.5-2.5 (3) MG/3ML SOLN Take 3 mLs by nebulization 4 (four) times daily as needed.     [provider]  levalbuterol Penne Lash HFA) 45 MCG/ACT inhaler Inhale 1-2 puffs into the lungs as needed.      [provider]  levothyroxine (SYNTHROID, LEVOTHROID) 100 MCG tablet take 1 tablet by mouth once daily 06/03/16   Fayrene Helper, MD  lisinopril-hydrochlorothiazide [facility]) 20-12.5 MG tablet take 1 tablet by mouth once daily 05/01/16   Fayrene Helper, MD  polyethylene glycol [facility] / Floria Raveling) packet Take 17 g by mouth daily as  needed for mild constipation. 07/21/16   Orvan Falconer, MD  potassium chloride 20 MEQ/15ML (10%) solution TAKE 1 AND 1/2 TEASPOONFUL BY MOUTH ONCE DAILY    Fayrene Helper, MD  rosuvastatin (CRESTOR) 20 MG tablet take 1 tablet by mouth once daily 05/01/16   Fayrene Helper, MD  tiotropium (SPIRIVA) 18 MCG inhalation capsule Place 1 capsule (18 mcg total) into inhaler and inhale daily. 07/22/16   Orvan Falconer, MD  Vitamin D, Ergocalciferol, (DRISDOL) 50000 units CAPS capsule take 1 capsule by mouth EVERY 7 DAYS 07/07/16   Fayrene Helper, MD    Family History Family History  Problem Relation Age of Onset  . Hypertension Mother   . Stroke Mother   . Cancer Father        lung   . Hypertension Sister   . Depression Sister   . Alcohol abuse  Sister   . Hypertension Sister   . Depression Sister   . Cancer Sister   . Diabetes Sister   . Depression Sister   . Cancer Sister        renal   . Kidney failure Brother   . Kidney failure Unknown        family history   . Hypertension Unknown        family history     Social History Social History  Substance Use Topics  . Smoking status: Current Every Day Smoker    Packs/day: 1.00    Types: Cigarettes  . Smokeless tobacco: Never Used  . Alcohol use No     Allergies   Codeine   Review of Systems Review of Systems  Respiratory: Positive for cough (productive) and shortness of breath.   Cardiovascular: Positive for chest pain and leg swelling.  Neurological: Positive for dizziness.  All other systems reviewed and are negative.    Physical Exam Updated Vital Signs BP (!) 150/89   Pulse 100   Resp 19   Ht 5\' 3"  (1.6 m)   Wt 111.1 kg (245 lb)   SpO2 95%   BMI 43.40 kg/m   Physical Exam  Constitutional: She is oriented to person, place, and time. She appears well-developed and well-nourished. No distress.  HENT:  Head: Normocephalic and atraumatic.  Right Ear: Hearing normal.  Left Ear: Hearing normal.  Nose: Nose  normal.  Mouth/Throat: Oropharynx is clear and moist and mucous membranes are normal.  Eyes: Conjunctivae and EOM are normal. Pupils are equal, round, and reactive to light.  Neck: Normal range of motion. Neck supple.  Cardiovascular: Regular rhythm, S1 normal and S2 normal.  Exam reveals no gallop and no friction rub.   No murmur heard. Tachycardic   Pulmonary/Chest: Effort normal and breath sounds normal. No respiratory distress. She exhibits no tenderness.  Pulmonary decreased breath sounds with rhonchi bilaterally  Abdominal: Soft. Normal appearance and bowel sounds are normal. There is no hepatosplenomegaly. There is no tenderness. There is no rebound, no guarding, no tenderness at McBurney's point and negative Murphy's sign. No hernia.  Musculoskeletal: Normal range of motion.  Neurological: She is alert and oriented to person, place, and time. She has normal strength. No cranial nerve deficit or sensory deficit. Coordination normal. GCS eye subscore is 4. GCS verbal subscore is 5. GCS motor subscore is 6.  Skin: Skin is warm, dry and intact. No rash noted. No cyanosis.  1+ pitting edema bilaterally in lower extremities   Psychiatric: She has a normal mood and affect. Her speech is normal and behavior is normal. Thought content normal.  Nursing note and vitals reviewed.    ED Treatments / Results  DIAGNOSTIC STUDIES: Oxygen Saturation is 95% on [place], normal by my interpretation.    COORDINATION OF CARE: 11:57 PM Discussed treatment plan with pt at bedside and pt agreed to plan.   Labs (all labs ordered are listed, but only abnormal results are displayed) Labs Reviewed  COMPREHENSIVE METABOLIC PANEL - Abnormal; Notable for the following:       Result Value   Glucose, Bld 107 (*)    Creatinine, Ser 1.08 (*)    ALT 11 (*)    GFR calc non Af Amer 53 (*)    All other components within normal limits  TROPONIN I - Abnormal; Notable for the following:    Troponin I 0.03 (*)     All other components within normal limits  BLOOD GAS, ARTERIAL - Abnormal; Notable for the following:    pCO2 arterial 53.1 (*)    pO2, Arterial 65.6 (*)    Acid-Base Excess 3.6 (*)    All other components within normal limits  CBC WITH DIFFERENTIAL/PLATELET  BRAIN NATRIURETIC PEPTIDE    EKG  EKG Interpretation  Date/Time:  Friday Oct 24 2016 22:16:43 EDT Ventricular Rate:  111 PR Interval:    QRS Duration: 91 QT Interval:  335 QTC Calculation: 456 R Axis:   -155 Text Interpretation:  Sinus tachycardia Right axis deviation Low voltage, precordial leads Baseline wander in lead(s) V5 No significant change since last tracing Confirmed by Orpah Greek [phone]) on 10/24/2016 11:54:46 PM       Radiology Dg Chest 2 View  Result Date: 10/24/2016 CLINICAL DATA:  Acute onset of shortness of breath and generalized chest heaviness. Initial encounter. EXAM: CHEST  2 VIEW COMPARISON:  Chest radiograph and CT of the chest performed 07/18/2016 FINDINGS: Changes of pulmonary fibrosis are again noted. No definite superimposed focal airspace consolidation is seen. No pleural effusion or pneumothorax is identified. The heart is mildly enlarged. No acute osseous abnormalities are seen. IMPRESSION: Changes of pulmonary fibrosis again noted. No definite superimposed focal airspace consolidation seen at this time. Mild cardiomegaly. Electronically Signed   By: Garald Balding M.D.   On: 10/24/2016 23:38    Procedures Procedures (including critical care time)  Medications Ordered in ED Medications  methylPREDNISolone sodium succinate (SOLU-MEDROL) 125 mg/2 mL injection 125 mg (not administered)  albuterol (PROVENTIL,VENTOLIN) solution continuous neb (not administered)  levofloxacin (LEVAQUIN) IVPB 750 mg (not administered)  ipratropium-albuterol (DUONEB) 0.5-2.5 (3) MG/3ML nebulizer solution 3 mL (3 mLs Nebulization Given 10/24/16 2220)  albuterol (PROVENTIL) (2.5 MG/3ML) 0.083% nebulizer  solution 2.5 mg (2.5 mg Nebulization Given 10/24/16 2220)     Initial Impression / Assessment and Plan / ED Course  I have reviewed the triage vital signs and the nursing notes.  Pertinent labs & imaging results that were available during my care of the patient were reviewed by me and considered in my medical decision making (see chart for details).     Patient presents to the emergency department from home with complaints of cough and shortness of breath. Patient has a cough that has been productive of thick sputum. She has a history of COPD. She is normally on 3 L continuously. Patient requiring 12 L here in the ER to maintain her oxygen saturation. Blood gas does reveal hypoxia with only slight COPD retention of 53.1. She has some increased work of breathing but is able to talk in complete sentences currently.  Bloodwork is normal otherwise. She did have some chest pain earlier, troponin is 0.03. EKG does not show evidence of ischemia or infarct. Will require further monitoring, although ACS is felt to be unlikely.  Patient administered albuterol nebulizer with Atrovent initially. She is still having oxygen requirement and wheezing. Patient now receiving a continuous albuterol nebulizer treatment. She was given IV Solu-Medrol. She does have significant fibrotic changes on x-ray, cannot rule out pneumonia despite normal x-ray. Patient was administered IV Levaquin. She'll require hospitalization for her increased oxygen demand.  Final Clinical Impressions(s) / ED Diagnoses   Final diagnoses:  COPD exacerbation ([place])    New Prescriptions New Prescriptions   No medications on file  I personally performed the services described in this documentation, which was scribed in my presence. The recorded information has been reviewed and is accurate.     [place],  Gwenyth Allegra, MD 10/25/16 [phone]

## 2016-10-24 NOTE — ED Notes (Signed)
Pt O2 sats upon arrival were 60% . Placed pt on 100% NRB and sats increased to normal levels. RT called for breathing treatment. Will continue to monitor.

## 2016-10-24 NOTE — Progress Notes (Signed)
Patient taken off 100 % NRB and breathing tx given. Patient placed on a 12L HFNC. Will tolerate as tolerated. RT will continue to monitor.

## 2016-10-24 NOTE — ED Triage Notes (Signed)
Pt with history of copd, uses nebs at home, states she became sob approx 30 minutes prior to calling ems.  Pt also admits to having some chest heaviness, no chest pain at this time.

## 2016-10-25 ENCOUNTER — Encounter (HOSPITAL_COMMUNITY): Payer: Self-pay | Admitting: *Deleted

## 2016-10-25 DIAGNOSIS — J441 Chronic obstructive pulmonary disease with (acute) exacerbation: Secondary | ICD-10-CM | POA: Diagnosis not present

## 2016-10-25 DIAGNOSIS — Z9119 Patient's noncompliance with other medical treatment and regimen: Secondary | ICD-10-CM | POA: Diagnosis not present

## 2016-10-25 DIAGNOSIS — R9402 Abnormal brain scan: Secondary | ICD-10-CM

## 2016-10-25 DIAGNOSIS — R0602 Shortness of breath: Secondary | ICD-10-CM | POA: Diagnosis not present

## 2016-10-25 DIAGNOSIS — J9611 Chronic respiratory failure with hypoxia: Secondary | ICD-10-CM

## 2016-10-25 DIAGNOSIS — E890 Postprocedural hypothyroidism: Secondary | ICD-10-CM | POA: Diagnosis present

## 2016-10-25 DIAGNOSIS — Z9111 Patient's noncompliance with dietary regimen: Secondary | ICD-10-CM | POA: Diagnosis not present

## 2016-10-25 DIAGNOSIS — J8410 Pulmonary fibrosis, unspecified: Secondary | ICD-10-CM | POA: Diagnosis present

## 2016-10-25 DIAGNOSIS — E1021 Type 1 diabetes mellitus with diabetic nephropathy: Secondary | ICD-10-CM | POA: Diagnosis present

## 2016-10-25 DIAGNOSIS — T380X5A Adverse effect of glucocorticoids and synthetic analogues, initial encounter: Secondary | ICD-10-CM | POA: Diagnosis present

## 2016-10-25 DIAGNOSIS — H9190 Unspecified hearing loss, unspecified ear: Secondary | ICD-10-CM | POA: Diagnosis present

## 2016-10-25 DIAGNOSIS — E785 Hyperlipidemia, unspecified: Secondary | ICD-10-CM | POA: Diagnosis present

## 2016-10-25 DIAGNOSIS — R2681 Unsteadiness on feet: Secondary | ICD-10-CM | POA: Diagnosis present

## 2016-10-25 DIAGNOSIS — F419 Anxiety disorder, unspecified: Secondary | ICD-10-CM | POA: Diagnosis present

## 2016-10-25 DIAGNOSIS — K219 Gastro-esophageal reflux disease without esophagitis: Secondary | ICD-10-CM | POA: Diagnosis present

## 2016-10-25 DIAGNOSIS — Z6841 Body Mass Index (BMI) 40.0 and over, adult: Secondary | ICD-10-CM | POA: Diagnosis not present

## 2016-10-25 DIAGNOSIS — Z7951 Long term (current) use of inhaled steroids: Secondary | ICD-10-CM | POA: Diagnosis not present

## 2016-10-25 DIAGNOSIS — J9621 Acute and chronic respiratory failure with hypoxia: Secondary | ICD-10-CM | POA: Diagnosis present

## 2016-10-25 DIAGNOSIS — Z794 Long term (current) use of insulin: Secondary | ICD-10-CM | POA: Diagnosis not present

## 2016-10-25 DIAGNOSIS — E1065 Type 1 diabetes mellitus with hyperglycemia: Secondary | ICD-10-CM | POA: Diagnosis present

## 2016-10-25 DIAGNOSIS — Z66 Do not resuscitate: Secondary | ICD-10-CM | POA: Diagnosis not present

## 2016-10-25 DIAGNOSIS — Z9981 Dependence on supplemental oxygen: Secondary | ICD-10-CM | POA: Diagnosis not present

## 2016-10-25 DIAGNOSIS — I10 Essential (primary) hypertension: Secondary | ICD-10-CM | POA: Diagnosis not present

## 2016-10-25 DIAGNOSIS — F329 Major depressive disorder, single episode, unspecified: Secondary | ICD-10-CM | POA: Diagnosis present

## 2016-10-25 DIAGNOSIS — F17210 Nicotine dependence, cigarettes, uncomplicated: Secondary | ICD-10-CM | POA: Diagnosis present

## 2016-10-25 LAB — COMPREHENSIVE METABOLIC PANEL
ALBUMIN: 3.8 g/dL (ref 3.5–5.0)
ALT: 11 U/L — ABNORMAL LOW (ref 14–54)
AST: 21 U/L (ref 15–41)
Alkaline Phosphatase: 88 U/L (ref 38–126)
Anion gap: 9 (ref 5–15)
BUN: 19 mg/dL (ref 6–20)
CHLORIDE: 102 mmol/L (ref 101–111)
CO2: 28 mmol/L (ref 22–32)
CREATININE: 1.08 mg/dL — AB (ref 0.44–1.00)
Calcium: 9.3 mg/dL (ref 8.9–10.3)
GFR calc Af Amer: >60 mL/min (ref >60)
GFR calc non Af Amer: 53 mL/min — ABNORMAL LOW (ref >60)
GLUCOSE: 107 mg/dL — AB (ref 65–99)
POTASSIUM: 4 mmol/L (ref 3.5–5.1)
SODIUM: 139 mmol/L (ref 135–145)
Total Bilirubin: 0.4 mg/dL (ref 0.3–1.2)
Total Protein: 7.7 g/dL (ref 6.5–8.1)

## 2016-10-25 LAB — BLOOD GAS, ARTERIAL
ACID-BASE EXCESS: 3.6 mmol/L — AB (ref 0.0–2.0)
Bicarbonate: 26.8 mmol/L (ref 20.0–28.0)
Drawn by: 317771
O2 Content: 12 L/min
O2 Saturation: 90.5 %
PCO2 ART: 53.1 mmHg — AB (ref 32.0–48.0)
pH, Arterial: 7.352 (ref 7.350–7.450)
pO2, Arterial: 65.6 mmHg — ABNORMAL LOW (ref 83.0–108.0)

## 2016-10-25 LAB — CBC
HEMATOCRIT: 37 % (ref 36.0–46.0)
HEMOGLOBIN: 11.5 g/dL — AB (ref 12.0–15.0)
MCH: 29.4 pg (ref 26.0–34.0)
MCHC: 31.1 g/dL (ref 30.0–36.0)
MCV: 94.6 fL (ref 78.0–100.0)
Platelets: 238 10*3/uL (ref 150–400)
RBC: 3.91 MIL/uL (ref 3.87–5.11)
RDW: 14.1 % (ref 11.5–15.5)
WBC: 10.5 10*3/uL (ref 4.0–10.5)

## 2016-10-25 LAB — BASIC METABOLIC PANEL
Anion gap: 8 (ref 5–15)
BUN: 21 mg/dL — ABNORMAL HIGH (ref 6–20)
CALCIUM: 8.7 mg/dL — AB (ref 8.9–10.3)
CO2: 27 mmol/L (ref 22–32)
Chloride: 101 mmol/L (ref 101–111)
Creatinine, Ser: 1.18 mg/dL — ABNORMAL HIGH (ref 0.44–1.00)
GFR calc Af Amer: 55 mL/min — ABNORMAL LOW (ref >60)
GFR calc non Af Amer: 48 mL/min — ABNORMAL LOW (ref >60)
Glucose, Bld: 215 mg/dL — ABNORMAL HIGH (ref 65–99)
Potassium: 3.9 mmol/L (ref 3.5–5.1)
SODIUM: 136 mmol/L (ref 135–145)

## 2016-10-25 LAB — GLUCOSE, CAPILLARY
Glucose-Capillary: 380 mg/dL — ABNORMAL HIGH (ref 65–99)
Glucose-Capillary: 395 mg/dL — ABNORMAL HIGH (ref 65–99)
Glucose-Capillary: 479 mg/dL — ABNORMAL HIGH (ref 65–99)
Glucose-Capillary: 369 mg/dL — ABNORMAL HIGH (ref 65–99)

## 2016-10-25 LAB — TSH: TSH: 0.777 u[IU]/mL (ref 0.350–4.500)

## 2016-10-25 LAB — TROPONIN I: TROPONIN I: 0.03 ng/mL — AB (ref <0.03)

## 2016-10-25 LAB — BRAIN NATRIURETIC PEPTIDE: B Natriuretic Peptide: 84 pg/mL (ref 0.0–100.0)

## 2016-10-25 MED ORDER — ALPRAZOLAM 1 MG PO TABS
1.0000 mg | ORAL_TABLET | Freq: Two times a day (BID) | ORAL | Status: DC | PRN
  Administered 2016-10-25 – 2016-10-26 (×4): 1 mg via ORAL
  Filled 2016-10-25 (×4): qty 1

## 2016-10-25 MED ORDER — ENOXAPARIN SODIUM 40 MG/0.4ML ~~LOC~~ SOLN
40.0000 mg | SUBCUTANEOUS | Status: DC
  Administered 2016-10-26 – 2016-10-27 (×2): 40 mg via SUBCUTANEOUS
  Filled 2016-10-25 (×2): qty 0.4

## 2016-10-25 MED ORDER — FERROUS SULFATE 325 (65 FE) MG PO TABS
325.0000 mg | ORAL_TABLET | Freq: Every day | ORAL | Status: DC
  Administered 2016-10-25 – 2016-10-27 (×3): 325 mg via ORAL
  Filled 2016-10-25 (×3): qty 1

## 2016-10-25 MED ORDER — LISINOPRIL-HYDROCHLOROTHIAZIDE 20-12.5 MG PO TABS: 1.0000 | ORAL_TABLET | Freq: Every day | ORAL | Status: DC

## 2016-10-25 MED ORDER — PANTOPRAZOLE SODIUM 40 MG PO TBEC
40.0000 mg | DELAYED_RELEASE_TABLET | Freq: Every day | ORAL | Status: DC
  Administered 2016-10-25 – 2016-10-27 (×3): 40 mg via ORAL
  Filled 2016-10-25 (×3): qty 1

## 2016-10-25 MED ORDER — INSULIN ASPART 100 UNIT/ML ~~LOC~~ SOLN
0.0000 [IU] | Freq: Every day | SUBCUTANEOUS | Status: DC
  Administered 2016-10-25: 5 [IU] via SUBCUTANEOUS
  Administered 2016-10-26: 2 [IU] via SUBCUTANEOUS

## 2016-10-25 MED ORDER — POLYETHYLENE GLYCOL 3350 17 G PO PACK: 17.0000 g | PACK | Freq: Every day | ORAL | Status: DC | PRN

## 2016-10-25 MED ORDER — MOMETASONE FURO-FORMOTEROL FUM 200-5 MCG/ACT IN AERO
2.0000 | INHALATION_SPRAY | Freq: Two times a day (BID) | RESPIRATORY_TRACT | Status: DC
  Administered 2016-10-25 – 2016-10-27 (×5): 2 via RESPIRATORY_TRACT
  Filled 2016-10-25: qty 8.8

## 2016-10-25 MED ORDER — IPRATROPIUM-ALBUTEROL 0.5-2.5 (3) MG/3ML IN SOLN
3.0000 mL | Freq: Four times a day (QID) | RESPIRATORY_TRACT | Status: DC
  Administered 2016-10-25 – 2016-10-27 (×9): 3 mL via RESPIRATORY_TRACT
  Filled 2016-10-25 (×9): qty 3

## 2016-10-25 MED ORDER — ROSUVASTATIN CALCIUM 20 MG PO TABS
20.0000 mg | ORAL_TABLET | Freq: Every day | ORAL | Status: DC
  Administered 2016-10-25 – 2016-10-27 (×3): 20 mg via ORAL
  Filled 2016-10-25 (×3): qty 1

## 2016-10-25 MED ORDER — FOLIC ACID 0.5 MG HALF TAB
500.0000 ug | ORAL_TABLET | Freq: Every day | ORAL | Status: DC
  Filled 2016-10-25 (×3): qty 1

## 2016-10-25 MED ORDER — LEVOTHYROXINE SODIUM 100 MCG PO TABS
100.0000 ug | ORAL_TABLET | Freq: Every day | ORAL | Status: DC
  Administered 2016-10-25 – 2016-10-27 (×3): 100 ug via ORAL
  Filled 2016-10-25 (×3): qty 1

## 2016-10-25 MED ORDER — ALBUTEROL (5 MG/ML) CONTINUOUS INHALATION SOLN
10.0000 mg/h | INHALATION_SOLUTION | Freq: Once | RESPIRATORY_TRACT | Status: AC
  Administered 2016-10-25: 10 mg/h via RESPIRATORY_TRACT
  Filled 2016-10-25: qty 20

## 2016-10-25 MED ORDER — FLUOXETINE HCL 20 MG PO CAPS
20.0000 mg | ORAL_CAPSULE | Freq: Every day | ORAL | Status: DC
  Administered 2016-10-25 – 2016-10-26 (×2): 20 mg via ORAL
  Filled 2016-10-25 (×2): qty 1

## 2016-10-25 MED ORDER — FLUOXETINE HCL 20 MG PO CAPS
40.0000 mg | ORAL_CAPSULE | Freq: Every morning | ORAL | Status: DC
  Administered 2016-10-25 – 2016-10-27 (×3): 40 mg via ORAL
  Filled 2016-10-25 (×3): qty 2

## 2016-10-25 MED ORDER — ACETAMINOPHEN 325 MG PO TABS
650.0000 mg | ORAL_TABLET | ORAL | Status: DC | PRN
  Administered 2016-10-25 – 2016-10-27 (×7): 650 mg via ORAL
  Filled 2016-10-25 (×6): qty 2

## 2016-10-25 MED ORDER — GABAPENTIN 300 MG PO CAPS
300.0000 mg | ORAL_CAPSULE | Freq: Every day | ORAL | Status: DC
  Administered 2016-10-25 – 2016-10-26 (×2): 300 mg via ORAL
  Filled 2016-10-25 (×2): qty 1

## 2016-10-25 MED ORDER — INSULIN GLARGINE 100 UNIT/ML ~~LOC~~ SOLN
30.0000 [IU] | Freq: Every day | SUBCUTANEOUS | Status: DC
  Filled 2016-10-25 (×2): qty 0.3

## 2016-10-25 MED ORDER — FUROSEMIDE 40 MG PO TABS
40.0000 mg | ORAL_TABLET | Freq: Every day | ORAL | Status: DC
  Administered 2016-10-25 – 2016-10-27 (×3): 40 mg via ORAL
  Filled 2016-10-25 (×3): qty 1

## 2016-10-25 MED ORDER — AMLODIPINE BESYLATE 5 MG PO TABS
5.0000 mg | ORAL_TABLET | Freq: Every day | ORAL | Status: DC
  Administered 2016-10-25 – 2016-10-27 (×3): 5 mg via ORAL
  Filled 2016-10-25 (×3): qty 1

## 2016-10-25 MED ORDER — CLONIDINE HCL 0.2 MG PO TABS
0.2000 mg | ORAL_TABLET | Freq: Every day | ORAL | Status: DC
  Administered 2016-10-25 – 2016-10-26 (×2): 0.2 mg via ORAL
  Filled 2016-10-25 (×2): qty 1

## 2016-10-25 MED ORDER — LEVOFLOXACIN IN D5W 750 MG/150ML IV SOLN
750.0000 mg | Freq: Once | INTRAVENOUS | Status: AC
  Administered 2016-10-25: 750 mg via INTRAVENOUS
  Filled 2016-10-25: qty 150

## 2016-10-25 MED ORDER — POTASSIUM CHLORIDE 20 MEQ/15ML (10%) PO SOLN
20.0000 meq | Freq: Every day | ORAL | Status: DC
  Administered 2016-10-25 – 2016-10-27 (×3): 20 meq via ORAL
  Filled 2016-10-25 (×3): qty 30

## 2016-10-25 MED ORDER — VITAMIN D (ERGOCALCIFEROL) 1.25 MG (50000 UNIT) PO CAPS
50000.0000 [IU] | ORAL_CAPSULE | ORAL | Status: DC
  Administered 2016-10-26: 50000 [IU] via ORAL
  Filled 2016-10-25 (×2): qty 1

## 2016-10-25 MED ORDER — ASPIRIN EC 81 MG PO TBEC
81.0000 mg | DELAYED_RELEASE_TABLET | Freq: Every day | ORAL | Status: DC
  Administered 2016-10-25 – 2016-10-27 (×3): 81 mg via ORAL
  Filled 2016-10-25 (×3): qty 1

## 2016-10-25 MED ORDER — LEVOFLOXACIN IN D5W 750 MG/150ML IV SOLN
750.0000 mg | INTRAVENOUS | Status: DC
  Administered 2016-10-26 – 2016-10-27 (×2): 750 mg via INTRAVENOUS
  Filled 2016-10-25 (×2): qty 150

## 2016-10-25 MED ORDER — METHYLPREDNISOLONE SODIUM SUCC 125 MG IJ SOLR
125.0000 mg | Freq: Once | INTRAMUSCULAR | Status: AC
Start: 2016-10-25 — End: 2016-10-25
  Administered 2016-10-25: 125 mg via INTRAVENOUS
  Filled 2016-10-25: qty 2

## 2016-10-25 MED ORDER — LISINOPRIL 10 MG PO TABS
20.0000 mg | ORAL_TABLET | Freq: Every day | ORAL | Status: DC
  Administered 2016-10-25 – 2016-10-27 (×3): 20 mg via ORAL
  Filled 2016-10-25 (×3): qty 2

## 2016-10-25 MED ORDER — TIOTROPIUM BROMIDE MONOHYDRATE 18 MCG IN CAPS
18.0000 ug | ORAL_CAPSULE | Freq: Every day | RESPIRATORY_TRACT | Status: DC
  Administered 2016-10-25 – 2016-10-27 (×3): 18 ug via RESPIRATORY_TRACT
  Filled 2016-10-25: qty 5

## 2016-10-25 MED ORDER — HYDROCHLOROTHIAZIDE 12.5 MG PO CAPS: 12.5000 mg | ORAL_CAPSULE | Freq: Every day | ORAL | Status: DC

## 2016-10-25 MED ORDER — FOLIC ACID 1 MG PO TABS
0.5000 mg | ORAL_TABLET | Freq: Every day | ORAL | Status: DC
  Administered 2016-10-25 – 2016-10-27 (×3): 0.5 mg via ORAL
  Filled 2016-10-25 (×3): qty 1

## 2016-10-25 MED ORDER — ZOLPIDEM TARTRATE 5 MG PO TABS
5.0000 mg | ORAL_TABLET | Freq: Every evening | ORAL | Status: DC | PRN
  Administered 2016-10-25 – 2016-10-26 (×2): 5 mg via ORAL
  Filled 2016-10-25 (×2): qty 1

## 2016-10-25 MED ORDER — INSULIN GLARGINE 100 UNIT/ML ~~LOC~~ SOLN
38.0000 [IU] | Freq: Every day | SUBCUTANEOUS | Status: DC
  Administered 2016-10-25: 38 [IU] via SUBCUTANEOUS
  Filled 2016-10-25 (×2): qty 0.38

## 2016-10-25 MED ORDER — GLIPIZIDE ER 5 MG PO TB24
10.0000 mg | ORAL_TABLET | Freq: Every day | ORAL | Status: DC
  Administered 2016-10-25 – 2016-10-27 (×3): 10 mg via ORAL
  Filled 2016-10-25 (×3): qty 2

## 2016-10-25 MED ORDER — INSULIN ASPART 100 UNIT/ML ~~LOC~~ SOLN
0.0000 [IU] | Freq: Three times a day (TID) | SUBCUTANEOUS | Status: DC
Start: 2016-10-25 — End: 2016-10-27
  Administered 2016-10-25 (×3): 20 [IU] via SUBCUTANEOUS
  Administered 2016-10-26 (×2): 11 [IU] via SUBCUTANEOUS
  Administered 2016-10-26: 20 [IU] via SUBCUTANEOUS
  Administered 2016-10-27: 11 [IU] via SUBCUTANEOUS

## 2016-10-25 MED ORDER — METHYLPREDNISOLONE SODIUM SUCC 40 MG IJ SOLR
40.0000 mg | Freq: Two times a day (BID) | INTRAMUSCULAR | Status: DC
  Administered 2016-10-26 – 2016-10-27 (×3): 40 mg via INTRAVENOUS
  Filled 2016-10-25 (×3): qty 1

## 2016-10-25 MED ORDER — METHYLPREDNISOLONE SODIUM SUCC 40 MG IJ SOLR
40.0000 mg | Freq: Four times a day (QID) | INTRAMUSCULAR | Status: DC
  Administered 2016-10-25 (×2): 40 mg via INTRAVENOUS
  Filled 2016-10-25 (×2): qty 1

## 2016-10-25 NOTE — Progress Notes (Signed)
Patient seen and evaluated, chart reviewed, please see EMR for updated orders. Please see full H&P dictated by admitting physician for same date of service.   Patient admitted with COPD Exacerbation  Social/Ethics- patient seen with RN at bedside, patient requests DO NOT RESUSCITATE status, patient tells me she has a MOST form at home   Patient seen and evaluated, chart reviewed, please see EMR for updated orders. Please see full H&P dictated by admitting physician for same date of service.

## 2016-10-25 NOTE — Progress Notes (Signed)
Patient requests DNR status. MD aware.

## 2016-10-25 NOTE — Progress Notes (Signed)
Patient requesting soda but refusing to drink diet soda. Patient educated about sugar intake and her blood glucose being elevated today. Patient verbalized understanding but refuses to comply with drinking diet soda. Will continue to monitor.   Celestia Khat, RN

## 2016-10-25 NOTE — H&P (Signed)
History and Physical    [name]:[id number] DOB: 05-06-1951 DOA: 10/24/2016  PCP: Fayrene Helper, MD  Patient coming from:  Home.    Chief Complaint:  Coughs, SOB.   HPI: [name] is an 66 y.o. female with hx of severe COPD on home oxygen, active smoker with no intention to quit, abnormal head MRI with no intention according to her to follow up with neurosurgery, DM, HTN, HLD, hypothyrodisim on supplement, presented to the ER with several days of increase SOB, wheezing, and having non productive coughs.  She has no CP, nausea, vomiting or fever, chills.  Reviewed of her PCP record showed that her PCP felt she should be in assisted living, but she refused.  Last admission, she was acciddentally overdose on Narcotics, and her benzo was decreased.  Work up in the ER included CXR which showed no definite PNA, and serology was unremarkable.  She was given hour nebs, along with IV steroids, and IV levoquin, and hospitalist was asked to admit her for COPD exacerbation which required higher oxygen than her baseline home oxygen level.   She has been a DNR.  ED Course:  See above.  Rewiew of Systems:  Constitutional: Negative for malaise, fever and chills. No significant weight loss or weight gain Eyes: Negative for eye pain, redness and discharge, diplopia, visual changes, or flashes of light. ENMT: Negative for ear pain, hoarseness, nasal congestion, sinus pressure and sore throat. No headaches; tinnitus, drooling, or problem swallowing. Cardiovascular: Negative for chest pain, palpitations, diaphoresis, dyspnea and peripheral edema. ; No orthopnea, PND Respiratory: Negative for stridor. No pleuritic chestpain. Gastrointestinal: Negative for diarrhea, constipation,  melena, blood in stool, hematemesis, jaundice and rectal bleeding.    Genitourinary: Negative for frequency, dysuria, incontinence,flank pain and hematuria; Musculoskeletal: Negative for back pain and neck pain.  Negative for swelling and trauma.;  Skin: . Negative for pruritus, rash, abrasions, bruising and skin lesion.; ulcerations Neuro: Negative for headache, lightheadedness and neck stiffness. Negative for weakness, altered level of consciousness , altered mental status, extremity weakness, burning feet, involuntary movement, seizure and syncope.  Psych: negative for , insomnia, tearfulness, panic attacks, hallucinations, paranoia, suicidal or homicidal ideation    Past Medical History:  Diagnosis Date  . Anxiety   . COPD (chronic obstructive pulmonary disease) ([place])   . Depression 2008  . Diabetes mellitus, type 1 1983   type 2 diabetes  . DJD (degenerative joint disease)    of the spine   . GERD (gastroesophageal reflux disease)   . Hyperlipidemia 2007  . Hypertension 1983  . Hypothyroidism 2004   left thyroidectomy  . Insomnia   . Obesity     Past Surgical History:  Procedure Laterality Date  . APPENDECTOMY    . bilatal great toenail removed , complicated by MRSA in rigt toenail  May 2011  . TONSILLECTOMY  childhood   . TOTAL ABDOMINAL HYSTERECTOMY W/ BILATERAL SALPINGOOPHORECTOMY    . tracheotomy secondary to failed attempt and lung biopsy       reports that she has been smoking Cigarettes.  She has been smoking about 1.00 pack per day. She has never used smokeless tobacco. She reports that she does not drink alcohol or use drugs.  Allergies  Allergen Reactions  . Codeine     REACTION: nausea    Family History  Problem Relation Age of Onset  . Hypertension Mother   . Stroke Mother   . Cancer Father  lung   . Hypertension Sister   . Depression Sister   . Alcohol abuse Sister   . Hypertension Sister   . Depression Sister   . Cancer Sister   . Diabetes Sister   . Depression Sister   . Cancer Sister        renal   . Kidney failure Brother   . Kidney failure Unknown        family history   . Hypertension Unknown        family history      Prior to  Admission medications   Medication Sig Start Date End Date Taking? Authorizing Provider  acetaminophen (TYLENOL) 325 MG tablet Take 650 mg by mouth every 6 (six) hours as needed.    [provider]  ALPRAZolam Duanne Moron) 1 MG tablet TAKE ONE TABLET FOUR TIMES A DAY 10/08/16   Cloria Spring, MD  amLODipine (NORVASC) 5 MG tablet take 1 tablet by mouth once daily 05/01/16   Fayrene Helper, MD  aspirin (ASPIRIN LOW DOSE) 81 MG EC tablet Take 81 mg by mouth daily.      [provider]  B-D INS SYR ULTRAFINE 1CC/31G 31G X 5/16" 1 ML MISC use as directed FOR ONCE DAILY TESTING 09/02/12   Fayrene Helper, MD  calcium carbonate (TUMS) 500 MG chewable tablet Chew 1 tablet by mouth as needed.      [provider]  cloNIDine (CATAPRES) 0.2 MG tablet take 1 tablet by mouth at bedtime 05/01/16   Fayrene Helper, MD  esomeprazole (NEXIUM) 40 MG capsule take 1 capsule by mouth once daily BEFORE BREAKFAST 07/01/16   Fayrene Helper, MD  ferrous sulfate 325 (65 FE) MG tablet Take 325 mg by mouth daily with breakfast.    [provider]  FLUoxetine (PROZAC) 20 MG capsule Take two in the am and one in the evening 10/08/16   Cloria Spring, MD  Fluticasone-Salmeterol (ADVAIR DISKUS) 250-50 MCG/DOSE AEPB inhale 1 dose by mouth twice a day 04/09/15   Fayrene Helper, MD  folic acid (FOLVITE) 297 MCG tablet Take 400 mcg by mouth daily.    [provider]  furosemide (LASIX) 40 MG tablet take 1 tablet by mouth once daily 07/07/16   Fayrene Helper, MD  gabapentin (NEURONTIN) 300 MG capsule Take 1 capsule (300 mg total) by mouth at bedtime. 07/01/16   Fayrene Helper, MD  glipiZIDE (GLIPIZIDE XL) 10 MG 24 hr tablet Two tablets once daily with breakfast 10/23/16   Fayrene Helper, MD  Insulin Glargine (LANTUS SOLOSTAR) 100 UNIT/ML Solostar Pen Inject 30 Units into the skin daily at 10 pm. 07/29/16   Fayrene Helper, MD  Insulin Pen Needle 31G X 5 MM MISC  Use once daily to inject insulin dx E11.65 08/12/16   Fayrene Helper, MD  ipratropium-albuterol (DUONEB) 0.5-2.5 (3) MG/3ML SOLN Take 3 mLs by nebulization 4 (four) times daily as needed.     [provider]  levalbuterol Penne Lash HFA) 45 MCG/ACT inhaler Inhale 1-2 puffs into the lungs as needed.      [provider]  levothyroxine (SYNTHROID, LEVOTHROID) 100 MCG tablet take 1 tablet by mouth once daily 06/03/16   Fayrene Helper, MD  lisinopril-hydrochlorothiazide [facility]) 20-12.5 MG tablet take 1 tablet by mouth once daily 05/01/16   Fayrene Helper, MD  polyethylene glycol [facility] / Floria Raveling) packet Take 17 g by mouth daily as needed for mild constipation. 07/21/16  Orvan Falconer, MD  potassium chloride 20 MEQ/15ML (10%) solution TAKE 1 AND 1/2 TEASPOONFUL BY MOUTH ONCE DAILY    Fayrene Helper, MD  rosuvastatin (CRESTOR) 20 MG tablet take 1 tablet by mouth once daily 05/01/16   Fayrene Helper, MD  tiotropium (SPIRIVA) 18 MCG inhalation capsule Place 1 capsule (18 mcg total) into inhaler and inhale daily. 07/22/16   Orvan Falconer, MD  Vitamin D, Ergocalciferol, (DRISDOL) 50000 units CAPS capsule take 1 capsule by mouth EVERY 7 DAYS 07/07/16   Fayrene Helper, MD    Physical Exam: Vitals:   10/25/16 0030 10/25/16 0045 10/25/16 0100 10/25/16 0115  BP: (!) 150/89  (!) 144/83   Pulse: (!) 104 100 96 95  Resp: 20 19 (!) 22 (!) 29  TempSrc:      SpO2: 100% 95% 100% 100%  Weight:      Height:          Constitutional: NAD, calm, comfortable Vitals:   10/25/16 0030 10/25/16 0045 10/25/16 0100 10/25/16 0115  BP: (!) 150/89  (!) 144/83   Pulse: (!) 104 100 96 95  Resp: 20 19 (!) 22 (!) 29  TempSrc:      SpO2: 100% 95% 100% 100%  Weight:      Height:       Eyes: PERRL, lids and conjunctivae normal ENMT: Mucous membranes are moist. Posterior pharynx clear of any exudate or lesions.Normal dentition.  Neck: normal, supple, no masses, no  thyromegaly Respiratory: wheezing, no crackles. Normal respiratory effort. No accessory muscle use. She is dyspneic. Cardiovascular: Regular rate and rhythm, no murmurs / rubs / gallops. No extremity edema. 2+ pedal pulses. No carotid bruits.  Abdomen: no tenderness, no masses palpated. No hepatosplenomegaly. Bowel sounds positive.  Musculoskeletal: no clubbing / cyanosis. No joint deformity upper and lower extremities. Good ROM, no contractures. Normal muscle tone.  Skin: no rashes, lesions, ulcers. No induration Neurologic: CN 2-12 grossly intact. Sensation intact, DTR normal. Strength 5/5 in all 4.  Psychiatric: Normal judgment and insight. Alert and oriented x 3. Normal mood.    Labs on Admission: I have personally reviewed following labs and imaging studies CBC:  Recent Labs Lab 10/24/16 2314  WBC 9.6  NEUTROABS 6.6  HGB 12.5  HCT 39.8  MCV 94.3  PLT 937   Basic Metabolic Panel:  Recent Labs Lab 10/22/16 1605 10/24/16 2314  NA 141 139  K 3.9 4.0  CL 99 102  CO2 32* 28  GLUCOSE 120* 107*  BUN 14 19  CREATININE 1.04* 1.08*  CALCIUM 9.3 9.3   GFR: Estimated Creatinine Clearance: 63.1 mL/min (A) (by C-G formula based on SCr of 1.08 mg/dL (H)). Liver Function Tests:  Recent Labs Lab 10/24/16 2314  AST 21  ALT 11*  ALKPHOS 88  BILITOT 0.4  PROT 7.7  ALBUMIN 3.8   Cardiac Enzymes:  Recent Labs Lab 10/24/16 2314  TROPONINI 0.03*   HbA1C:  Recent Labs  10/22/16 1605  HGBA1C 9.4*   CBG: Urine analysis:    Component Value Date/Time   COLORURINE STRAW (A) 07/18/2016 1151   APPEARANCEUR CLEAR 07/18/2016 1151   LABSPEC 1.011 07/18/2016 1151   PHURINE 5.0 07/18/2016 1151   GLUCOSEU NEGATIVE 07/18/2016 1151   HGBUR NEGATIVE 07/18/2016 [place] 07/18/2016 1151   KETONESUR NEGATIVE 07/18/2016 1151   PROTEINUR NEGATIVE 07/18/2016 1151   NITRITE NEGATIVE 07/18/2016 1151   LEUKOCYTESUR NEGATIVE 07/18/2016 1151   Radiological Exams  on Admission: Dg Chest 2  View  Result Date: 10/24/2016 CLINICAL DATA:  Acute onset of shortness of breath and generalized chest heaviness. Initial encounter. EXAM: CHEST  2 VIEW COMPARISON:  Chest radiograph and CT of the chest performed 07/18/2016 FINDINGS: Changes of pulmonary fibrosis are again noted. No definite superimposed focal airspace consolidation is seen. No pleural effusion or pneumothorax is identified. The heart is mildly enlarged. No acute osseous abnormalities are seen. IMPRESSION: Changes of pulmonary fibrosis again noted. No definite superimposed focal airspace consolidation seen at this time. Mild cardiomegaly. Electronically Signed   By: Garald Balding M.D.   On: 10/24/2016 23:38   EKG: Independently reviewed.  Assessment/Plan Active Problems:   Hypothyroidism   Hyperlipidemia LDL goal <100   Morbid obesity ([place])   NICOTINE ADDICTION   Essential hypertension   Uncontrolled diabetes mellitus with diabetic nephropathy (HCC)   Oxygen dependent   Chronic respiratory failure with hypoxia (HCC)   DNR no code (do not resuscitate)   Unsteady gait   Abnormal brain scan   COPD exacerbation (HCC)    PLAN:   COPD exacerbation:  WIll continue with IV steroids, IV antibiotics, Nebs, and oxygen.  She is stable and can be admitted to the floor.   Tobacco abuse:  Advised stop, but she has no intention of quitting.  DM:  Will use SSI with resistant scale as she is on IV steroids.  Hypothyroidism:  Will continue with supplement.  Will check TSH.  Abnormal MRI:  I asked her about follow up with neurosurgery, but she doesn't want to and stated she is not going to do it.    DVT prophylaxis: Lovenox.  Code Status: DNR.   Family Communication:  Sister at bedside.  Disposition Plan: To home.  Consults called: None. Admission status: inpatient.    Melynda Krzywicki MD FACP. [facility]  If 7PM-7AM, please contact night-coverage [email or web address] Password [email or web address]  10/25/2016, 2:00  AM

## 2016-10-26 DIAGNOSIS — I10 Essential (primary) hypertension: Secondary | ICD-10-CM

## 2016-10-26 LAB — GLUCOSE, CAPILLARY
GLUCOSE-CAPILLARY: 288 mg/dL — AB (ref 65–99)
Glucose-Capillary: 355 mg/dL — ABNORMAL HIGH (ref 65–99)
Glucose-Capillary: 264 mg/dL — ABNORMAL HIGH (ref 65–99)
Glucose-Capillary: 238 mg/dL — ABNORMAL HIGH (ref 65–99)

## 2016-10-26 LAB — HIV ANTIBODY (ROUTINE TESTING W REFLEX): HIV Screen 4th Generation wRfx: NONREACTIVE

## 2016-10-26 MED ORDER — INSULIN GLARGINE 100 UNIT/ML ~~LOC~~ SOLN
45.0000 [IU] | Freq: Every day | SUBCUTANEOUS | Status: DC
  Administered 2016-10-26: 45 [IU] via SUBCUTANEOUS
  Filled 2016-10-26 (×2): qty 0.45

## 2016-10-26 MED ORDER — INSULIN GLARGINE 100 UNIT/ML ~~LOC~~ SOLN
10.0000 [IU] | Freq: Once | SUBCUTANEOUS | Status: AC
  Administered 2016-10-26: 10 [IU] via SUBCUTANEOUS
  Filled 2016-10-26: qty 0.1

## 2016-10-26 MED ORDER — GUAIFENESIN-DM 100-10 MG/5ML PO SYRP
5.0000 mL | ORAL_SOLUTION | ORAL | Status: DC | PRN
  Administered 2016-10-26 – 2016-10-27 (×5): 5 mL via ORAL
  Filled 2016-10-26 (×5): qty 5

## 2016-10-26 NOTE — Progress Notes (Signed)
Patient Demographics:    [name], is a 66 y.o. female, DOB - 1950-07-16, ZMO:[id number]  Admit date - 10/24/2016   Admitting Physician Orvan Falconer, MD  Outpatient Primary MD for the patient is Fayrene Helper, MD  LOS - 1   Chief Complaint  Patient presents with  . Shortness of Breath        Subjective:    [name] today has no fevers, no emesis,  No chest pain,  Non-compliant with diet, wants soda and sweets   Assessment  & Plan :    Active Problems:   Hypothyroidism   Hyperlipidemia LDL goal <100   Morbid obesity ([place])   NICOTINE ADDICTION   Essential hypertension   Uncontrolled diabetes mellitus with diabetic nephropathy (HCC)   Oxygen dependent   Chronic respiratory failure with hypoxia (HCC)   DNR no code (do not resuscitate)   Unsteady gait   Abnormal brain scan   COPD exacerbation (HCC)  1)Acute COPD/Pulmonary Fibrosis Exacerbation:  Clinically improving, continue treatment oxygen, Solu-Medrol changed to 40 mg every 12 hours, continue bronchodilators, mucolytics, patient is on Levaquin 750 mg daily  2) acute and chronic hypoxic respiratory failure - secondary to #1 above, at baseline patient uses oxygen at home, treat as above in #1   3)Tobacco abuse:  Advised stop, but she has no intention of quitting.  4)DM:  Will use SSI with resistant scale as she is on  Steroids, recent A1c is 9.4 reflecting overall poor control, continue glipizide 10 mg breakfast and increase Lantus insulin to 45 units daily  5)Hypothyroidism:  c/n Levothyroxine, TSH is 0.7    6)Abnormal MRI: she declines follow up with neurosurgery,   and stated she is not going to do it.   7)HTN- stable, continue amlodipine 5 daily and lisinopril 20 minute daily as well as clonidine 0.2 mg daily at bedtime  DVT prophylaxis: Lovenox.  Code Status: DNR.   Family Communication:  Sister at bedside.    Disposition Plan: To home  Lab Results  Component Value Date   PLT 238 10/25/2016    Inpatient Medications  Scheduled Meds: . amLODipine  5 mg Oral Daily  . aspirin EC  81 mg Oral Daily  . cloNIDine  0.2 mg Oral QHS  . enoxaparin (LOVENOX) injection  40 mg Subcutaneous Q24H  . ferrous sulfate  325 mg Oral Q breakfast  . FLUoxetine  20 mg Oral QHS  . FLUoxetine  40 mg Oral q morning - 03T  . folic acid  0.5 mg Oral Daily  . furosemide  40 mg Oral Daily  . gabapentin  300 mg Oral QHS  . glipiZIDE  10 mg Oral Q breakfast  . insulin aspart  0-20 Units Subcutaneous TID WC  . insulin aspart  0-5 Units Subcutaneous QHS  . insulin glargine  45 Units Subcutaneous Q2200  . ipratropium-albuterol  3 mL Nebulization Q6H  . levothyroxine  100 mcg Oral QAC breakfast  . lisinopril  20 mg Oral Daily  . methylPREDNISolone (SOLU-MEDROL) injection  40 mg Intravenous Q12H  . mometasone-formoterol  2 puff Inhalation BID  . pantoprazole  40 mg Oral Daily  . potassium chloride  20 mEq Oral Daily  . rosuvastatin  20 mg Oral Daily  .  tiotropium  18 mcg Inhalation Daily  . Vitamin D (Ergocalciferol)  50,000 Units Oral Q7 days   Continuous Infusions: . levofloxacin (LEVAQUIN) IV Stopped (10/26/16 0400)   PRN Meds:.acetaminophen, ALPRAZolam, guaiFENesin-dextromethorphan, polyethylene glycol, zolpidem    Anti-infectives    Start     Dose/Rate Route Frequency Ordered Stop   10/26/16 0130  levofloxacin (LEVAQUIN) IVPB 750 mg     750 mg 100 mL/hr over 90 Minutes Intravenous Every 24 hours 10/25/16 0328     10/25/16 0130  levofloxacin (LEVAQUIN) IVPB 750 mg     750 mg 100 mL/hr over 90 Minutes Intravenous  Once 10/25/16 0119 10/25/16 0304        Objective:   Vitals:   10/26/16 0035 10/26/16 0644 10/26/16 0805 10/26/16 0823  BP:  127/68    Pulse:  94    Resp:  20    Temp:  97.6 F (36.4 C)    TempSrc:  Oral    SpO2: 99% 95% 90% (!) 88%  Weight:      Height:        Wt Readings from  Last 3 Encounters:  10/24/16 111.1 kg (245 lb)  10/23/16 111.1 kg (245 lb)  07/29/16 111.5 kg (245 lb 12.8 oz)     Intake/Output Summary (Last 24 hours) at 10/26/16 1334 Last data filed at 10/26/16 0900  Gross per 24 hour  Intake             1710 ml  Output                0 ml  Net             1710 ml     Physical Exam  Gen:- Awake Alert,  In no apparent distress  HEENT:- [place].AT, No sclera icterus Neck-Supple Neck,No JVD,.  Lungs-  Velcro type rales, CV- S1, S2 normal Abd-  +ve B.Sounds, Abd Soft, No tenderness,    Extremity/Skin:- No  edema,       Data Review:   Micro Results No results found for this or any previous visit (from the past 240 hour(s)).  Radiology Reports Dg Chest 2 View  Result Date: 10/24/2016 CLINICAL DATA:  Acute onset of shortness of breath and generalized chest heaviness. Initial encounter. EXAM: CHEST  2 VIEW COMPARISON:  Chest radiograph and CT of the chest performed 07/18/2016 FINDINGS: Changes of pulmonary fibrosis are again noted. No definite superimposed focal airspace consolidation is seen. No pleural effusion or pneumothorax is identified. The heart is mildly enlarged. No acute osseous abnormalities are seen. IMPRESSION: Changes of pulmonary fibrosis again noted. No definite superimposed focal airspace consolidation seen at this time. Mild cardiomegaly. Electronically Signed   By: Garald Balding M.D.   On: 10/24/2016 23:38     CBC  Recent Labs Lab 10/24/16 2314 10/25/16 0433  WBC 9.6 10.5  HGB 12.5 11.5*  HCT 39.8 37.0  PLT 240 238  MCV 94.3 94.6  MCH 29.6 29.4  MCHC 31.4 31.1  RDW 14.1 14.1  LYMPHSABS 1.6  --   MONOABS 0.7  --   EOSABS 0.7  --   BASOSABS 0.1  --     Chemistries   Recent Labs Lab 10/22/16 1605 10/24/16 2314 10/25/16 0433  NA 141 139 136  K 3.9 4.0 3.9  CL 99 102 101  CO2 32* 28 27  GLUCOSE 120* 107* 215*  BUN 14 19 21*  CREATININE 1.04* 1.08* 1.18*  CALCIUM 9.3 9.3 8.7*  AST  --  21  --   ALT  --   11*  --   ALKPHOS  --  88  --   BILITOT  --  0.4  --    ------------------------------------------------------------------------------------------------------------------ No results for input(s): CHOL, HDL, LDLCALC, TRIG, CHOLHDL, LDLDIRECT in the last 72 hours.  Lab Results  Component Value Date   HGBA1C 9.4 (H) 10/22/2016   ------------------------------------------------------------------------------------------------------------------  Recent Labs  10/25/16 0433  TSH 0.777   ------------------------------------------------------------------------------------------------------------------ No results for input(s): VITAMINB12, FOLATE, FERRITIN, TIBC, IRON, RETICCTPCT in the last 72 hours.  Coagulation profile No results for input(s): INR, PROTIME in the last 168 hours.  No results for input(s): DDIMER in the last 72 hours.  Cardiac Enzymes  Recent Labs Lab 10/24/16 2314  TROPONINI 0.03*   ------------------------------------------------------------------------------------------------------------------    Component Value Date/Time   BNP 84.0 10/24/2016 2314     [name] M.D on 10/26/2016 at 1:34 PM  Between 7am to 7pm - Pager - [phone]  After 7pm go to [email or web address] - password [email or web address]  [facility] -  Office  [phone]   Voice Recognition Viviann Spare dictation system was used to create this note, attempts have been made to correct errors. Please contact the author with questions and/or clarifications.

## 2016-10-27 LAB — GLUCOSE, CAPILLARY
Glucose-Capillary: 427 mg/dL — ABNORMAL HIGH (ref 65–99)
Glucose-Capillary: 276 mg/dL — ABNORMAL HIGH (ref 65–99)

## 2016-10-27 MED ORDER — LISINOPRIL 20 MG PO TABS: 20.0000 mg | ORAL_TABLET | Freq: Every day | ORAL | 1 refills | Status: DC

## 2016-10-27 MED ORDER — PREDNISONE 20 MG PO TABS: 40.0000 mg | ORAL_TABLET | Freq: Every day | ORAL | 0 refills | Status: AC

## 2016-10-27 MED ORDER — DOXYCYCLINE HYCLATE 100 MG PO TABS: 100.0000 mg | ORAL_TABLET | Freq: Two times a day (BID) | ORAL | 0 refills | Status: DC

## 2016-10-27 MED ORDER — INSULIN GLARGINE 100 UNIT/ML SOLOSTAR PEN: 40.0000 [IU] | PEN_INJECTOR | Freq: Every day | SUBCUTANEOUS | 11 refills | Status: DC

## 2016-10-27 MED ORDER — INSULIN ASPART 100 UNIT/ML ~~LOC~~ SOLN
26.0000 [IU] | Freq: Once | SUBCUTANEOUS | Status: AC
  Administered 2016-10-27: 26 [IU] via SUBCUTANEOUS

## 2016-10-27 MED ORDER — LEVOFLOXACIN 750 MG PO TABS: 750.0000 mg | ORAL_TABLET | Freq: Every day | ORAL | Status: DC

## 2016-10-27 MED ORDER — PREDNISONE 20 MG PO TABS
40.0000 mg | ORAL_TABLET | Freq: Every day | ORAL | Status: DC
  Administered 2016-10-27: 40 mg via ORAL
  Filled 2016-10-27: qty 2

## 2016-10-27 NOTE — Progress Notes (Signed)
Pt's CBG 427 prior to breakfast. Dr. Denton Brick paged and made aware.

## 2016-10-27 NOTE — Care Management Note (Signed)
Case Management Note  Patient Details  Name: BERNESE DOFFING MRN: [id number] Date of Birth: 1950/06/10  Subjective/Objective:  Adm with COPD exacerbation. From home, ind PTA. Walks with cane. Wears chronic oxygen.                 Action/Plan: CM consulted for [facility] aide. When discussed with patient she wants someone to "aid" with house cleaning. Declines need for [facility] aide to assist in ADL's or [facility] RN. No CM needs. Patient discharging home today, having sister bring O2 tank for transport.    Expected Discharge Date:     10/27/2016             Expected Discharge Plan:  Home/Self Care  In-House Referral:     Discharge planning Services  CM Consult  Post Acute Care Choice:    Choice offered to:  NA  DME Arranged:    DME Agency:     HH Arranged:    HH Agency:     Status of Service:  Completed, signed off  If discussed at [employer] of Stay Meetings, dates discussed:    Additional Comments:  Mary Hockey, Chauncey Reading, RN 10/27/2016, 11:44 AM

## 2016-10-27 NOTE — Discharge Summary (Signed)
[name], is a 66 y.o. female  DOB 23-Sep-1950  MRN [id number].  Admission date:  10/24/2016  Admitting Physician  Orvan Falconer, MD  Discharge Date:  10/27/2016   Primary MD  Fayrene Helper, MD  Recommendations for primary care physician for things to follow:   Outpatient sleep study Admission Diagnosis  COPD exacerbation [facility]) [J44.1]   Discharge Diagnosis  COPD exacerbation ([place]) [J44.1]   Principal Problem:   COPD exacerbation ([place]) Active Problems:   Hypothyroidism   Hyperlipidemia LDL goal <100   Morbid obesity ([place])   NICOTINE ADDICTION   Essential hypertension   Uncontrolled diabetes mellitus with diabetic nephropathy ([place])   Oxygen dependent   Chronic respiratory failure with hypoxia ([place])   DNR no code (do not resuscitate)   Unsteady gait   Abnormal brain scan      Past Medical History:  Diagnosis Date  . Anxiety   . COPD (chronic obstructive pulmonary disease) ([place])   . Depression 2008  . Diabetes mellitus, type 1 1983   type 2 diabetes  . DJD (degenerative joint disease)    of the spine   . GERD (gastroesophageal reflux disease)   . Hyperlipidemia 2007  . Hypertension 1983  . Hypothyroidism 2004   left thyroidectomy  . Insomnia   . Obesity     Past Surgical History:  Procedure Laterality Date  . APPENDECTOMY    . bilatal great toenail removed , complicated by MRSA in rigt toenail  May 2011  . TONSILLECTOMY  childhood   . TOTAL ABDOMINAL HYSTERECTOMY W/ BILATERAL SALPINGOOPHORECTOMY    . tracheotomy secondary to failed attempt and lung biopsy         HPI  from the history and physical done on the day of admission:    HPI: [name] is an 66 y.o. female with hx of severe COPD on home oxygen, active smoker with no intention to quit, abnormal head MRI with no intention according to her to follow up with neurosurgery, DM, HTN, HLD, hypothyrodisim on  supplement, presented to the ER with several days of increase SOB, wheezing, and having non productive coughs.  She has no CP, nausea, vomiting or fever, chills.  Reviewed of her PCP record showed that her PCP felt she should be in assisted living, but she refused.  Last admission, she was acciddentally overdose on Narcotics, and her benzo was decreased.  Work up in the ER included CXR which showed no definite PNA, and serology was unremarkable.  She was given hour nebs, along with IV steroids, and IV levoquin, and hospitalist was asked to admit her for COPD exacerbation which required higher oxygen than her baseline home oxygen level.   She has been a DNR.     Hospital Course:    1)Acute COPD/Pulmonary Fibrosis Exacerbation: Clinically improving, continue Supplemental oxygen, switch from Solu-Medrol to prednisone 40 mg daily for additional 5 days,  continue bronchodilators, mucolytics, Discharge home on doxycycline 100 mg twice a day for 10 days  2) acute  and chronic hypoxic respiratory failure - secondary to #1 above, at baseline patient uses oxygen at home, treat as above in #1 . Strongly advised to follow-up with Dr. Luan Pulling the pulmonologist for outpatient sleep study she may have obstructive sleep apnea  3)Tobacco abuse: Smoking cessation advised, patient declines, patient ceased she has no plans to quit smoking. Please note the patient is supposed to be on continuous oxygen at home  4)DM: Glycemic control is worse due to steroids, should improve with tapering of steroids, recent A1c is 9.4 reflecting overall poor control, continue glipizide 10 mg breakfast and increase Lantus insulin to 40 units daily. Patient is not compliant with dietary restrictions, she drinks regular sodas and eats sweets. Patient states she has no plans to change her diet  5)Hypothyroidism: c/n Levothyroxine, TSH is 0.7    6)Abnormal MRI: she declines follow up with neurosurgery,   and stated she is not going to  do it.   7)HTN- stable, continue amlodipine 5 daily and lisinopril 20 minute daily as well as clonidine 0.2 mg daily at bedtime   Discharge Condition: stable, anticipated that patient is high risk for readmission due to noncompliance with lifestyle and dietary modifications, she continues to smoke despite being on home oxygen, she continues to drink regular soda and eat sweets  despite uncontrolled diabetes  Follow UP  Follow-up Information    Fayrene Helper, MD. Schedule an appointment as soon as possible for a visit in 1 week(s).   Specialty:  Family Medicine Contact information: [address] [place] [address] [phone]        Sinda Du, MD. Schedule an appointment as soon as possible for a visit in 2 week(s).   Specialty:  Pulmonary Disease Why:  To arrange outpatient sleep study Contact information: [address] [place] [address] [phone]             Diet and Activity recommendation:  As advised  Discharge Instructions    Quit smoking Take medications as prescribed Use oxygen at the time Follow-up with Dr. Sinda Du the pulmonologist to arrange outpatient sleep study so you can get a CPAP machine  Discharge Instructions    Call MD for:  difficulty breathing, headache or visual disturbances    Complete by:  As directed    Call MD for:  persistant dizziness or light-headedness    Complete by:  As directed    Call MD for:  persistant nausea and vomiting    Complete by:  As directed    Call MD for:  temperature >100.4    Complete by:  As directed    Diet - low sodium heart healthy    Complete by:  As directed    Diet Carb Modified    Complete by:  As directed    Discharge instructions    Complete by:  As directed    Quit smoking Take medications as prescribed Use oxygen at the time Follow-up with Dr. Sinda Du the pulmonologist to arrange outpatient sleep study so you can get a CPAP  machine   Increase activity slowly    Complete by:  As directed         Discharge Medications     Allergies as of 10/27/2016      Reactions   Codeine    REACTION: nausea      Medication List    STOP taking these medications   lisinopril-hydrochlorothiazide 20-12.5 MG tablet Commonly known as:  PRINZIDE,ZESTORETIC     TAKE these medications   acetaminophen 325 MG tablet Commonly known as:  TYLENOL Take 650 mg by mouth every 6 (six) hours as needed.   ALPRAZolam 1 MG tablet Commonly known as:  XANAX TAKE ONE TABLET FOUR TIMES A DAY   amLODipine 5 MG tablet Commonly known as:  NORVASC take 1 tablet by mouth once daily   ASPIRIN LOW DOSE 81 MG EC tablet Generic drug:  aspirin Take 81 mg by mouth daily.   B-D INS SYR ULTRAFINE 1CC/31G 31G X 5/16" 1 ML Misc Generic drug:  Insulin Syringe-Needle U-100 use as directed FOR ONCE DAILY TESTING   cloNIDine 0.2 MG tablet Commonly known as:  CATAPRES take 1 tablet by mouth at bedtime   doxycycline 100 MG tablet Commonly known as:  VIBRA-TABS Take 1 tablet (100 mg total) by mouth 2 (two) times daily.   DUONEB 0.5-2.5 (3) MG/3ML Soln Generic drug:  ipratropium-albuterol Take 3 mLs by nebulization 4 (four) times daily as needed.   esomeprazole 40 MG capsule Commonly known as:  NEXIUM take 1 capsule by mouth once daily BEFORE BREAKFAST   ferrous sulfate 325 (65 FE) MG tablet Take 325 mg by mouth every other day.   FLUoxetine 20 MG capsule Commonly known as:  PROZAC Take two in the am and one in the evening   Fluticasone-Salmeterol 250-50 MCG/DOSE Aepb Commonly known as:  ADVAIR DISKUS inhale 1 dose by mouth twice a day   folic acid 161 MCG tablet Commonly known as:  FOLVITE Take 400 mcg by mouth daily.   furosemide 40 MG tablet Commonly known as:  LASIX take 1 tablet by mouth once daily   gabapentin 300 MG capsule Commonly known as:  NEURONTIN Take 1 capsule (300 mg total) by mouth at bedtime.     glipiZIDE 10 MG 24 hr tablet Commonly known as:  GLIPIZIDE XL Two tablets once daily with breakfast   Insulin Glargine 100 UNIT/ML Solostar Pen Commonly known as:  LANTUS SOLOSTAR Inject 40 Units into the skin daily at 10 pm. What changed:  how much to take   Insulin Pen Needle 31G X 5 MM Misc Use once daily to inject insulin dx E11.65   levothyroxine 100 MCG tablet Commonly known as:  SYNTHROID, LEVOTHROID take 1 tablet by mouth once daily   lisinopril 20 MG tablet Commonly known as:  PRINIVIL,ZESTRIL Take 1 tablet (20 mg total) by mouth daily. Start taking on:  10/28/2016   montelukast 10 MG tablet Commonly known as:  SINGULAIR Take 1 tablet by mouth at bedtime.   polyethylene glycol packet Commonly known as:  MIRALAX / GLYCOLAX Take 17 g by mouth daily as needed for mild constipation.   potassium chloride 20 MEQ/15ML (10%) solution TAKE 1 AND 1/2 TEASPOONFUL BY MOUTH ONCE DAILY   predniSONE 20 MG tablet Commonly known as:  DELTASONE Take 2 tablets (40 mg total) by mouth daily with breakfast. Start taking on:  10/28/2016   rosuvastatin 20 MG tablet Commonly known as:  CRESTOR take 1 tablet by mouth once daily   tiotropium 18 MCG inhalation capsule Commonly known as:  SPIRIVA Place 1 capsule (18 mcg total) into inhaler and inhale daily.   TUMS 500 MG chewable tablet Generic drug:  calcium carbonate Chew 1 tablet by mouth as needed.   Vitamin D (Ergocalciferol) 50000 units Caps capsule Commonly known as:  DRISDOL take 1 capsule by mouth EVERY 7 DAYS   XOPENEX HFA 45 MCG/ACT inhaler Generic drug:  levalbuterol Inhale 1-2 puffs into the lungs as needed.   zolpidem 10 MG tablet Commonly known as:  AMBIEN Take 1 tablet by mouth at bedtime.       Major procedures and Radiology Reports - PLEASE review detailed and final reports for all details, in brief -    Dg Chest 2 View  Result Date: 10/24/2016 CLINICAL DATA:  Acute onset of shortness of breath and  generalized chest heaviness. Initial encounter. EXAM: CHEST  2 VIEW COMPARISON:  Chest radiograph and CT of the chest performed 07/18/2016 FINDINGS: Changes of pulmonary fibrosis are again noted. No definite superimposed focal airspace consolidation is seen. No pleural effusion or pneumothorax is identified. The heart is mildly enlarged. No acute osseous abnormalities are seen. IMPRESSION: Changes of pulmonary fibrosis again noted. No definite superimposed focal airspace consolidation seen at this time. Mild cardiomegaly. Electronically Signed   By: Garald Balding M.D.   On: 10/24/2016 23:38    Micro Results   No results found for this or any previous visit (from the past 240 hour(s)).     Today   Subjective    [name] today has no new c/o, shortness of breath and cough is better, no chest pains          Patient has been seen and examined prior to discharge   Objective   Blood pressure 131/71, pulse 88, temperature 97.9 F (36.6 C), temperature source Oral, resp. rate 18, height 5\' 3"  (1.6 m), weight 111.1 kg (245 lb), SpO2 91 %.   Intake/Output Summary (Last 24 hours) at 10/27/16 1253 Last data filed at 10/27/16 0200  Gross per 24 hour  Intake              790 ml  Output                0 ml  Net              790 ml    Exam Gen:- Awake  In no apparent distress , obese , Able to speak in complete sentences HEENT:- [place].AT,   Nose- [place] 3 L/min Neck-Supple Neck,No JVD,  Lungs- diminished in bases, no significant wheezing,  CV- S1, S2 normal Abd-  +ve B.Sounds, Abd Soft, No tenderness,    Extremity/Skin:- Intact peripheral pulses    Data Review   CBC w Diff: Lab Results  Component Value Date   WBC 10.5 10/25/2016   HGB 11.5 (L) 10/25/2016   HCT 37.0 10/25/2016   PLT 238 10/25/2016   LYMPHOPCT 17 10/24/2016   MONOPCT 7 10/24/2016   EOSPCT 7 10/24/2016   BASOPCT 1 10/24/2016    CMP: Lab Results  Component Value Date   NA 136 10/25/2016   K 3.9 10/25/2016   CL  101 10/25/2016   CO2 27 10/25/2016   BUN 21 (H) 10/25/2016   CREATININE 1.18 (H) 10/25/2016   CREATININE 1.04 (H) 10/22/2016   PROT 7.7 10/24/2016   ALBUMIN 3.8 10/24/2016   BILITOT 0.4 10/24/2016   ALKPHOS 88 10/24/2016   AST 21 10/24/2016   ALT 11 (L) 10/24/2016  .   Total Discharge time is about 33 minutes  Nayana Lenig M.D on 10/27/2016 at 12:53 PM  [facility]   Office  [phone]  Voice Recognition Viviann Spare dictation system was used to create this note, attempts have been made to correct errors. Please contact the author with questions and/or clarifications.

## 2016-10-27 NOTE — Progress Notes (Signed)
Pt discharged home today per Dr. Denton Brick.  Pt's IV site D/C'd and WDL.  Pt's VSS.  Pt provided with home medication list, discharge instructions and prescriptions.  Verbalized understanding.  Pt left floor via WC in stable condition accompanied by NT.

## 2016-10-27 NOTE — Discharge Instructions (Signed)
Quit smoking Take medications as prescribed Use oxygen at the time Follow-up with Dr. Sinda Du the pulmonologist to arrange outpatient sleep study so you can get a CPAP machine

## 2016-11-03 ENCOUNTER — Encounter: Payer: Self-pay | Admitting: Family Medicine

## 2016-11-03 ENCOUNTER — Ambulatory Visit (INDEPENDENT_AMBULATORY_CARE_PROVIDER_SITE_OTHER): Payer: Medicare Other | Admitting: Family Medicine

## 2016-11-03 VITALS — BP 120/74 | HR 88 | Resp 18 | Ht 63.0 in | Wt 250.0 lb

## 2016-11-03 DIAGNOSIS — F17210 Nicotine dependence, cigarettes, uncomplicated: Secondary | ICD-10-CM | POA: Diagnosis not present

## 2016-11-03 DIAGNOSIS — I10 Essential (primary) hypertension: Secondary | ICD-10-CM | POA: Diagnosis not present

## 2016-11-03 DIAGNOSIS — Z09 Encounter for follow-up examination after completed treatment for conditions other than malignant neoplasm: Secondary | ICD-10-CM

## 2016-11-03 DIAGNOSIS — R9389 Abnormal findings on diagnostic imaging of other specified body structures: Secondary | ICD-10-CM

## 2016-11-03 DIAGNOSIS — F17200 Nicotine dependence, unspecified, uncomplicated: Secondary | ICD-10-CM

## 2016-11-03 DIAGNOSIS — R928 Other abnormal and inconclusive findings on diagnostic imaging of breast: Secondary | ICD-10-CM | POA: Diagnosis not present

## 2016-11-03 DIAGNOSIS — G4739 Other sleep apnea: Secondary | ICD-10-CM | POA: Diagnosis not present

## 2016-11-03 DIAGNOSIS — R938 Abnormal findings on diagnostic imaging of other specified body structures: Secondary | ICD-10-CM

## 2016-11-03 NOTE — Assessment & Plan Note (Signed)
States redy for repeat mammogram will [facility]

## 2016-11-03 NOTE — Assessment & Plan Note (Signed)
Controlled, no change in medication DASH diet and commitment to daily physical activity for a minimum of 30 minutes discussed and encouraged, as a part of hypertension management. The importance of attaining a healthy weight is also discussed.  BP/Weight 10/27/2016 10/24/2016 10/23/2016 07/29/2016 07/21/2016 07/19/2016 6/77/3736  Systolic BP 681 - 594 707 615 - -  Diastolic BP 71 - 80 72 46 - -  Wt. (Lbs) - 245 245 245.8 - 251.3 -  BMI - 43.4 43.4 43.54 - - 44.52  Some encounter information is confidential and restricted. Go to Review Flowsheets activity to see all data.

## 2016-11-03 NOTE — Patient Instructions (Addendum)
F/u as before, call if you need me sooner  PLease work on cutting back on cigarettes, you need to quit  I have referred you to dr Luan Pulling re sleep apnea and abnormal chest scan  Please schedule your mammogram when you decide on this     Steps to Quit Smoking Smoking tobacco can be bad for your health. It can also affect almost every organ in your body. Smoking puts you and people around you at risk for many serious long-lasting (chronic) diseases. Quitting smoking is hard, but it is one of the best things that you can do for your health. It is never too late to quit. What are the benefits of quitting smoking? When you quit smoking, you lower your risk for getting serious diseases and conditions. They can include:  Lung cancer or lung disease.  Heart disease.  Stroke.  Heart attack.  Not being able to have children (infertility).  Weak bones (osteoporosis) and broken bones (fractures).  If you have coughing, wheezing, and shortness of breath, those symptoms may get better when you quit. You may also get sick less often. If you are pregnant, quitting smoking can help to lower your chances of having a baby of low birth weight. What can I do to help me quit smoking? Talk with your doctor about what can help you quit smoking. Some things you can do (strategies) include:  Quitting smoking totally, instead of slowly cutting back how much you smoke over a period of time.  Going to in-person counseling. You are more likely to quit if you go to many counseling sessions.  Using resources and support systems, such as: ? Database administrator with a Social worker. ? Phone quitlines. ? Careers information officer. ? Support groups or group counseling. ? Text messaging programs. ? Mobile phone apps or applications.  Taking medicines. Some of these medicines may have nicotine in them. If you are pregnant or breastfeeding, do not take any medicines to quit smoking unless your doctor says it is okay.  Talk with your doctor about counseling or other things that can help you.  Talk with your doctor about using more than one strategy at the same time, such as taking medicines while you are also going to in-person counseling. This can help make quitting easier. What things can I do to make it easier to quit? Quitting smoking might feel very hard at first, but there is a lot that you can do to make it easier. Take these steps:  Talk to your family and friends. Ask them to support and encourage you.  Call phone quitlines, reach out to support groups, or work with a Social worker.  Ask people who smoke to not smoke around you.  Avoid places that make you want (trigger) to smoke, such as: ? Bars. ? Parties. ? Smoke-break areas at work.  Spend time with people who do not smoke.  Lower the stress in your life. Stress can make you want to smoke. Try these things to help your stress: ? Getting regular exercise. ? Deep-breathing exercises. ? Yoga. ? Meditating. ? Doing a body scan. To do this, close your eyes, focus on one area of your body at a time from head to toe, and notice which parts of your body are tense. Try to relax the muscles in those areas.  Download or buy apps on your mobile phone or tablet that can help you stick to your quit plan. There are many free apps, such as QuitGuide from the CDC (  Centers for Disease Control and Prevention). You can find more support from smokefree.gov and other websites.  This information is not intended to replace advice given to you by your health care provider. Make sure you discuss any questions you have with your health care provider. Document Released: 03/15/2009 Document Revised: 01/15/2016 Document Reviewed: 10/03/2014 Elsevier Interactive Patient Education  2018 [employer].

## 2016-11-03 NOTE — Assessment & Plan Note (Signed)
Patient is asked and  confirms current  Nicotine use.  Five to seven minutes of time is spent in counseling the patient of the need to quit smoking  Advice to quit is delivered clearly specifically in reducing the risk of developing heart disease, having a stroke, or of developing all types of cancer, especially lung and oral cancer. Improvement in breathing and exercise tolerance and quality of life is also discussed, as is the economic benefit.  Assessment of willingness to quit or to make an attempt to quit is made and documented  Assistance in quit attempt is made with several and varied options presented, based on patient's desire and need. These include  literature, local classes available, 1800 QUIT NOW number, OTC and prescription medication.  The GOAL to be NICOTINE FREE is re emphasized.  The patient has set a personal goal of either reduction or discontinuation and follow up is arranged between 12 weeks, not committing to quitting

## 2016-11-03 NOTE — Progress Notes (Signed)
[name]     MRN: [id number]      DOB: 1950-11-10   HPI [name] is here for follow up and re-evaluation of chronic medical conditions, medication management and review of any available recent lab and radiology data.  Preventive health is updated, specifically  Cancer screening and Immunization. Now has decided to get mammogram  Recent scan in ED shows abnormality in chest which needs follow up. The PT denies any adverse reactions to current medications since the last visit.  Has not been testing blood sugar and this is now very uncontrolled  ROS Denies recent fever or chills. Denies sinus pressure, nasal congestion, ear pain or sore throat. Denies chest congestion, productive cough or wheezing. Denies chest pains, palpitations and leg swelling Denies abdominal pain, nausea, vomiting,diarrhea or constipation.   Denies dysuria, frequency, hesitancy or incontinence. Denies uncontrolled joint pain, swelling and limitation in mobility. Denies headaches, seizures, numbness, or tingling. Denies depression, anxiety or insomnia. Denies skin break down or rash.   PE  BP 110/80   Pulse 88   Resp 16   Ht 5\' 3"  (1.6 m)   Wt 245 lb (111.1 kg)   SpO2 93% Comment: on 3liters of oxygen  BMI 43.40 kg/m   Patient alert and oriented and in no cardiopulmonary distress.  HEENT: No facial asymmetry, EOMI,   oropharynx pink and moist.  Neck supple no JVD, no mass.  Chest: decreased though adequate air entry ,  Scattered wheezes no crackles  CVS: S1, S2 no murmurs, no S3.Regular rate.  ABD: Soft non tender.   Ext: No edema  MS: Adequate though reduced ROM spine, shoulders, hips and knees.  Skin: Intact, no ulcerations or rash noted.  Psych: Good eye contact, normal affect. Memory intact not anxious or depressed appearing.  CNS: CN 2-12 intact, power,  normal throughout.no focal deficits noted.   Assessment & Plan  Uncontrolled diabetes mellitus with diabetic nephropathy  ([place]) uincontrolled [name] is reminded of the importance of commitment to daily physical activity for 30 minutes or more, as able and the need to limit carbohydrate intake to 30 to 60 grams per meal to help with blood sugar control.   The need to take medication as prescribed, test blood sugar as directed, and to call between visits if there is a concern that blood sugar is uncontrolled is also discussed.   [name] is reminded of the importance of daily foot exam, annual eye examination, and good blood sugar, blood pressure and cholesterol control.  Diabetic Labs Latest Ref Rng & Units 10/22/2016 07/19/2016 07/18/2016 06/25/2016 03/26/2016  HbA1c <5.7 % 9.4(H) 8.4(H) - 8.7(H) 8.5(H)  Microalbumin Not estab mg/dL - - - - -  Micro/Creat Ratio <30 mcg/mg creat - - - - -  Chol 0 - 200 mg/dL - 148 - - 134  HDL >40 mg/dL - 63 - - 56  Calc LDL 0 - 99 mg/dL - 78 - - 60  Triglycerides <150 mg/dL - 36 - - 90  Creatinine 0.50 - 0.99 mg/dL 1.04(H) 1.07(H) 1.35(H) 1.39(H) 1.25(H)   BP/Weight 10/23/2016 07/29/2016 07/21/2016 07/19/2016 07/18/2016 07/01/2016 27/08/5007  Systolic BP 381 829 937 - - 169 678  Diastolic BP 80 72 46 - - 82 82  Wt. (Lbs) 245 245.8 - 251.3 - 249 252  BMI 43.4 43.54 - - 44.52 44.11 44.64  Some encounter information is confidential and restricted. Go to Review Flowsheets activity to see all data.   Foot/eye exam completion dates  Latest Ref Rng & Units 07/01/2016 06/06/2016  Eye Exam No Retinopathy - No Retinopathy  Foot exam Order - - -  Foot Form Completion - Done -        Abnormal mammogram States redy for repeat mammogram will [facility]  Essential hypertension Controlled, no change in medication DASH diet and commitment to daily physical activity for a minimum of 30 minutes discussed and encouraged, as a part of hypertension management. The importance of attaining a healthy weight is also discussed.  BP/Weight 10/27/2016 10/24/2016 10/23/2016  07/29/2016 07/21/2016 07/19/2016 07/12/4707  Systolic BP 628 - 366 294 765 - -  Diastolic BP 71 - 80 72 46 - -  Wt. (Lbs) - 245 245 245.8 - 251.3 -  BMI - 43.4 43.4 43.54 - - 44.52  Some encounter information is confidential and restricted. Go to Review Flowsheets activity to see all data.       NICOTINE ADDICTION Patient is asked and  confirms current  Nicotine use.  Five to seven minutes of time is spent in counseling the patient of the need to quit smoking  Advice to quit is delivered clearly specifically in reducing the risk of developing heart disease, having a stroke, or of developing all types of cancer, especially lung and oral cancer. Improvement in breathing and exercise tolerance and quality of life is also discussed, as is the economic benefit.  Assessment of willingness to quit or to make an attempt to quit is made and documented  Assistance in quit attempt is made with several and varied options presented, based on patient's desire and need. These include  literature, local classes available, 1800 QUIT NOW number, OTC and prescription medication.  The GOAL to be NICOTINE FREE is re emphasized.  The patient has set a personal goal of either reduction or discontinuation and follow up is arranged between 12 weeks, not committing to quitting

## 2016-11-04 ENCOUNTER — Other Ambulatory Visit: Payer: Self-pay | Admitting: Family Medicine

## 2016-11-04 DIAGNOSIS — I10 Essential (primary) hypertension: Secondary | ICD-10-CM

## 2016-11-11 ENCOUNTER — Telehealth: Payer: Self-pay

## 2016-11-11 NOTE — Telephone Encounter (Signed)
Pt called needing clarification of lisinopril dosing.

## 2016-11-14 ENCOUNTER — Encounter: Payer: Self-pay | Admitting: Family Medicine

## 2016-11-14 NOTE — Assessment & Plan Note (Addendum)
Improved , will continue medications as she is taking, not yet back to baseline Trying to cut back on smoking , unable to set a quit date  Needs to also f/u with her pulmonologist who will arrange and follow her abn chest scan

## 2016-11-14 NOTE — Assessment & Plan Note (Signed)
Controlled, no change in medication  

## 2016-11-14 NOTE — Assessment & Plan Note (Signed)
Repeat mammogram past due needs to have this done

## 2016-11-14 NOTE — Assessment & Plan Note (Signed)

## 2016-11-14 NOTE — Progress Notes (Signed)
   [name]     MRN: [id number]      DOB: 13-Jul-1950   HPI [name] is here for follow up of recent hospitalization from 5/25 to 10/27/2016 for COPD exacerbation Hospital course and suggested follow up studies and referrals are reviewed and carried out at visit ROS Denies recent fever or chills. Denies sinus pressure, nasal congestion, ear pain or sore throat. chronic cough and wheeze and shortness of breath, not yet back to baseline, but improving Denies chest pains, palpitations and leg swelling Denies abdominal pain, nausea, vomiting,diarrhea or constipation.   Denies dysuria, frequency, hesitancy  Chronic limitation in mobility. Denies headaches, seizures,  Denies uncontrolled depression, anxiety or insomnia. Denies skin break down or rash.   PE  BP 120/74   Pulse 88   Resp 18   Ht 5\' 3"  (1.6 m)   Wt 250 lb (113.4 kg)   SpO2 (!) 85%   BMI 44.29 kg/m   Patient alert and oriented and in no cardiopulmonary distress.  HEENT: No facial asymmetry, EOMI,   oropharynx pink and moist.  Neck supple no JVD, no mass.  Chest: Clear to auscultation bilaterally.  CVS: S1, S2 no murmurs, no S3.Regular rate.  ABD: Soft non tender.   Ext: No edema  MS: Decreased  ROM spine, shoulders, hips and knees.  Skin: Intact, no ulcerations or rash noted.  Psych: Good eye contact, normal affect. Memory intact not anxious or depressed appearing.  CNS: CN 2-12 intact, power,  normal throughout.no focal deficits noted.   [facility] discharge follow-up Improved , will continue medications as she is taking, not yet back to baseline Trying to cut back on smoking , unable to set a quit date  Needs to also f/u with her pulmonologist who will arrange and follow her abn chest scan  NICOTINE ADDICTION Patient is asked and  confirms current  Nicotine use.  Five to seven minutes of time is spent in counseling the patient of the need to quit smoking  Advice to quit  is delivered clearly specifically in reducing the risk of developing heart disease, having a stroke, or of developing all types of cancer, especially lung and oral cancer. Improvement in breathing and exercise tolerance and quality of life is also discussed, as is the economic benefit.  Assessment of willingness to quit or to make an attempt to quit is made and documented  Assistance in quit attempt is made with several and varied options presented, based on patient's desire and need. These include  literature, local classes available, 1800 QUIT NOW number, OTC and prescription medication.  The GOAL to be NICOTINE FREE is re emphasized.  The patient has set a personal goal of either reduction or discontinuation and follow up is arranged between 6 an 16 weeks.    Essential hypertension Controlled, no change in medication   Abnormal mammogram Repeat mammogram past due needs to have this done

## 2016-11-17 NOTE — Telephone Encounter (Signed)
Called patient and no answer x 2

## 2016-11-19 ENCOUNTER — Telehealth: Payer: Self-pay | Admitting: *Deleted

## 2016-11-19 NOTE — Telephone Encounter (Signed)
Patient left message on nurses line stating she did not go for her mammogram in 17 and they told her Dr Moshe Cipro will need to other a diagnostic bilateral mammogram. Please advise

## 2016-11-20 ENCOUNTER — Other Ambulatory Visit: Payer: Self-pay

## 2016-11-20 DIAGNOSIS — Z1231 Encounter for screening mammogram for malignant neoplasm of breast: Secondary | ICD-10-CM

## 2016-11-20 NOTE — Telephone Encounter (Signed)
Called pt, left message to return call

## 2016-11-20 NOTE — Telephone Encounter (Signed)
Patient now able to call back and schedule

## 2016-11-20 NOTE — Telephone Encounter (Signed)
Spoke to radiology and they determined that a screening tomo was needed and I placed the order and called patient and left a message for patient to return call.

## 2016-11-21 NOTE — Telephone Encounter (Signed)
Was told to stay on regular lisinopril that was switched from the hct in the hospital. Can discuss with dr at visit. Pt came in already for hosp follow up and this wasn't changed so will stay at the same dose

## 2016-11-24 ENCOUNTER — Other Ambulatory Visit (HOSPITAL_COMMUNITY): Payer: Self-pay | Admitting: Respiratory Therapy

## 2016-11-24 ENCOUNTER — Other Ambulatory Visit (HOSPITAL_COMMUNITY): Payer: Self-pay | Admitting: Pulmonary Disease

## 2016-11-24 ENCOUNTER — Other Ambulatory Visit: Payer: Self-pay | Admitting: Family Medicine

## 2016-11-24 ENCOUNTER — Telehealth: Payer: Self-pay

## 2016-11-24 DIAGNOSIS — J9611 Chronic respiratory failure with hypoxia: Secondary | ICD-10-CM | POA: Diagnosis not present

## 2016-11-24 DIAGNOSIS — J449 Chronic obstructive pulmonary disease, unspecified: Secondary | ICD-10-CM | POA: Diagnosis not present

## 2016-11-24 DIAGNOSIS — J441 Chronic obstructive pulmonary disease with (acute) exacerbation: Secondary | ICD-10-CM

## 2016-11-24 DIAGNOSIS — R918 Other nonspecific abnormal finding of lung field: Secondary | ICD-10-CM

## 2016-11-24 DIAGNOSIS — E669 Obesity, unspecified: Secondary | ICD-10-CM | POA: Diagnosis not present

## 2016-11-24 DIAGNOSIS — R928 Other abnormal and inconclusive findings on diagnostic imaging of breast: Secondary | ICD-10-CM

## 2016-11-24 DIAGNOSIS — G4733 Obstructive sleep apnea (adult) (pediatric): Secondary | ICD-10-CM | POA: Diagnosis not present

## 2016-11-24 NOTE — Telephone Encounter (Signed)
Wants a referrral to [facility] for eval on the aneurysm in her head.  Also the hospital d/c'd her lisinopril HCT and put her on just plain lisinopril and its not working as well for her. Wants to know if you will switch her back

## 2016-11-24 NOTE — Telephone Encounter (Signed)
Patient called left message during lunch stating that she is still having trouble scheduling this mammogram patient states she needs a 3D diagnostic mammogram. Please advise

## 2016-11-24 NOTE — Telephone Encounter (Signed)
Order has been changed, please see if the appt can be changed to this instead of screening and let pt know

## 2016-11-25 ENCOUNTER — Telehealth: Payer: Self-pay | Admitting: Family Medicine

## 2016-11-25 ENCOUNTER — Other Ambulatory Visit: Payer: Self-pay | Admitting: Family Medicine

## 2016-11-25 DIAGNOSIS — I671 Cerebral aneurysm, nonruptured: Secondary | ICD-10-CM

## 2016-11-25 NOTE — Telephone Encounter (Signed)
See below msg as well.  Wants a referrral to [facility] for eval on the aneurysm in her head.  Also the hospital d/c'd her lisinopril HCT and put her on just plain lisinopril and its not working as well for her. Wants to know if you will switch her back

## 2016-11-25 NOTE — Telephone Encounter (Signed)
Spoke with [name] and the screening was scheduled yesterday and they state its not diagnostic unless the patient is having a problem which she said she was not

## 2016-11-25 NOTE — Telephone Encounter (Signed)
Blood pressure was normal when seen in the office after d/c so no change in the lisinopril Will refer to dr Merlene Laughter follow anuerysm, I assume she also has headaches, he does not operate on aneurysms , she does not want surgery anyhow , so I assume just wamnts a neurologist to follow, have referred

## 2016-11-25 NOTE — Progress Notes (Signed)
amb neurology

## 2016-11-25 NOTE — Telephone Encounter (Signed)
Raquel Sarna from Dr. Alessandra Grout office is calling stating that [name] has called them trying to make an appointment for right sided pain and weakness they are asking Dr. Moshe Cipro for a referral for this issue, please advise?

## 2016-11-26 NOTE — Telephone Encounter (Signed)
Referral has been sent to Dr. Merlene Laughter

## 2016-11-27 ENCOUNTER — Ambulatory Visit (HOSPITAL_COMMUNITY): Payer: Self-pay

## 2016-11-27 NOTE — Telephone Encounter (Signed)
Patient aware.

## 2016-11-28 ENCOUNTER — Other Ambulatory Visit (HOSPITAL_BASED_OUTPATIENT_CLINIC_OR_DEPARTMENT_OTHER): Payer: Self-pay

## 2016-11-28 DIAGNOSIS — G4730 Sleep apnea, unspecified: Secondary | ICD-10-CM

## 2016-12-05 ENCOUNTER — Ambulatory Visit (HOSPITAL_COMMUNITY): Payer: Medicare Other

## 2016-12-05 ENCOUNTER — Other Ambulatory Visit: Payer: Self-pay | Admitting: Family Medicine

## 2016-12-05 ENCOUNTER — Ambulatory Visit (HOSPITAL_COMMUNITY): Payer: Self-pay

## 2016-12-05 ENCOUNTER — Encounter (HOSPITAL_COMMUNITY): Payer: Self-pay

## 2016-12-07 ENCOUNTER — Other Ambulatory Visit (HOSPITAL_COMMUNITY): Payer: Self-pay | Admitting: Psychiatry

## 2016-12-08 ENCOUNTER — Ambulatory Visit (HOSPITAL_COMMUNITY): Payer: Medicare Other | Admitting: Psychiatry

## 2016-12-18 ENCOUNTER — Ambulatory Visit (HOSPITAL_COMMUNITY)
Admission: RE | Admit: 2016-12-18 | Discharge: 2016-12-18 | Disposition: A | Discharge status: Home / Self Care | Payer: Medicare Other | Source: Ambulatory Visit | Attending: Pulmonary Disease | Admitting: Pulmonary Disease

## 2016-12-18 ENCOUNTER — Ambulatory Visit (HOSPITAL_COMMUNITY)
Admission: RE | Admit: 2016-12-18 | Discharge: 2016-12-18 | Disposition: A | Discharge status: Home / Self Care | Payer: Medicare Other | Source: Ambulatory Visit | Attending: Family Medicine | Admitting: Family Medicine

## 2016-12-18 DIAGNOSIS — K449 Diaphragmatic hernia without obstruction or gangrene: Secondary | ICD-10-CM | POA: Insufficient documentation

## 2016-12-18 DIAGNOSIS — Z1231 Encounter for screening mammogram for malignant neoplasm of breast: Secondary | ICD-10-CM | POA: Diagnosis not present

## 2016-12-18 DIAGNOSIS — R911 Solitary pulmonary nodule: Secondary | ICD-10-CM | POA: Diagnosis not present

## 2016-12-18 DIAGNOSIS — I2510 Atherosclerotic heart disease of native coronary artery without angina pectoris: Secondary | ICD-10-CM | POA: Diagnosis not present

## 2016-12-18 DIAGNOSIS — R918 Other nonspecific abnormal finding of lung field: Secondary | ICD-10-CM | POA: Diagnosis not present

## 2016-12-19 ENCOUNTER — Ambulatory Visit (INDEPENDENT_AMBULATORY_CARE_PROVIDER_SITE_OTHER): Payer: Medicare Other | Admitting: Psychiatry

## 2016-12-19 ENCOUNTER — Encounter (HOSPITAL_COMMUNITY): Payer: Self-pay | Admitting: Psychiatry

## 2016-12-19 VITALS — BP 121/73 | HR 92 | Ht 63.0 in | Wt 243.2 lb

## 2016-12-19 DIAGNOSIS — Z818 Family history of other mental and behavioral disorders: Secondary | ICD-10-CM

## 2016-12-19 DIAGNOSIS — Z811 Family history of alcohol abuse and dependence: Secondary | ICD-10-CM | POA: Diagnosis not present

## 2016-12-19 DIAGNOSIS — F333 Major depressive disorder, recurrent, severe with psychotic symptoms: Secondary | ICD-10-CM

## 2016-12-19 MED ORDER — ZOLPIDEM TARTRATE 10 MG PO TABS: 10.0000 mg | ORAL_TABLET | Freq: Every day | ORAL | 2 refills | Status: DC

## 2016-12-19 MED ORDER — ALPRAZOLAM 1 MG PO TABS: ORAL_TABLET | ORAL | 2 refills | Status: DC

## 2016-12-19 MED ORDER — FLUOXETINE HCL 20 MG PO CAPS: ORAL_CAPSULE | ORAL | 2 refills | Status: DC

## 2016-12-19 NOTE — Progress Notes (Signed)
Patient ID: [name], female   DOB: May 06, 1951, 66 y.o.   MRN: [id number] Patient ID: [name], female   DOB: 01-31-1951, 66 y.o.   MRN: [id number] Patient ID: [name], female   DOB: [birth year], 66 y.o.   MRN: [id number] Patient ID: [name], female   DOB: 13-Dec-1950, 66 y.o.   MRN: [id number] Patient ID: [name], female   DOB: 1951-03-31, 66 y.o.   MRN: [id number] Patient ID: [name], female   DOB: [birth year], 66 y.o.   MRN: [id number] Patient ID: [name], female   DOB: 02/17/1951, 66 y.o.   MRN: [id number]  Psychiatric Assessment Adult  Patient Identification:  [name] Date of Evaluation:  12/19/2016 Chief Complaint: depression History of Chief Complaint:   Chief Complaint  Patient presents with  . Depression  . Anxiety  . Follow-up    Depression         Associated symptoms include fatigue and appetite change.  Past medical history includes anxiety.   Anxiety  Symptoms include nervous/anxious behavior and shortness of breath.    this patient is a 66 year old widowed black female who lives alone in [place]. She has no children. She is on disability.  The patient was referred by Dr. Moshe Cipro her primary care physician and Dr. Jefm Miles her psychologist for further evaluation and treatment of major depression and anxiety.  The patient states that she's been depressed since her teen years. She's not really sure why. When she was in college she went through severe depression and had to quit. She's had this on and off through the years but really not receive treatment until a couple years ago after her husband died. Her primary care physician has put her on Prozac and Xanax which have helped to some degree.however recently she has been getting more depressed. She has severe COPD and is on oxygen. Yet, she continues to smoke THREE PACKS PER DAY which she claims is secondary to her depressed mood.  Recently she's gone through more  deaths including her sister in 2014 a niece in 2014. Another sister was recently diagnosed with cancer. The patient has gotten overwhelmed by all this and claims it's getting hard for her to just get out of bed and function. She has no energy, she's been shaky and having tremors in her hands, she complains of short-term memory loss.her sleep is variable and Ambien sometimes helps. Her appetite is up and down. Sometimes she hears the voices of her deceased relatives or "feels her spirits." She's never been to a psychiatric hospitalization  But has been seeing Dr. Jefm Miles in our office for several years. She has never been suicidal and states she is very "spiritual and values life. She stays away from people but she has a god niece and  God sister who check on her faithfully.she denies any other psychotic symptoms and does not use drugs or alcohol  The patient returns after 3 months. She hospitalized again in May for COPD exacerbation. She claims she's cut down her smoking to under a pack a day. She still has a pulmonary nodule on her right upper lobe that was evaluated at CT again yesterday and has grown a little bit. She doesn't seem to want much intervention for many of these things. She states that overall her mood is good she is sleeping well with the Ambien and the Xanax helps her anxiety. She's no longer using any pain medication Review of Systems  Constitutional: Positive  for activity change, appetite change and fatigue.  HENT: Negative.   Eyes: Negative.   Respiratory: Positive for shortness of breath.   Cardiovascular: Negative.   Gastrointestinal: Negative.   Endocrine: Negative.   Genitourinary: Negative.   Musculoskeletal: Positive for arthralgias.  Skin: Negative.   Allergic/Immunologic: Negative.   Neurological: Positive for tremors.  Hematological: Negative.   Psychiatric/Behavioral: Positive for depression, dysphoric mood, hallucinations and sleep disturbance. The patient is  nervous/anxious.    Physical Examnot done  Depressive Symptoms: depressed mood, anhedonia, psychomotor retardation, difficulty concentrating, impaired memory, anxiety, disturbed sleep, decreased appetite,  (Hypo) Manic Symptoms:   Elevated Mood:  No Irritable Mood:  No Grandiosity:  No Distractibility:  No Labiality of Mood:  No Delusions:  No Hallucinations:  Yes Impulsivity:  Yes Sexually Inappropriate Behavior:  No Financial Extravagance:  No Flight of Ideas:  No  Anxiety Symptoms: Excessive Worry:  Yes Panic Symptoms:  No Agoraphobia:  No Obsessive Compulsive: No  Symptoms: None, Specific Phobias:  No Social Anxiety:  No  Psychotic Symptoms:  Hallucinations: Yes Auditory Delusions:  No Paranoia:  No   Ideas of Reference:  No  PTSD Symptoms: Ever had a traumatic exposure:  No Had a traumatic exposure in the last month:  No Re-experiencing: No None Hypervigilance:  No Hyperarousal: No None Avoidance: No None  Traumatic Brain Injury: No   Past Psychiatric History: Diagnosis:Maj. depression  Hospitalizations: none  Outpatient Care: has been seeing Dr. Jefm Miles in our office for counseling  Substance Abuse Care: none  Self-Mutilation: none  Suicidal Attempts:none  Violent Behaviors: none   Past Medical History:   Past Medical History:  Diagnosis Date  . Anxiety   . COPD (chronic obstructive pulmonary disease) ([place])   . Depression 2008  . Diabetes mellitus, type 1 1983   type 2 diabetes  . DJD (degenerative joint disease)    of the spine   . GERD (gastroesophageal reflux disease)   . Hyperlipidemia 2007  . Hypertension 1983  . Hypothyroidism 2004   left thyroidectomy  . Insomnia   . Obesity    History of Loss of Consciousness:  No Seizure History:  No Cardiac History:  No Allergies:   Allergies  Allergen Reactions  . Codeine     REACTION: nausea   Current Medications:  Current Outpatient Prescriptions  Medication Sig Dispense  Refill  . acetaminophen (TYLENOL) 325 MG tablet Take 650 mg by mouth every 6 (six) hours as needed.    . ALPRAZolam (XANAX) 1 MG tablet TAKE ONE TABLET FOUR TIMES A DAY 120 tablet 2  . amLODipine (NORVASC) 5 MG tablet take 1 tablet by mouth once daily 90 tablet 1  . aspirin (ASPIRIN LOW DOSE) 81 MG EC tablet Take 81 mg by mouth daily.      . B-D INS SYR ULTRAFINE 1CC/31G 31G X 5/16" 1 ML MISC use as directed FOR ONCE DAILY TESTING 100 each 5  . calcium carbonate (TUMS) 500 MG chewable tablet Chew 1 tablet by mouth as needed.      . cloNIDine (CATAPRES) 0.2 MG tablet take 1 tablet by mouth at bedtime 90 tablet 1  . doxycycline (VIBRA-TABS) 100 MG tablet Take 1 tablet (100 mg total) by mouth 2 (two) times daily. 20 tablet 0  . esomeprazole (NEXIUM) 40 MG capsule take 1 capsule by mouth once daily BEFORE BREAKFAST 90 capsule 1  . ferrous sulfate 325 (65 FE) MG tablet Take 325 mg by mouth every other day.     Marland Kitchen  FLUoxetine (PROZAC) 20 MG capsule Take two in the am and one in the evening 90 capsule 2  . Fluticasone-Salmeterol (ADVAIR DISKUS) 250-50 MCG/DOSE AEPB inhale 1 dose by mouth twice a day 60 each 4  . folic acid (FOLVITE) 557 MCG tablet Take 400 mcg by mouth daily.    . furosemide (LASIX) 40 MG tablet take 1 tablet by mouth once daily 30 tablet 4  . gabapentin (NEURONTIN) 300 MG capsule take 1 capsule by mouth at bedtime 30 capsule 3  . glipiZIDE (GLIPIZIDE XL) 10 MG 24 hr tablet Two tablets once daily with breakfast 60 tablet 5  . Insulin Glargine (LANTUS SOLOSTAR) 100 UNIT/ML Solostar Pen Inject 40 Units into the skin daily at 10 pm. 15 mL 11  . Insulin Pen Needle 31G X 5 MM MISC Use once daily to inject insulin dx E11.65 50 each 5  . ipratropium-albuterol (DUONEB) 0.5-2.5 (3) MG/3ML SOLN Take 3 mLs by nebulization 4 (four) times daily as needed.     . levalbuterol (XOPENEX HFA) 45 MCG/ACT inhaler Inhale 1-2 puffs into the lungs as needed.      Marland Kitchen levothyroxine (SYNTHROID, LEVOTHROID) 100 MCG  tablet take 1 tablet by mouth once daily 90 tablet 1  . lisinopril (PRINIVIL,ZESTRIL) 20 MG tablet Take 1 tablet (20 mg total) by mouth daily. 30 tablet 1  . montelukast (SINGULAIR) 10 MG tablet take 1 tablet by mouth at bedtime 30 tablet 4  . polyethylene glycol (MIRALAX / GLYCOLAX) packet Take 17 g by mouth daily as needed for mild constipation. 14 each 0  . potassium chloride 20 MEQ/15ML (10%) solution TAKE 1 AND 1/2 TEASPOONFUL BY MOUTH ONCE DAILY 240 mL 2  . rosuvastatin (CRESTOR) 20 MG tablet take 1 tablet by mouth once daily 90 tablet 1  . Vitamin D, Ergocalciferol, (DRISDOL) 50000 units CAPS capsule take 1 capsule by mouth EVERY 7 DAYS 4 capsule 11  . zolpidem (AMBIEN) 10 MG tablet Take 1 tablet (10 mg total) by mouth at bedtime. 30 tablet 2   No current facility-administered medications for this visit.     Previous Psychotropic Medications:  Medication Dose   Wellbutrin-caused irritability                       Substance Abuse History in the last 12 months: Substance Age of 1st Use Last Use Amount Specific Type  Nicotine   Smokes 3 packs of cigarettes per day   Alcohol      Cannabis      Opiates      Cocaine      Methamphetamines      LSD      Ecstasy      Benzodiazepines      Caffeine      Inhalants      Others:                          Medical Consequences of Substance Abuse: smoking is making her COPD much worse  Legal Consequences of Substance Abuse: none  Family Consequences of Substance Abuse: none  Blackouts:  No DT's:  No Withdrawal Symptoms:  No None  Social History: Current Place of Residence: [place] of Birth: [place] Family Members: 2 sisters Marital Status:  Widowed Children: none   Relationships:  Education:  Dentist Problems/Performance:  Religious Beliefs/Practices: Christian History of Abuse: none Energy manager History:  None. Legal History:  none  Hobbies/Interests: listening to gospel music  Family History:   Family History  Problem Relation Age of Onset  . Hypertension Mother   . Stroke Mother   . Cancer Father        lung   . Hypertension Sister   . Depression Sister   . Alcohol abuse Sister   . Hypertension Sister   . Depression Sister   . Cancer Sister   . Diabetes Sister   . Depression Sister   . Cancer Sister        renal   . Kidney failure Brother   . Kidney failure Unknown        family history   . Hypertension Unknown        family history     Mental Status Examination/Evaluation: Objective:  Appearance:  using portable oxygen, fair hygiene hygiene, bright and alert   Eye Contact::  Good  Speech:  Clear and Coherent  Volume:  Decreased  Mood:good  Affect: A little subdued   Thought Process:  Circumstantial  Orientation:  Full (Time, Place, and Person)  Thought Content:  Rumination   Suicidal Thoughts:  No  Homicidal Thoughts:  No  Judgement:  Fair  Insight:  Fair  Psychomotor Activity:  Decreased  Akathisia:  No  Handed:  Right  AIMS (if indicated):    Assets:  Communication Skills Desire for Improvement Social Support    Laboratory/X-Ray Psychological Evaluation(s)   reviewed in chart     Assessment:  Axis I: Major Depression, Recurrent severe  AXIS I Major Depression, Recurrent severe  AXIS II Deferred  AXIS III Past Medical History:  Diagnosis Date  . Anxiety   . COPD (chronic obstructive pulmonary disease) ([place])   . Depression 2008  . Diabetes mellitus, type 1 1983   type 2 diabetes  . DJD (degenerative joint disease)    of the spine   . GERD (gastroesophageal reflux disease)   . Hyperlipidemia 2007  . Hypertension 1983  . Hypothyroidism 2004   left thyroidectomy  . Insomnia   . Obesity      AXIS IV problems related to social environment  AXIS V 41-50 serious symptoms   Treatment Plan/Recommendations:  Plan of Care: medication management  Laboratory:    Psychotherapy:   Medications: she'll continue Prozac 60 mg daily for depression and Xanax 1 mg 4 times a day for anxietyAs well as Ambien 10 mg at bedtime for sleep   Routine PRN Medications:  No  Consultations:   Safety Concerns:  She denies thoughts or plans of self-harm  Other:she'll return in 3 months     Levonne Spiller, MD 7/20/20189:39 AM    Patient ID: Maris Berger, female   DOB: Aug 21, 1950, 66 y.o.   MRN: [id number]

## 2016-12-22 ENCOUNTER — Other Ambulatory Visit: Payer: Self-pay | Admitting: Family Medicine

## 2016-12-22 ENCOUNTER — Telehealth: Payer: Self-pay | Admitting: Family Medicine

## 2016-12-22 DIAGNOSIS — N6489 Other specified disorders of breast: Secondary | ICD-10-CM

## 2016-12-22 DIAGNOSIS — R928 Other abnormal and inconclusive findings on diagnostic imaging of breast: Secondary | ICD-10-CM

## 2016-12-22 NOTE — Telephone Encounter (Signed)
-----   Message from Fayrene Helper, MD sent at 12/21/2016  9:36 AM EDT ----- Please let patient  know that her recent mammogram is not reported as entirely normal.  The radiology dept will call her to schedule further imaging studies.  We are unable to schedule this for her.  She should  Contact the radiology department about this if she has not received a call to arrange the additional test(s) needed within 2 weeks of her mammogram.

## 2016-12-22 NOTE — Telephone Encounter (Signed)
Patient informed of message below, verbalized understanding.  

## 2016-12-24 ENCOUNTER — Ambulatory Visit (HOSPITAL_COMMUNITY): Admission: RE | Admit: 2016-12-24 | Payer: Medicare Other | Source: Ambulatory Visit

## 2016-12-25 ENCOUNTER — Other Ambulatory Visit: Payer: Self-pay | Admitting: Family Medicine

## 2016-12-26 ENCOUNTER — Other Ambulatory Visit: Payer: Self-pay | Admitting: Family Medicine

## 2016-12-29 ENCOUNTER — Ambulatory Visit: Payer: Self-pay

## 2016-12-30 ENCOUNTER — Encounter (HOSPITAL_COMMUNITY): Payer: Self-pay

## 2017-01-02 IMAGING — DX DG TIBIA/FIBULA 2V*L*
2 series · 3 of 3 positions shown · non-contrast
Comparison: No recent prior.

CLINICAL DATA: Fall.

EXAM:
LEFT TIBIA AND FIBULA - 2 VIEW

[Series 2: tibia ap · 0.14mm/px · 2 of 2 slices shown]
[im 1/2]
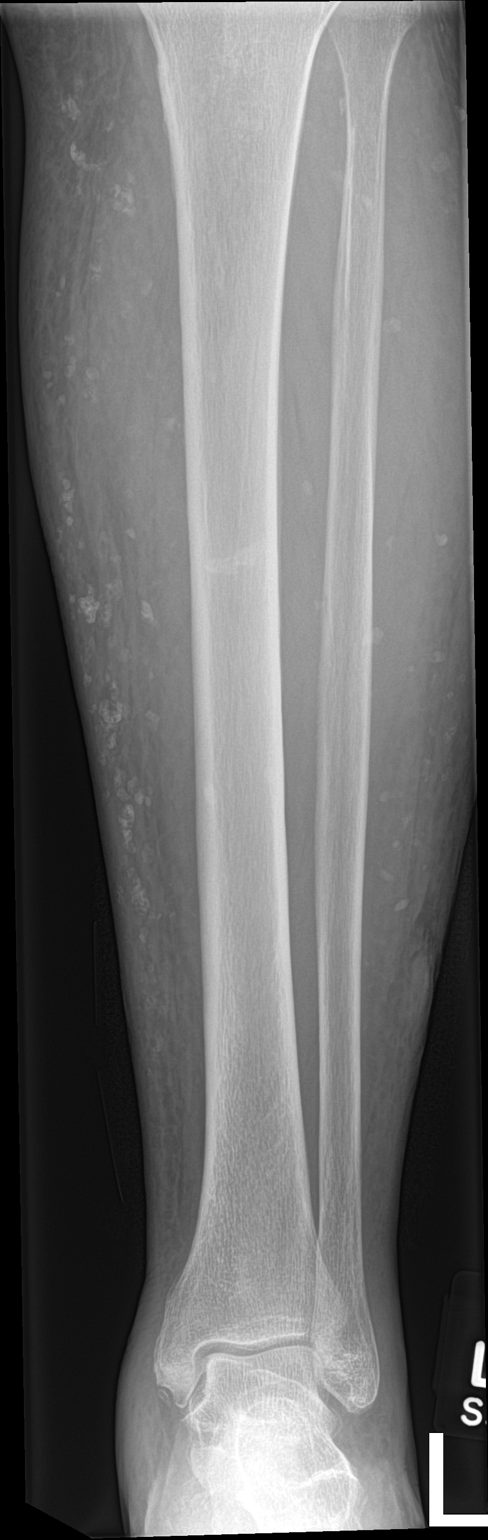
[im 2/2]
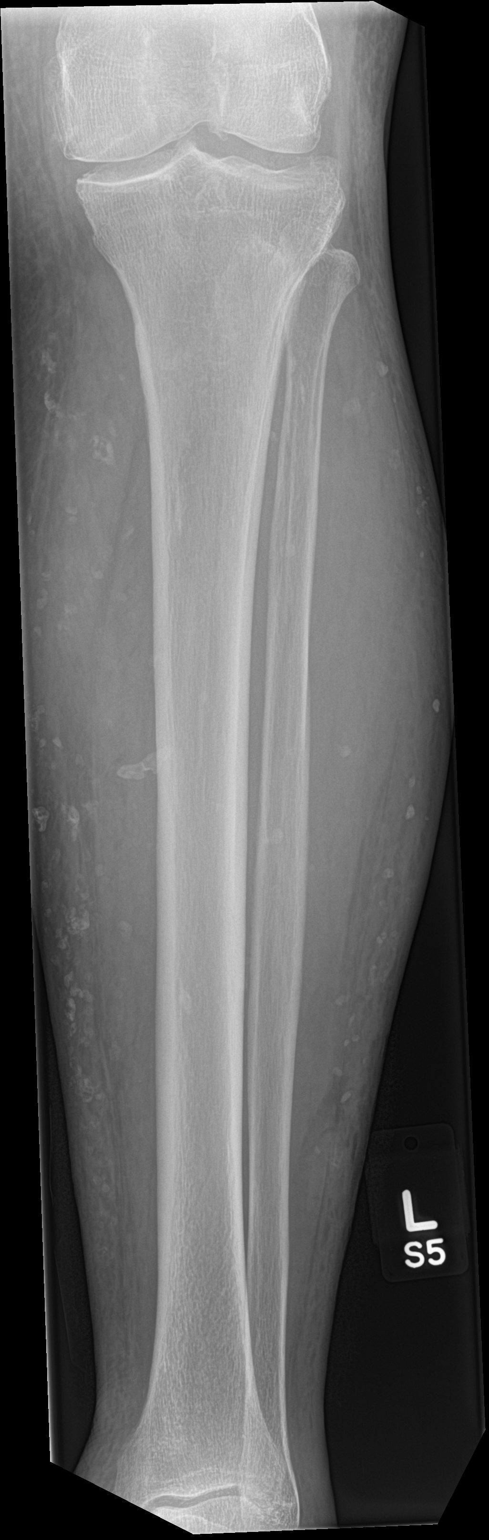

[tibia lat]
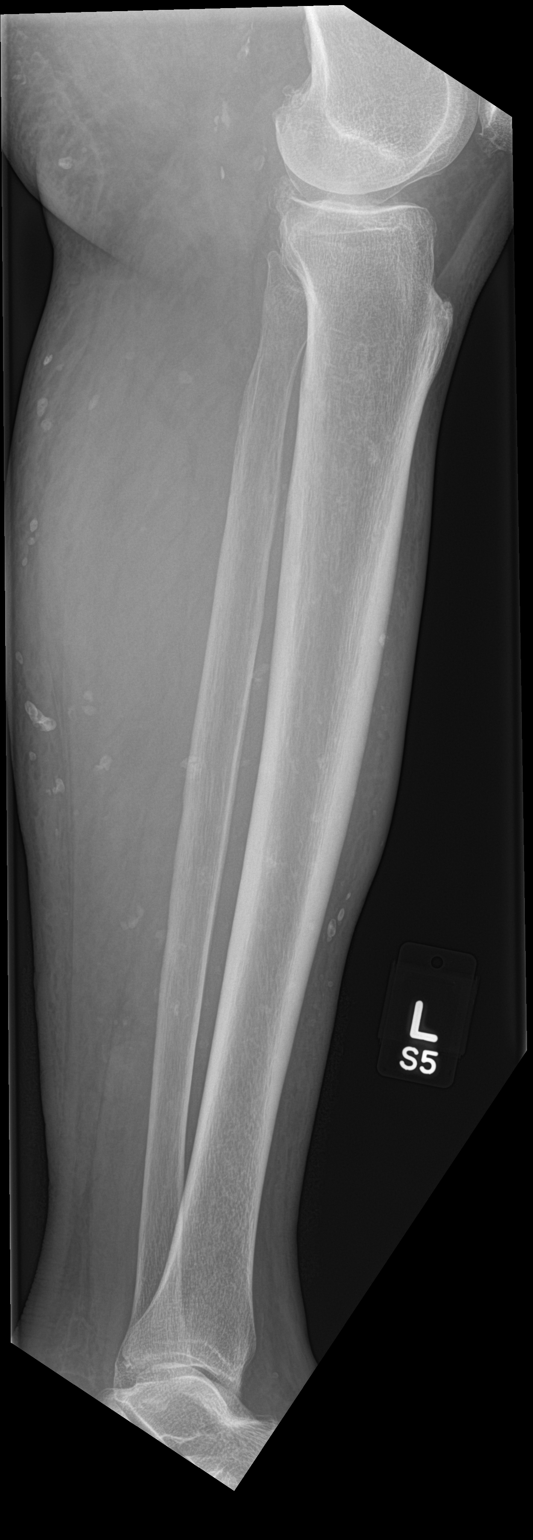

[3 of 3 positions shown; findings below may reference images not displayed]

FINDINGS: No acute bony or joint abnormality identified. No evidence of
fracture or dislocation. Degenerative changes left knee and ankle.
Soft tissue venous calcifications noted. Soft tissue ulceration
noted over the lateral aspect of the lower extremity. No underlying
bony lesion identified .
IMPRESSION: 1. No acute bony abnormality identified. Degenerative changes left
knee and ankle.

2. Soft tissue ulceration noted over the lateral aspect of the lower
extremity. No underlying bony lesion identified.

## 2017-01-03 ENCOUNTER — Other Ambulatory Visit: Payer: Self-pay | Admitting: Family Medicine

## 2017-01-06 ENCOUNTER — Encounter (HOSPITAL_COMMUNITY): Payer: Self-pay

## 2017-01-10 ENCOUNTER — Other Ambulatory Visit: Payer: Self-pay | Admitting: Family Medicine

## 2017-01-12 ENCOUNTER — Other Ambulatory Visit: Payer: Self-pay | Admitting: Family Medicine

## 2017-01-12 DIAGNOSIS — R928 Other abnormal and inconclusive findings on diagnostic imaging of breast: Secondary | ICD-10-CM

## 2017-01-12 DIAGNOSIS — N6489 Other specified disorders of breast: Secondary | ICD-10-CM

## 2017-01-16 ENCOUNTER — Other Ambulatory Visit: Payer: Self-pay | Admitting: Family Medicine

## 2017-01-16 ENCOUNTER — Ambulatory Visit
Admission: RE | Admit: 2017-01-16 | Discharge: 2017-01-16 | Disposition: A | Discharge status: Home / Self Care | Payer: Medicare Other | Source: Ambulatory Visit | Attending: Family Medicine | Admitting: Family Medicine

## 2017-01-16 DIAGNOSIS — R928 Other abnormal and inconclusive findings on diagnostic imaging of breast: Secondary | ICD-10-CM | POA: Diagnosis not present

## 2017-01-16 DIAGNOSIS — N6489 Other specified disorders of breast: Secondary | ICD-10-CM

## 2017-01-21 ENCOUNTER — Other Ambulatory Visit: Payer: Self-pay | Admitting: Family Medicine

## 2017-01-21 ENCOUNTER — Ambulatory Visit: Payer: Self-pay | Admitting: Family Medicine

## 2017-01-21 DIAGNOSIS — E785 Hyperlipidemia, unspecified: Secondary | ICD-10-CM | POA: Diagnosis not present

## 2017-01-21 DIAGNOSIS — I10 Essential (primary) hypertension: Secondary | ICD-10-CM | POA: Diagnosis not present

## 2017-01-21 DIAGNOSIS — E0865 Diabetes mellitus due to underlying condition with hyperglycemia: Secondary | ICD-10-CM | POA: Diagnosis not present

## 2017-01-21 DIAGNOSIS — E0821 Diabetes mellitus due to underlying condition with diabetic nephropathy: Secondary | ICD-10-CM | POA: Diagnosis not present

## 2017-01-21 DIAGNOSIS — Z794 Long term (current) use of insulin: Secondary | ICD-10-CM | POA: Diagnosis not present

## 2017-01-21 NOTE — Telephone Encounter (Signed)
Seen 6 [phone] 18

## 2017-01-22 LAB — COMPLETE METABOLIC PANEL WITH GFR
ALT: 5 U/L — ABNORMAL LOW (ref 6–29)
AST: 12 U/L (ref 10–35)
Albumin: 3.5 g/dL — ABNORMAL LOW (ref 3.6–5.1)
Alkaline Phosphatase: 86 U/L (ref 33–130)
BUN: 13 mg/dL (ref 7–25)
CALCIUM: 9.1 mg/dL (ref 8.6–10.4)
CHLORIDE: 102 mmol/L (ref 98–110)
CO2: 32 mmol/L (ref 20–32)
Creat: 0.94 mg/dL (ref 0.50–0.99)
GFR, Est African American: 74 mL/min (ref >=60)
GFR, Est Non African American: 64 mL/min (ref >=60)
Glucose, Bld: 139 mg/dL — ABNORMAL HIGH (ref 65–99)
POTASSIUM: 4 mmol/L (ref 3.5–5.3)
Sodium: 141 mmol/L (ref 135–146)
Total Bilirubin: 0.3 mg/dL (ref 0.2–1.2)
Total Protein: 6.3 g/dL (ref 6.1–8.1)

## 2017-01-22 LAB — HEMOGLOBIN A1C
Hgb A1c MFr Bld: 7.8 % — ABNORMAL HIGH (ref <5.7)
MEAN PLASMA GLUCOSE: 177 mg/dL

## 2017-01-22 LAB — LIPID PANEL
CHOL/HDL RATIO: 2.4 ratio (ref <5.0)
CHOLESTEROL: 115 mg/dL (ref <200)
HDL: 47 mg/dL — ABNORMAL LOW (ref >50)
LDL CALC: 53 mg/dL (ref <100)
Triglycerides: 73 mg/dL (ref <150)
VLDL: 15 mg/dL (ref <30)

## 2017-01-22 LAB — TSH: TSH: 1.81 mIU/L

## 2017-01-27 ENCOUNTER — Ambulatory Visit: Payer: Self-pay | Admitting: Family Medicine

## 2017-01-27 ENCOUNTER — Encounter: Payer: Self-pay | Admitting: Family Medicine

## 2017-01-27 ENCOUNTER — Telehealth: Payer: Self-pay | Admitting: Family Medicine

## 2017-01-27 NOTE — Telephone Encounter (Signed)
Noted, hBA1C improved, needs flu vaccine

## 2017-01-27 NOTE — Telephone Encounter (Signed)
Patient left message on nurse line. She states that she has an appt at 10 this morning but she had just woken up and would miss the appt today. She states that she is doing okay, and says 'thank you' for the card. She also thinks she got some call about an a1c, but her phone has so much static that she could not understand. She will call back later for another appt.  Callback# [phone]

## 2017-02-17 ENCOUNTER — Encounter: Payer: Self-pay | Admitting: Family Medicine

## 2017-02-17 ENCOUNTER — Ambulatory Visit (INDEPENDENT_AMBULATORY_CARE_PROVIDER_SITE_OTHER): Payer: Medicare Other | Admitting: Family Medicine

## 2017-02-17 VITALS — BP 120/70 | HR 81 | Temp 98.9°F | Resp 18 | Ht 63.0 in | Wt 240.8 lb

## 2017-02-17 DIAGNOSIS — IMO0001 Reserved for inherently not codable concepts without codable children: Secondary | ICD-10-CM

## 2017-02-17 DIAGNOSIS — E038 Other specified hypothyroidism: Secondary | ICD-10-CM

## 2017-02-17 DIAGNOSIS — F17200 Nicotine dependence, unspecified, uncomplicated: Secondary | ICD-10-CM | POA: Diagnosis not present

## 2017-02-17 DIAGNOSIS — E785 Hyperlipidemia, unspecified: Secondary | ICD-10-CM

## 2017-02-17 DIAGNOSIS — Z794 Long term (current) use of insulin: Secondary | ICD-10-CM | POA: Diagnosis not present

## 2017-02-17 DIAGNOSIS — E559 Vitamin D deficiency, unspecified: Secondary | ICD-10-CM

## 2017-02-17 DIAGNOSIS — E119 Type 2 diabetes mellitus without complications: Secondary | ICD-10-CM | POA: Diagnosis not present

## 2017-02-17 DIAGNOSIS — I10 Essential (primary) hypertension: Secondary | ICD-10-CM

## 2017-02-17 DIAGNOSIS — F17210 Nicotine dependence, cigarettes, uncomplicated: Secondary | ICD-10-CM | POA: Diagnosis not present

## 2017-02-17 DIAGNOSIS — Z23 Encounter for immunization: Secondary | ICD-10-CM

## 2017-02-17 NOTE — Progress Notes (Signed)
[name]     MRN: [id number]      DOB: March 21, 1951   HPI [name] is here for follow up and re-evaluation of chronic medical conditions, medication management and review of any available recent lab and radiology data.  Preventive health is updated, specifically  Cancer screening and Immunization.   Questions or concerns regarding consultations or procedures which the PT has had in the interim are  Addressed.Recently saw pulmonary, had chest scan, and has more questions about the result than answers, a PET scan is recommended, she seems to have no knowledge of this, so I strongly recommend she return to Dr Luan Pulling to further address She continues to smoke , no quit plans , and recently buried her sister to lung cancer The PT denies any adverse reactions to current medications since the last visit.    ROS Denies recent fever or chills. Denies sinus pressure, nasal congestion, ear pain or sore throat. Denies chest congestion, productive cough or wheezing. Denies chest pains, palpitations and leg swelling Denies abdominal pain, nausea, vomiting,diarrhea or constipation.   Denies dysuria, frequency, hesitancy or incontinence. Denies uncontrolled  joint pain, swelling and limitation in mobility. Denies headaches, seizures, numbness, or tingling. Denies depression, anxiety or insomnia. Denies skin break down or rash.   PE  BP 120/70 (BP Location: Left Arm, Patient Position: Sitting, Cuff Size: Normal)   Pulse 81   Temp 98.9 F (37.2 C) (Other (Comment))   Resp 18   Ht 5\' 3"  (1.6 m)   Wt 240 lb 12 oz (109.2 kg)   SpO2 94%   BMI 42.65 kg/m   Patient alert and oriented and in no cardiopulmonary distress.  HEENT: No facial asymmetry, EOMI,   oropharynx pink and moist.  Neck supple no JVD, no mass.  Chest: Clear to auscultation bilaterally.Decreasedair entry throughout  CVS: S1, S2 no murmurs, no S3.Regular rate.  ABD: Soft non tender.   Ext: No edema  MS: Decreased   ROM spine, shoulders, hips and knees.  Skin: Intact, no ulcerations or rash noted.  Psych: Good eye contact, normal affect. Memory intact not anxious or depressed appearing.  CNS: CN 2-12 intact, power,  normal throughout.no focal deficits noted.   Assessment & Plan Uncontrolled diabetes mellitus with diabetic nephropathy ([place]) Improved [name] is reminded of the importance of commitment to daily physical activity for 30 minutes or more, as able and the need to limit carbohydrate intake to 30 to 60 grams per meal to help with blood sugar control.   The need to take medication as prescribed, test blood sugar as directed, and to call between visits if there is a concern that blood sugar is uncontrolled is also discussed.   [name] is reminded of the importance of daily foot exam, annual eye examination, and good blood sugar, blood pressure and cholesterol control.  Diabetic Labs Latest Ref Rng & Units 01/21/2017 10/25/2016 10/24/2016 10/22/2016 07/19/2016  HbA1c <5.7 % 7.8(H) - - 9.4(H) 8.4(H)  Microalbumin Not estab mg/dL - - - - -  Micro/Creat Ratio <30 mcg/mg creat - - - - -  Chol <200 mg/dL 115 - - - 148  HDL >50 mg/dL 47(L) - - - 63  Calc LDL <100 mg/dL 53 - - - 78  Triglycerides <150 mg/dL 73 - - - 36  Creatinine 0.50 - 0.99 mg/dL 0.94 1.18(H) 1.08(H) 1.04(H) 1.07(H)   BP/Weight 02/17/2017 11/03/2016 10/27/2016 10/24/2016 10/23/2016 07/29/2016 3/57/0177  Systolic BP 939 030 092 - 110 120  372  Diastolic BP 70 74 71 - 80 72 46  Wt. (Lbs) 240.75 250 - 245 245 245.8 -  BMI 42.65 44.29 - 43.4 43.4 43.54 -  Some encounter information is confidential and restricted. Go to Review Flowsheets activity to see all data.   Foot/eye exam completion dates Latest Ref Rng & Units 07/01/2016 06/06/2016  Eye Exam No Retinopathy - No Retinopathy  Foot exam Order - - -  Foot Form Completion - Done -   Updated lab needed at/ before next visit.     Hyperlipidemia LDL goal <100 Controlled, no  change in medication Hyperlipidemia:Low fat diet discussed and encouraged.   Lipid Panel  Lab Results  Component Value Date   CHOL 115 01/21/2017   HDL 47 (L) 01/21/2017   LDLCALC 53 01/21/2017   TRIG 73 01/21/2017   CHOLHDL 2.4 01/21/2017   Needs to increase exercise to improve HDL    Hypothyroidism Controlled, no change in medication   NICOTINE ADDICTION Patient is asked and  confirms current  Nicotine use.  Five to seven minutes of time is spent in counseling the patient of the need to quit smoking  Advice to quit is delivered clearly specifically in reducing the risk of developing heart disease, having a stroke, or of developing all types of cancer, especially lung and oral cancer. Improvement in breathing and exercise tolerance and quality of life is also discussed, as is the economic benefit.  Assessment of willingness to quit or to make an attempt to quit is made and documented  Assistance in quit attempt is made with several and varied options presented, based on patient's desire and need. These include  literature, local classes available, 1800 QUIT NOW number, OTC and prescription medication.  The GOAL to be NICOTINE FREE is re emphasized.  The patient has set a personal goal of either reduction or discontinuation and follow up is arranged between 6 an 16 weeks.    Morbid obesity ([place]) Improved. Patient re-educated about  the importance of commitment to a  minimum of 150 minutes of exercise per week.  The importance of healthy food choices with portion control discussed. Encouraged to start a food diary, count calories and to consider  joining a support group. Sample diet sheets offered. Goals set by the patient for the next several months.   Weight /BMI 02/17/2017 11/03/2016 10/24/2016  WEIGHT 240 lb 12 oz 250 lb 245 lb  HEIGHT 5\' 3"  5\' 3"  5\' 3"   BMI 42.65 kg/m2 44.29 kg/m2 43.4 kg/m2  Some encounter information is confidential and restricted. Go to Review  Flowsheets activity to see all data.

## 2017-02-17 NOTE — Assessment & Plan Note (Signed)
[name] is reminded of the importance of commitment to daily physical activity for 30 minutes or more, as able and the need to limit carbohydrate intake to 30 to 60 grams per meal to help with blood sugar control.   The need to take medication as prescribed, test blood sugar as directed, and to call between visits if there is a concern that blood sugar is uncontrolled is also discussed.   [name] is reminded of the importance of daily foot exam, annual eye examination, and good blood sugar, blood pressure and cholesterol control.  Diabetic Labs Latest Ref Rng & Units 01/21/2017 10/25/2016 10/24/2016 10/22/2016 07/19/2016  HbA1c <5.7 % 7.8(H) - - 9.4(H) 8.4(H)  Microalbumin Not estab mg/dL - - - - -  Micro/Creat Ratio <30 mcg/mg creat - - - - -  Chol <200 mg/dL 115 - - - 148  HDL >50 mg/dL 47(L) - - - 63  Calc LDL <100 mg/dL 53 - - - 78  Triglycerides <150 mg/dL 73 - - - 36  Creatinine 0.50 - 0.99 mg/dL 0.94 1.18(H) 1.08(H) 1.04(H) 1.07(H)   BP/Weight 02/17/2017 11/03/2016 10/27/2016 10/24/2016 10/23/2016 07/29/2016 0/98/1191  Systolic BP 478 295 621 - 308 657 846  Diastolic BP 70 74 71 - 80 72 46  Wt. (Lbs) 240.75 250 - 245 245 245.8 -  BMI 42.65 44.29 - 43.4 43.4 43.54 -  Some encounter information is confidential and restricted. Go to Review Flowsheets activity to see all data.   Foot/eye exam completion dates Latest Ref Rng & Units 07/01/2016 06/06/2016  Eye Exam No Retinopathy - No Retinopathy  Foot exam Order - - -  Foot Form Completion - Done -   Updated lab needed at/ before next visit.

## 2017-02-17 NOTE — Assessment & Plan Note (Signed)
Controlled, no change in medication  

## 2017-02-17 NOTE — Patient Instructions (Addendum)
F/u in 4.5 months, , call if you need me sooner  HBA1C , non fasty chem 7 and EGFR  An tSH and Vit D 1 week before f/uin January  FLU vaccine today, great!  Congrats  onn excerllnet bP, and improved blood sugar and cholesterol  PLease schedule an appointment with Dr Luan Pulling to further discuss the chest scan since you are unclear as to the recommendation for PET scan that is written/ recommended by the radiologist  PLEASE commit to stopping smoking!!!, you can do this  Thank you  for choosing [facility]. We consider it a privelige to serve you.  Delivering excellent health care in a caring and  compassionate way is our goal.  Partnering with you,  so that together we can achieve this goal is our strategy.      Steps to Quit Smoking Smoking tobacco can be bad for your health. It can also affect almost every organ in your body. Smoking puts you and people around you at risk for many serious long-lasting (chronic) diseases. Quitting smoking is hard, but it is one of the best things that you can do for your health. It is never too late to quit. What are the benefits of quitting smoking? When you quit smoking, you lower your risk for getting serious diseases and conditions. They can include:  Lung cancer or lung disease.  Heart disease.  Stroke.  Heart attack.  Not being able to have children (infertility).  Weak bones (osteoporosis) and broken bones (fractures).  If you have coughing, wheezing, and shortness of breath, those symptoms may get better when you quit. You may also get sick less often. If you are pregnant, quitting smoking can help to lower your chances of having a baby of low birth weight. What can I do to help me quit smoking? Talk with your doctor about what can help you quit smoking. Some things you can do (strategies) include:  Quitting smoking totally, instead of slowly cutting back how much you smoke over a period of time.  Going to in-person  counseling. You are more likely to quit if you go to many counseling sessions.  Using resources and support systems, such as: ? Database administrator with a Social worker. ? Phone quitlines. ? Careers information officer. ? Support groups or group counseling. ? Text messaging programs. ? Mobile phone apps or applications.  Taking medicines. Some of these medicines may have nicotine in them. If you are pregnant or breastfeeding, do not take any medicines to quit smoking unless your doctor says it is okay. Talk with your doctor about counseling or other things that can help you.  Talk with your doctor about using more than one strategy at the same time, such as taking medicines while you are also going to in-person counseling. This can help make quitting easier. What things can I do to make it easier to quit? Quitting smoking might feel very hard at first, but there is a lot that you can do to make it easier. Take these steps:  Talk to your family and friends. Ask them to support and encourage you.  Call phone quitlines, reach out to support groups, or work with a Social worker.  Ask people who smoke to not smoke around you.  Avoid places that make you want (trigger) to smoke, such as: ? Bars. ? Parties. ? Smoke-break areas at work.  Spend time with people who do not smoke.  Lower the stress in your life. Stress can make you want  to smoke. Try these things to help your stress: ? Getting regular exercise. ? Deep-breathing exercises. ? Yoga. ? Meditating. ? Doing a body scan. To do this, close your eyes, focus on one area of your body at a time from head to toe, and notice which parts of your body are tense. Try to relax the muscles in those areas.  Download or buy apps on your mobile phone or tablet that can help you stick to your quit plan. There are many free apps, such as QuitGuide from the [employer] Office manager for Disease Control and Prevention). You can find more support from smokefree.gov and other  websites.  This information is not intended to replace advice given to you by your health care provider. Make sure you discuss any questions you have with your health care provider. Document Released: 03/15/2009 Document Revised: 01/15/2016 Document Reviewed: 10/03/2014 Elsevier Interactive Patient Education  2018 [employer].  Steps to Quit Smoking Smoking tobacco can be bad for your health. It can also affect almost every organ in your body. Smoking puts you and people around you at risk for many serious long-lasting (chronic) diseases. Quitting smoking is hard, but it is one of the best things that you can do for your health. It is never too late to quit. What are the benefits of quitting smoking? When you quit smoking, you lower your risk for getting serious diseases and conditions. They can include:  Lung cancer or lung disease.  Heart disease.  Stroke.  Heart attack.  Not being able to have children (infertility).  Weak bones (osteoporosis) and broken bones (fractures).  If you have coughing, wheezing, and shortness of breath, those symptoms may get better when you quit. You may also get sick less often. If you are pregnant, quitting smoking can help to lower your chances of having a baby of low birth weight. What can I do to help me quit smoking? Talk with your doctor about what can help you quit smoking. Some things you can do (strategies) include:  Quitting smoking totally, instead of slowly cutting back how much you smoke over a period of time.  Going to in-person counseling. You are more likely to quit if you go to many counseling sessions.  Using resources and support systems, such as: ? Database administrator with a Social worker. ? Phone quitlines. ? Careers information officer. ? Support groups or group counseling. ? Text messaging programs. ? Mobile phone apps or applications.  Taking medicines. Some of these medicines may have nicotine in them. If you are pregnant or  breastfeeding, do not take any medicines to quit smoking unless your doctor says it is okay. Talk with your doctor about counseling or other things that can help you.  Talk with your doctor about using more than one strategy at the same time, such as taking medicines while you are also going to in-person counseling. This can help make quitting easier. What things can I do to make it easier to quit? Quitting smoking might feel very hard at first, but there is a lot that you can do to make it easier. Take these steps:  Talk to your family and friends. Ask them to support and encourage you.  Call phone quitlines, reach out to support groups, or work with a Social worker.  Ask people who smoke to not smoke around you.  Avoid places that make you want (trigger) to smoke, such as: ? Bars. ? Parties. ? Smoke-break areas at work.  Spend time with people who  do not smoke.  Lower the stress in your life. Stress can make you want to smoke. Try these things to help your stress: ? Getting regular exercise. ? Deep-breathing exercises. ? Yoga. ? Meditating. ? Doing a body scan. To do this, close your eyes, focus on one area of your body at a time from head to toe, and notice which parts of your body are tense. Try to relax the muscles in those areas.  Download or buy apps on your mobile phone or tablet that can help you stick to your quit plan. There are many free apps, such as QuitGuide from the [employer] Office manager for Disease Control and Prevention). You can find more support from smokefree.gov and other websites.  This information is not intended to replace advice given to you by your health care provider. Make sure you discuss any questions you have with your health care provider. Document Released: 03/15/2009 Document Revised: 01/15/2016 Document Reviewed: 10/03/2014 Elsevier Interactive Patient Education  2018 [employer].

## 2017-02-17 NOTE — Assessment & Plan Note (Signed)
Improved. Patient re-educated about  the importance of commitment to a  minimum of 150 minutes of exercise per week.  The importance of healthy food choices with portion control discussed. Encouraged to start a food diary, count calories and to consider  joining a support group. Sample diet sheets offered. Goals set by the patient for the next several months.   Weight /BMI 02/17/2017 11/03/2016 10/24/2016  WEIGHT 240 lb 12 oz 250 lb 245 lb  HEIGHT 5\' 3"  5\' 3"  5\' 3"   BMI 42.65 kg/m2 44.29 kg/m2 43.4 kg/m2  Some encounter information is confidential and restricted. Go to Review Flowsheets activity to see all data.

## 2017-02-17 NOTE — Assessment & Plan Note (Signed)
Controlled, no change in medication Hyperlipidemia:Low fat diet discussed and encouraged.   Lipid Panel  Lab Results  Component Value Date   CHOL 115 01/21/2017   HDL 47 (L) 01/21/2017   LDLCALC 53 01/21/2017   TRIG 73 01/21/2017   CHOLHDL 2.4 01/21/2017   Needs to increase exercise to improve HDL

## 2017-02-17 NOTE — Assessment & Plan Note (Signed)

## 2017-03-16 ENCOUNTER — Ambulatory Visit (HOSPITAL_COMMUNITY): Payer: Self-pay | Admitting: Psychiatry

## 2017-03-31 ENCOUNTER — Encounter (HOSPITAL_COMMUNITY): Payer: Self-pay | Admitting: Psychiatry

## 2017-03-31 ENCOUNTER — Ambulatory Visit (INDEPENDENT_AMBULATORY_CARE_PROVIDER_SITE_OTHER): Payer: Medicare Other | Admitting: Psychiatry

## 2017-03-31 VITALS — BP 135/91 | HR 105 | Ht 63.0 in | Wt 233.0 lb

## 2017-03-31 DIAGNOSIS — F17210 Nicotine dependence, cigarettes, uncomplicated: Secondary | ICD-10-CM | POA: Diagnosis not present

## 2017-03-31 DIAGNOSIS — F333 Major depressive disorder, recurrent, severe with psychotic symptoms: Secondary | ICD-10-CM | POA: Diagnosis not present

## 2017-03-31 DIAGNOSIS — M2550 Pain in unspecified joint: Secondary | ICD-10-CM

## 2017-03-31 DIAGNOSIS — Z736 Limitation of activities due to disability: Secondary | ICD-10-CM

## 2017-03-31 DIAGNOSIS — M549 Dorsalgia, unspecified: Secondary | ICD-10-CM | POA: Diagnosis not present

## 2017-03-31 DIAGNOSIS — R0602 Shortness of breath: Secondary | ICD-10-CM

## 2017-03-31 DIAGNOSIS — Z811 Family history of alcohol abuse and dependence: Secondary | ICD-10-CM | POA: Diagnosis not present

## 2017-03-31 DIAGNOSIS — Z818 Family history of other mental and behavioral disorders: Secondary | ICD-10-CM | POA: Diagnosis not present

## 2017-03-31 DIAGNOSIS — Z634 Disappearance and death of family member: Secondary | ICD-10-CM

## 2017-03-31 MED ORDER — ALPRAZOLAM 1 MG PO TABS
ORAL_TABLET | ORAL | 2 refills | Status: DC
Start: 2017-03-31 — End: 2017-06-30

## 2017-03-31 MED ORDER — FLUOXETINE HCL 20 MG PO CAPS: ORAL_CAPSULE | ORAL | 2 refills | Status: DC

## 2017-03-31 MED ORDER — ZOLPIDEM TARTRATE 10 MG PO TABS: 10.0000 mg | ORAL_TABLET | Freq: Every day | ORAL | 2 refills | Status: DC

## 2017-03-31 NOTE — Progress Notes (Signed)
[facility] MD/PA/NP OP Progress Note  03/31/2017 8:48 AM [name]  MRN:  [id number]  Chief Complaint:  Chief Complaint    Depression; Anxiety; Follow-up     HPI: this patient is a 66 year old widowed black female who lives alone in [place]. She has no children. She is on disability.  The patient was referred by Dr. Moshe Cipro her primary care physician and Dr. Jefm Miles her psychologist for further evaluation and treatment of major depression and anxiety.  The patient states that she's been depressed since her teen years. She's not really sure why. When she was in college she went through severe depression and had to quit. She's had this on and off through the years but really not receive treatment until a couple years ago after her husband died. Her primary care physician has put her on Prozac and Xanax which have helped to some degree.however recently she has been getting more depressed. She has severe COPD and is on oxygen. Yet, she continues to smoke THREE PACKS PER DAY which she claims is secondary to her depressed mood.  Recently she's gone through more deaths including her sister in 2014 a niece in 2014. Another sister was recently diagnosed with cancer. The patient has gotten overwhelmed by all this and claims it's getting hard for her to just get out of bed and function. She has no energy, she's been shaky and having tremors in her hands, she complains of short-term memory loss.her sleep is variable and Ambien sometimes helps. Her appetite is up and down. Sometimes she hears the voices of her deceased relatives or "feels her spirits." She's never been to a psychiatric hospitalization  But has been seeing Dr. Jefm Miles in our office for several years. She has never been suicidal and states she is very "spiritual and values life. She stays away from people but she has a god niece and  God sister who check on her faithfully.she denies any other psychotic symptoms and does not use drugs or  alcohol  Patient returns after 3 months.  She states that another sister died over the summer from lung cancer.  The patient herself has cut way back on her smoking and states that sometimes she goes a whole week without a cigarette.  She is still on oxygen.  She has a lung nodule that needs to be explored further with a PET scan and she needs to call Dr. Luan Pulling about this and I reminded her of this today.  Despite these issues she states that her mood is stable.  She denies any psychotic symptoms.  She is sleeping well with Ambien and her anxiety is under good control with the Xanax.  She denies being depressed or suicidal her energy is good and she is getting out and shopping and doing other things with family.  She still drives her own vehicle.  She feels that the Prozac has really helped her mood Visit Diagnosis:    ICD-10-CM   1. Severe recurrent major depressive disorder with psychotic features ([facility]) F33.3     Past Psychiatric History: None  Past Medical History:  Past Medical History:  Diagnosis Date  . Anxiety   . COPD (chronic obstructive pulmonary disease) ([facility])   . Depression 2008  . Diabetes mellitus, type 1 1983   type 2 diabetes  . DJD (degenerative joint disease)    of the spine   . GERD (gastroesophageal reflux disease)   . Hyperlipidemia 2007  . Hypertension 1983  . Hypothyroidism 2004  left thyroidectomy  . Insomnia   . Obesity     Past Surgical History:  Procedure Laterality Date  . APPENDECTOMY    . bilatal great toenail removed , complicated by MRSA in rigt toenail  May 2011  . BREAST EXCISIONAL BIOPSY Right    20+ yrs ago  . TONSILLECTOMY  childhood   . TOTAL ABDOMINAL HYSTERECTOMY W/ BILATERAL SALPINGOOPHORECTOMY    . tracheotomy secondary to failed attempt and lung biopsy      Family Psychiatric History: One sister with depression  Family History:  Family History  Problem Relation Age of Onset  . Hypertension Mother   . Stroke Mother   . Cancer  Father        lung   . Hypertension Sister   . Depression Sister   . Alcohol abuse Sister   . Hypertension Sister   . Depression Sister   . Cancer Sister   . Diabetes Sister   . Depression Sister   . Cancer Sister        renal   . Kidney failure Brother   . Kidney failure Unknown        family history   . Hypertension Unknown        family history     Social History:  Social History   Social History  . Marital status: Widowed    Spouse name: N/A  . Number of children: N/A  . Years of education: N/A   Occupational History  . disable     Social History Main Topics  . Smoking status: Current Every Day Smoker    Packs/day: 0.50    Years: 44.00    Types: Cigarettes  . Smokeless tobacco: Never Used  . Alcohol use No  . Drug use: No  . Sexual activity: Not Currently   Other Topics Concern  . None   Social History Narrative  . None    Allergies:  Allergies  Allergen Reactions  . Codeine     REACTION: nausea    Metabolic Disorder Labs: Lab Results  Component Value Date   HGBA1C 7.8 (H) 01/21/2017   MPG 177 01/21/2017   MPG 223 10/22/2016   No results found for: PROLACTIN Lab Results  Component Value Date   CHOL 115 01/21/2017   TRIG 73 01/21/2017   HDL 47 (L) 01/21/2017   CHOLHDL 2.4 01/21/2017   VLDL 15 01/21/2017   LDLCALC 53 01/21/2017   LDLCALC 78 07/19/2016   Lab Results  Component Value Date   TSH 1.81 01/21/2017   TSH 0.777 10/25/2016    Therapeutic Level Labs: No results found for: LITHIUM No results found for: VALPROATE No components found for:  CBMZ  Current Medications: Current Outpatient Prescriptions  Medication Sig Dispense Refill  . acetaminophen (TYLENOL) 325 MG tablet Take 650 mg by mouth every 6 (six) hours as needed.    . ALPRAZolam (XANAX) 1 MG tablet TAKE ONE TABLET FOUR TIMES A DAY 120 tablet 2  . amLODipine (NORVASC) 5 MG tablet take 1 tablet by mouth once daily 90 tablet 1  . aspirin (ASPIRIN LOW DOSE) 81 MG EC  tablet Take 81 mg by mouth daily.      . calcium carbonate (TUMS) 500 MG chewable tablet Chew 1 tablet by mouth as needed.      . cloNIDine (CATAPRES) 0.2 MG tablet take 1 tablet by mouth at bedtime 90 tablet 1  . esomeprazole (NEXIUM) 40 MG capsule take 1 tablet every morning 90 capsule 1  .  ferrous sulfate 325 (65 FE) MG tablet Take 325 mg by mouth every other day.     Marland Kitchen FLUoxetine (PROZAC) 20 MG capsule Take two in the am and one in the evening 90 capsule 2  . Fluticasone-Salmeterol (ADVAIR DISKUS) 250-50 MCG/DOSE AEPB inhale 1 dose by mouth twice a day 60 each 4  . folic acid (FOLVITE) 270 MCG tablet Take 400 mcg by mouth daily.    . furosemide (LASIX) 40 MG tablet take 1 tablet by mouth once daily 30 tablet 4  . gabapentin (NEURONTIN) 300 MG capsule take 1 capsule by mouth at bedtime 30 capsule 3  . glipiZIDE (GLIPIZIDE XL) 10 MG 24 hr tablet Two tablets once daily with breakfast 60 tablet 5  . Insulin Glargine (LANTUS SOLOSTAR) 100 UNIT/ML Solostar Pen Inject 40 Units into the skin daily at 10 pm. 15 mL 11  . Insulin Pen Needle 31G X 5 MM MISC Use once daily to inject insulin dx E11.65 50 each 5  . ipratropium-albuterol (DUONEB) 0.5-2.5 (3) MG/3ML SOLN Take 3 mLs by nebulization 4 (four) times daily as needed.     . levalbuterol (XOPENEX HFA) 45 MCG/ACT inhaler Inhale 1-2 puffs into the lungs as needed.      Marland Kitchen levothyroxine (SYNTHROID, LEVOTHROID) 100 MCG tablet take 1 tablet once daily 90 tablet 1  . lisinopril (PRINIVIL,ZESTRIL) 20 MG tablet take 1 tablet by mouth once daily 90 tablet 1  . montelukast (SINGULAIR) 10 MG tablet take 1 tablet by mouth at bedtime 30 tablet 4  . rosuvastatin (CRESTOR) 20 MG tablet take 1 tablet by mouth once daily 90 tablet 1  . zolpidem (AMBIEN) 10 MG tablet Take 1 tablet (10 mg total) by mouth at bedtime. 30 tablet 2   No current facility-administered medications for this visit.      Musculoskeletal: Strength & Muscle Tone: decreased Gait & Station:  unsteady Patient leans: N/A  Psychiatric Specialty Exam: Review of Systems  Respiratory: Positive for shortness of breath.   Musculoskeletal: Positive for back pain and joint pain.  All other systems reviewed and are negative.   Blood pressure (!) 135/91, pulse (!) 105, height 5\' 3"  (1.6 m), weight 233 lb (105.7 kg).Body mass index is 41.27 kg/m.  General Appearance: Casual and Fairly Groomed  Eye Contact:  Good  Speech:  Clear and Coherent  Volume:  Normal  Mood:  Euthymic  Affect:  Congruent  Thought Process:  Goal Directed  Orientation:  Full (Time, Place, and Person)  Thought Content: WDL   Suicidal Thoughts:  No  Homicidal Thoughts:  No  Memory:  Immediate;   Good Recent;   Good Remote;   Fair  Judgement:  Fair  Insight:  Fair  Psychomotor Activity:  Decreased  Concentration:  Concentration: Fair and Attention Span: Fair  Recall:  Good  Fund of Knowledge: Good  Language: Good  Akathisia:  No  Handed:  Right  AIMS (if indicated): not done  Assets:  Communication Skills Desire for Improvement Resilience Social Support Talents/Skills  ADL's:  Intact  Cognition: WNL  Sleep:  Good   Screenings: Mini-Mental     Office Visit from 12/28/2014 in [facility]  Total Score (max 30 points )  24    PHQ2-9     Office Visit from 07/01/2016 in [facility] Office Visit from 12/28/2015 in [facility] Office Visit from 11/29/2014 in [facility]  PHQ-2 Total Score  0  2  2  PHQ-9 Total Score  -  18  7       Assessment and Plan: Patient is a 66 year old black female with a long history of depression and anxiety.  She states that the Prozac has helped her depression considerably still elevated maintained at 60 mg daily.  She will continue Xanax 1 mg 4 times daily for anxiety and Ambien 10 mg at bedtime for sleep.  She will return to see me in 3 months   Levonne Spiller, MD 03/31/2017, 8:48 AM

## 2017-04-20 ENCOUNTER — Other Ambulatory Visit: Payer: Self-pay

## 2017-05-04 ENCOUNTER — Ambulatory Visit: Payer: Self-pay

## 2017-05-05 ENCOUNTER — Encounter: Payer: Self-pay | Admitting: Family Medicine

## 2017-05-06 ENCOUNTER — Other Ambulatory Visit (HOSPITAL_COMMUNITY): Payer: Self-pay | Admitting: Psychiatry

## 2017-05-06 MED ORDER — ZOLPIDEM TARTRATE 10 MG PO TABS: 10.0000 mg | ORAL_TABLET | Freq: Every day | ORAL | 2 refills | Status: DC

## 2017-05-11 ENCOUNTER — Telehealth (HOSPITAL_COMMUNITY): Payer: Self-pay | Admitting: *Deleted

## 2017-05-11 NOTE — Telephone Encounter (Signed)
Prior authorization for Ambien has been approved until 05/11/2018 per cover my meds.

## 2017-05-11 NOTE — Telephone Encounter (Signed)
Prior authorization for Ambien received. Submitted online with cover my meds. Awaiting response.

## 2017-05-27 ENCOUNTER — Other Ambulatory Visit: Payer: Self-pay

## 2017-05-27 MED ORDER — MONTELUKAST SODIUM 10 MG PO TABS: 10.0000 mg | ORAL_TABLET | Freq: Every day | ORAL | 4 refills | Status: DC

## 2017-05-27 MED ORDER — GABAPENTIN 300 MG PO CAPS: 300.0000 mg | ORAL_CAPSULE | Freq: Every day | ORAL | 3 refills | Status: DC

## 2017-05-29 ENCOUNTER — Telehealth (HOSPITAL_COMMUNITY): Payer: Self-pay | Admitting: *Deleted

## 2017-05-29 NOTE — Telephone Encounter (Signed)
returned phone call to patient regarding appointment date and time, but no answer, no option to leave voice message.

## 2017-06-29 IMAGING — MR MR HEAD W/O CM
8 of 10 series · 38 of 48 positions shown · non-contrast
Comparison: 07/18/2016 CT head.  01/06/2013 MRI of the brain.

CLINICAL DATA: 65 y/o F; altered mental status. Right-sided
weakness.

EXAM:
MRI HEAD WITHOUT CONTRAST
MRA HEAD WITHOUT CONTRAST
TECHNIQUE: Multiplanar, multiecho pulse sequences of the brain and surrounding
structures were obtained without intravenous contrast. Angiographic
images of the head were obtained using MRA technique without
contrast.

[Series 3: DWI · axial · 3.0mm · 0.69mm/px · z∈[-78,+79]mm · 7 of 54 slices shown (1 of 4)]
[im 1/54]
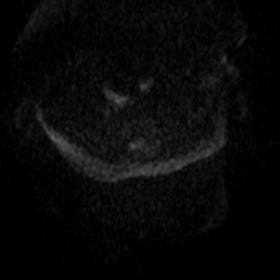
[im 9/54]
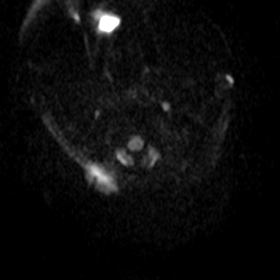
[im 18/54]
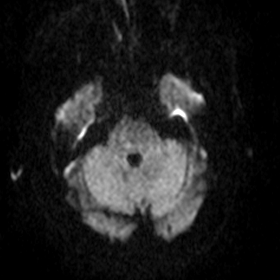
[im 27/54]
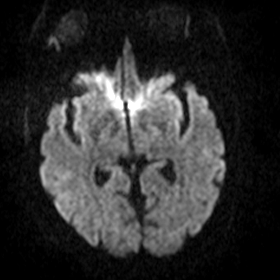
[im 36/54]
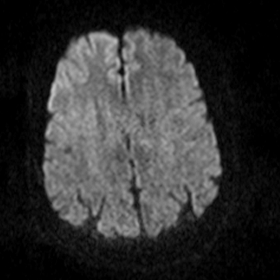
[im 45/54]
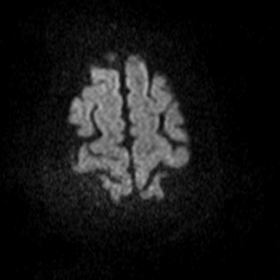
[im 54/54]
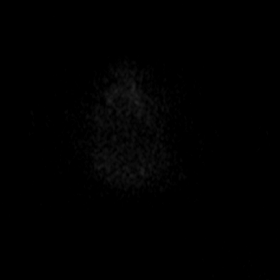

[Series 4: DWI · axial · 3.0mm · 0.73mm/px · z∈[-82,+78]mm · 7 of 52 slices shown (2 of 4)]
[im 1/52]
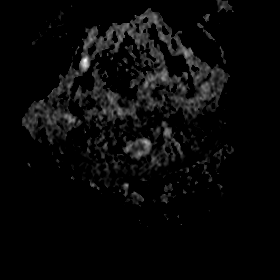
[im 9/52]
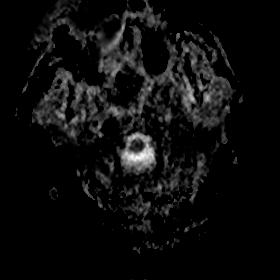
[im 18/52]
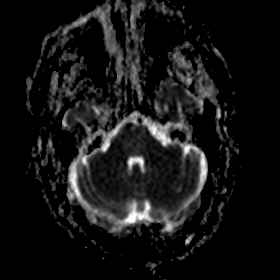
[im 26/52]
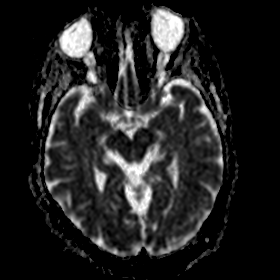
[im 35/52]
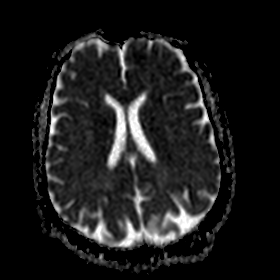
[im 43/52]
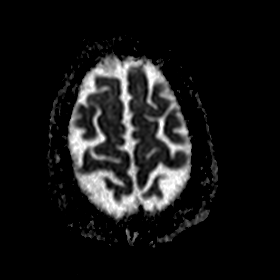
[im 52/52]
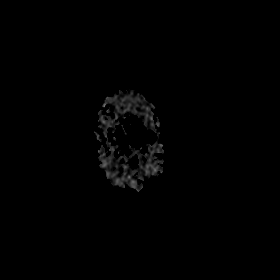

[Series 5: DWI · coronal · 5.0mm · 0.48mm/px · 4 of 34 slices shown (3 of 4)]
[im 1/34]
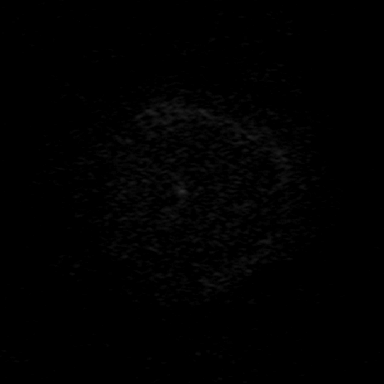
[im 12/34]
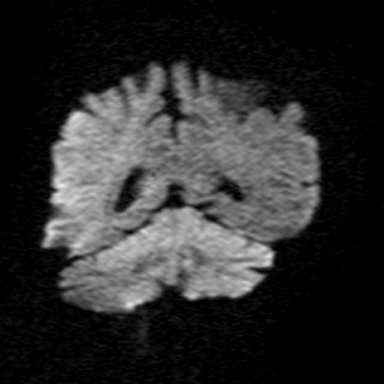
[im 23/34]
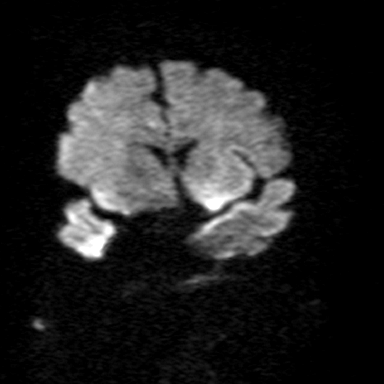
[im 34/34]
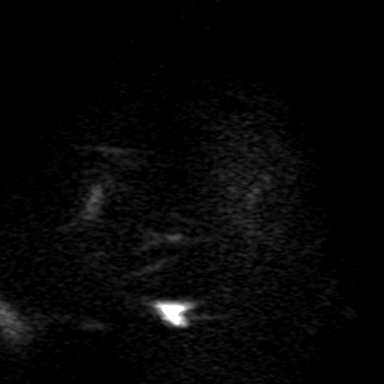

[Series 6: DWI · coronal · 5.0mm · 0.50mm/px · 4 of 34 slices shown (4 of 4)]
[im 1/34]
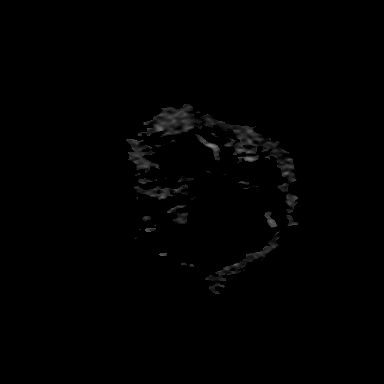
[im 12/34]
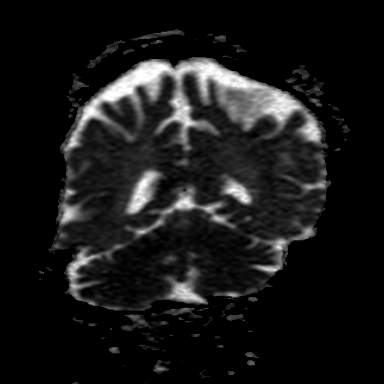
[im 23/34]
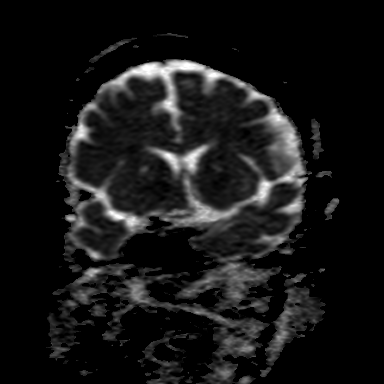
[im 34/34]
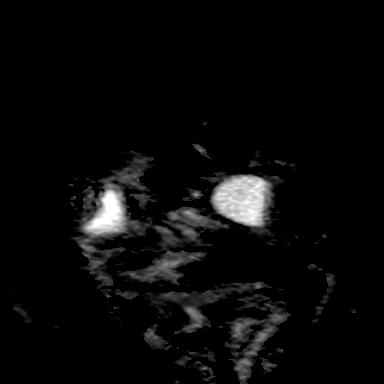

[Series 7: T2 · axial · 5.0mm · 0.66mm/px · z∈[-73,+68]mm · 3 of 23 slices shown (1 of 2)]
[im 1/23]
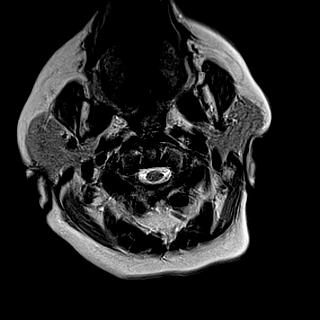
[im 12/23]
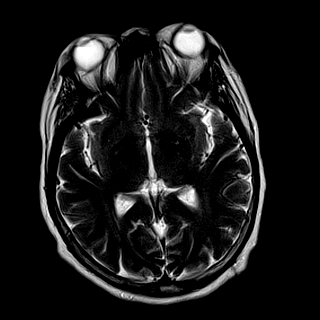
[im 23/23]
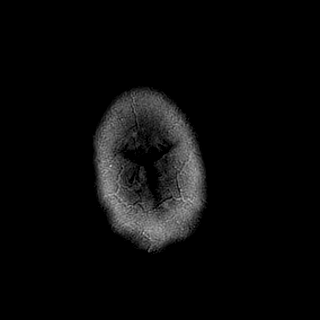

[Series 8: FLAIR · axial · 5.0mm · 0.84mm/px · z∈[-73,+68]mm · 3 of 23 slices shown]
[im 1/23]
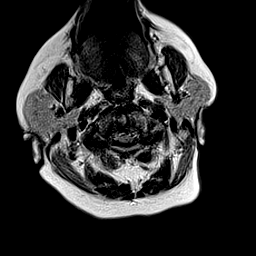
[im 12/23]
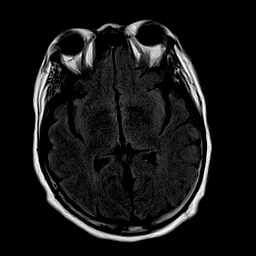
[im 23/23]
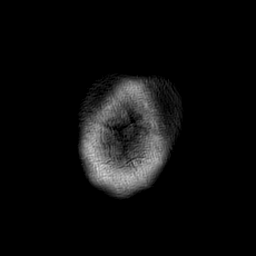

[Series 9: T1 · axial · 2.0mm · 0.42mm/px · z∈[-74,+25]mm · 6 of 77 slices shown]
[im 1/77]
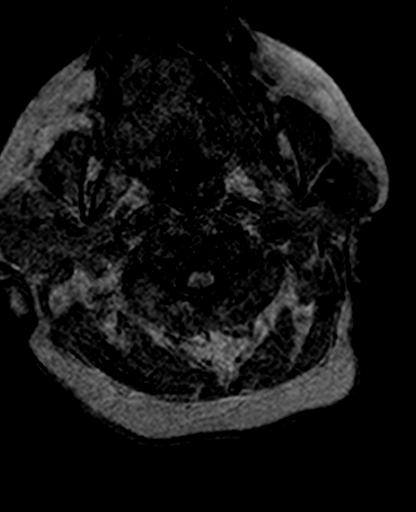
[im 9/77]
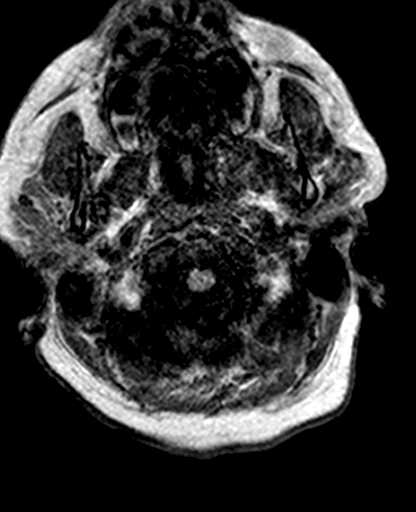
[im 26/77]
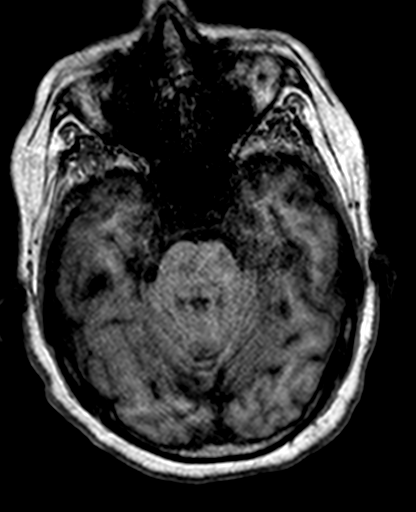
[im 34/77]
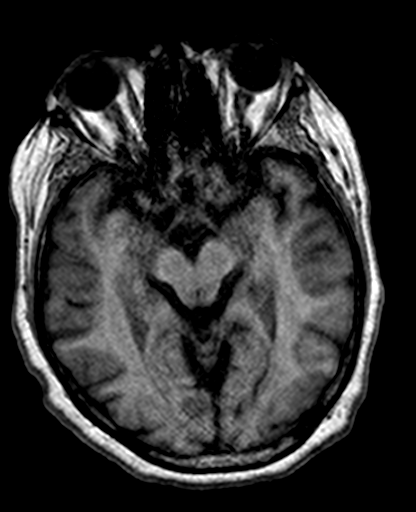
[im 43/77]
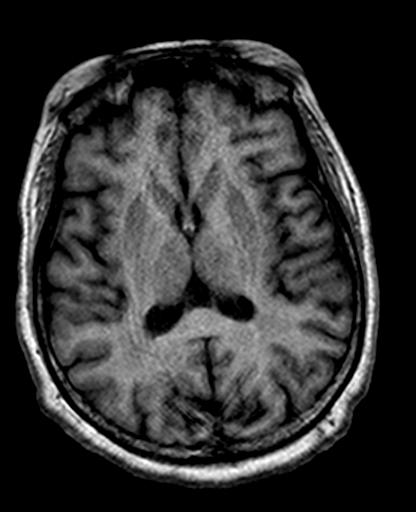
[im 51/77]
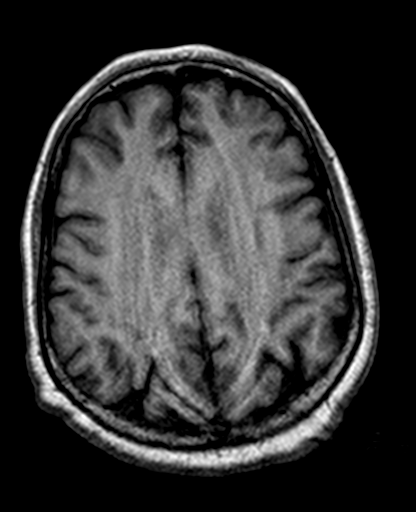

[Series 11: T2 · coronal · 5.0mm · 0.65mm/px · 4 of 28 slices shown (2 of 2)]
[im 1/28]
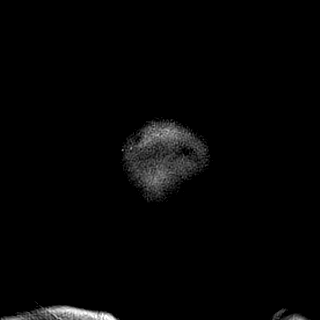
[im 10/28]
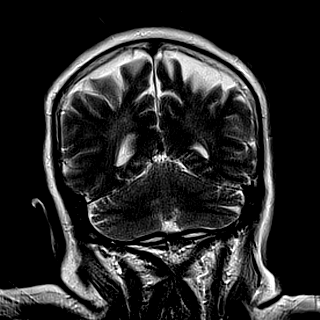
[im 19/28]
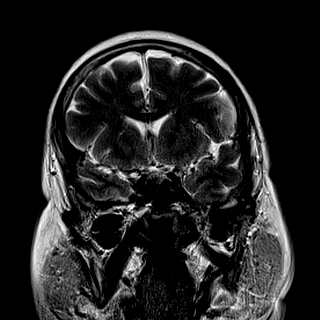
[im 28/28]
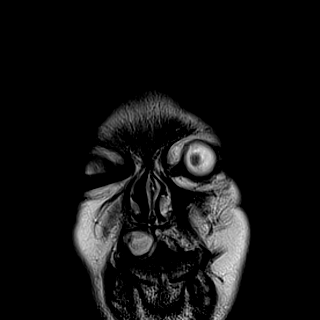

[38 of 48 positions shown; findings below may reference images not displayed]

FINDINGS: MRI HEAD FINDINGS

Brain: No acute infarction, hemorrhage, hydrocephalus, extra-axial
collection or mass lesion. Stable T2 FLAIR hyperintense signal
abnormality in subcortical and periventricular white matter probably
representing chronic microvascular ischemic changes. Stable mild
brain parenchymal volume loss.

Vascular: As below.

Skull and upper cervical spine: Normal marrow signal.

Sinuses/Orbits: Stable partial opacification of right mastoid tip.
Otherwise visualized paranasal sinuses and mastoid air cells
demonstrate normal signal. Orbits are unremarkable.

Other: T2 hyperintense and T1 isointense lesion centered in the
right maxillary alveolar bone adjacent to incisor and premolars
measuring 22 x 17 x 17 mm (AP x ML x CC series 7, image 3 and series
2, image 8).

MRA HEAD FINDINGS

Moderate motion artifact, suboptimal evaluation for subtle stenosis
or aneurysm.

Internal carotid arteries: Patent. Poor flow related signal within
the right paraclinoid segment probably representing high-grade
underlying stenosis. 4 mm laterally directed aneurysm arising from
the left distal cavernous ICA segment.

Anterior cerebral arteries:  Patent.

Middle cerebral arteries: Patent. The M1 segment lumen irregularity
and stenosis of right M2 inferior division.

Anterior communicating artery: Patent.

Posterior communicating arteries: Patent. Left-greater-than-right P1
segment irregularity with probable areas of stenosis on the left.

Posterior cerebral arteries:  Patent.

Basilar artery: Patent. Short segment of mild stenosis of the
midbasilar artery.

Vertebral arteries:  Patent.
IMPRESSION: 1. No acute intracranial abnormality.
2. Stable mild chronic microvascular ischemic changes and
parenchymal volume loss of the brain.
3. New T2 hyperintense lesion of uncertain significance centered
within the right maxillary alveolar bone measuring up to 22 mm
adjacent to several teeth. Further characterization with facial CT
is recommended on a nonemergent basis.
4. Motion degraded MRA of the head.  No large vessel occlusion.
5. Poor flow related signal in the right ICA paraclinoid segment
probably represents underlying high-grade stenosis.
6. Left distal cavernous ICA 4 mm laterally directed aneurysm.
7. Lumen irregularity with areas of mild stenosis of the anterior
and posterior circulation, suboptimally assessed due to motion
artifact, probably representing intracranial atherosclerosis.

By: Jojo Mj M.D.

## 2017-06-30 ENCOUNTER — Other Ambulatory Visit (HOSPITAL_COMMUNITY): Payer: Self-pay | Admitting: Psychiatry

## 2017-06-30 ENCOUNTER — Ambulatory Visit (HOSPITAL_COMMUNITY): Payer: Self-pay | Admitting: Psychiatry

## 2017-06-30 ENCOUNTER — Telehealth (HOSPITAL_COMMUNITY): Payer: Self-pay | Admitting: *Deleted

## 2017-06-30 MED ORDER — FLUOXETINE HCL 20 MG PO CAPS: ORAL_CAPSULE | ORAL | 2 refills | Status: DC

## 2017-06-30 MED ORDER — ALPRAZOLAM 1 MG PO TABS: ORAL_TABLET | ORAL | 2 refills | Status: DC

## 2017-06-30 MED ORDER — ZOLPIDEM TARTRATE 10 MG PO TABS: 10.0000 mg | ORAL_TABLET | Freq: Every day | ORAL | 2 refills | Status: DC

## 2017-06-30 NOTE — Telephone Encounter (Signed)
sent 

## 2017-06-30 NOTE — Telephone Encounter (Signed)
Dr Harrington Challenger Patient called to reschedule she  Has a very bad cold. Next appointment is 07/22/17. She is requesting refill which she thought she would have been able to ask for @ today's appointment.

## 2017-07-02 ENCOUNTER — Ambulatory Visit: Payer: Self-pay | Admitting: Family Medicine

## 2017-07-09 ENCOUNTER — Other Ambulatory Visit: Payer: Self-pay

## 2017-07-09 MED ORDER — CLONIDINE HCL 0.2 MG PO TABS: 0.2000 mg | ORAL_TABLET | Freq: Every day | ORAL | 0 refills | Status: DC

## 2017-07-09 MED ORDER — LEVOTHYROXINE SODIUM 100 MCG PO TABS: 100.0000 ug | ORAL_TABLET | Freq: Every day | ORAL | 0 refills | Status: DC

## 2017-07-09 MED ORDER — ROSUVASTATIN CALCIUM 20 MG PO TABS: 20.0000 mg | ORAL_TABLET | Freq: Every day | ORAL | 0 refills | Status: DC

## 2017-07-09 MED ORDER — ESOMEPRAZOLE MAGNESIUM 40 MG PO CPDR: DELAYED_RELEASE_CAPSULE | ORAL | 0 refills | Status: DC

## 2017-07-22 ENCOUNTER — Ambulatory Visit (HOSPITAL_COMMUNITY): Payer: Medicare Other | Admitting: Psychiatry

## 2017-07-29 ENCOUNTER — Ambulatory Visit (INDEPENDENT_AMBULATORY_CARE_PROVIDER_SITE_OTHER): Payer: Medicare Other | Admitting: Psychiatry

## 2017-07-29 ENCOUNTER — Encounter (HOSPITAL_COMMUNITY): Payer: Self-pay | Admitting: Psychiatry

## 2017-07-29 VITALS — BP 137/78 | HR 102 | Ht 63.0 in | Wt 232.0 lb

## 2017-07-29 DIAGNOSIS — F17210 Nicotine dependence, cigarettes, uncomplicated: Secondary | ICD-10-CM | POA: Diagnosis not present

## 2017-07-29 DIAGNOSIS — J449 Chronic obstructive pulmonary disease, unspecified: Secondary | ICD-10-CM

## 2017-07-29 DIAGNOSIS — Z736 Limitation of activities due to disability: Secondary | ICD-10-CM | POA: Diagnosis not present

## 2017-07-29 DIAGNOSIS — F419 Anxiety disorder, unspecified: Secondary | ICD-10-CM | POA: Diagnosis not present

## 2017-07-29 DIAGNOSIS — Z811 Family history of alcohol abuse and dependence: Secondary | ICD-10-CM

## 2017-07-29 DIAGNOSIS — R0602 Shortness of breath: Secondary | ICD-10-CM

## 2017-07-29 DIAGNOSIS — M2550 Pain in unspecified joint: Secondary | ICD-10-CM | POA: Diagnosis not present

## 2017-07-29 DIAGNOSIS — F333 Major depressive disorder, recurrent, severe with psychotic symptoms: Secondary | ICD-10-CM

## 2017-07-29 DIAGNOSIS — Z818 Family history of other mental and behavioral disorders: Secondary | ICD-10-CM | POA: Diagnosis not present

## 2017-07-29 DIAGNOSIS — R450 Nervousness: Secondary | ICD-10-CM

## 2017-07-29 DIAGNOSIS — Z9981 Dependence on supplemental oxygen: Secondary | ICD-10-CM | POA: Diagnosis not present

## 2017-07-29 MED ORDER — ALPRAZOLAM 1 MG PO TABS: ORAL_TABLET | ORAL | 2 refills | Status: DC

## 2017-07-29 MED ORDER — FLUOXETINE HCL 20 MG PO CAPS: ORAL_CAPSULE | ORAL | 2 refills | Status: DC

## 2017-07-29 MED ORDER — ZOLPIDEM TARTRATE 10 MG PO TABS: 10.0000 mg | ORAL_TABLET | Freq: Every day | ORAL | 2 refills | Status: DC
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
## 2017-07-29 NOTE — Progress Notes (Signed)
102

## 2017-08-04 ENCOUNTER — Telehealth: Payer: Self-pay | Admitting: Family Medicine

## 2017-08-04 NOTE — Telephone Encounter (Signed)
Patient would like for you to call her regarding refills on her medications at the newer [facility] store (formerly [facility])  Please call her when you have a moment  Thanks

## 2017-08-05 NOTE — Telephone Encounter (Signed)
meds were refilled in Feb. She hasn't been here since Sept and anything else will be refilled after her appt tomorrow

## 2017-08-05 NOTE — Telephone Encounter (Signed)
I called pt to remind of the appt on 08-06-17 and that the medicine would be addressed then

## 2017-08-06 ENCOUNTER — Ambulatory Visit: Payer: Self-pay | Admitting: Family Medicine

## 2017-08-07 ENCOUNTER — Telehealth: Payer: Self-pay

## 2017-08-07 NOTE — Telephone Encounter (Signed)
Pt called and said she was out of meds.  I told her she needed to go ahead and schedule an appt with Dr Moshe Cipro.  She said she will call us back.

## 2017-08-18 ENCOUNTER — Encounter: Payer: Self-pay | Admitting: Family Medicine

## 2017-08-18 ENCOUNTER — Ambulatory Visit (INDEPENDENT_AMBULATORY_CARE_PROVIDER_SITE_OTHER): Payer: Medicare Other | Admitting: Family Medicine

## 2017-08-18 VITALS — BP 130/78 | HR 88 | Resp 16 | Ht 63.0 in | Wt 226.0 lb

## 2017-08-18 DIAGNOSIS — E038 Other specified hypothyroidism: Secondary | ICD-10-CM | POA: Diagnosis not present

## 2017-08-18 DIAGNOSIS — E1121 Type 2 diabetes mellitus with diabetic nephropathy: Secondary | ICD-10-CM

## 2017-08-18 DIAGNOSIS — I10 Essential (primary) hypertension: Secondary | ICD-10-CM | POA: Diagnosis not present

## 2017-08-18 DIAGNOSIS — F17200 Nicotine dependence, unspecified, uncomplicated: Secondary | ICD-10-CM | POA: Diagnosis not present

## 2017-08-18 DIAGNOSIS — E1165 Type 2 diabetes mellitus with hyperglycemia: Secondary | ICD-10-CM | POA: Diagnosis not present

## 2017-08-18 DIAGNOSIS — J449 Chronic obstructive pulmonary disease, unspecified: Secondary | ICD-10-CM

## 2017-08-18 DIAGNOSIS — E785 Hyperlipidemia, unspecified: Secondary | ICD-10-CM | POA: Diagnosis not present

## 2017-08-18 DIAGNOSIS — Z91199 Patient's noncompliance with other medical treatment and regimen due to unspecified reason: Secondary | ICD-10-CM

## 2017-08-18 DIAGNOSIS — Z9119 Patient's noncompliance with other medical treatment and regimen: Secondary | ICD-10-CM | POA: Diagnosis not present

## 2017-08-18 DIAGNOSIS — IMO0002 Reserved for concepts with insufficient information to code with codable children: Secondary | ICD-10-CM

## 2017-08-18 MED ORDER — CLONIDINE HCL 0.2 MG PO TABS: ORAL_TABLET | ORAL | 3 refills | Status: AC

## 2017-08-18 MED ORDER — LISINOPRIL 5 MG PO TABS: 5.0000 mg | ORAL_TABLET | Freq: Every day | ORAL | 3 refills | Status: DC

## 2017-08-18 MED ORDER — GLIPIZIDE ER 10 MG PO TB24: 10.0000 mg | ORAL_TABLET | Freq: Every day | ORAL | 3 refills | Status: DC

## 2017-08-18 NOTE — Progress Notes (Signed)
[name]     MRN: [id number]      DOB: 10/29/1950   HPI [name] is here for follow up and re-evaluation of chronic medical conditions, medication management and review of any available recent lab and radiology data.    Eye exam  and colonoscopy still overdue, and refuses foot exam today , also dexa overdue  The PT denies any adverse reactions to current medications since the last visit.  States smoking 1 PPD was smoking 10 per day  Last Summer, no plan to quit says cigarettes make her calm Reports hearing her family who are dead talking to her , not suicidal or homicidal  Reports poor eating habits even drinks swewt drinks Uses lantus as needed, if bedtime is 200 taks 20 units, reports fbg aroiund 140 to 150 Takes one glipizide daily  ROS Denies recent fever or chills. Denies sinus pressure, nasal congestion, ear pain or sore throat. Denies chest congestion, productive cough or wheezing. Denies chest pains, palpitations and leg swelling Denies abdominal pain, nausea, vomiting,diarrhea or constipation.   Denies dysuria, frequency, hesitancy or incontinence. C/o chronic joint pain, swelling and limitation in mobility. Denies headaches, seizures, numbness, or tingling. C/o  Depression,not suicidal or homicidal, denies  anxiety or insomnia.Has benefited in the past from therapy and needs to establish with a new therapist but still on the fence about this. Denies skin break down or rash.   PE  BP 130/78   Pulse 88   Resp 16   Ht 5\' 3"  (1.6 m)   Wt 226 lb (102.5 kg)   SpO2 91%   BMI 40.03 kg/m   Patient alert and oriented and in no cardiopulmonary distress.  HEENT: No facial asymmetry, EOMI,   oropharynx pink and moist.  Neck supple no JVD, no mass.  Chest: decreased air entry ,  Scattered crackles , and few high pitched  wheezes  CVS: S1, S2 no murmurs, no S3.Regular rate.  ABD: Soft non tender.   Ext: No edema  MS: decreased  ROM spine, shoulders, hips and  knees.  Skin: Intact, no ulcerations or rash noted.  Psych: Good eye contact, normal affect. Memory intact not anxious or depressed appearing.  CNS: CN 2-12 intact, power,  normal throughout.no focal deficits noted.   Assessment & Plan  Hypothyroidism Needs updated lab, has not been taking medication prescribed  Uncontrolled diabetes mellitus with diabetic nephropathy ([place]) Needs updated lab, not taking  meds as prescribed and no dietary compliance attempted at this time either  NICOTINE ADDICTION Asked: confirms current  Cigarette smoker Advise: need to quit to prevent lung failure, already Oxygen dependant, reduce cancer risk also risk of heart disease and stroke Assses: not willing to quit now, states uses cigarettes to calm her Assist: recommend returning to therapist for psychotherapy as current behavior now is deleterious to her health, also QUIT line # provided Arrange : f/u in 3 months Time spent 5 mins  COPD, severe Remains oxygen dependent , recently treated with 2 rounds of antibiotics for lung infection by her pulmonary Doc reportedly  Non compliance with medical treatment Reports intentionally stopping prescription medication that she knows that she needs and being non compliant with the diet that she knows that she needs to follow Continues to refuse recommended cancer screening tests   Morbid obesity ([place]) Pt has lost weight , but this is likely due to uncontrolled blood sugar and is therefore un healthy weight loss unfortunately, it is certainly not by intent  Patient re-educated about  the importance of commitment to a  minimum of 150 minutes of exercise per week.     Weight /BMI 08/18/2017 02/17/2017 11/03/2016  WEIGHT 226 lb 240 lb 12 oz 250 lb  HEIGHT 5\' 3"  5\' 3"  5\' 3"   BMI 40.03 kg/m2 42.65 kg/m2 44.29 kg/m2  Some encounter information is confidential and restricted. Go to Review Flowsheets activity to see all data.

## 2017-08-18 NOTE — Patient Instructions (Addendum)
F/u in 3. Months, with foot exam, call if you need me before  Wellness is overdue with nurse please schedule  HBA1C, lipid, cmp and EGFR, TSH, CBC and vit D today   ,Please seriously consider therapy , call when you decide  I hope you want to reduce smoking next visit!  Thanks for choosing [facility], we consider it a privelige to serve you.

## 2017-08-20 ENCOUNTER — Telehealth: Payer: Self-pay

## 2017-08-20 NOTE — Telephone Encounter (Signed)
When she was here she forgot to ask you for something for her cough that is producing yellow mucus. Please advise

## 2017-08-21 ENCOUNTER — Other Ambulatory Visit: Payer: Self-pay | Admitting: Family Medicine

## 2017-08-21 MED ORDER — BENZONATATE 100 MG PO CAPS: 100.0000 mg | ORAL_CAPSULE | Freq: Two times a day (BID) | ORAL | 0 refills | Status: DC | PRN

## 2017-08-21 NOTE — Telephone Encounter (Signed)
She has just completed 2 rounds of antimitotics from Dr Luan Pulling .  She has no fever I am prescribing tessalon perles and they are being sent in now

## 2017-08-21 NOTE — Telephone Encounter (Signed)
Pt aware.

## 2017-08-30 ENCOUNTER — Encounter: Payer: Self-pay | Admitting: Family Medicine

## 2017-08-30 DIAGNOSIS — Z91199 Patient's noncompliance with other medical treatment and regimen due to unspecified reason: Secondary | ICD-10-CM | POA: Insufficient documentation

## 2017-08-30 DIAGNOSIS — Z9119 Patient's noncompliance with other medical treatment and regimen: Secondary | ICD-10-CM | POA: Insufficient documentation

## 2017-08-30 NOTE — Assessment & Plan Note (Signed)
Reports intentionally stopping prescription medication that she knows that she needs and being non compliant with the diet that she knows that she needs to follow Continues to refuse recommended cancer screening tests

## 2017-08-30 NOTE — Assessment & Plan Note (Signed)
Needs updated lab, has not been taking medication prescribed

## 2017-08-30 NOTE — Assessment & Plan Note (Signed)
Needs updated lab, not taking  meds as prescribed and no dietary compliance attempted at this time either

## 2017-08-30 NOTE — Assessment & Plan Note (Signed)
Remains oxygen dependent , recently treated with 2 rounds of antibiotics for lung infection by her pulmonary Doc reportedly

## 2017-08-30 NOTE — Assessment & Plan Note (Signed)
Pt has lost weight , but this is likely due to uncontrolled blood sugar and is therefore un healthy weight loss unfortunately, it is certainly not by intent Patient re-educated about  the importance of commitment to a  minimum of 150 minutes of exercise per week.     Weight /BMI 08/18/2017 02/17/2017 11/03/2016  WEIGHT 226 lb 240 lb 12 oz 250 lb  HEIGHT 5\' 3"  5\' 3"  5\' 3"   BMI 40.03 kg/m2 42.65 kg/m2 44.29 kg/m2  Some encounter information is confidential and restricted. Go to Review Flowsheets activity to see all data.

## 2017-08-30 NOTE — Assessment & Plan Note (Signed)
Asked: confirms current  Cigarette smoker Advise: need to quit to prevent lung failure, already Oxygen dependant, reduce cancer risk also risk of heart disease and stroke Assses: not willing to quit now, states uses cigarettes to calm her Assist: recommend returning to therapist for psychotherapy as current behavior now is deleterious to her health, also QUIT line # provided Arrange : f/u in 3 months Time spent 5 mins

## 2017-10-05 ENCOUNTER — Other Ambulatory Visit: Payer: Self-pay | Admitting: Family Medicine

## 2017-10-06 ENCOUNTER — Other Ambulatory Visit: Payer: Self-pay | Admitting: Family Medicine

## 2017-10-07 ENCOUNTER — Other Ambulatory Visit (HOSPITAL_COMMUNITY): Payer: Self-pay | Admitting: Psychiatry

## 2017-10-19 ENCOUNTER — Other Ambulatory Visit: Payer: Self-pay | Admitting: Family Medicine

## 2017-10-27 ENCOUNTER — Telehealth: Payer: Self-pay | Admitting: Family Medicine

## 2017-10-27 NOTE — Telephone Encounter (Signed)
RETURNED CALL TO PATIENT REGARDING APPT TIME/DAY. PATIENT IS AWARE OF HER SCHEDULED APPT.

## 2017-10-28 ENCOUNTER — Ambulatory Visit (HOSPITAL_COMMUNITY): Payer: Self-pay | Admitting: Psychiatry

## 2017-11-03 ENCOUNTER — Other Ambulatory Visit: Payer: Self-pay | Admitting: Family Medicine

## 2017-11-03 ENCOUNTER — Other Ambulatory Visit (HOSPITAL_COMMUNITY): Payer: Self-pay | Admitting: Psychiatry

## 2017-11-09 ENCOUNTER — Ambulatory Visit (INDEPENDENT_AMBULATORY_CARE_PROVIDER_SITE_OTHER): Payer: Medicare Other | Admitting: Psychiatry

## 2017-11-09 ENCOUNTER — Encounter (HOSPITAL_COMMUNITY): Payer: Self-pay | Admitting: Psychiatry

## 2017-11-09 VITALS — BP 167/92 | HR 135 | Ht 63.0 in | Wt 225.0 lb

## 2017-11-09 DIAGNOSIS — J449 Chronic obstructive pulmonary disease, unspecified: Secondary | ICD-10-CM | POA: Diagnosis not present

## 2017-11-09 DIAGNOSIS — Z811 Family history of alcohol abuse and dependence: Secondary | ICD-10-CM

## 2017-11-09 DIAGNOSIS — E119 Type 2 diabetes mellitus without complications: Secondary | ICD-10-CM | POA: Diagnosis not present

## 2017-11-09 DIAGNOSIS — R251 Tremor, unspecified: Secondary | ICD-10-CM

## 2017-11-09 DIAGNOSIS — Z9981 Dependence on supplemental oxygen: Secondary | ICD-10-CM | POA: Diagnosis not present

## 2017-11-09 DIAGNOSIS — R531 Weakness: Secondary | ICD-10-CM

## 2017-11-09 DIAGNOSIS — R413 Other amnesia: Secondary | ICD-10-CM | POA: Diagnosis not present

## 2017-11-09 DIAGNOSIS — M2550 Pain in unspecified joint: Secondary | ICD-10-CM | POA: Diagnosis not present

## 2017-11-09 DIAGNOSIS — E785 Hyperlipidemia, unspecified: Secondary | ICD-10-CM | POA: Diagnosis not present

## 2017-11-09 DIAGNOSIS — F333 Major depressive disorder, recurrent, severe with psychotic symptoms: Secondary | ICD-10-CM

## 2017-11-09 DIAGNOSIS — F17210 Nicotine dependence, cigarettes, uncomplicated: Secondary | ICD-10-CM | POA: Diagnosis not present

## 2017-11-09 DIAGNOSIS — R0602 Shortness of breath: Secondary | ICD-10-CM | POA: Diagnosis not present

## 2017-11-09 DIAGNOSIS — E1121 Type 2 diabetes mellitus with diabetic nephropathy: Secondary | ICD-10-CM | POA: Diagnosis not present

## 2017-11-09 DIAGNOSIS — R911 Solitary pulmonary nodule: Secondary | ICD-10-CM | POA: Diagnosis not present

## 2017-11-09 DIAGNOSIS — Z9111 Patient's noncompliance with dietary regimen: Secondary | ICD-10-CM

## 2017-11-09 DIAGNOSIS — Z818 Family history of other mental and behavioral disorders: Secondary | ICD-10-CM

## 2017-11-09 DIAGNOSIS — E1165 Type 2 diabetes mellitus with hyperglycemia: Secondary | ICD-10-CM | POA: Diagnosis not present

## 2017-11-09 DIAGNOSIS — E559 Vitamin D deficiency, unspecified: Secondary | ICD-10-CM | POA: Diagnosis not present

## 2017-11-09 DIAGNOSIS — I10 Essential (primary) hypertension: Secondary | ICD-10-CM | POA: Diagnosis not present

## 2017-11-09 DIAGNOSIS — Z634 Disappearance and death of family member: Secondary | ICD-10-CM

## 2017-11-09 MED ORDER — ARIPIPRAZOLE 2 MG PO TABS: 1.0000 mg | ORAL_TABLET | Freq: Every day | ORAL | 2 refills | Status: DC

## 2017-11-09 MED ORDER — ZOLPIDEM TARTRATE 10 MG PO TABS: ORAL_TABLET | ORAL | 2 refills | Status: DC

## 2017-11-09 MED ORDER — FLUOXETINE HCL 20 MG PO CAPS: ORAL_CAPSULE | ORAL | 2 refills | Status: DC

## 2017-11-09 MED ORDER — ALPRAZOLAM 1 MG PO TABS: ORAL_TABLET | ORAL | 2 refills | Status: DC

## 2017-11-09 NOTE — Progress Notes (Signed)
BH MD/PA/NP OP Progress Note  11/09/2017 11:04 AM [name]  MRN:  [id number]  Chief Complaint:  Chief Complaint    Depression; Anxiety; Follow-up     HPI: this patient is a 67 year old widowed black female who lives with her godson in [place]. She has no children. She is on disability.  The patient was referred by Dr. Moshe Cipro her primary care physician and Dr. Jefm Miles her psychologist for further evaluation and treatment of major depression and anxiety.  The patient states that she's been depressed since her teen years. She's not really sure why. When she was in college she went through severe depression and had to quit. She's had this on and off through the years but really not receive treatment until a couple years ago after her husband died. Her primary care physician has put her on Prozac and Xanax which have helped to some degree.however recently she has been getting more depressed. She has severe COPD and is on oxygen. Yet, she continues to smoke THREE PACKS PER DAY which she claims is secondary to her depressed mood.  Recently she's gone through more deaths including her sister in 2014 a niece in 2014. Another sister was recently diagnosed with cancer. The patient has gotten overwhelmed by all this and claims it's getting hard for her to just get out of bed and function. She has no energy, she's been shaky and having tremors in her hands, she complains of short-term memory loss.her sleep is variable and Ambien sometimes helps. Her appetite is up and down. Sometimes she hears the voices of her deceased relatives or "feels her spirits." She's never been to a psychiatric hospitalization But has been seeing Dr. Jefm Miles in our office for several years. She has never been suicidal and states she is very "spiritual and values life. She stays away from people but she has a god niece and God sister who check on her faithfully.she denies any other psychotic symptoms and does not use  drugs or alcohol  The patient returns after 3 months.  She is carrying her oxygen tank and is having difficulty breathing.  She states that she has been very worried about her sister was diagnosed with cancer.  This put her into a depression for several weeks but she did not call here to let us know.  She states that she is starting to come out of it but still has low energy.  She is making no efforts to improve her health even though she "wants to get better" she still smokes a pack a day and drinks sugary sodas all day long as well.  I offered to add Abilify to augment her antidepressant and she is agreeable to this.  She tried Rexulti in the past which seemed to help but eventually she stopped it because she thought it was worsening her shaking.  She denies suicidal ideation and states that she is going to keep going no matter what.  Visit Diagnosis:    ICD-10-CM   1. Severe recurrent major depressive disorder with psychotic features ([place]) F33.3     Past Psychiatric History: none  Past Medical History:  Past Medical History:  Diagnosis Date  . Anxiety   . COPD (chronic obstructive pulmonary disease) ([place])   . Depression 2008  . Diabetes mellitus, type 1 1983   type 2 diabetes  . DJD (degenerative joint disease)    of the spine   . GERD (gastroesophageal reflux disease)   . Hyperlipidemia 2007  . Hypertension  1983  . Hypothyroidism 2004   left thyroidectomy  . Insomnia   . Obesity     Past Surgical History:  Procedure Laterality Date  . APPENDECTOMY    . bilatal great toenail removed , complicated by MRSA in rigt toenail  May 2011  . BREAST EXCISIONAL BIOPSY Right    20+ yrs ago  . TONSILLECTOMY  childhood   . TOTAL ABDOMINAL HYSTERECTOMY W/ BILATERAL SALPINGOOPHORECTOMY    . tracheotomy secondary to failed attempt and lung biopsy      Family Psychiatric History: See below  Family History:  Family History  Problem Relation Age of Onset  . Hypertension Mother   . Stroke  Mother   . Cancer Father        lung   . Hypertension Sister   . Depression Sister   . Alcohol abuse Sister   . Hypertension Sister   . Depression Sister   . Cancer Sister   . Diabetes Sister   . Depression Sister   . Cancer Sister        renal   . Kidney failure Brother   . Kidney failure Unknown        family history   . Hypertension Unknown        family history     Social History:  Social History   Socioeconomic History  . Marital status: Widowed    Spouse name: Not on file  . Number of children: Not on file  . Years of education: Not on file  . Highest education level: Not on file  Occupational History  . Occupation: disable   Social Needs  . Financial resource strain: Not on file  . Food insecurity:    Worry: Not on file    Inability: Not on file  . Transportation needs:    Medical: Not on file    Non-medical: Not on file  Tobacco Use  . Smoking status: Current Every Day Smoker    Packs/day: 0.50    Years: 44.00    Pack years: 22.00    Types: Cigarettes  . Smokeless tobacco: Never Used  Substance and Sexual Activity  . Alcohol use: No  . Drug use: No  . Sexual activity: Not Currently  Lifestyle  . Physical activity:    Days per week: Not on file    Minutes per session: Not on file  . Stress: Not on file  Relationships  . Social connections:    Talks on phone: Not on file    Gets together: Not on file    Attends religious service: Not on file    Active member of club or organization: Not on file    Attends meetings of clubs or organizations: Not on file    Relationship status: Not on file  Other Topics Concern  . Not on file  Social History Narrative  . Not on file    Allergies:  Allergies  Allergen Reactions  . Codeine     REACTION: nausea    Metabolic Disorder Labs: Lab Results  Component Value Date   HGBA1C 7.8 (H) 01/21/2017   MPG 177 01/21/2017   MPG 223 10/22/2016   No results found for: PROLACTIN Lab Results  Component  Value Date   CHOL 115 01/21/2017   TRIG 73 01/21/2017   HDL 47 (L) 01/21/2017   CHOLHDL 2.4 01/21/2017   VLDL 15 01/21/2017   LDLCALC 53 01/21/2017   LDLCALC 78 07/19/2016   Lab Results  Component Value Date  TSH 1.81 01/21/2017   TSH 0.777 10/25/2016    Therapeutic Level Labs: No results found for: LITHIUM No results found for: VALPROATE No components found for:  CBMZ  Current Medications: Current Outpatient Medications  Medication Sig Dispense Refill  . acetaminophen (TYLENOL) 325 MG tablet Take 650 mg by mouth every 6 (six) hours as needed.    . ALPRAZolam (XANAX) 1 MG tablet TAKE 1 TABLET BY MOUTH FOUR TIMES DAILY 120 tablet 2  . aspirin (ASPIRIN LOW DOSE) 81 MG EC tablet Take 81 mg by mouth daily.      . benzonatate (TESSALON) 100 MG capsule Take 1 capsule (100 mg total) by mouth 2 (two) times daily as needed for cough. 20 capsule 0  . calcium carbonate (TUMS) 500 MG chewable tablet Chew 1 tablet by mouth as needed.      . cloNIDine (CATAPRES) 0.2 MG tablet One tablet at bedtime for blood pressure 90 tablet 3  . esomeprazole (NEXIUM) 40 MG capsule TAKE 1 CAPSULE BY MOUTH EVERY MORNING 90 capsule 0  . ferrous sulfate 325 (65 FE) MG tablet Take 325 mg by mouth every other day.     Marland Kitchen FLUoxetine (PROZAC) 20 MG capsule Take two in the am and one in the evening 90 capsule 2  . Fluticasone-Salmeterol (ADVAIR DISKUS) 250-50 MCG/DOSE AEPB inhale 1 dose by mouth twice a day 60 each 4  . folic acid (FOLVITE) 824 MCG tablet Take 400 mcg by mouth daily.    . furosemide (LASIX) 40 MG tablet take 1 tablet by mouth once daily 30 tablet 4  . gabapentin (NEURONTIN) 300 MG capsule Take 1 capsule (300 mg total) by mouth at bedtime. 30 capsule 3  . glipiZIDE (GLUCOTROL XL) 10 MG 24 hr tablet Take 1 tablet (10 mg total) by mouth daily with breakfast. Dose reduction effective 03/19/20219 90 tablet 3  . Insulin Glargine (LANTUS SOLOSTAR) 100 UNIT/ML Solostar Pen Inject 40 Units into the skin daily  at 10 pm. 15 mL 11  . Insulin Pen Needle 31G X 5 MM MISC Use once daily to inject insulin dx E11.65 50 each 5  . ipratropium-albuterol (DUONEB) 0.5-2.5 (3) MG/3ML SOLN Take 3 mLs by nebulization 4 (four) times daily as needed.     . levalbuterol (XOPENEX HFA) 45 MCG/ACT inhaler Inhale 1-2 puffs into the lungs as needed.      Marland Kitchen levothyroxine (SYNTHROID, LEVOTHROID) 100 MCG tablet TAKE 1 TABLET ONCE DAILY 90 tablet 1  . lisinopril (PRINIVIL,ZESTRIL) 5 MG tablet Take 1 tablet (5 mg total) by mouth daily. 90 tablet 3  . montelukast (SINGULAIR) 10 MG tablet TAKE 1 TABLET BY MOUTH AT BEDTIME 90 tablet 0  . rosuvastatin (CRESTOR) 20 MG tablet TAKE 1 TABLET ONCE DAILY 90 tablet 0  . Vitamin D, Ergocalciferol, (DRISDOL) 50000 units CAPS capsule TAKE 1 CAPSULE BY MOUTH ONCE WEEKLY 4 capsule 0  . zolpidem (AMBIEN) 10 MG tablet TAKE 1 TABLET(10 MG) BY MOUTH AT BEDTIME 30 tablet 2  . ARIPiprazole (ABILIFY) 2 MG tablet Take 0.5 tablets (1 mg total) by mouth daily. 30 tablet 2   No current facility-administered medications for this visit.      Musculoskeletal: Strength & Muscle Tone: decreased Gait & Station: unsteady Patient leans: N/A  Psychiatric Specialty Exam: Review of Systems  Constitutional: Positive for malaise/fatigue.  Respiratory: Positive for shortness of breath.   Musculoskeletal: Positive for joint pain.  Neurological: Positive for weakness.  Psychiatric/Behavioral: Positive for depression.  All other systems reviewed and are  negative.   Blood pressure (!) 167/92, pulse (!) 135, height 5\' 3"  (1.6 m), weight 225 lb (102.1 kg), SpO2 93 %.Body mass index is 39.86 kg/m.  General Appearance: Casual and Fairly Groomed  Eye Contact:  Good  Speech:  Clear and Coherent  Volume:  Decreased  Mood:  Dysphoric  Affect:  Constricted  Thought Process:  Goal Directed  Orientation:  Full (Time, Place, and Person)  Thought Content: Rumination   Suicidal Thoughts:  No  Homicidal Thoughts:  No   Memory:  Immediate;   Good Recent;   Good Remote;   Poor  Judgement:  Poor  Insight:  Lacking  Psychomotor Activity:  Decreased  Concentration:  Concentration: Fair and Attention Span: Fair  Recall:  Good  Fund of Knowledge: Fair  Language: Good  Akathisia:  No  Handed:  Right  AIMS (if indicated): not done  Assets:  Communication Skills Desire for Improvement Resilience Social Support Talents/Skills  ADL's:  Impaired  Cognition: WNL  Sleep:  Good   Screenings: Mini-Mental     Office Visit from 12/28/2014 in [facility]  Total Score (max 30 points )  24    PHQ2-9     Office Visit from 07/01/2016 in [facility] Office Visit from 12/28/2015 in [facility] Office Visit from 11/29/2014 in [facility]  PHQ-2 Total Score  0  2  2  PHQ-9 Total Score  -  18  7       Assessment and Plan: This patient is a 67 year old female with a history of depression.  She states her mood has worsened since she found on her sister as cancer.  The patient herself has a lung nodule which she refuses to work-up because she claims "God will take me when  the time is ready."  Does feel like the Prozac 60 mg daily and Xanax 1 mg 4 times daily have helped but I offered to add Abilify 1 mg daily for augmentation and she is willing to try this.  She will continue Ambien 10 mg at bedtime for sleep.  She again was warned about the ill effects of smoking and drinking sugary sodas while she has diabetes but she claims she is not going to change any of these habits.  She will return to see me in 6 weeks   Levonne Spiller, MD 11/09/2017, 11:04 AM

## 2017-11-10 ENCOUNTER — Telehealth (HOSPITAL_COMMUNITY): Payer: Self-pay | Admitting: *Deleted

## 2017-11-10 LAB — VITAMIN D 25 HYDROXY (VIT D DEFICIENCY, FRACTURES): Vit D, 25-Hydroxy: 38 ng/mL (ref 30–100)

## 2017-11-10 LAB — TSH: TSH: 2.34 mIU/L (ref 0.40–4.50)

## 2017-11-10 LAB — COMPLETE METABOLIC PANEL WITH GFR
AG Ratio: 1.5 (calc) (ref 1.0–2.5)
ALT: 9 U/L (ref 6–29)
AST: 16 U/L (ref 10–35)
Albumin: 3.9 g/dL (ref 3.6–5.1)
Alkaline phosphatase (APISO): 106 U/L (ref 33–130)
BUN: 14 mg/dL (ref 7–25)
CO2: 33 mmol/L — ABNORMAL HIGH (ref 20–32)
Calcium: 9.4 mg/dL (ref 8.6–10.4)
Chloride: 101 mmol/L (ref 98–110)
Creat: 0.99 mg/dL (ref 0.50–0.99)
GFR, Est African American: 69 mL/min/{1.73_m2} (ref >=60)
GFR, Est Non African American: 60 mL/min/{1.73_m2} (ref >=60)
Globulin: 2.6 g/dL (calc) (ref 1.9–3.7)
Glucose, Bld: 211 mg/dL — ABNORMAL HIGH (ref 65–139)
Potassium: 4 mmol/L (ref 3.5–5.3)
Sodium: 140 mmol/L (ref 135–146)
Total Bilirubin: 0.3 mg/dL (ref 0.2–1.2)
Total Protein: 6.5 g/dL (ref 6.1–8.1)

## 2017-11-10 LAB — CBC
HEMATOCRIT: 40.9 % (ref 35.0–45.0)
HEMOGLOBIN: 13.5 g/dL (ref 11.7–15.5)
MCH: 29.5 pg (ref 27.0–33.0)
MCHC: 33 g/dL (ref 32.0–36.0)
MCV: 89.3 fL (ref 80.0–100.0)
MPV: 9.8 fL (ref 7.5–12.5)
Platelets: 323 10*3/uL (ref 140–400)
RBC: 4.58 10*6/uL (ref 3.80–5.10)
RDW: 12.2 % (ref 11.0–15.0)
WBC: 11.5 10*3/uL — ABNORMAL HIGH (ref 3.8–10.8)

## 2017-11-10 LAB — LIPID PANEL
Cholesterol: 133 mg/dL (ref <200)
HDL: 55 mg/dL (ref >50)
LDL Cholesterol (Calc): 61 mg/dL (calc)
Non-HDL Cholesterol (Calc): 78 mg/dL (calc) (ref <130)
Total CHOL/HDL Ratio: 2.4 (calc) (ref <5.0)
Triglycerides: 83 mg/dL (ref <150)

## 2017-11-10 LAB — HEMOGLOBIN A1C
EAG (MMOL/L): 10.3 (calc)
Hgb A1c MFr Bld: 8.1 % of total Hgb — ABNORMAL HIGH (ref <5.7)
Mean Plasma Glucose: 186 (calc)

## 2017-11-10 NOTE — Telephone Encounter (Signed)
Authorization Approved for Abilify Effective: 1 Year 10/2017--- 11/2018 Patient has been notified per [employer]

## 2017-11-11 ENCOUNTER — Telehealth (HOSPITAL_COMMUNITY): Payer: Self-pay | Admitting: *Deleted

## 2017-11-11 NOTE — Telephone Encounter (Signed)
Dr Harrington Challenger Patient called after picking up her Abilify concerned that the pharmacist mentioned that the medication Isn't scored & 2 mg is the lowest dose it comes in . And that it shouldn't be cut or chewed. But she can   and that the cut would leave a lot of crumbles & she would take the larger portion throwing away the crumbles.   Patient's concern is is that will she be taking the correct amount @ 0.5 doing as Rx suggested? Or doews she   need a new script for something else. Patient is awaiting a call back # [phone] (H)

## 2017-11-11 NOTE — Telephone Encounter (Signed)
Just tell her to take the whole pill once a day

## 2017-11-11 NOTE — Telephone Encounter (Signed)
SPOKE WITH PATIENT & INFORMED PER PROVIDER : Just tell her to take the whole pill once a day

## 2017-11-24 ENCOUNTER — Encounter: Payer: Self-pay | Admitting: Family Medicine

## 2017-11-24 ENCOUNTER — Ambulatory Visit (INDEPENDENT_AMBULATORY_CARE_PROVIDER_SITE_OTHER): Payer: Medicare Other | Admitting: Family Medicine

## 2017-11-24 VITALS — BP 138/84 | HR 101 | Resp 16 | Ht 63.0 in | Wt 223.0 lb

## 2017-11-24 DIAGNOSIS — Z780 Asymptomatic menopausal state: Secondary | ICD-10-CM | POA: Diagnosis not present

## 2017-11-24 DIAGNOSIS — F17200 Nicotine dependence, unspecified, uncomplicated: Secondary | ICD-10-CM | POA: Diagnosis not present

## 2017-11-24 DIAGNOSIS — E1165 Type 2 diabetes mellitus with hyperglycemia: Secondary | ICD-10-CM

## 2017-11-24 DIAGNOSIS — F329 Major depressive disorder, single episode, unspecified: Secondary | ICD-10-CM | POA: Diagnosis not present

## 2017-11-24 DIAGNOSIS — F419 Anxiety disorder, unspecified: Secondary | ICD-10-CM | POA: Diagnosis not present

## 2017-11-24 DIAGNOSIS — F32A Depression, unspecified: Secondary | ICD-10-CM

## 2017-11-24 DIAGNOSIS — I10 Essential (primary) hypertension: Secondary | ICD-10-CM | POA: Diagnosis not present

## 2017-11-24 DIAGNOSIS — IMO0002 Reserved for concepts with insufficient information to code with codable children: Secondary | ICD-10-CM

## 2017-11-24 DIAGNOSIS — Z1331 Encounter for screening for depression: Secondary | ICD-10-CM | POA: Diagnosis not present

## 2017-11-24 DIAGNOSIS — E1121 Type 2 diabetes mellitus with diabetic nephropathy: Secondary | ICD-10-CM | POA: Diagnosis not present

## 2017-11-24 DIAGNOSIS — E038 Other specified hypothyroidism: Secondary | ICD-10-CM | POA: Diagnosis not present

## 2017-11-24 DIAGNOSIS — F17210 Nicotine dependence, cigarettes, uncomplicated: Secondary | ICD-10-CM

## 2017-11-24 MED ORDER — GLIPIZIDE ER 2.5 MG PO TB24: 2.5000 mg | ORAL_TABLET | Freq: Every day | ORAL | 3 refills | Status: DC

## 2017-11-24 NOTE — Patient Instructions (Addendum)
Wellness past due please schedule end August/ early September   MD follow up end October  Please schedule bone density test at checkout  Pls give 3 stool cards for colon screen before she leaves, needs to return them   Non fasting hBA1C, chem 7 and EGFR 1 and microalb, 1  week before October appt  Please refer to Podiatrist for foot care if she agrees , needs help with cutting her nails  Please work on reducing cigarettes to 7 per day, this will help circulation in your feet and reduce risk of cancer, lung failure , heart disease and stroke  Overalll blood work is good!   Steps to Quit Smoking Smoking tobacco can be bad for your health. It can also affect almost every organ in your body. Smoking puts you and people around you at risk for many serious long-lasting (chronic) diseases. Quitting smoking is hard, but it is one of the best things that you can do for your health. It is never too late to quit. What are the benefits of quitting smoking? When you quit smoking, you lower your risk for getting serious diseases and conditions. They can include:  Lung cancer or lung disease.  Heart disease.  Stroke.  Heart attack.  Not being able to have children (infertility).  Weak bones (osteoporosis) and broken bones (fractures).  If you have coughing, wheezing, and shortness of breath, those symptoms may get better when you quit. You may also get sick less often. If you are pregnant, quitting smoking can help to lower your chances of having a baby of low birth weight. What can I do to help me quit smoking? Talk with your doctor about what can help you quit smoking. Some things you can do (strategies) include:  Quitting smoking totally, instead of slowly cutting back how much you smoke over a period of time.  Going to in-person counseling. You are more likely to quit if you go to many counseling sessions.  Using resources and support systems, such as: ? Database administrator with a  Social worker. ? Phone quitlines. ? Careers information officer. ? Support groups or group counseling. ? Text messaging programs. ? Mobile phone apps or applications.  Taking medicines. Some of these medicines may have nicotine in them. If you are pregnant or breastfeeding, do not take any medicines to quit smoking unless your doctor says it is okay. Talk with your doctor about counseling or other things that can help you.  Talk with your doctor about using more than one strategy at the same time, such as taking medicines while you are also going to in-person counseling. This can help make quitting easier. What things can I do to make it easier to quit? Quitting smoking might feel very hard at first, but there is a lot that you can do to make it easier. Take these steps:  Talk to your family and friends. Ask them to support and encourage you.  Call phone quitlines, reach out to support groups, or work with a Social worker.  Ask people who smoke to not smoke around you.  Avoid places that make you want (trigger) to smoke, such as: ? Bars. ? Parties. ? Smoke-break areas at work.  Spend time with people who do not smoke.  Lower the stress in your life. Stress can make you want to smoke. Try these things to help your stress: ? Getting regular exercise. ? Deep-breathing exercises. ? Yoga. ? Meditating. ? Doing a body scan. To do this, close your  eyes, focus on one area of your body at a time from head to toe, and notice which parts of your body are tense. Try to relax the muscles in those areas.  Download or buy apps on your mobile phone or tablet that can help you stick to your quit plan. There are many free apps, such as QuitGuide from the [employer] Office manager for Disease Control and Prevention). You can find more support from smokefree.gov and other websites.  This information is not intended to replace advice given to you by your health care provider. Make sure you discuss any questions you have  with your health care provider. Document Released: 03/15/2009 Document Revised: 01/15/2016 Document Reviewed: 10/03/2014 Elsevier Interactive Patient Education  2018 [employer].

## 2017-11-27 ENCOUNTER — Other Ambulatory Visit: Payer: Self-pay | Admitting: Family Medicine

## 2017-11-29 ENCOUNTER — Encounter: Payer: Self-pay | Admitting: Family Medicine

## 2017-11-29 DIAGNOSIS — Z1331 Encounter for screening for depression: Secondary | ICD-10-CM | POA: Insufficient documentation

## 2017-11-29 NOTE — Assessment & Plan Note (Signed)
Controled no change in medication dose

## 2017-11-29 NOTE — Assessment & Plan Note (Signed)
Positive screen and uncontrolled despite  Being currently treated by Psychiatry,  Would definitely benefit from therapy, will need to work on getting her back into therapy

## 2017-11-29 NOTE — Assessment & Plan Note (Signed)
Controlled, no change in medication DASH diet and commitment to daily physical activity for a minimum of 30 minutes discussed and encouraged, as a part of hypertension management. The importance of attaining a healthy weight is also discussed.  BP/Weight 11/24/2017 08/18/2017 02/17/2017 11/03/2016 10/27/2016 10/24/2016 07/10/221  Systolic BP 361 224 497 530 051 - 102  Diastolic BP 84 78 70 74 71 - 80  Wt. (Lbs) 223 226 240.75 250 - 245 245  BMI 39.5 40.03 42.65 44.29 - 43.4 43.4  Some encounter information is confidential and restricted. Go to Review Flowsheets activity to see all data.

## 2017-11-29 NOTE — Assessment & Plan Note (Signed)
Improved, she is applauded on this Patient re-educated about  the importance of commitment to a  minimum of 150 minutes of exercise per week.  The importance of healthy food choices with portion control discussed. Encouraged to start a food diary, count calories and to consider  joining a support group. Sample diet sheets offered. Goals set by the patient for the next several months.   Weight /BMI 11/24/2017 08/18/2017 02/17/2017  WEIGHT 223 lb 226 lb 240 lb 12 oz  HEIGHT 5\' 3"  5\' 3"  5\' 3"   BMI 39.5 kg/m2 40.03 kg/m2 42.65 kg/m2  Some encounter information is confidential and restricted. Go to Review Flowsheets activity to see all data.

## 2017-11-29 NOTE — Progress Notes (Signed)
HONEST SAFRANEK     MRN: [id number]      DOB: March 29, 1951   HPI [name] is here for follow up and re-evaluation of chronic medical conditions, medication management and review of any available recent lab and radiology data.  Preventive health is updated, specifically  Cancer screening and Immunization.   Being followed by psychiatry, still has not made her mind up to see the new counselor and no interest in telepsychiatry, thinking f going to [facility] to Dr Jefm Miles. Lo of new stress ans her only living sister recently diagnosed with lung cancer, states sh went into a vegetative state and withdrew for several months , now she has decided that she needs to be strong for her sister. She has a nephew who is there for her , states her  [place] do notspeak to her. She is not suicidal or homicidal, she is conflicted as far as her screening tests and management  The PT denies any adverse reactions to current medications since the last visit.  There are no new concerns.  ROS Denies recent fever or chills. Denies sinus pressure, nasal congestion, ear pain or sore throat. C/o chronic  chest congestion, productive cough and  Wheezing.She is oxygen dependent on 5 liters. Denies chest pains, palpitations and leg swelling Denies abdominal pain, nausea, vomiting,diarrhea or constipation.   Denies dysuria, frequency, hesitancy or incontinence. C/o chronic  joint pain, and limitation in mobility. Denies headaches, seizures, she has lower extremity  numbness, and  tingling. C/o chronic depression, anxiety and  Insomnia.She is treated by Psychiatry , she needs tera py, states she has recently been given 2 additional diagnoses Denies skin break down or rash.   PE  BP 138/84   Pulse (!) 101   Resp 16   Ht 5\' 3"  (1.6 m)   Wt 223 lb (101.2 kg)   SpO2 91% Comment: 5 liters oxygen  BMI 39.50 kg/m   Patient alert and oriented and in no cardiopulmonary distress.  HEENT: No facial asymmetry, EOMI,    oropharynx pink and moist.  Neck supple no JVD, no mass.  Chest: Decreased ROM with bilateral wheezes and bibasilar crackles  CVS: S1, S2 no murmurs, no S3.Regular rate.  ABD: Soft non tender.   Ext: No edema  MS: Decreased  ROM spine, shoulders, hips and knees.  Skin: Intact, no ulcerations or rash noted.  Psych: Good eye contact, normal affect. Memory impaired not anxious or depressed appearing.  CNS: CN 2-12 intact, power,  normal throughout.no focal deficits noted.   Assessment & Plan  Essential hypertension Controlled, no change in medication DASH diet and commitment to daily physical activity for a minimum of 30 minutes discussed and encouraged, as a part of hypertension management. The importance of attaining a healthy weight is also discussed.  BP/Weight 11/24/2017 08/18/2017 02/17/2017 11/03/2016 10/27/2016 10/24/2016 6/64/4034  Systolic BP 742 595 638 756 433 - 295  Diastolic BP 84 78 70 74 71 - 80  Wt. (Lbs) 223 226 240.75 250 - 245 245  BMI 39.5 40.03 42.65 44.29 - 43.4 43.4  Some encounter information is confidential and restricted. Go to Review Flowsheets activity to see all data.       Morbid obesity ([place]) Improved, she is applauded on this Patient re-educated about  the importance of commitment to a  minimum of 150 minutes of exercise per week.  The importance of healthy food choices with portion control discussed. Encouraged to start a food diary, count calories and to  consider  joining a support group. Sample diet sheets offered. Goals set by the patient for the next several months.   Weight /BMI 11/24/2017 08/18/2017 02/17/2017  WEIGHT 223 lb 226 lb 240 lb 12 oz  HEIGHT 5\' 3"  5\' 3"  5\' 3"   BMI 39.5 kg/m2 40.03 kg/m2 42.65 kg/m2  Some encounter information is confidential and restricted. Go to Review Flowsheets activity to see all data.      Hypothyroidism Controled no change in medication dose  NICOTINE ADDICTION Asked: confirms currently smokes 15  cigarettes/ day Assess; unwilling to set quit date, wants to reduce to 7/day in next 3 to 4 months Advise: need to quit; because of strong f/h lung cancer and she also has abn chest scan, currently has lung failure and has oxygen dependency, increased heart disease risk/ risk of MI, stroke, and all types of cancer Assist: literature provided, counseled for 5 mins, and QUIT NOW # provided Arrange f/u in 3 months  Anxiety and depression Inadequately treated depression, will defer to treating physician at this time, offered telepsychiary at the visit , but she refused  Uncontrolled diabetes mellitus with diabetic nephropathy ([place]) [name] is reminded of the importance of commitment to daily physical activity for 30 minutes or more, as able and the need to limit carbohydrate intake to 30 to 60 grams per meal to help with blood sugar control.   The need to take medication as prescribed, test blood sugar as directed, and to call between visits if there is a concern that blood sugar is uncontrolled is also discussed.   [name] is reminded of the importance of daily foot exam, annual eye examination, and good blood sugar, blood pressure and cholesterol control. Deteriorated, medication not changed, pt needs to comply with diet and medication as well as to test her blood sugar  Diabetic Labs Latest Ref Rng & Units 11/09/2017 01/21/2017 10/25/2016 10/24/2016 10/22/2016  HbA1c <5.7 % of total Hgb 8.1(H) 7.8(H) - - 9.4(H)  Microalbumin Not estab mg/dL - - - - -  Micro/Creat Ratio <30 mcg/mg creat - - - - -  Chol <200 mg/dL 133 115 - - -  HDL >50 mg/dL 55 47(L) - - -  Calc LDL mg/dL (calc) 61 53 - - -  Triglycerides <150 mg/dL 83 73 - - -  Creatinine 0.50 - 0.99 mg/dL 0.99 0.94 1.18(H) 1.08(H) 1.04(H)   BP/Weight 11/24/2017 08/18/2017 02/17/2017 11/03/2016 10/27/2016 10/24/2016 6/96/2952  Systolic BP 841 324 401 027 253 - 664  Diastolic BP 84 78 70 74 71 - 80  Wt. (Lbs) 223 226 240.75 250 - 245 245    BMI 39.5 40.03 42.65 44.29 - 43.4 43.4  Some encounter information is confidential and restricted. Go to Review Flowsheets activity to see all data.   Foot/eye exam completion dates Latest Ref Rng & Units 11/24/2017 07/01/2016  Eye Exam No Retinopathy - -  Foot exam Order - - -  Foot Form Completion - Done Done         Screening for depression Positive screen and uncontrolled despite  Being currently treated by Psychiatry,  Would definitely benefit from therapy, will need to work on getting her back into therapy

## 2017-11-29 NOTE — Assessment & Plan Note (Signed)
Inadequately treated depression, will defer to treating physician at this time, offered telepsychiary at the visit , but she refused

## 2017-11-29 NOTE — Assessment & Plan Note (Signed)
[name] is reminded of the importance of commitment to daily physical activity for 30 minutes or more, as able and the need to limit carbohydrate intake to 30 to 60 grams per meal to help with blood sugar control.   The need to take medication as prescribed, test blood sugar as directed, and to call between visits if there is a concern that blood sugar is uncontrolled is also discussed.   [name] is reminded of the importance of daily foot exam, annual eye examination, and good blood sugar, blood pressure and cholesterol control. Deteriorated, medication not changed, pt needs to comply with diet and medication as well as to test her blood sugar  Diabetic Labs Latest Ref Rng & Units 11/09/2017 01/21/2017 10/25/2016 10/24/2016 10/22/2016  HbA1c <5.7 % of total Hgb 8.1(H) 7.8(H) - - 9.4(H)  Microalbumin Not estab mg/dL - - - - -  Micro/Creat Ratio <30 mcg/mg creat - - - - -  Chol <200 mg/dL 133 115 - - -  HDL >50 mg/dL 55 47(L) - - -  Calc LDL mg/dL (calc) 61 53 - - -  Triglycerides <150 mg/dL 83 73 - - -  Creatinine 0.50 - 0.99 mg/dL 0.99 0.94 1.18(H) 1.08(H) 1.04(H)   BP/Weight 11/24/2017 08/18/2017 02/17/2017 11/03/2016 10/27/2016 10/24/2016 4/54/0981  Systolic BP 191 478 295 621 308 - 657  Diastolic BP 84 78 70 74 71 - 80  Wt. (Lbs) 223 226 240.75 250 - 245 245  BMI 39.5 40.03 42.65 44.29 - 43.4 43.4  Some encounter information is confidential and restricted. Go to Review Flowsheets activity to see all data.   Foot/eye exam completion dates Latest Ref Rng & Units 11/24/2017 07/01/2016  Eye Exam No Retinopathy - -  Foot exam Order - - -  Foot Form Completion - Done Done

## 2017-11-29 NOTE — Assessment & Plan Note (Signed)
Asked: confirms currently smokes 15 cigarettes/ day Assess; unwilling to set quit date, wants to reduce to 7/day in next 3 to 4 months Advise: need to quit; because of strong f/h lung cancer and she also has abn chest scan, currently has lung failure and has oxygen dependency, increased heart disease risk/ risk of MI, stroke, and all types of cancer Assist: literature provided, counseled for 5 mins, and QUIT NOW # provided Arrange f/u in 3 months

## 2017-11-30 ENCOUNTER — Other Ambulatory Visit: Payer: Self-pay | Admitting: Family Medicine

## 2017-12-02 ENCOUNTER — Other Ambulatory Visit (HOSPITAL_COMMUNITY): Payer: Self-pay | Admitting: Psychiatry

## 2017-12-02 ENCOUNTER — Telehealth (HOSPITAL_COMMUNITY): Payer: Self-pay | Admitting: *Deleted

## 2017-12-02 MED ORDER — ALPRAZOLAM 1 MG PO TABS: ORAL_TABLET | ORAL | 2 refills | Status: DC

## 2017-12-02 MED ORDER — ZOLPIDEM TARTRATE 10 MG PO TABS: ORAL_TABLET | ORAL | 2 refills | Status: DC

## 2017-12-02 NOTE — Telephone Encounter (Signed)
ambien prescribed 6/10 with 2 refills

## 2017-12-02 NOTE — Telephone Encounter (Signed)
sent 

## 2017-12-02 NOTE — Telephone Encounter (Signed)
Dr Harrington Challenger Patient called stating she needs refill on med's & I informed that  2 refills are on the Xanax & Abilify. Only thing needing a refill is the Ambien. Then she informs me that she is still  having issues with trying to cut/break in half the Abilify to take 0.5 tablets (1 mg total) by mouth daily. She said the Rx told her the tablet is scored to break/cut & she will always have crumbs. She then states that she just would take a whole tablet since she was told that. And I said even then you still have a refill for the   month of July on the Abilify & Xanax. The only one requiring a refill would be the Ambien

## 2017-12-02 NOTE — Telephone Encounter (Signed)
Did verify which med's did she need refill request for. Refills needed for Xanax & Ambien. Rx has only refill for Abilify on file

## 2017-12-02 NOTE — Telephone Encounter (Signed)
Dr Harrington Challenger Patient called LVM @ front office stating that everyone keeps lying to her. Every time she calls the Rx she gets   lies. I called RX to verify which med's did she need refill request for. Refills needed for Xanax & Ambien. She's very anxious because the July 4th holiday is coming & fears she's going to be out of medication

## 2017-12-07 ENCOUNTER — Telehealth (HOSPITAL_COMMUNITY): Payer: Self-pay | Admitting: *Deleted

## 2017-12-07 ENCOUNTER — Other Ambulatory Visit (HOSPITAL_COMMUNITY): Payer: Self-pay | Admitting: Psychiatry

## 2017-12-07 MED ORDER — ARIPIPRAZOLE 2 MG PO TABS: 2.0000 mg | ORAL_TABLET | Freq: Every day | ORAL | 2 refills | Status: DC

## 2017-12-07 NOTE — Telephone Encounter (Signed)
Dr Harrington Challenger Patient called stating that she took last Abilify & Rx will not fill because she's trying to get them to give without the instruction Abilify to take 0.5 tablets (1 mg total) by mouth daily. Patient states & Rx confirmed the tablet isn't to be broken or chewed & since it isn't scored it's impossible for her to break or cut in half to get a 0.5 dosage.  SO the RX is requesting a new script other than the one on file for .Abilify to take 0.5 tablets (1 mg total) by mouth daily  Patient states she has been taking the whole 2 mg & took her last one yesterday.

## 2017-12-07 NOTE — Telephone Encounter (Signed)
Changed to 2 mg daily

## 2017-12-16 ENCOUNTER — Telehealth: Payer: Self-pay | Admitting: Family Medicine

## 2017-12-16 ENCOUNTER — Other Ambulatory Visit: Payer: Self-pay | Admitting: Family Medicine

## 2017-12-16 ENCOUNTER — Telehealth (HOSPITAL_COMMUNITY): Payer: Self-pay | Admitting: *Deleted

## 2017-12-16 NOTE — Telephone Encounter (Signed)
Patient called left a message that her Prozac is unable to be filled at her pharmacy and asking that we call. Call made to [facility] to discuss medication. Was told that Prozac is fine it is her Gabapentin and she was told to call Dr. Moshe Cipro not Dr. Harrington Challenger. Called to notify patient that medication from this office is current and up to date and to call her PCP for refill.

## 2017-12-16 NOTE — Telephone Encounter (Signed)
Patient called in to request a refill for neurontin.  Pharmacy is [facility] on [address] Cb#; [phone]

## 2017-12-16 NOTE — Telephone Encounter (Signed)
Medication refilled

## 2017-12-21 ENCOUNTER — Ambulatory Visit (INDEPENDENT_AMBULATORY_CARE_PROVIDER_SITE_OTHER): Payer: Medicare Other | Admitting: Psychiatry

## 2017-12-21 ENCOUNTER — Encounter (HOSPITAL_COMMUNITY): Payer: Self-pay | Admitting: Psychiatry

## 2017-12-21 VITALS — BP 124/79 | HR 110 | Ht 63.0 in | Wt 240.0 lb

## 2017-12-21 DIAGNOSIS — F333 Major depressive disorder, recurrent, severe with psychotic symptoms: Secondary | ICD-10-CM | POA: Diagnosis not present

## 2017-12-21 DIAGNOSIS — Z811 Family history of alcohol abuse and dependence: Secondary | ICD-10-CM | POA: Diagnosis not present

## 2017-12-21 DIAGNOSIS — Z818 Family history of other mental and behavioral disorders: Secondary | ICD-10-CM

## 2017-12-21 MED ORDER — ZOLPIDEM TARTRATE 10 MG PO TABS: ORAL_TABLET | ORAL | 2 refills | Status: DC

## 2017-12-21 MED ORDER — FLUOXETINE HCL 20 MG PO CAPS: ORAL_CAPSULE | ORAL | 2 refills | Status: DC

## 2017-12-21 MED ORDER — ALPRAZOLAM 1 MG PO TABS: ORAL_TABLET | ORAL | 2 refills | Status: DC

## 2017-12-21 MED ORDER — ARIPIPRAZOLE 2 MG PO TABS: 2.0000 mg | ORAL_TABLET | Freq: Every day | ORAL | 2 refills | Status: DC

## 2017-12-21 NOTE — Progress Notes (Signed)
BH MD/PA/NP OP Progress Note  12/21/2017 10:56 AM [name]  MRN:  [id number]  Chief Complaint:  Chief Complaint    Depression; Anxiety; Follow-up     HPI: this patient is a 67 year old widowed black female who lives with her godson in [place]. She has no children. She is on disability.  The patient was referred by Dr. Moshe Cipro her primary care physician and Dr. Jefm Miles her psychologist for further evaluation and treatment of major depression and anxiety.  The patient states that she's been depressed since her teen years. She's not really sure why. When she was in college she went through severe depression and had to quit. She's had this on and off through the years but really not receive treatment until a couple years ago after her husband died. Her primary care physician has put her on Prozac and Xanax which have helped to some degree.however recently she has been getting more depressed. She has severe COPD and is on oxygen. Yet, she continues to smoke THREE PACKS PER DAY which she claims is secondary to her depressed mood.  Recently she's gone through more deaths including her sister in 2014 a niece in 2014. Another sister was recently diagnosed with cancer. The patient has gotten overwhelmed by all this and claims it's getting hard for her to just get out of bed and function. She has no energy, she's been shaky and having tremors in her hands, she complains of short-term memory loss.her sleep is variable and Ambien sometimes helps. Her appetite is up and down. Sometimes she hears the voices of her deceased relatives or "feels her spirits." She's never been to a psychiatric hospitalization But has been seeing Dr. Jefm Miles in our office for several years. She has never been suicidal and states she is very "spiritual and values life. She stays away from people but she has a god niece and God sister who check on her faithfully.she denies any other psychotic symptoms and does not use  drugs or alcohol   The patient returns after 4 weeks.  She is obviously still on oxygen.  However she states that the Abilify has really helped her mood.  She states that she is more "forward thinking."  She is to think she did have a future and was only focusing on dying.  Now she is much happier.  However she has gained some weight since she started the medication and part of this is because she does not like taking the Lasix.  Overall however her mood is good she is sleeping better she states that her diabetes is under fairly good control and she is smoking less.  Visit Diagnosis:    ICD-10-CM   1. Severe recurrent major depressive disorder with psychotic features ([place]) F33.3     Past Psychiatric History: none  Past Medical History:  Past Medical History:  Diagnosis Date  . Anxiety   . COPD (chronic obstructive pulmonary disease) ([place])   . Depression 2008  . Diabetes mellitus, type 1 1983   type 2 diabetes  . DJD (degenerative joint disease)    of the spine   . GERD (gastroesophageal reflux disease)   . Hyperlipidemia 2007  . Hypertension 1983  . Hypothyroidism 2004   left thyroidectomy  . Insomnia   . Obesity     Past Surgical History:  Procedure Laterality Date  . APPENDECTOMY    . bilatal great toenail removed , complicated by MRSA in rigt toenail  May 2011  . BREAST EXCISIONAL BIOPSY  Right    20+ yrs ago  . TONSILLECTOMY  childhood   . TOTAL ABDOMINAL HYSTERECTOMY W/ BILATERAL SALPINGOOPHORECTOMY    . tracheotomy secondary to failed attempt and lung biopsy      Family Psychiatric History: See below  Family History:  Family History  Problem Relation Age of Onset  . Hypertension Mother   . Stroke Mother   . Cancer Father        lung   . Hypertension Sister   . Depression Sister   . Alcohol abuse Sister   . Hypertension Sister   . Depression Sister   . Cancer Sister   . Diabetes Sister   . Depression Sister   . Cancer Sister        renal   . Kidney  failure Brother   . Kidney failure Unknown        family history   . Hypertension Unknown        family history     Social History:  Social History   Socioeconomic History  . Marital status: Widowed    Spouse name: Not on file  . Number of children: Not on file  . Years of education: Not on file  . Highest education level: Not on file  Occupational History  . Occupation: disable   Social Needs  . Financial resource strain: Not on file  . Food insecurity:    Worry: Not on file    Inability: Not on file  . Transportation needs:    Medical: Not on file    Non-medical: Not on file  Tobacco Use  . Smoking status: Current Every Day Smoker    Packs/day: 0.50    Years: 44.00    Pack years: 22.00    Types: Cigarettes  . Smokeless tobacco: Never Used  . Tobacco comment: current 15/ day, would like to smoke 7/ day  Substance and Sexual Activity  . Alcohol use: No  . Drug use: No  . Sexual activity: Not Currently  Lifestyle  . Physical activity:    Days per week: Not on file    Minutes per session: Not on file  . Stress: Not on file  Relationships  . Social connections:    Talks on phone: Not on file    Gets together: Not on file    Attends religious service: Not on file    Active member of club or organization: Not on file    Attends meetings of clubs or organizations: Not on file    Relationship status: Not on file  Other Topics Concern  . Not on file  Social History Narrative  . Not on file    Allergies:  Allergies  Allergen Reactions  . Codeine     REACTION: nausea    Metabolic Disorder Labs: Lab Results  Component Value Date   HGBA1C 8.1 (H) 11/09/2017   MPG 186 11/09/2017   MPG 177 01/21/2017   No results found for: PROLACTIN Lab Results  Component Value Date   CHOL 133 11/09/2017   TRIG 83 11/09/2017   HDL 55 11/09/2017   CHOLHDL 2.4 11/09/2017   VLDL 15 01/21/2017   LDLCALC 61 11/09/2017   LDLCALC 53 01/21/2017   Lab Results  Component  Value Date   TSH 2.34 11/09/2017   TSH 1.81 01/21/2017    Therapeutic Level Labs: No results found for: LITHIUM No results found for: VALPROATE No components found for:  CBMZ  Current Medications: Current Outpatient Medications  Medication Sig  Dispense Refill  . acetaminophen (TYLENOL) 325 MG tablet Take 650 mg by mouth every 6 (six) hours as needed.    . ALPRAZolam (XANAX) 1 MG tablet TAKE 1 TABLET BY MOUTH FOUR TIMES DAILY 120 tablet 2  . ARIPiprazole (ABILIFY) 2 MG tablet Take 1 tablet (2 mg total) by mouth daily. 30 tablet 2  . aspirin (ASPIRIN LOW DOSE) 81 MG EC tablet Take 81 mg by mouth daily.      . calcium carbonate (TUMS) 500 MG chewable tablet Chew 1 tablet by mouth as needed.      . cloNIDine (CATAPRES) 0.2 MG tablet One tablet at bedtime for blood pressure 90 tablet 3  . clotrimazole-betamethasone (LOTRISONE) cream APPLY TO THE AFFECTED AREA TWICE DAILY 45 g 0  . esomeprazole (NEXIUM) 40 MG capsule TAKE 1 CAPSULE BY MOUTH EVERY MORNING 90 capsule 0  . ferrous sulfate 325 (65 FE) MG tablet Take 325 mg by mouth every other day.     Marland Kitchen FLUoxetine (PROZAC) 20 MG capsule Take two in the am and one in the evening 90 capsule 2  . Fluticasone-Salmeterol (ADVAIR DISKUS) 250-50 MCG/DOSE AEPB inhale 1 dose by mouth twice a day 60 each 4  . folic acid (FOLVITE) 947 MCG tablet Take 400 mcg by mouth daily.    . furosemide (LASIX) 40 MG tablet take 1 tablet by mouth once daily 30 tablet 4  . gabapentin (NEURONTIN) 300 MG capsule TAKE ONE CAPSULE BY MOUTH AT BEDTIME 30 capsule 0  . glipiZIDE (GLUCOTROL XL) 10 MG 24 hr tablet Take 1 tablet (10 mg total) by mouth daily with breakfast. Dose reduction effective 03/19/20219 90 tablet 3  . glipiZIDE (GLUCOTROL XL) 2.5 MG 24 hr tablet Take 1 tablet (2.5 mg total) by mouth daily with breakfast. 90 tablet 3  . Insulin Pen Needle 31G X 5 MM MISC Use once daily to inject insulin dx E11.65 50 each 5  . ipratropium-albuterol (DUONEB) 0.5-2.5 (3) MG/3ML  SOLN Take 3 mLs by nebulization 4 (four) times daily as needed.     . levalbuterol (XOPENEX HFA) 45 MCG/ACT inhaler Inhale 1-2 puffs into the lungs as needed.      Marland Kitchen levothyroxine (SYNTHROID, LEVOTHROID) 100 MCG tablet TAKE 1 TABLET ONCE DAILY 90 tablet 1  . lisinopril (PRINIVIL,ZESTRIL) 5 MG tablet Take 1 tablet (5 mg total) by mouth daily. 90 tablet 3  . montelukast (SINGULAIR) 10 MG tablet TAKE 1 TABLET BY MOUTH AT BEDTIME 90 tablet 0  . rosuvastatin (CRESTOR) 20 MG tablet TAKE 1 TABLET ONCE DAILY 90 tablet 0  . Vitamin D, Ergocalciferol, (DRISDOL) 50000 units CAPS capsule TAKE 1 CAPSULE BY MOUTH ONCE WEEKLY 4 capsule 0  . zolpidem (AMBIEN) 10 MG tablet TAKE 1 TABLET(10 MG) BY MOUTH AT BEDTIME 30 tablet 2   No current facility-administered medications for this visit.      Musculoskeletal: Strength & Muscle Tone: Decreased Gait & Station: unsteady Patient leans: N/A  Psychiatric Specialty Exam: Review of Systems  Respiratory: Positive for shortness of breath.   All other systems reviewed and are negative.   Blood pressure 124/79, pulse (!) 110, height 5\' 3"  (1.6 m), weight 240 lb (108.9 kg), SpO2 (!) 80 %.Body mass index is 42.51 kg/m.  General Appearance: Casual and Fairly Groomed  Eye Contact:  Good  Speech:  Clear and Coherent  Volume:  Normal  Mood:  Euthymic  Affect:  Congruent  Thought Process:  Goal Directed  Orientation:  Full (Time, Place, and Person)  Thought Content: WDL   Suicidal Thoughts:  No  Homicidal Thoughts:  No  Memory:  Immediate;   Good Recent;   Good Remote;   Fair  Judgement:  Poor  Insight:  Lacking  Psychomotor Activity:  Decreased  Concentration:  Concentration: Fair and Attention Span: Fair  Recall:  Good  Fund of Knowledge: Fair  Language: Good  Akathisia:  No  Handed:  Right  AIMS (if indicated): not done  Assets:  Communication Skills Desire for Improvement Resilience Social Support Talents/Skills  ADL's:  Intact  Cognition: WNL   Sleep:  Good   Screenings: Mini-Mental     Office Visit from 12/28/2014 in [facility]  Total Score (max 30 points )  24    PHQ2-9     Office Visit from 11/24/2017 in [facility] Office Visit from 07/01/2016 in [facility] Office Visit from 12/28/2015 in [facility] Office Visit from 11/29/2014 in [facility]  PHQ-2 Total Score  4  0  2  2  PHQ-9 Total Score  17  -  18  7       Assessment and Plan: Patient is a 67 year old female with a history of depression and anxiety.  She is doing better since we added the Abilify 2 mg to her regimen.  The only caveat is that she is really going to watch her calorie intake.  She will continue Ambien 10 mg at bedtime for sleep, Prozac 60 mg daily for depression and Xanax 1 mg 4 times daily for anxiety.  She will return to see me in 3 months   Levonne Spiller, MD 12/21/2017, 10:56 AM

## 2017-12-30 ENCOUNTER — Other Ambulatory Visit: Payer: Self-pay | Admitting: Family Medicine

## 2018-02-10 ENCOUNTER — Telehealth: Payer: Self-pay | Admitting: Family Medicine

## 2018-02-10 ENCOUNTER — Other Ambulatory Visit: Payer: Self-pay | Admitting: Family Medicine

## 2018-02-10 ENCOUNTER — Other Ambulatory Visit: Payer: Self-pay

## 2018-02-10 MED ORDER — ESOMEPRAZOLE MAGNESIUM 40 MG PO CPDR: DELAYED_RELEASE_CAPSULE | ORAL | 1 refills | Status: DC

## 2018-02-10 MED ORDER — LISINOPRIL 5 MG PO TABS: 5.0000 mg | ORAL_TABLET | Freq: Every day | ORAL | 1 refills | Status: AC

## 2018-02-10 MED ORDER — GLIPIZIDE ER 10 MG PO TB24: 10.0000 mg | ORAL_TABLET | Freq: Every day | ORAL | 1 refills | Status: DC

## 2018-02-10 MED ORDER — ROSUVASTATIN CALCIUM 20 MG PO TABS: 20.0000 mg | ORAL_TABLET | Freq: Every day | ORAL | 1 refills | Status: DC

## 2018-02-10 NOTE — Telephone Encounter (Signed)
Patient is requesting refills for 1. gabapentin (NEURONTIN) 300 MG capsule  2. glipiZIDE (GLUCOTROL XL) 10 MG 24 hr tablet   3. rosuvastatin (CRESTOR) 20 MG tablet  4.lisinopril (PRINIVIL,ZESTRIL) 5 MG tablet  5.esomeprazole (NEXIUM) 40 MG capsule   Pharmacy: [facility] on [address] Cb#: [phone]

## 2018-02-10 NOTE — Telephone Encounter (Signed)
meds refilled 

## 2018-02-15 ENCOUNTER — Other Ambulatory Visit (HOSPITAL_COMMUNITY): Payer: Self-pay

## 2018-02-15 ENCOUNTER — Ambulatory Visit: Payer: Self-pay

## 2018-02-18 ENCOUNTER — Inpatient Hospital Stay (HOSPITAL_COMMUNITY)
Admission: EM | Admit: 2018-02-18 | Discharge: 2018-02-20 | DRG: 191 | Disposition: A | Discharge status: Home / Self Care | Payer: Medicare Other | Attending: Internal Medicine | Admitting: Internal Medicine

## 2018-02-18 ENCOUNTER — Other Ambulatory Visit: Payer: Self-pay

## 2018-02-18 ENCOUNTER — Encounter (HOSPITAL_COMMUNITY): Payer: Self-pay | Admitting: Emergency Medicine

## 2018-02-18 ENCOUNTER — Emergency Department (HOSPITAL_COMMUNITY): Payer: Medicare Other

## 2018-02-18 DIAGNOSIS — E1065 Type 1 diabetes mellitus with hyperglycemia: Secondary | ICD-10-CM | POA: Diagnosis not present

## 2018-02-18 DIAGNOSIS — F418 Other specified anxiety disorders: Secondary | ICD-10-CM | POA: Diagnosis present

## 2018-02-18 DIAGNOSIS — Z801 Family history of malignant neoplasm of trachea, bronchus and lung: Secondary | ICD-10-CM

## 2018-02-18 DIAGNOSIS — Z6839 Body mass index (BMI) 39.0-39.9, adult: Secondary | ICD-10-CM

## 2018-02-18 DIAGNOSIS — J9611 Chronic respiratory failure with hypoxia: Secondary | ICD-10-CM | POA: Diagnosis present

## 2018-02-18 DIAGNOSIS — Z823 Family history of stroke: Secondary | ICD-10-CM

## 2018-02-18 DIAGNOSIS — E785 Hyperlipidemia, unspecified: Secondary | ICD-10-CM | POA: Diagnosis present

## 2018-02-18 DIAGNOSIS — J449 Chronic obstructive pulmonary disease, unspecified: Secondary | ICD-10-CM | POA: Diagnosis present

## 2018-02-18 DIAGNOSIS — E1021 Type 1 diabetes mellitus with diabetic nephropathy: Secondary | ICD-10-CM | POA: Diagnosis present

## 2018-02-18 DIAGNOSIS — R0689 Other abnormalities of breathing: Secondary | ICD-10-CM | POA: Diagnosis not present

## 2018-02-18 DIAGNOSIS — M479 Spondylosis, unspecified: Secondary | ICD-10-CM | POA: Diagnosis present

## 2018-02-18 DIAGNOSIS — Z9089 Acquired absence of other organs: Secondary | ICD-10-CM

## 2018-02-18 DIAGNOSIS — K219 Gastro-esophageal reflux disease without esophagitis: Secondary | ICD-10-CM | POA: Diagnosis present

## 2018-02-18 DIAGNOSIS — E1165 Type 2 diabetes mellitus with hyperglycemia: Secondary | ICD-10-CM | POA: Diagnosis not present

## 2018-02-18 DIAGNOSIS — F419 Anxiety disorder, unspecified: Secondary | ICD-10-CM | POA: Diagnosis not present

## 2018-02-18 DIAGNOSIS — J441 Chronic obstructive pulmonary disease with (acute) exacerbation: Secondary | ICD-10-CM | POA: Diagnosis not present

## 2018-02-18 DIAGNOSIS — Z794 Long term (current) use of insulin: Secondary | ICD-10-CM

## 2018-02-18 DIAGNOSIS — S299XXA Unspecified injury of thorax, initial encounter: Secondary | ICD-10-CM | POA: Diagnosis not present

## 2018-02-18 DIAGNOSIS — E876 Hypokalemia: Secondary | ICD-10-CM

## 2018-02-18 DIAGNOSIS — Z8249 Family history of ischemic heart disease and other diseases of the circulatory system: Secondary | ICD-10-CM

## 2018-02-18 DIAGNOSIS — E1121 Type 2 diabetes mellitus with diabetic nephropathy: Secondary | ICD-10-CM | POA: Diagnosis not present

## 2018-02-18 DIAGNOSIS — R0602 Shortness of breath: Secondary | ICD-10-CM | POA: Diagnosis not present

## 2018-02-18 DIAGNOSIS — R918 Other nonspecific abnormal finding of lung field: Secondary | ICD-10-CM

## 2018-02-18 DIAGNOSIS — I10 Essential (primary) hypertension: Secondary | ICD-10-CM | POA: Diagnosis not present

## 2018-02-18 DIAGNOSIS — E890 Postprocedural hypothyroidism: Secondary | ICD-10-CM | POA: Diagnosis present

## 2018-02-18 DIAGNOSIS — R590 Localized enlarged lymph nodes: Secondary | ICD-10-CM | POA: Diagnosis present

## 2018-02-18 DIAGNOSIS — R000 Tachycardia, unspecified: Secondary | ICD-10-CM | POA: Diagnosis not present

## 2018-02-18 DIAGNOSIS — Z515 Encounter for palliative care: Secondary | ICD-10-CM

## 2018-02-18 DIAGNOSIS — IMO0002 Reserved for concepts with insufficient information to code with codable children: Secondary | ICD-10-CM | POA: Diagnosis present

## 2018-02-18 DIAGNOSIS — R062 Wheezing: Secondary | ICD-10-CM | POA: Diagnosis not present

## 2018-02-18 DIAGNOSIS — Z811 Family history of alcohol abuse and dependence: Secondary | ICD-10-CM

## 2018-02-18 DIAGNOSIS — Z90710 Acquired absence of both cervix and uterus: Secondary | ICD-10-CM

## 2018-02-18 DIAGNOSIS — Z7982 Long term (current) use of aspirin: Secondary | ICD-10-CM

## 2018-02-18 DIAGNOSIS — R52 Pain, unspecified: Secondary | ICD-10-CM | POA: Diagnosis not present

## 2018-02-18 DIAGNOSIS — Z7189 Other specified counseling: Secondary | ICD-10-CM

## 2018-02-18 DIAGNOSIS — C3411 Malignant neoplasm of upper lobe, right bronchus or lung: Secondary | ICD-10-CM | POA: Diagnosis not present

## 2018-02-18 DIAGNOSIS — F17200 Nicotine dependence, unspecified, uncomplicated: Secondary | ICD-10-CM | POA: Diagnosis present

## 2018-02-18 DIAGNOSIS — Z66 Do not resuscitate: Secondary | ICD-10-CM | POA: Diagnosis not present

## 2018-02-18 DIAGNOSIS — Z9981 Dependence on supplemental oxygen: Secondary | ICD-10-CM

## 2018-02-18 DIAGNOSIS — F329 Major depressive disorder, single episode, unspecified: Secondary | ICD-10-CM | POA: Diagnosis present

## 2018-02-18 DIAGNOSIS — Z9049 Acquired absence of other specified parts of digestive tract: Secondary | ICD-10-CM

## 2018-02-18 DIAGNOSIS — F32A Depression, unspecified: Secondary | ICD-10-CM | POA: Diagnosis present

## 2018-02-18 DIAGNOSIS — R0902 Hypoxemia: Secondary | ICD-10-CM | POA: Diagnosis not present

## 2018-02-18 DIAGNOSIS — Z885 Allergy status to narcotic agent status: Secondary | ICD-10-CM

## 2018-02-18 DIAGNOSIS — Z79890 Hormone replacement therapy (postmenopausal): Secondary | ICD-10-CM

## 2018-02-18 DIAGNOSIS — Z833 Family history of diabetes mellitus: Secondary | ICD-10-CM

## 2018-02-18 DIAGNOSIS — Z818 Family history of other mental and behavioral disorders: Secondary | ICD-10-CM

## 2018-02-18 DIAGNOSIS — Z841 Family history of disorders of kidney and ureter: Secondary | ICD-10-CM

## 2018-02-18 LAB — HEMOGLOBIN A1C
HEMOGLOBIN A1C: 9.1 % — AB (ref 4.8–5.6)
Mean Plasma Glucose: 214.47 mg/dL

## 2018-02-18 LAB — CBC WITH DIFFERENTIAL/PLATELET
BASOS ABS: 0.1 10*3/uL (ref 0.0–0.1)
BASOS PCT: 1 %
EOS ABS: 0.2 10*3/uL (ref 0.0–0.7)
EOS PCT: 2 %
HCT: 45.9 % (ref 36.0–46.0)
Hemoglobin: 14.6 g/dL (ref 12.0–15.0)
LYMPHS PCT: 7 %
Lymphs Abs: 0.7 10*3/uL (ref 0.7–4.0)
MCH: 30.9 pg (ref 26.0–34.0)
MCHC: 31.8 g/dL (ref 30.0–36.0)
MCV: 97.2 fL (ref 78.0–100.0)
MONO ABS: 0.3 10*3/uL (ref 0.1–1.0)
Monocytes Relative: 3 %
Neutro Abs: 8.7 10*3/uL — ABNORMAL HIGH (ref 1.7–7.7)
Neutrophils Relative %: 87 %
PLATELETS: 244 10*3/uL (ref 150–400)
RBC: 4.72 MIL/uL (ref 3.87–5.11)
RDW: 13.5 % (ref 11.5–15.5)
WBC: 9.9 10*3/uL (ref 4.0–10.5)

## 2018-02-18 LAB — GLUCOSE, CAPILLARY
GLUCOSE-CAPILLARY: 250 mg/dL — AB (ref 70–99)
Glucose-Capillary: 311 mg/dL — ABNORMAL HIGH (ref 70–99)

## 2018-02-18 LAB — COMPREHENSIVE METABOLIC PANEL
ALBUMIN: 3.7 g/dL (ref 3.5–5.0)
ALT: 19 U/L (ref 0–44)
AST: 24 U/L (ref 15–41)
Alkaline Phosphatase: 90 U/L (ref 38–126)
Anion gap: 12 (ref 5–15)
BUN: 12 mg/dL (ref 8–23)
CHLORIDE: 101 mmol/L (ref 98–111)
CO2: 30 mmol/L (ref 22–32)
Calcium: 9 mg/dL (ref 8.9–10.3)
Creatinine, Ser: 1.06 mg/dL — ABNORMAL HIGH (ref 0.44–1.00)
GFR calc Af Amer: >60 mL/min (ref >60)
GFR calc non Af Amer: 54 mL/min — ABNORMAL LOW (ref >60)
GLUCOSE: 248 mg/dL — AB (ref 70–99)
POTASSIUM: 3.1 mmol/L — AB (ref 3.5–5.1)
SODIUM: 143 mmol/L (ref 135–145)
Total Bilirubin: 1 mg/dL (ref 0.3–1.2)
Total Protein: 7.1 g/dL (ref 6.5–8.1)

## 2018-02-18 LAB — BRAIN NATRIURETIC PEPTIDE: B NATRIURETIC PEPTIDE 5: 1721 pg/mL — AB (ref 0.0–100.0)

## 2018-02-18 MED ORDER — ACETAMINOPHEN 325 MG PO TABS
650.0000 mg | ORAL_TABLET | Freq: Once | ORAL | Status: AC
  Administered 2018-02-18: 650 mg via ORAL

## 2018-02-18 MED ORDER — FLUOXETINE HCL 20 MG PO CAPS
20.0000 mg | ORAL_CAPSULE | Freq: Every day | ORAL | Status: DC
  Administered 2018-02-18 – 2018-02-19 (×2): 20 mg via ORAL
  Filled 2018-02-18 (×2): qty 1

## 2018-02-18 MED ORDER — MOMETASONE FURO-FORMOTEROL FUM 200-5 MCG/ACT IN AERO
2.0000 | INHALATION_SPRAY | Freq: Two times a day (BID) | RESPIRATORY_TRACT | Status: DC
  Administered 2018-02-18 – 2018-02-20 (×4): 2 via RESPIRATORY_TRACT
  Filled 2018-02-18 (×2): qty 8.8

## 2018-02-18 MED ORDER — FUROSEMIDE 40 MG PO TABS
40.0000 mg | ORAL_TABLET | Freq: Every day | ORAL | Status: DC
  Administered 2018-02-18 – 2018-02-20 (×3): 40 mg via ORAL
  Filled 2018-02-18 (×3): qty 1

## 2018-02-18 MED ORDER — FLUOXETINE HCL 20 MG PO CAPS
40.0000 mg | ORAL_CAPSULE | Freq: Every day | ORAL | Status: DC
  Administered 2018-02-19 – 2018-02-20 (×2): 40 mg via ORAL
  Filled 2018-02-18 (×2): qty 2

## 2018-02-18 MED ORDER — GABAPENTIN 300 MG PO CAPS
300.0000 mg | ORAL_CAPSULE | Freq: Every day | ORAL | Status: DC
  Administered 2018-02-18 – 2018-02-19 (×2): 300 mg via ORAL
  Filled 2018-02-18 (×2): qty 1

## 2018-02-18 MED ORDER — GLIPIZIDE ER 5 MG PO TB24
10.0000 mg | ORAL_TABLET | Freq: Every day | ORAL | Status: DC
  Administered 2018-02-19 – 2018-02-20 (×2): 10 mg via ORAL
  Filled 2018-02-18 (×2): qty 2

## 2018-02-18 MED ORDER — FERROUS SULFATE 325 (65 FE) MG PO TABS
325.0000 mg | ORAL_TABLET | ORAL | Status: DC
  Filled 2018-02-18 (×2): qty 1

## 2018-02-18 MED ORDER — IPRATROPIUM-ALBUTEROL 0.5-2.5 (3) MG/3ML IN SOLN
3.0000 mL | RESPIRATORY_TRACT | Status: DC | PRN
  Administered 2018-02-19 – 2018-02-20 (×2): 3 mL via RESPIRATORY_TRACT
  Filled 2018-02-18 (×2): qty 3

## 2018-02-18 MED ORDER — ALPRAZOLAM 1 MG PO TABS
1.0000 mg | ORAL_TABLET | Freq: Three times a day (TID) | ORAL | Status: DC | PRN
  Administered 2018-02-18 – 2018-02-20 (×5): 1 mg via ORAL
  Filled 2018-02-18: qty 2
  Filled 2018-02-18 (×4): qty 1

## 2018-02-18 MED ORDER — ZOLPIDEM TARTRATE 5 MG PO TABS
5.0000 mg | ORAL_TABLET | Freq: Every evening | ORAL | Status: DC | PRN
  Administered 2018-02-18: 5 mg via ORAL
  Filled 2018-02-18: qty 1

## 2018-02-18 MED ORDER — IPRATROPIUM-ALBUTEROL 0.5-2.5 (3) MG/3ML IN SOLN
3.0000 mL | Freq: Four times a day (QID) | RESPIRATORY_TRACT | Status: DC | PRN
  Administered 2018-02-18 (×2): 3 mL via RESPIRATORY_TRACT
  Filled 2018-02-18 (×2): qty 3

## 2018-02-18 MED ORDER — ALBUTEROL SULFATE (2.5 MG/3ML) 0.083% IN NEBU
INHALATION_SOLUTION | RESPIRATORY_TRACT | Status: AC
  Filled 2018-02-18: qty 3

## 2018-02-18 MED ORDER — GLIPIZIDE ER 2.5 MG PO TB24
2.5000 mg | ORAL_TABLET | Freq: Every day | ORAL | Status: DC
Start: 2018-02-19 — End: 2018-02-20
  Administered 2018-02-19 – 2018-02-20 (×2): 2.5 mg via ORAL
  Filled 2018-02-18 (×4): qty 1

## 2018-02-18 MED ORDER — METHYLPREDNISOLONE SODIUM SUCC 40 MG IJ SOLR
40.0000 mg | Freq: Two times a day (BID) | INTRAMUSCULAR | Status: AC
  Administered 2018-02-18 – 2018-02-19 (×2): 40 mg via INTRAVENOUS
  Filled 2018-02-18 (×2): qty 1

## 2018-02-18 MED ORDER — PANTOPRAZOLE SODIUM 40 MG PO TBEC
80.0000 mg | DELAYED_RELEASE_TABLET | Freq: Every day | ORAL | Status: DC
  Administered 2018-02-19 – 2018-02-20 (×2): 80 mg via ORAL
  Filled 2018-02-18 (×2): qty 2

## 2018-02-18 MED ORDER — LISINOPRIL 5 MG PO TABS
5.0000 mg | ORAL_TABLET | Freq: Every day | ORAL | Status: DC
  Administered 2018-02-18 – 2018-02-20 (×3): 5 mg via ORAL
  Filled 2018-02-18 (×3): qty 1

## 2018-02-18 MED ORDER — IPRATROPIUM-ALBUTEROL 0.5-2.5 (3) MG/3ML IN SOLN
3.0000 mL | Freq: Four times a day (QID) | RESPIRATORY_TRACT | Status: DC
  Administered 2018-02-19 – 2018-02-20 (×6): 3 mL via RESPIRATORY_TRACT
  Filled 2018-02-18 (×6): qty 3

## 2018-02-18 MED ORDER — PREDNISONE 20 MG PO TABS: 40.0000 mg | ORAL_TABLET | Freq: Every day | ORAL | Status: DC

## 2018-02-18 MED ORDER — IPRATROPIUM-ALBUTEROL 0.5-2.5 (3) MG/3ML IN SOLN
3.0000 mL | Freq: Once | RESPIRATORY_TRACT | Status: AC
  Administered 2018-02-18: 3 mL via RESPIRATORY_TRACT
  Filled 2018-02-18: qty 3

## 2018-02-18 MED ORDER — NICOTINE 14 MG/24HR TD PT24
14.0000 mg | MEDICATED_PATCH | Freq: Every day | TRANSDERMAL | Status: DC
  Filled 2018-02-18 (×2): qty 1

## 2018-02-18 MED ORDER — ARIPIPRAZOLE 2 MG PO TABS
2.0000 mg | ORAL_TABLET | Freq: Every day | ORAL | Status: DC
  Administered 2018-02-18 – 2018-02-20 (×3): 2 mg via ORAL
  Filled 2018-02-18 (×6): qty 1

## 2018-02-18 MED ORDER — FLUOXETINE HCL 20 MG PO CAPS: 20.0000 mg | ORAL_CAPSULE | Freq: Two times a day (BID) | ORAL | Status: DC

## 2018-02-18 MED ORDER — INSULIN ASPART 100 UNIT/ML ~~LOC~~ SOLN
0.0000 [IU] | Freq: Three times a day (TID) | SUBCUTANEOUS | Status: DC
  Administered 2018-02-18: 7 [IU] via SUBCUTANEOUS
  Administered 2018-02-19: 9 [IU] via SUBCUTANEOUS
  Administered 2018-02-19: 5 [IU] via SUBCUTANEOUS

## 2018-02-18 MED ORDER — IOHEXOL 300 MG/ML  SOLN
75.0000 mL | Freq: Once | INTRAMUSCULAR | Status: AC | PRN
  Administered 2018-02-18: 75 mL via INTRAVENOUS

## 2018-02-18 MED ORDER — ACETAMINOPHEN 650 MG RE SUPP: 650.0000 mg | Freq: Four times a day (QID) | RECTAL | Status: DC | PRN

## 2018-02-18 MED ORDER — POTASSIUM CHLORIDE 20 MEQ PO PACK
40.0000 meq | PACK | Freq: Once | ORAL | Status: AC
  Administered 2018-02-18: 40 meq via ORAL
  Filled 2018-02-18: qty 2

## 2018-02-18 MED ORDER — MONTELUKAST SODIUM 10 MG PO TABS
10.0000 mg | ORAL_TABLET | Freq: Every day | ORAL | Status: DC
  Administered 2018-02-18 – 2018-02-19 (×2): 10 mg via ORAL
  Filled 2018-02-18 (×2): qty 1

## 2018-02-18 MED ORDER — DOXYCYCLINE HYCLATE 100 MG PO TABS
100.0000 mg | ORAL_TABLET | Freq: Two times a day (BID) | ORAL | Status: DC
  Administered 2018-02-18 – 2018-02-20 (×4): 100 mg via ORAL
  Filled 2018-02-18 (×4): qty 1

## 2018-02-18 MED ORDER — ALBUTEROL SULFATE (2.5 MG/3ML) 0.083% IN NEBU
2.5000 mg | INHALATION_SOLUTION | Freq: Once | RESPIRATORY_TRACT | Status: AC
  Administered 2018-02-18: 2.5 mg via RESPIRATORY_TRACT

## 2018-02-18 MED ORDER — ASPIRIN EC 81 MG PO TBEC
81.0000 mg | DELAYED_RELEASE_TABLET | Freq: Every day | ORAL | Status: DC
  Administered 2018-02-18 – 2018-02-20 (×3): 81 mg via ORAL
  Filled 2018-02-18 (×3): qty 1

## 2018-02-18 MED ORDER — ACETAMINOPHEN 325 MG PO TABS
ORAL_TABLET | ORAL | Status: AC
  Filled 2018-02-18: qty 2

## 2018-02-18 MED ORDER — LEVOTHYROXINE SODIUM 100 MCG PO TABS
100.0000 ug | ORAL_TABLET | Freq: Every day | ORAL | Status: DC
  Administered 2018-02-19 – 2018-02-20 (×2): 100 ug via ORAL
  Filled 2018-02-18 (×2): qty 1

## 2018-02-18 MED ORDER — CLONIDINE HCL 0.2 MG PO TABS
0.2000 mg | ORAL_TABLET | Freq: Every day | ORAL | Status: DC
  Administered 2018-02-18 – 2018-02-19 (×2): 0.2 mg via ORAL
  Filled 2018-02-18 (×2): qty 1

## 2018-02-18 MED ORDER — ONDANSETRON HCL 4 MG/2ML IJ SOLN: 4.0000 mg | Freq: Four times a day (QID) | INTRAMUSCULAR | Status: DC | PRN

## 2018-02-18 MED ORDER — ROSUVASTATIN CALCIUM 20 MG PO TABS
20.0000 mg | ORAL_TABLET | Freq: Every day | ORAL | Status: DC
  Administered 2018-02-18 – 2018-02-19 (×2): 20 mg via ORAL
  Filled 2018-02-18 (×2): qty 1

## 2018-02-18 MED ORDER — CLOTRIMAZOLE-BETAMETHASONE 1-0.05 % EX CREA
TOPICAL_CREAM | Freq: Two times a day (BID) | CUTANEOUS | Status: DC
  Administered 2018-02-18 – 2018-02-20 (×4): via TOPICAL
  Filled 2018-02-18: qty 15

## 2018-02-18 MED ORDER — SENNOSIDES-DOCUSATE SODIUM 8.6-50 MG PO TABS: 1.0000 | ORAL_TABLET | Freq: Every evening | ORAL | Status: DC | PRN

## 2018-02-18 MED ORDER — ONDANSETRON HCL 4 MG PO TABS: 4.0000 mg | ORAL_TABLET | Freq: Four times a day (QID) | ORAL | Status: DC | PRN

## 2018-02-18 MED ORDER — FOLIC ACID 1 MG PO TABS
1.0000 mg | ORAL_TABLET | Freq: Every day | ORAL | Status: DC
  Administered 2018-02-18 – 2018-02-20 (×3): 1 mg via ORAL
  Filled 2018-02-18 (×3): qty 1

## 2018-02-18 MED ORDER — ACETAMINOPHEN 325 MG PO TABS: 650.0000 mg | ORAL_TABLET | Freq: Four times a day (QID) | ORAL | Status: DC | PRN

## 2018-02-18 MED ORDER — ENOXAPARIN SODIUM 60 MG/0.6ML ~~LOC~~ SOLN
50.0000 mg | SUBCUTANEOUS | Status: DC
  Administered 2018-02-18 – 2018-02-19 (×2): 50 mg via SUBCUTANEOUS
  Filled 2018-02-18 (×2): qty 0.6

## 2018-02-18 NOTE — H&P (Signed)
History and Physical    [name] YCX:[id number] DOB: 1950/08/15 DOA: 02/18/2018  PCP: Fayrene Helper, MD Patient coming from: Home  I have personally briefly reviewed patient's old medical records in [place]  Chief Complaint: SOB  HPI: [name] is a 67 y.o. female with medical history significant of advanced COPD, type 2 diabetes, chronic respiratory failure presents with shortness of breath.  Patient was home and fell.  Her oxygen fell off and she was unable to get up.  EMS was then called.  Per report she was satting around 90% on her baseline 6 L of nasal cannula.  She has had worsening shortness of breath over the past few weeks.  She recently completed a Z-Pak per primary care provider.  She has not been on steroids in a while.  EMS gave her a nebulizer and IV steroids.  She denies any chest pain.  Patient has a known lung mass dating back to last year.  She did not want any treatment.  Today CT of her chest showed 3.1 cm right upper lobe lung mass, greatly enlarged from 2018 and most consistent with bronchogenic carcinoma.2. Worsening right hilar lymphadenopathy including a 4.7 cm conglomerate suprahilar mass  ED Course: CT scan of the chest was obtained.  Patient was given additional nebulizer and Tylenol.  Review of Systems: Denies any anorexia chest discomfort fever positive for chronic cough and chronic lower extremity edema All others reviewed with patient  and are  negative unless otherwise stated As per HPI otherwise 10 point review of systems negative.    Past Medical History:  Diagnosis Date  . Anxiety   . COPD (chronic obstructive pulmonary disease) ([place])   . Depression 2008  . Diabetes mellitus, type 1 1983   type 2 diabetes  . DJD (degenerative joint disease)    of the spine   . GERD (gastroesophageal reflux disease)   . Hyperlipidemia 2007  . Hypertension 1983  . Hypothyroidism 2004   left thyroidectomy  . Insomnia   . Obesity      Past Surgical History:  Procedure Laterality Date  . APPENDECTOMY    . bilatal great toenail removed , complicated by MRSA in rigt toenail  May 2011  . BREAST EXCISIONAL BIOPSY Right    20+ yrs ago  . TONSILLECTOMY  childhood   . TOTAL ABDOMINAL HYSTERECTOMY W/ BILATERAL SALPINGOOPHORECTOMY    . tracheotomy secondary to failed attempt and lung biopsy       reports that she has been smoking cigarettes. She has a 22.00 pack-year smoking history. She has never used smokeless tobacco. She reports that she does not drink alcohol or use drugs.Her nephew lives with her   Allergies  Allergen Reactions  . Codeine     REACTION: nausea    Family History  Problem Relation Age of Onset  . Hypertension Mother   . Stroke Mother   . Cancer Father        lung   . Hypertension Sister   . Depression Sister   . Alcohol abuse Sister   . Hypertension Sister   . Depression Sister   . Cancer Sister   . Diabetes Sister   . Depression Sister   . Cancer Sister        renal   . Kidney failure Brother   . Kidney failure Unknown        family history   . Hypertension Unknown        family  history      Prior to Admission medications   Medication Sig Start Date End Date Taking? Authorizing Provider  acetaminophen (TYLENOL) 325 MG tablet Take 650 mg by mouth every 6 (six) hours as needed.   Yes [provider]  ALPRAZolam Duanne Moron) 1 MG tablet TAKE 1 TABLET BY MOUTH FOUR TIMES DAILY 12/21/17  Yes Cloria Spring, MD  ARIPiprazole (ABILIFY) 2 MG tablet Take 1 tablet (2 mg total) by mouth daily. 12/21/17  Yes Cloria Spring, MD  aspirin (ASPIRIN LOW DOSE) 81 MG EC tablet Take 81 mg by mouth daily.     Yes [provider]  azithromycin (ZITHROMAX) 250 MG tablet Take 1 tablet by mouth daily. 02/12/18 02/18/18 Yes [provider]  calcium carbonate (TUMS) 500 MG chewable tablet Chew 1 tablet by mouth as needed.     Yes [provider]  cloNIDine (CATAPRES) 0.2 MG  tablet One tablet at bedtime for blood pressure 08/18/17  Yes Fayrene Helper, MD  clotrimazole-betamethasone (LOTRISONE) cream APPLY TO THE AFFECTED AREA TWICE DAILY 12/01/17  Yes Fayrene Helper, MD  esomeprazole (NEXIUM) 40 MG capsule TAKE 1 CAPSULE BY MOUTH EVERY MORNING 02/15/18  Yes Fayrene Helper, MD  ferrous sulfate 325 (65 FE) MG tablet Take 325 mg by mouth every other day.    Yes [provider]  FLUoxetine (PROZAC) 20 MG capsule Take two in the am and one in the evening 12/21/17  Yes Cloria Spring, MD  Fluticasone-Salmeterol (ADVAIR DISKUS) 250-50 MCG/DOSE AEPB inhale 1 dose by mouth twice a day 04/09/15  Yes Fayrene Helper, MD  folic acid (FOLVITE) 740 MCG tablet Take 400 mcg by mouth daily.   Yes [provider]  furosemide (LASIX) 40 MG tablet TAKE 1 TABLET BY MOUTH DAILY 12/30/17  Yes Fayrene Helper, MD  gabapentin (NEURONTIN) 300 MG capsule TAKE ONE CAPSULE BY MOUTH AT BEDTIME 02/15/18  Yes Fayrene Helper, MD  glipiZIDE (GLUCOTROL XL) 10 MG 24 hr tablet Take 1 tablet (10 mg total) by mouth daily with breakfast. Dose reduction effective 03/19/20219 02/10/18  Yes Fayrene Helper, MD  glipiZIDE (GLUCOTROL XL) 2.5 MG 24 hr tablet Take 1 tablet (2.5 mg total) by mouth daily with breakfast. 11/24/17  Yes Fayrene Helper, MD  ipratropium-albuterol (DUONEB) 0.5-2.5 (3) MG/3ML SOLN Take 3 mLs by nebulization 4 (four) times daily as needed.    Yes [provider]  levalbuterol (XOPENEX HFA) 45 MCG/ACT inhaler Inhale 1-2 puffs into the lungs as needed.     Yes [provider]  levothyroxine (SYNTHROID, LEVOTHROID) 100 MCG tablet TAKE 1 TABLET ONCE DAILY 10/05/17  Yes Fayrene Helper, MD  lisinopril (PRINIVIL,ZESTRIL) 5 MG tablet Take 1 tablet (5 mg total) by mouth daily. 02/10/18  Yes Fayrene Helper, MD  montelukast (SINGULAIR) 10 MG tablet TAKE 1 TABLET BY MOUTH AT BEDTIME 10/19/17  Yes Fayrene Helper, MD  rosuvastatin  (CRESTOR) 20 MG tablet TAKE 1 TABLET ONCE DAILY 02/15/18  Yes Fayrene Helper, MD  Vitamin D, Ergocalciferol, (DRISDOL) 50000 units CAPS capsule TAKE 1 CAPSULE BY MOUTH ONCE WEEKLY 12/30/17  Yes Fayrene Helper, MD  zolpidem (AMBIEN) 10 MG tablet TAKE 1 TABLET(10 MG) BY MOUTH AT BEDTIME 12/21/17  Yes Cloria Spring, MD  esomeprazole ([place]) 40 MG capsule TAKE 1 CAPSULE BY MOUTH EVERY MORNING Patient not taking: Reported on 02/18/2018 02/10/18   Fayrene Helper, MD  Insulin Pen Needle 31G X 5 MM  MISC Use once daily to inject insulin dx E11.65 08/12/16   Fayrene Helper, MD  predniSONE (DELTASONE) 10 MG tablet Take 1 tablet by mouth daily. 11/27/17   [provider]    Physical Exam: Vitals:   02/18/18 1532 02/18/18 1639 02/18/18 1705 02/18/18 1749  BP: (!) 151/101  (!) 183/105   Pulse: 95  (!) 102   Resp: 17  20   Temp:      TempSrc:      SpO2: 90% 91% 95%   Weight:    101.9 kg  Height:    5\' 3"  (1.6 m)    Constitutional: NAD, calm, comfortable Vitals:   02/18/18 1532 02/18/18 1639 02/18/18 1705 02/18/18 1749  BP: (!) 151/101  (!) 183/105   Pulse: 95  (!) 102   Resp: 17  20   Temp:      TempSrc:      SpO2: 90% 91% 95%   Weight:    101.9 kg  Height:    5\' 3"  (1.6 m)   Eyes: PERRL, lids and conjunctivae normal ENMT: Mucous membranes are moist. Posterior pharynx clear of any exudate or lesions.Normal dentition.  Neck: normal, supple, no masses,  Respiratory: Few scattered wheezes left greater than right Normal respiratory effort. No accessory muscle use.  Cardiovascular: Regular rate and rhythm, no murmurs / rubs / gallops. + Doppler pedal pulses.   Abdomen: no tenderness, no masses palpated. No hepatosplenomegaly. Bowel sounds positive.  Musculoskeletal: no clubbing / cyanosis. No joint deformity upper and lower extremities. Good ROM, no contractures. Normal muscle tone.  Skin: LE chronic venous statis changes   Neurologic: CN 2-12 grossly intact.  Strength  5/5 in all 4.  Psychiatric: Normal judgment and insight. Alert and oriented x 3. Normal mood.    Labs on Admission: I have personally reviewed following labs and imaging studies  CBC: Recent Labs  Lab 02/18/18 0927  WBC 9.9  NEUTROABS 8.7*  HGB 14.6  HCT 45.9  MCV 97.2  PLT 810   Basic Metabolic Panel: Recent Labs  Lab 02/18/18 0927  NA 143  K 3.1*  CL 101  CO2 30  GLUCOSE 248*  BUN 12  CREATININE 1.06*  CALCIUM 9.0   GFR: Estimated Creatinine Clearance: 60.3 mL/min (A) (by C-G formula based on SCr of 1.06 mg/dL (H)). Liver Function Tests: Recent Labs  Lab 02/18/18 0927  AST 24  ALT 19  ALKPHOS 90  BILITOT 1.0  PROT 7.1  ALBUMIN 3.7   No results for input(s): LIPASE, AMYLASE in the last 168 hours. No results for input(s): AMMONIA in the last 168 hours. Coagulation Profile: No results for input(s): INR, PROTIME in the last 168 hours. Cardiac Enzymes: No results for input(s): CKTOTAL, CKMB, CKMBINDEX, TROPONINI in the last 168 hours. BNP (last 3 results) No results for input(s): PROBNP in the last 8760 hours. HbA1C: No results for input(s): HGBA1C in the last 72 hours. CBG: No results for input(s): GLUCAP in the last 168 hours. Lipid Profile: No results for input(s): CHOL, HDL, LDLCALC, TRIG, CHOLHDL, LDLDIRECT in the last 72 hours. Thyroid Function Tests: No results for input(s): TSH, T4TOTAL, FREET4, T3FREE, THYROIDAB in the last 72 hours. Anemia Panel: No results for input(s): VITAMINB12, FOLATE, FERRITIN, TIBC, IRON, RETICCTPCT in the last 72 hours. Urine analysis:    Component Value Date/Time   COLORURINE STRAW (A) 07/18/2016 1151   APPEARANCEUR CLEAR 07/18/2016 1151   LABSPEC 1.011 07/18/2016 1151   PHURINE 5.0 07/18/2016 1151  GLUCOSEU NEGATIVE 07/18/2016 [place] 07/18/2016 [place] 07/18/2016 1151   KETONESUR NEGATIVE 07/18/2016 1151   PROTEINUR NEGATIVE 07/18/2016 1151   NITRITE NEGATIVE 07/18/2016 1151    LEUKOCYTESUR NEGATIVE 07/18/2016 1151    Radiological Exams on Admission: Ct Chest W Contrast  Result Date: 02/18/2018 CLINICAL DATA:  Chronic shortness of breath, worse in the past 1.5 weeks. EXAM: CT CHEST WITH CONTRAST TECHNIQUE: Multidetector CT imaging of the chest was performed during intravenous contrast administration. CONTRAST:  15mL OMNIPAQUE IOHEXOL 300 MG/ML  SOLN COMPARISON:  12/18/2016 FINDINGS: Cardiovascular: Thoracic aortic atherosclerosis without aneurysm. No central pulmonary embolus on this nondedicated study. Unchanged enlargement of the main pulmonary artery, 3.5 cm diameter. Left main and three-vessel coronary artery atherosclerosis. Right-sided cardiac chamber enlargement. Small pericardial effusion. Mediastinum/Nodes: Mediastinal lymphadenopathy has not significantly changed with paratracheal lymph nodes measuring up to 1.6 cm and a subcarinal lymph node measuring 2.0 cm. Worsening right hilar lymphadenopathy is present including a conglomerate 4.7 x 3.6 cm suprahilar mass. Grossly unremarkable thyroid. Small sliding hiatal hernia. Lungs/Pleura: There is no pleural effusion or pneumothorax. A 3.1 x 3.0 cm mass is present in the posterior right upper lobe contacting the pleura and reflecting enlargement of the 1.4 cm nodule previously seen in this location. There is underlying centrilobular emphysema. Fibrotic interstitial lung changes are similar to the prior CT and are greatest in the right upper lobe with associated traction bronchiectasis. Mild atelectasis is noted in the right greater than left lung bases. Upper Abdomen: Partially visualized diffuse body wall edema. Musculoskeletal: No suspicious osseous lesion. IMPRESSION: 1. 3.1 cm right upper lobe lung mass, greatly enlarged from 2018 and most consistent with bronchogenic carcinoma. 2. Worsening right hilar lymphadenopathy including a 4.7 cm conglomerate suprahilar mass. 3. Unchanged mediastinal lymphadenopathy. 4. Unchanged  pulmonary artery enlargement suggesting pulmonary arterial hypertension. 5. Similar appearance of pulmonary interstitial fibrosis. Aortic Atherosclerosis (ICD10-I70.0) and Emphysema (ICD10-J43.9). Electronically Signed   By: Logan Bores M.D.   On: 02/18/2018 15:13   Dg Chest Port 1 View  Result Date: 02/18/2018 CLINICAL DATA:  Shortness of breath after fall. EXAM: PORTABLE CHEST 1 VIEW COMPARISON:  10/24/2016 FINDINGS: Cardiomegaly. Vascular congestion and diffuse interstitial prominence, likely edema. More confluent airspace opacities in the right upper lobe and left perihilar region. No visible effusions or pneumothorax. No acute bony abnormality. IMPRESSION: Cardiomegaly.  Suspect mild interstitial edema. More confluent opacities in the right upper lobe and left perihilar region could reflect asymmetric edema or infection. Electronically Signed   By: Rolm Baptise M.D.   On: 02/18/2018 09:33    EKG: Independently reviewed. At rate 113 freq PAC/PVC ns st changes   Assessment/Plan Principal Problem:   COPD exacerbation (HCC) Active Problems:   Lung mass- suspected Lung Cancer   Hypokalemia   Morbid obesity (HCC)   Anxiety and depression   Essential hypertension   Uncontrolled diabetes mellitus with diabetic nephropathy (HCC)   Chronic respiratory failure with hypoxia (HCC)   DNR no code (do not resuscitate)     -Bronchodilators, steroids, empiric antibiotics, continue Advair -Cont  supplemental oxygen  achieve O2 sats greater than 90% -Palliative care consult -Carb consistent diet, sliding scale insulin , check hemoglobin A1c, continue Glucotrol -Continue home medications for anxiety depression, hypothyroidism ,hypertension -Replace hypokalemia p.o.   DVT prophylaxis: Lovenox  Code Status: DNR  Disposition Plan: Charge home 1 to 2 days Admission status: Observation medical   Jetaime Pinnix Johnson-Pitts MD [facility] Pager [phone]  If  7PM-7AM, please contact  night-coverage [email or web address] Password [facility]  02/18/2018, 6:58 PM

## 2018-02-18 NOTE — ED Triage Notes (Signed)
Per EMS, pt c/o SOB, & fall with bilateral leg pain. SOB began after pt fell. Pt completed z-pack yesterday for congestion r/t COPD. Pt wears 6L oxygen at home all the time. Pt given 7.5 mg albuterol, 0.5 atrovent, and 125 mg solu medrol PTA. AOX4.

## 2018-02-18 NOTE — ED Provider Notes (Signed)
[facility] EMERGENCY DEPARTMENT Provider Note   CSN: [id number] Arrival date & time: 02/18/18  0747     History   Chief Complaint Chief Complaint  Patient presents with  . Shortness of Breath    HPI [name] is a 67 y.o. female.  Patient brought in by EMS.  Patient normally on 6 to 8 L of nasal cannula oxygen at home due to bad COPD.  Patient fell her oxygen came off.  She called EMS for assistance because she was too weak to get back up.  Patient's been feeling short of breath for the past several days.  Been worse of late.  Patient's been on a Zithromax Z-PAK per her primary care doctor.  She is also followed by Dr. Luan Pulling from pulmonary medicine for COPD.  Patient also known to have a right upper lung mass that was identified a couple years ago patient is opted not to have that addressed further.  She is aware this probably cancer.  Patient is a DNR.  Patient by EMS was given Solu-Medrol.  Patient has not been on steroids recently.  He was given albuterol treatment.  Upon arrival patient's oxygen saturations on 6L was in the low 90s.  This was at rest.  Patient given albuterol Atrovent treatment and as stated patient already been given Solu-Medrol.       Past Medical History:  Diagnosis Date  . Anxiety   . COPD (chronic obstructive pulmonary disease) ([place])   . Depression 2008  . Diabetes mellitus, type 1 1983   type 2 diabetes  . DJD (degenerative joint disease)    of the spine   . GERD (gastroesophageal reflux disease)   . Hyperlipidemia 2007  . Hypertension 1983  . Hypothyroidism 2004   left thyroidectomy  . Insomnia   . Obesity     Patient Active Problem List   Diagnosis Date Noted  . COPD exacerbation ([place]) 02/18/2018  . Screening for depression 11/29/2017  . Non compliance with medical treatment 08/30/2017  . Abnormal CT scan, chest 11/03/2016  . Abnormal brain scan 08/10/2016  . COPD with acute exacerbation ([place]) 07/18/2016  . Weakness  generalized 07/18/2016  . At high risk for injury related to fall 05/29/2016  . Unsteady gait 05/29/2016  . Anemia, deficiency 01/01/2016  . Back spasm 10/01/2015  . DNR no code (do not resuscitate) 06/18/2015  . Excessive daytime sleepiness 12/30/2014  . Abnormal mammogram 12/30/2014  . Chronic respiratory failure with hypoxia ([place]) 12/15/2013  . Hearing loss [id number]  . Oxygen dependent 11/19/2012  . Carotid bruit 04/12/2012  . Uncontrolled diabetes mellitus with diabetic nephropathy ([place]) 08/04/2011  . FATIGUE 02/20/2009  . NICOTINE ADDICTION 12/31/2008  . ALLERGIC RHINITIS CAUSE UNSPECIFIED 10/07/2008  . Essential hypertension 03/07/2008  . UNSPECIFIED PERIPHERAL VASCULAR DISEASE 03/06/2008  . Hypothyroidism 09/08/2007  . Hyperlipidemia LDL goal <100 09/08/2007  . Morbid obesity ([place]) 09/08/2007  . Anxiety and depression 09/08/2007  . COPD, severe ([place]) 09/08/2007  . GERD 09/08/2007  . Osteoarthritis of spine 09/08/2007  . Insomnia 09/08/2007    Past Surgical History:  Procedure Laterality Date  . APPENDECTOMY    . bilatal great toenail removed , complicated by MRSA in rigt toenail  May 2011  . BREAST EXCISIONAL BIOPSY Right    20+ yrs ago  . TONSILLECTOMY  childhood   . TOTAL ABDOMINAL HYSTERECTOMY W/ BILATERAL SALPINGOOPHORECTOMY    . tracheotomy secondary to failed attempt and lung biopsy  OB History   None      Home Medications    Prior to Admission medications   Medication Sig Start Date End Date Taking? Authorizing Provider  acetaminophen (TYLENOL) 325 MG tablet Take 650 mg by mouth every 6 (six) hours as needed.   Yes [provider]  ALPRAZolam Duanne Moron) 1 MG tablet TAKE 1 TABLET BY MOUTH FOUR TIMES DAILY 12/21/17  Yes Cloria Spring, MD  ARIPiprazole (ABILIFY) 2 MG tablet Take 1 tablet (2 mg total) by mouth daily. 12/21/17  Yes Cloria Spring, MD  aspirin (ASPIRIN LOW DOSE) 81 MG EC tablet Take 81 mg by mouth daily.     Yes [provider]  azithromycin (ZITHROMAX) 250 MG tablet Take 1 tablet by mouth daily. 02/12/18 02/18/18 Yes [provider]  calcium carbonate (TUMS) 500 MG chewable tablet Chew 1 tablet by mouth as needed.     Yes [provider]  cloNIDine (CATAPRES) 0.2 MG tablet One tablet at bedtime for blood pressure 08/18/17  Yes Fayrene Helper, MD  clotrimazole-betamethasone (LOTRISONE) cream APPLY TO THE AFFECTED AREA TWICE DAILY 12/01/17  Yes Fayrene Helper, MD  esomeprazole (NEXIUM) 40 MG capsule TAKE 1 CAPSULE BY MOUTH EVERY MORNING 02/15/18  Yes Fayrene Helper, MD  ferrous sulfate 325 (65 FE) MG tablet Take 325 mg by mouth every other day.    Yes [provider]  FLUoxetine (PROZAC) 20 MG capsule Take two in the am and one in the evening 12/21/17  Yes Cloria Spring, MD  Fluticasone-Salmeterol (ADVAIR DISKUS) 250-50 MCG/DOSE AEPB inhale 1 dose by mouth twice a day 04/09/15  Yes Fayrene Helper, MD  folic acid (FOLVITE) 824 MCG tablet Take 400 mcg by mouth daily.   Yes [provider]  furosemide (LASIX) 40 MG tablet TAKE 1 TABLET BY MOUTH DAILY 12/30/17  Yes Fayrene Helper, MD  gabapentin (NEURONTIN) 300 MG capsule TAKE ONE CAPSULE BY MOUTH AT BEDTIME 02/15/18  Yes Fayrene Helper, MD  glipiZIDE (GLUCOTROL XL) 10 MG 24 hr tablet Take 1 tablet (10 mg total) by mouth daily with breakfast. Dose reduction effective 03/19/20219 02/10/18  Yes Fayrene Helper, MD  glipiZIDE (GLUCOTROL XL) 2.5 MG 24 hr tablet Take 1 tablet (2.5 mg total) by mouth daily with breakfast. 11/24/17  Yes Fayrene Helper, MD  ipratropium-albuterol (DUONEB) 0.5-2.5 (3) MG/3ML SOLN Take 3 mLs by nebulization 4 (four) times daily as needed.    Yes [provider]  levalbuterol (XOPENEX HFA) 45 MCG/ACT inhaler Inhale 1-2 puffs into the lungs as needed.     Yes [provider]  levothyroxine (SYNTHROID, LEVOTHROID) 100 MCG tablet TAKE 1 TABLET ONCE DAILY 10/05/17   Yes Fayrene Helper, MD  lisinopril (PRINIVIL,ZESTRIL) 5 MG tablet Take 1 tablet (5 mg total) by mouth daily. 02/10/18  Yes Fayrene Helper, MD  montelukast (SINGULAIR) 10 MG tablet TAKE 1 TABLET BY MOUTH AT BEDTIME 10/19/17  Yes Fayrene Helper, MD  rosuvastatin (CRESTOR) 20 MG tablet TAKE 1 TABLET ONCE DAILY 02/15/18  Yes Fayrene Helper, MD  Vitamin D, Ergocalciferol, (DRISDOL) 50000 units CAPS capsule TAKE 1 CAPSULE BY MOUTH ONCE WEEKLY 12/30/17  Yes Fayrene Helper, MD  zolpidem (AMBIEN) 10 MG tablet TAKE 1 TABLET(10 MG) BY MOUTH AT BEDTIME 12/21/17  Yes Cloria Spring, MD  esomeprazole ([place]) 40 MG capsule TAKE 1 CAPSULE BY MOUTH EVERY MORNING Patient not taking: Reported on 02/18/2018 02/10/18   Fayrene Helper,  MD  Insulin Pen Needle 31G X 5 MM MISC Use once daily to inject insulin dx E11.65 08/12/16   Fayrene Helper, MD  predniSONE (DELTASONE) 10 MG tablet Take 1 tablet by mouth daily. 11/27/17   [provider]    Family History Family History  Problem Relation Age of Onset  . Hypertension Mother   . Stroke Mother   . Cancer Father        lung   . Hypertension Sister   . Depression Sister   . Alcohol abuse Sister   . Hypertension Sister   . Depression Sister   . Cancer Sister   . Diabetes Sister   . Depression Sister   . Cancer Sister        renal   . Kidney failure Brother   . Kidney failure Unknown        family history   . Hypertension Unknown        family history     Social History Social History   Tobacco Use  . Smoking status: Current Every Day Smoker    Packs/day: 0.50    Years: 44.00    Pack years: 22.00    Types: Cigarettes  . Smokeless tobacco: Never Used  . Tobacco comment: current 15/ day, would like to smoke 7/ day  Substance Use Topics  . Alcohol use: No  . Drug use: No     Allergies   Codeine   Review of Systems Review of Systems  Constitutional: Positive for fatigue. Negative for fever.  HENT:  Negative for congestion.   Eyes: Negative for redness.  Respiratory: Positive for cough and shortness of breath.   Cardiovascular: Negative for chest pain.  Gastrointestinal: Negative for abdominal pain, nausea and vomiting.  Genitourinary: Negative for dysuria.  Musculoskeletal: Negative for neck pain.  Skin: Negative for rash.  Neurological: Positive for weakness. Negative for syncope and headaches.  Hematological: Does not bruise/bleed easily.  Psychiatric/Behavioral: Negative for confusion.     Physical Exam Updated Vital Signs BP (!) 151/101   Pulse 95   Temp 98 F (36.7 C) (Oral)   Resp 17   Ht 1.6 m (5\' 3" )   Wt 108 kg   SpO2 90%   BMI 42.18 kg/m   Physical Exam  Constitutional: She is oriented to person, place, and time. She appears well-developed and well-nourished.  HENT:  Mouth/Throat: Oropharynx is clear and moist.  Eyes: Pupils are equal, round, and reactive to light. Conjunctivae and EOM are normal.  Neck: Neck supple.  Cardiovascular: Normal rate and regular rhythm.  Pulmonary/Chest: Effort normal. She has no wheezes.  Decreased breath sounds bilaterally  Abdominal: Soft. Bowel sounds are normal. There is no tenderness.  Musculoskeletal: Normal range of motion.  Neurological: She is alert and oriented to person, place, and time. No cranial nerve deficit. She exhibits normal muscle tone. Coordination normal.  Skin: Skin is warm.  Nursing note and vitals reviewed.    ED Treatments / Results  Labs (all labs ordered are listed, but only abnormal results are displayed) Labs Reviewed  CBC WITH DIFFERENTIAL/PLATELET - Abnormal; Notable for the following components:      Result Value   Neutro Abs 8.7 (*)    All other components within normal limits  COMPREHENSIVE METABOLIC PANEL - Abnormal; Notable for the following components:   Potassium 3.1 (*)    Glucose, Bld 248 (*)    Creatinine, Ser 1.06 (*)    GFR calc non Af Amer 54 (*)  All other components  within normal limits  BRAIN NATRIURETIC PEPTIDE - Abnormal; Notable for the following components:   B Natriuretic Peptide 1,721.0 (*)    All other components within normal limits    EKG EKG Interpretation  Date/Time:  Thursday February 18 2018 08:52:31 EDT Ventricular Rate:  113 PR Interval:    QRS Duration: 103 QT Interval:  317 QTC Calculation: 395 R Axis:   150 Text Interpretation:  Sinus tachycardia Multiform ventricular premature complexes Low voltage, precordial leads Probable right ventricular hypertrophy Minimal ST elevation, inferior leads Confirmed by Fredia Sorrow [phone]) on 02/18/2018 9:05:48 AM   Radiology Ct Chest W Contrast  Result Date: 02/18/2018 CLINICAL DATA:  Chronic shortness of breath, worse in the past 1.5 weeks. EXAM: CT CHEST WITH CONTRAST TECHNIQUE: Multidetector CT imaging of the chest was performed during intravenous contrast administration. CONTRAST:  42mL OMNIPAQUE IOHEXOL 300 MG/ML  SOLN COMPARISON:  12/18/2016 FINDINGS: Cardiovascular: Thoracic aortic atherosclerosis without aneurysm. No central pulmonary embolus on this nondedicated study. Unchanged enlargement of the main pulmonary artery, 3.5 cm diameter. Left main and three-vessel coronary artery atherosclerosis. Right-sided cardiac chamber enlargement. Small pericardial effusion. Mediastinum/Nodes: Mediastinal lymphadenopathy has not significantly changed with paratracheal lymph nodes measuring up to 1.6 cm and a subcarinal lymph node measuring 2.0 cm. Worsening right hilar lymphadenopathy is present including a conglomerate 4.7 x 3.6 cm suprahilar mass. Grossly unremarkable thyroid. Small sliding hiatal hernia. Lungs/Pleura: There is no pleural effusion or pneumothorax. A 3.1 x 3.0 cm mass is present in the posterior right upper lobe contacting the pleura and reflecting enlargement of the 1.4 cm nodule previously seen in this location. There is underlying centrilobular emphysema. Fibrotic interstitial  lung changes are similar to the prior CT and are greatest in the right upper lobe with associated traction bronchiectasis. Mild atelectasis is noted in the right greater than left lung bases. Upper Abdomen: Partially visualized diffuse body wall edema. Musculoskeletal: No suspicious osseous lesion. IMPRESSION: 1. 3.1 cm right upper lobe lung mass, greatly enlarged from 2018 and most consistent with bronchogenic carcinoma. 2. Worsening right hilar lymphadenopathy including a 4.7 cm conglomerate suprahilar mass. 3. Unchanged mediastinal lymphadenopathy. 4. Unchanged pulmonary artery enlargement suggesting pulmonary arterial hypertension. 5. Similar appearance of pulmonary interstitial fibrosis. Aortic Atherosclerosis (ICD10-I70.0) and Emphysema (ICD10-J43.9). Electronically Signed   By: Logan Bores M.D.   On: 02/18/2018 15:13   Dg Chest Port 1 View  Result Date: 02/18/2018 CLINICAL DATA:  Shortness of breath after fall. EXAM: PORTABLE CHEST 1 VIEW COMPARISON:  10/24/2016 FINDINGS: Cardiomegaly. Vascular congestion and diffuse interstitial prominence, likely edema. More confluent airspace opacities in the right upper lobe and left perihilar region. No visible effusions or pneumothorax. No acute bony abnormality. IMPRESSION: Cardiomegaly.  Suspect mild interstitial edema. More confluent opacities in the right upper lobe and left perihilar region could reflect asymmetric edema or infection. Electronically Signed   By: Rolm Baptise M.D.   On: 02/18/2018 09:33    Procedures Procedures (including critical care time)  Medications Ordered in ED Medications  acetaminophen (TYLENOL) 325 MG tablet (has no administration in time range)  ipratropium-albuterol (DUONEB) 0.5-2.5 (3) MG/3ML nebulizer solution 3 mL (3 mLs Nebulization Given 02/18/18 1024)  albuterol (PROVENTIL) (2.5 MG/3ML) 0.083% nebulizer solution 2.5 mg (2.5 mg Nebulization Given 02/18/18 1030)  iohexol (OMNIPAQUE) 300 MG/ML solution 75 mL (75 mLs  Intravenous Contrast Given 02/18/18 1442)  acetaminophen (TYLENOL) tablet 650 mg (650 mg Oral Given 02/18/18 1459)     Initial Impression / Assessment and Plan /  ED Course  I have reviewed the triage vital signs and the nursing notes.  Pertinent labs & imaging results that were available during my care of the patient were reviewed by me and considered in my medical decision making (see chart for details).    Patient with any significant exertion gets shortness of breath.  Chest x-ray raise concerns for pneumonia on the right side.  CT scan with contrast was done which showed no pneumonia no pulmonary edema no evidence of pulmonary embolus.  However did show an enlarging mass in the right upper lobe.  Patient was aware of this.  She is aware that this is probably cancer.  She does not want this addressed.  Patient is a DNR but she does want help with her shortness of breath.  This is probably that component would be an exacerbation of her COPD and should improve with steroids.  Patient given Solu-Medrol here.  Discussed with hospitalist they will admit.   Final Clinical Impressions(s) / ED Diagnoses   Final diagnoses:  COPD exacerbation ([place])  Shortness of breath  Mass of upper lobe of right lung    ED Discharge Orders    None       Fredia Sorrow, MD 02/18/18 1640

## 2018-02-19 ENCOUNTER — Encounter (HOSPITAL_COMMUNITY): Payer: Self-pay | Admitting: Primary Care

## 2018-02-19 DIAGNOSIS — Z811 Family history of alcohol abuse and dependence: Secondary | ICD-10-CM | POA: Diagnosis not present

## 2018-02-19 DIAGNOSIS — E876 Hypokalemia: Secondary | ICD-10-CM

## 2018-02-19 DIAGNOSIS — Z7189 Other specified counseling: Secondary | ICD-10-CM | POA: Diagnosis not present

## 2018-02-19 DIAGNOSIS — F329 Major depressive disorder, single episode, unspecified: Secondary | ICD-10-CM | POA: Diagnosis not present

## 2018-02-19 DIAGNOSIS — Z515 Encounter for palliative care: Secondary | ICD-10-CM

## 2018-02-19 DIAGNOSIS — Z794 Long term (current) use of insulin: Secondary | ICD-10-CM | POA: Diagnosis not present

## 2018-02-19 DIAGNOSIS — J9611 Chronic respiratory failure with hypoxia: Secondary | ICD-10-CM | POA: Diagnosis not present

## 2018-02-19 DIAGNOSIS — E1165 Type 2 diabetes mellitus with hyperglycemia: Secondary | ICD-10-CM | POA: Diagnosis not present

## 2018-02-19 DIAGNOSIS — J441 Chronic obstructive pulmonary disease with (acute) exacerbation: Principal | ICD-10-CM

## 2018-02-19 DIAGNOSIS — R0602 Shortness of breath: Secondary | ICD-10-CM | POA: Diagnosis not present

## 2018-02-19 DIAGNOSIS — F418 Other specified anxiety disorders: Secondary | ICD-10-CM | POA: Diagnosis present

## 2018-02-19 DIAGNOSIS — Z818 Family history of other mental and behavioral disorders: Secondary | ICD-10-CM | POA: Diagnosis not present

## 2018-02-19 DIAGNOSIS — F17200 Nicotine dependence, unspecified, uncomplicated: Secondary | ICD-10-CM | POA: Diagnosis present

## 2018-02-19 DIAGNOSIS — Z79890 Hormone replacement therapy (postmenopausal): Secondary | ICD-10-CM | POA: Diagnosis not present

## 2018-02-19 DIAGNOSIS — I10 Essential (primary) hypertension: Secondary | ICD-10-CM | POA: Diagnosis present

## 2018-02-19 DIAGNOSIS — E1121 Type 2 diabetes mellitus with diabetic nephropathy: Secondary | ICD-10-CM | POA: Diagnosis not present

## 2018-02-19 DIAGNOSIS — R918 Other nonspecific abnormal finding of lung field: Secondary | ICD-10-CM | POA: Diagnosis not present

## 2018-02-19 DIAGNOSIS — K219 Gastro-esophageal reflux disease without esophagitis: Secondary | ICD-10-CM | POA: Diagnosis present

## 2018-02-19 DIAGNOSIS — F419 Anxiety disorder, unspecified: Secondary | ICD-10-CM | POA: Diagnosis not present

## 2018-02-19 DIAGNOSIS — Z833 Family history of diabetes mellitus: Secondary | ICD-10-CM | POA: Diagnosis not present

## 2018-02-19 DIAGNOSIS — E785 Hyperlipidemia, unspecified: Secondary | ICD-10-CM | POA: Diagnosis present

## 2018-02-19 DIAGNOSIS — E1065 Type 1 diabetes mellitus with hyperglycemia: Secondary | ICD-10-CM | POA: Diagnosis present

## 2018-02-19 DIAGNOSIS — E890 Postprocedural hypothyroidism: Secondary | ICD-10-CM | POA: Diagnosis present

## 2018-02-19 DIAGNOSIS — J449 Chronic obstructive pulmonary disease, unspecified: Secondary | ICD-10-CM | POA: Diagnosis present

## 2018-02-19 DIAGNOSIS — Z823 Family history of stroke: Secondary | ICD-10-CM | POA: Diagnosis not present

## 2018-02-19 DIAGNOSIS — Z66 Do not resuscitate: Secondary | ICD-10-CM | POA: Diagnosis present

## 2018-02-19 DIAGNOSIS — E1021 Type 1 diabetes mellitus with diabetic nephropathy: Secondary | ICD-10-CM | POA: Diagnosis present

## 2018-02-19 DIAGNOSIS — Z801 Family history of malignant neoplasm of trachea, bronchus and lung: Secondary | ICD-10-CM | POA: Diagnosis not present

## 2018-02-19 DIAGNOSIS — Z8249 Family history of ischemic heart disease and other diseases of the circulatory system: Secondary | ICD-10-CM | POA: Diagnosis not present

## 2018-02-19 DIAGNOSIS — Z6839 Body mass index (BMI) 39.0-39.9, adult: Secondary | ICD-10-CM | POA: Diagnosis not present

## 2018-02-19 DIAGNOSIS — C3411 Malignant neoplasm of upper lobe, right bronchus or lung: Secondary | ICD-10-CM | POA: Diagnosis present

## 2018-02-19 LAB — GLUCOSE, CAPILLARY
GLUCOSE-CAPILLARY: 286 mg/dL — AB (ref 70–99)
GLUCOSE-CAPILLARY: 117 mg/dL — AB (ref 70–99)
GLUCOSE-CAPILLARY: 235 mg/dL — AB (ref 70–99)
Glucose-Capillary: 370 mg/dL — ABNORMAL HIGH (ref 70–99)

## 2018-02-19 LAB — HIV ANTIBODY (ROUTINE TESTING W REFLEX): HIV SCREEN 4TH GENERATION: NONREACTIVE

## 2018-02-19 MED ORDER — METHYLPREDNISOLONE SODIUM SUCC 40 MG IJ SOLR
40.0000 mg | Freq: Two times a day (BID) | INTRAMUSCULAR | Status: DC
  Administered 2018-02-19 – 2018-02-20 (×2): 40 mg via INTRAVENOUS
  Filled 2018-02-19 (×2): qty 1

## 2018-02-19 MED ORDER — INSULIN ASPART 100 UNIT/ML ~~LOC~~ SOLN
0.0000 [IU] | Freq: Every day | SUBCUTANEOUS | Status: DC
  Administered 2018-02-19: 2 [IU] via SUBCUTANEOUS

## 2018-02-19 MED ORDER — INSULIN ASPART 100 UNIT/ML ~~LOC~~ SOLN
0.0000 [IU] | Freq: Three times a day (TID) | SUBCUTANEOUS | Status: DC
  Administered 2018-02-20: 25 [IU] via SUBCUTANEOUS
  Administered 2018-02-20: 15 [IU] via SUBCUTANEOUS

## 2018-02-19 MED ORDER — GLUCERNA SHAKE PO LIQD
237.0000 mL | Freq: Three times a day (TID) | ORAL | Status: DC
  Administered 2018-02-19 – 2018-02-20 (×3): 237 mL via ORAL

## 2018-02-19 NOTE — Progress Notes (Signed)
PROGRESS NOTE    [name]  IWL:[id number] DOB: [birth year] DOA: 02/18/2018 PCP: Fayrene Helper, MD    Brief Narrative:  67 year old female with a history of chronic respiratory failure related to COPD, admitted to the hospital with worsening shortness of breath and found to have COPD exacerbation.  Started on intravenous steroids, bronchodilators and antibiotics.   Assessment & Plan:   Principal Problem:   COPD exacerbation ([place]) Active Problems:   Morbid obesity ([place])   Anxiety and depression   Essential hypertension   Uncontrolled diabetes mellitus with diabetic nephropathy ([facility])   Chronic respiratory failure with hypoxia ([place])   DNR no code (do not resuscitate)   Hypokalemia   Lung mass- suspected Lung Cancer   COPD (chronic obstructive pulmonary disease) ([facility])   Mass of upper lobe of right lung   Goals of care, counseling/discussion   Palliative care by specialist   Encounter for hospice care discussion   1. COPD exacerbation.  Continues to have wheezing and shortness of breath.  Not quite back to baseline.  Continue on intravenous steroids antibiotics and bronchodilators. 2. Chronic respiratory failure.  She is chronically on supplemental oxygen.  Continue to wean back down to 6-8 L which is her home requirement. 3. Right upper lobe lung mass.  Plans are for outpatient radiation.  May consider hospice after that. 4. Diabetes.  Uncontrolled in the setting of steroids.  Continue sliding scale insulin and glipizide. 5. Hypokalemia.  Replace 6. Hypothyroidism.  Continue on Synthroid 7. Hypertension.  Stable.  Continue on lisinopril. 8. Hyperlipidemia.  Continue statin   DVT prophylaxis: Lovenox Code Status: DNR Family Communication: No family present Disposition Plan: Discharge home once breathing has improved and wheezing has resolved.   Consultants:     Procedures:     Antimicrobials:   Doxycycline 9/19 >   Subjective: Still feels short of  breath not back to baseline.  Continues to have wheezing.  Objective: Vitals:   02/19/18 0527 02/19/18 0723 02/19/18 1147 02/19/18 1419  BP: (!) 156/100   (!) 146/87  Pulse: 92   87  Resp: (!) 24   20  Temp: (!) 97.5 F (36.4 C)   98.3 F (36.8 C)  TempSrc: Oral     SpO2: 95% 97% (!) 78% 93%  Weight:      Height:        Intake/Output Summary (Last 24 hours) at 02/19/2018 1659 Last data filed at 02/19/2018 0540 Gross per 24 hour  Intake 480 ml  Output -  Net 480 ml   Filed Weights   02/18/18 0752 02/18/18 1749  Weight: 108 kg 101.9 kg    Examination:  General exam: Appears calm and comfortable  Respiratory system: Bilateral wheezing. Respiratory effort normal. Cardiovascular system: S1 & S2 heard, RRR. No JVD, murmurs, rubs, gallops or clicks. No pedal edema. Gastrointestinal system: Abdomen is nondistended, soft and nontender. No organomegaly or masses felt. Normal bowel sounds heard. Central nervous system: Alert and oriented. No focal neurological deficits. Extremities: Symmetric 5 x 5 power. Skin: No rashes, lesions or ulcers Psychiatry: Judgement and insight appear normal. Mood & affect appropriate.     Data Reviewed: I have personally reviewed following labs and imaging studies  CBC: Recent Labs  Lab 02/18/18 0927  WBC 9.9  NEUTROABS 8.7*  HGB 14.6  HCT 45.9  MCV 97.2  PLT 174   Basic Metabolic Panel: Recent Labs  Lab 02/18/18 0927  NA 143  K 3.1*  CL 101  CO2  30  GLUCOSE 248*  BUN 12  CREATININE 1.06*  CALCIUM 9.0   GFR: Estimated Creatinine Clearance: 60.3 mL/min (A) (by C-G formula based on SCr of 1.06 mg/dL (H)). Liver Function Tests: Recent Labs  Lab 02/18/18 0927  AST 24  ALT 19  ALKPHOS 90  BILITOT 1.0  PROT 7.1  ALBUMIN 3.7   No results for input(s): LIPASE, AMYLASE in the last 168 hours. No results for input(s): AMMONIA in the last 168 hours. Coagulation Profile: No results for input(s): INR, PROTIME in the last 168  hours. Cardiac Enzymes: No results for input(s): CKTOTAL, CKMB, CKMBINDEX, TROPONINI in the last 168 hours. BNP (last 3 results) No results for input(s): PROBNP in the last 8760 hours. HbA1C: Recent Labs    02/18/18 0927  HGBA1C 9.1*   CBG: Recent Labs  Lab 02/18/18 1747 02/18/18 2119 02/19/18 0727 02/19/18 1155 02/19/18 1633  GLUCAP 311* 250* 286* 370* 117*   Lipid Profile: No results for input(s): CHOL, HDL, LDLCALC, TRIG, CHOLHDL, LDLDIRECT in the last 72 hours. Thyroid Function Tests: No results for input(s): TSH, T4TOTAL, FREET4, T3FREE, THYROIDAB in the last 72 hours. Anemia Panel: No results for input(s): VITAMINB12, FOLATE, FERRITIN, TIBC, IRON, RETICCTPCT in the last 72 hours. Sepsis Labs: No results for input(s): PROCALCITON, LATICACIDVEN in the last 168 hours.  No results found for this or any previous visit (from the past 240 hour(s)).       Radiology Studies: Ct Chest W Contrast  Result Date: 02/18/2018 CLINICAL DATA:  Chronic shortness of breath, worse in the past 1.5 weeks. EXAM: CT CHEST WITH CONTRAST TECHNIQUE: Multidetector CT imaging of the chest was performed during intravenous contrast administration. CONTRAST:  98mL OMNIPAQUE IOHEXOL 300 MG/ML  SOLN COMPARISON:  12/18/2016 FINDINGS: Cardiovascular: Thoracic aortic atherosclerosis without aneurysm. No central pulmonary embolus on this nondedicated study. Unchanged enlargement of the main pulmonary artery, 3.5 cm diameter. Left main and three-vessel coronary artery atherosclerosis. Right-sided cardiac chamber enlargement. Small pericardial effusion. Mediastinum/Nodes: Mediastinal lymphadenopathy has not significantly changed with paratracheal lymph nodes measuring up to 1.6 cm and a subcarinal lymph node measuring 2.0 cm. Worsening right hilar lymphadenopathy is present including a conglomerate 4.7 x 3.6 cm suprahilar mass. Grossly unremarkable thyroid. Small sliding hiatal hernia. Lungs/Pleura: There is no  pleural effusion or pneumothorax. A 3.1 x 3.0 cm mass is present in the posterior right upper lobe contacting the pleura and reflecting enlargement of the 1.4 cm nodule previously seen in this location. There is underlying centrilobular emphysema. Fibrotic interstitial lung changes are similar to the prior CT and are greatest in the right upper lobe with associated traction bronchiectasis. Mild atelectasis is noted in the right greater than left lung bases. Upper Abdomen: Partially visualized diffuse body wall edema. Musculoskeletal: No suspicious osseous lesion. IMPRESSION: 1. 3.1 cm right upper lobe lung mass, greatly enlarged from 2018 and most consistent with bronchogenic carcinoma. 2. Worsening right hilar lymphadenopathy including a 4.7 cm conglomerate suprahilar mass. 3. Unchanged mediastinal lymphadenopathy. 4. Unchanged pulmonary artery enlargement suggesting pulmonary arterial hypertension. 5. Similar appearance of pulmonary interstitial fibrosis. Aortic Atherosclerosis (ICD10-I70.0) and Emphysema (ICD10-J43.9). Electronically Signed   By: Logan Bores M.D.   On: 02/18/2018 15:13   Dg Chest Port 1 View  Result Date: 02/18/2018 CLINICAL DATA:  Shortness of breath after fall. EXAM: PORTABLE CHEST 1 VIEW COMPARISON:  10/24/2016 FINDINGS: Cardiomegaly. Vascular congestion and diffuse interstitial prominence, likely edema. More confluent airspace opacities in the right upper lobe and left perihilar region. No visible effusions  or pneumothorax. No acute bony abnormality. IMPRESSION: Cardiomegaly.  Suspect mild interstitial edema. More confluent opacities in the right upper lobe and left perihilar region could reflect asymmetric edema or infection. Electronically Signed   By: Rolm Baptise M.D.   On: 02/18/2018 09:33        Scheduled Meds: . ARIPiprazole  2 mg Oral Daily  . aspirin EC  81 mg Oral Daily  . cloNIDine  0.2 mg Oral QHS  . clotrimazole-betamethasone   Topical BID  . doxycycline  100 mg  Oral Q12H  . enoxaparin (LOVENOX) injection  50 mg Subcutaneous Q24H  . feeding supplement (GLUCERNA SHAKE)  237 mL Oral TID BM  . ferrous sulfate  325 mg Oral QODAY  . FLUoxetine  40 mg Oral Daily   And  . FLUoxetine  20 mg Oral QHS  . folic acid  1 mg Oral Daily  . furosemide  40 mg Oral Daily  . gabapentin  300 mg Oral QHS  . glipiZIDE  10 mg Oral Q breakfast  . glipiZIDE  2.5 mg Oral Q breakfast  . insulin aspart  0-20 Units Subcutaneous TID WC  . insulin aspart  0-5 Units Subcutaneous QHS  . ipratropium-albuterol  3 mL Nebulization QID  . levothyroxine  100 mcg Oral QAC breakfast  . lisinopril  5 mg Oral Daily  . mometasone-formoterol  2 puff Inhalation BID  . montelukast  10 mg Oral QHS  . nicotine  14 mg Transdermal Daily  . pantoprazole  80 mg Oral Q1200  . [START ON 02/20/2018] predniSONE  40 mg Oral Q breakfast  . rosuvastatin  20 mg Oral q1800   Continuous Infusions:   LOS: 0 days    Time spent: 76mins    Kathie Dike, MD [facility] Pager [phone]  If 7PM-7AM, please contact night-coverage [email or web address] Password [email or web address] 02/19/2018, 4:59 PM

## 2018-02-19 NOTE — Evaluation (Signed)
Occupational Therapy Evaluation Patient Details Name: [name] MRN: [id number] DOB: 12-13-1950 Today's Date: 02/19/2018    History of Present Illness  [name] is a 67 y.o. female with medical history significant of advanced COPD, type 2 diabetes, chronic respiratory failure presents with shortness of breath.  Patient was home and fell.  Her oxygen fell off and she was unable to get up.  EMS was then called.  Per report she was satting around 90% on her baseline 6 L of nasal cannula.  She has had worsening shortness of breath over the past few weeks.  She recently completed a Z-Pak per primary care provider.  She has not been on steroids in a while.  EMS gave her a nebulizer and IV steroids.  She denies any chest pain.  Patient has a known lung mass dating back to last year.  She did not want any treatment.  Today CT of her chest showed 3.1 cm right upper lobe lung mass, greatly enlarged from 2018 and most consistent with bronchogenic carcinoma.2. Worsening right hilar lymphadenopathy including a 4.7 cm conglomerate suprahilar mass   Clinical Impression   Patient sitting on EOB upon therapy arrival and agreeable to participate in OT evaluation. Patient is able to complete bathing and dressing tasks at Mod I level with several rest breaks due to SOB. Pt reports that she has not left her home in 1.5 months due to her SOB and decreased energy and activity tolerance. Patient would benefit from home health OT services to focus on energy conservation and task modification in order to complete the daily tasks required and increase her activity tolerance.     Follow Up Recommendations  Home health OT    Equipment Recommendations  None recommended by OT       Precautions / Restrictions Precautions Precautions: Fall Precaution Comments: history of recent fall Restrictions Weight Bearing Restrictions: No      Mobility Bed Mobility Overal bed mobility: Modified Independent                 Transfers Overall transfer level: Modified independent Equipment used: Straight cane                      ADL either performed or assessed with clinical judgement   ADL Overall ADL's : Modified independent;At baseline               Vision Baseline Vision/History: Wears glasses Wears Glasses: At all times Patient Visual Report: No change from baseline              Pertinent Vitals/Pain Pain Assessment: No/denies pain     Hand Dominance Right   Extremity/Trunk Assessment Upper Extremity Assessment Upper Extremity Assessment: Overall WFL for tasks assessed   Lower Extremity Assessment Lower Extremity Assessment: Defer to PT evaluation       Communication Communication Communication: No difficulties   Cognition Arousal/Alertness: Awake/alert Behavior During Therapy: WFL for tasks assessed/performed Overall Cognitive Status: Within Functional Limits for tasks assessed                Home Living Family/patient expects to be discharged to:: Private residence Living Arrangements: Other relatives(Lives with nephew) Available Help at Discharge: Family;Available 24 hours/day Type of Home: Apartment Home Access: Level entry     Home Layout: One level     Bathroom Shower/Tub: Teacher, early years/pre: Handicapped height     Home Equipment: Environmental consultant - 4 wheels;Cane - quad;Cane -  single point;Shower seat;Tub bench;Bedside commode;Grab bars - tub/shower;Grab bars - toilet   Additional Comments: Nephew completes meal prep and housekeeping tasks. Pt reports that she has been getting progressively weak over the past two weeks. Has been afraid to drive. She hasn't been out of the house in a month and a half because of her weakness.       Prior Functioning/Environment Level of Independence: Independent with assistive device(s)                 OT Problem List: Decreased activity tolerance      OT Treatment/Interventions:       OT Goals(Current goals can be found in the care plan section) Acute Rehab OT Goals Patient Stated Goal: to go home  OT Frequency:             Co-evaluation PT/OT/SLP Co-Evaluation/Treatment: Yes Reason for Co-Treatment: To address functional/ADL transfers   OT goals addressed during session: ADL's and self-care;Proper use of Adaptive equipment and DME;Strengthening/ROM      AM-PAC PT "6 Clicks" Daily Activity     Outcome Measure Help from another person eating meals?: None Help from another person taking care of personal grooming?: None Help from another person toileting, which includes using toliet, bedpan, or urinal?: None Help from another person bathing (including washing, rinsing, drying)?: None Help from another person to put on and taking off regular upper body clothing?: None Help from another person to put on and taking off regular lower body clothing?: None 6 Click Score: 24   End of Session Equipment Utilized During Treatment: Oxygen;Other (comment)(straight cane)  Activity Tolerance: Patient tolerated treatment well;Patient limited by fatigue;Other (comment)(limited by SOB) Patient left: in chair;with call bell/phone within reach;Other (comment)(With physical therapist)  OT Visit Diagnosis: Other (comment);Muscle weakness (generalized) (M62.81)(decreased activity tolerance)                Time: 9611-6435 OT Time Calculation (min): 20 min Charges:  OT General Charges $OT Visit: 1 Visit OT Evaluation $OT Eval Low Complexity: [place], OTR/L,CBIS  [phone]   Arabel Barcenas, Clarene Duke 02/19/2018, 9:16 AM

## 2018-02-19 NOTE — Plan of Care (Signed)
  Problem: Acute Rehab PT Goals(only PT should resolve) Goal: Pt Will Ambulate Outcome: Progressing Flowsheets (Taken 02/19/2018 1051) Pt will Ambulate: 50 feet; with modified independence; with cane Goal: Pt/caregiver will Perform Home Exercise Program Outcome: Progressing Flowsheets (Taken 02/19/2018 1051) Pt/caregiver will Perform Home Exercise Program: For increased strengthening; For improved balance; Independently   10:52 AM, 02/19/18 Lonell Grandchild, MPT Physical Therapist with [facility] [phone] office [phone] mobile phone

## 2018-02-19 NOTE — Progress Notes (Signed)
Initial Nutrition Assessment  DOCUMENTATION CODES:  Obesity unspecified  INTERVENTION:  Glucerna Shake po TID, each supplement provides 220 kcal and 10 grams of protein  Food requests placed in meal system and communicated to diet ambassador  Reviewed importance of protein  NUTRITION DIAGNOSIS:  Increased nutrient needs related to cancer and chronic illness(Endstage COPD) as evidenced by estimated nutritional requirements for these conditions  GOAL:  Patient will meet greater than or equal to 90% of their needs  MONITOR:  Supplement acceptance, PO intake, Labs, Weight trends, I & O's  REASON FOR ASSESSMENT:  Consult COPD Protocol  ASSESSMENT:  67 y/o female PMHx Advanced COPD (6L at baseline),  Chronic resp failure, DM2. Lung mass dating back to last year, declined work up. On admission, CT shows greatly enlarged lung mass as well as worsening hilar lymphadenopathy. Admitted for COPD exacerbation.   Pt seen up in chair about to eat cold cereal.   She reports she has not been eating well for past 2 weeks. When asked why she hasnt been eating well, She says "I have mental issues that come on randomly, sometimes- anorexia and other time bulimia. They just happened to hit in past two weeks".  She also notes she has had a poor appetite and extreme weakness which she attributes to COPD "everything is caused by COPD". She says she is too weak to get up. She says for the past 3-5 days, 2 days all she ate was 1 banana. Another day all she had was PB crackers. She says when she is at her best, she eats probably better than RD does. She says she last was at her best 3 weeks ago.   Weight wise, she says her UBW is 250-257 lbs. Per chart she has been losing weight. She was 240.8 lbs at this time last year. She is now 224.6 lbs. This is a loss of 6.7% bw x 1 year. Per objective information,  It does not appear she has ever maintained a UBW range close to one she reports.   At this time, she  reports having a good appetite. She is accepting of oral supplements. RD also took breakfast requests. She also requests mashed potatoes at lunch/dinner. She says she eats mostly potatoes/vegetables for meals. She can do with/without meats. Briefly reviewed importance that she gets protein from some source.   Labs: A1C: 9.1. BG: 250-310, K:3.1, BNP: >1700 Meds: PO abx, Iron, Folate, lasix, glipizded, insulin,  Ppi,   Recent Labs  Lab 02/18/18 0927  NA 143  K 3.1*  CL 101  CO2 30  BUN 12  CREATININE 1.06*  CALCIUM 9.0  GLUCOSE 248*   NUTRITION - FOCUSED PHYSICAL EXAM: WDL  Diet Order:   Diet Order            Diet heart healthy/carb modified Room service appropriate? Yes; Fluid consistency: Thin  Diet effective now             EDUCATION NEEDS:  Education needs have been addressed  Skin:  Skin Assessment: Reviewed RN Assessment  Last BM:  9/18  Height:  Ht Readings from Last 1 Encounters:  02/18/18 5\' 3"  (1.6 m)   Weight:  Wt Readings from Last 1 Encounters:  02/18/18 101.9 kg   Wt Readings from Last 10 Encounters:  02/18/18 101.9 kg  12/21/17 108.9 kg  11/24/17 101.2 kg  11/09/17 102.1 kg  08/18/17 102.5 kg  07/29/17 105.2 kg  03/31/17 105.7 kg  02/17/17 109.2 kg  12/19/16 110.3  kg  11/03/16 113.4 kg   Ideal Body Weight:  52.27 kg  BMI:  Body mass index is 39.79 kg/m.  Estimated Nutritional Needs:  Kcal:  1650-1850 (16-18 kcal/kg bw) Protein:  75-90g Pro (1.4-1.7 g/kg ibw) Fluid:  1.7-1.9 L fluid (1 ml/kcal)  Burtis Junes RD, LDN, CNSC Clinical Nutrition Available Tues-Sat via Pager: [phone] 02/19/2018 9:52 AM;lf

## 2018-02-19 NOTE — Evaluation (Signed)
Physical Therapy Evaluation Patient Details Name: [name] MRN: [id number] DOB: 01/08/1951 Today's Date: 02/19/2018   History of Present Illness   [name] is a 67 y.o. female with medical history significant of advanced COPD, type 2 diabetes, chronic respiratory failure presents with shortness of breath.  Patient was home and fell.  Her oxygen fell off and she was unable to get up.  EMS was then called.  Per report she was satting around 90% on her baseline 6 L of nasal cannula.  She has had worsening shortness of breath over the past few weeks.  She recently completed a Z-Pak per primary care provider.  She has not been on steroids in a while.  EMS gave her a nebulizer and IV steroids.  She denies any chest pain.  Patient has a known lung mass dating back to last year.  She did not want any treatment.  Today CT of her chest showed 3.1 cm right upper lobe lung mass, greatly enlarged from 2018 and most consistent with bronchogenic carcinoma.2. Worsening right hilar lymphadenopathy including a 4.7 cm conglomerate suprahilar mass    Clinical Impression  Patient demonstrates good return for getting int/out of bed, transferring to chair and commode using SPC without loss of balance, slow labored cadence during gait training while pushing O2 tank and using SPC together without loss of balance, but required frequent standing rest breaks due to SOB.  Patient tolerated sitting up in chair after therapy.  Patient will benefit from continued physical therapy in hospital and recommended venue below to increase strength, balance, endurance for safe ADLs and gait.    Follow Up Recommendations Home health PT;Supervision - Intermittent    Equipment Recommendations  None recommended by PT    Recommendations for Other Services       Precautions / Restrictions Precautions Precautions: Fall Precaution Comments: history of recent fall Restrictions Weight Bearing Restrictions: No       Mobility  Bed Mobility Overal bed mobility: Modified Independent                Transfers Overall transfer level: Modified independent Equipment used: Straight cane                Ambulation/Gait Ambulation/Gait assistance: Supervision Gait Distance (Feet): 40 Feet Assistive device: Straight cane(pushing O2 tank ) Gait Pattern/deviations: Decreased step length - right;Decreased step length - left;Decreased stride length Gait velocity: slow   General Gait Details: demonstrates slow  labored cadence with frequent stand rest breaks due to fatigue and mild SOB while pushing O2 tank while on 6 LPM O2 and using SPC together  Stairs            Wheelchair Mobility    Modified Rankin (Stroke Patients Only)       Balance Overall balance assessment: Needs assistance Sitting-balance support: Feet supported;No upper extremity supported Sitting balance-Leahy Scale: Good     Standing balance support: Single extremity supported;During functional activity Standing balance-Leahy Scale: Fair Standing balance comment: using SPC, using SPC + pushing O2 tank                             Pertinent Vitals/Pain Pain Assessment: No/denies pain    Home Living Family/patient expects to be discharged to:: Private residence Living Arrangements: Other relatives(nephew) Available Help at Discharge: Family;Available PRN/intermittently Type of Home: Apartment Home Access: Level entry     Home Layout: One level Home Equipment: Walker - 4  wheels;Cane - quad;Cane - single point;Shower seat;Tub bench;Bedside commode;Grab bars - tub/shower;Grab bars - toilet Additional Comments: Nephew completes meal prep and housekeeping tasks. Pt reports that she has been getting progressively weak over the past two weeks. Has been afraid to drive. She hasn't been out of the house in a month and a half because of her weakness.     Prior Function Level of Independence: Independent with  assistive device(s)         Comments: for last 3 months household ambulator using SPC, prior to this was ambulating in community pushing O2 tank and using SPC, driving     Hand Dominance   Dominant Hand: Right    Extremity/Trunk Assessment   Upper Extremity Assessment Upper Extremity Assessment: Defer to OT evaluation    Lower Extremity Assessment Lower Extremity Assessment: Generalized weakness    Cervical / Trunk Assessment Cervical / Trunk Assessment: Normal  Communication   Communication: No difficulties  Cognition Arousal/Alertness: Awake/alert Behavior During Therapy: WFL for tasks assessed/performed Overall Cognitive Status: Within Functional Limits for tasks assessed                                        General Comments      Exercises     Assessment/Plan    PT Assessment Patient needs continued PT services  PT Problem List Decreased strength;Decreased activity tolerance;Decreased balance;Decreased mobility       PT Treatment Interventions Gait training;Functional mobility training;Therapeutic activities;Therapeutic exercise;Patient/family education;Stair training    PT Goals (Current goals can be found in the Care Plan section)  Acute Rehab PT Goals Patient Stated Goal: to go home PT Goal Formulation: With patient Time For Goal Achievement: 02/26/18 Potential to Achieve Goals: Good    Frequency Min 3X/week   Barriers to discharge        Co-evaluation   Reason for Co-Treatment: To address functional/ADL transfers   OT goals addressed during session: ADL's and self-care;Proper use of Adaptive equipment and DME;Strengthening/ROM       AM-PAC PT "6 Clicks" Daily Activity  Outcome Measure Difficulty turning over in bed (including adjusting bedclothes, sheets and blankets)?: None Difficulty moving from lying on back to sitting on the side of the bed? : None Difficulty sitting down on and standing up from a chair with arms  (e.g., wheelchair, bedside commode, etc,.)?: None Help needed moving to and from a bed to chair (including a wheelchair)?: None Help needed walking in hospital room?: A Little Help needed climbing 3-5 steps with a railing? : A Little 6 Click Score: 22    End of Session   Activity Tolerance: Patient tolerated treatment well;Patient limited by fatigue Patient left: in chair;with call bell/phone within reach Nurse Communication: Mobility status PT Visit Diagnosis: Unsteadiness on feet (R26.81);Other abnormalities of gait and mobility (R26.89);Muscle weakness (generalized) (M62.81)    Time: 4580-9983 PT Time Calculation (min) (ACUTE ONLY): 34 min   Charges:   PT Evaluation $PT Eval Moderate Complexity: 1 Mod PT Treatments $Therapeutic Activity: 23-37 mins        10:49 AM, 02/19/18 Lonell Grandchild, MPT Physical Therapist with [facility] [phone] office [phone] mobile phone

## 2018-02-19 NOTE — Progress Notes (Signed)
Inpatient Diabetes Program Recommendations  AACE/ADA: New Consensus Statement on Inpatient Glycemic Control (2015)  Target Ranges:  Prepandial:   less than 140 mg/dL      Peak postprandial:   less than 180 mg/dL (1-2 hours)      Critically ill patients:  140 - 180 mg/dL   Lab Results  Component Value Date   GLUCAP 286 (H) 02/19/2018   HGBA1C 9.1 (H) 02/18/2018    Review of Glycemic ControlResults for HADASA, GASNER (MRN [id number]) as of 02/19/2018 11:38  Ref. Range 02/18/2018 17:47 02/18/2018 21:19 02/19/2018 07:27  Glucose-Capillary Latest Ref Range: 70 - 99 mg/dL 311 (H) 250 (H) 286 (H)    Diabetes history: Type 2 DM  Outpatient Diabetes medications:  Glipizide XL 10 mg daily, Prednisone 10 mg daily Current orders for Inpatient glycemic control:  Novolog sensitive tid with meals  Prednisone 40 mg daily Inpatient Diabetes Program Recommendations:    If appropriate, may consider adding Lantus 20 units daily while in the hospital.   Thanks  Adah Perl, RN, BC-ADM Inpatient Diabetes Coordinator Pager [phone] (8a-5p)

## 2018-02-19 NOTE — Care Management Note (Signed)
Case Management Note  Patient Details  Name: [name] MRN: [id number] Date of Birth: 02-12-1951  Subjective/Objective:        Admitted with COPD exacerbation. Pt is from home, lives with her nephew and has strong family support. She uses a cane with ambulation, does not feel she needs a RW. She has home O2 and is at baseline on 5lpm but has recently had an increase in O2 requirement to 8lpm. She has the ability to go up to 10L in her home.  She talks about her dx of cancer and her concerns with pain management because she has been "red flagged" in the past for using her sisters medicines when hers would not manage her pain. She is currently not in pain and currently does not use opioids for pain management. Dr. Luan Pulling has told her he will refer to oncology for palliative radiation when pain does become a problem. We discuss hospice services and the benefits you receive with hospice, she has had a sister die in the past year with hospice services. She is interested in services once she completes any radiation she may need. She is okay with [facility] at [place]. She has used [facility] in the past and would like to use them again.            Action/Plan: DC home with HH through [facility]. Vaughan Basta, [facility] rep, given referral. Pt aware HH has 48 hrs to make first visit.   Expected Discharge Date:     02/19/18             Expected Discharge Plan:  [place]  In-House Referral:  Hospice / Palliative Care  Discharge planning Services  CM Consult  Post Acute Care Choice:  Home Health Choice offered to:  Patient  HH Arranged:  RN, PT, OT [facility] Agency:  [place]  Status of Service:  Completed, signed off   Sherald Barge, RN 02/19/2018, 12:40 PM

## 2018-02-19 NOTE — Consult Note (Signed)
Consultation Note Date: 02/19/2018   Patient Name: [name]  DOB: August 10, 1950  MRN: [id number]  Age / Sex: 67 y.o., female  PCP: Fayrene Helper, MD Referring Physician: Kathie Dike, MD  Reason for Consultation: Establishing goals of care  HPI/Patient Profile: 67 y.o. female  with past medical history of anxiety and depression, type 1 diabetes, GERD, high blood pressure and cholesterol, hypothyroidism, obesity, current daily smoker, COPD admitted on 02/18/2018 with COPD exacerbation, lung mass that is suspicious for cancer.   Clinical Assessment and Goals of Care: [name] is sitting up in the [place] chair in her room.  She greets me smiling, making and keeping eye contact.  She is pleasant, calm and cooperative.  We talked about her discharge plan.  She is knowledgeable about discharge, telling me that she anticipates discharge tomorrow. We talked about palliative medicine versus hospice services.  [name] tells me that quality is more important than quantity for her.  She shares a story of her eldest sister who also had lung cancer who died at age 21.  This was 2023/01/08 of this year.  She shares that her sister's children, she believes, post her sister to 3 treatments that she did not want, that left her drained.  [name] goes further to say that her sister who is age 19 also has lung cancer.  She shares that this sister is a Clinical cytogeneticist", and is seeking treatment.  [name] states that she is not interested in treatment, possibly palliative radiation if needed for pain management.  She talks about her faith, and "standing on the rock".  All questions answered, support given.  HCPOA  NEXT OF KIN -[name] shares that her sister, [name] is her Ambulance person.  She shares that her sister knows what she does and does not want, and can abide her wishes.  She names  Audrey son, [name], as her second.   SUMMARY OF RECOMMENDATIONS   Home with the benefits of home health Continue follow-up with PCP Palliative radiation treatment if needed for pain management Activate hospice services when she is ready  Code Status/Advance Care Planning:  DNR  Symptom Management:   Per hospitalist, no additional needs at this time.  Palliative Prophylaxis:   Frequent Pain Assessment  Additional Recommendations (Limitations, Scope, Preferences):  Treat the treatable but no extraordinary measures such as CPR or intubation.  At this time seeking radiation only cancer treatment.  Psycho-social/Spiritual:   Desire for further Chaplaincy support:no  Additional Recommendations: Caregiving  Support/Resources and Education on Hospice  Prognosis:   Unable to determine, based on outcomes.  Discharge Planning: Anticipate return to home with home health services.  Hospice referral after radiation.      Primary Diagnoses: Present on Admission: . COPD exacerbation ([place]) . Morbid obesity ([place]) . Anxiety and depression . Essential hypertension . Uncontrolled diabetes mellitus with diabetic nephropathy ([place]) . Chronic respiratory failure with hypoxia ([place]) . DNR no code (do not resuscitate) . COPD (chronic obstructive pulmonary disease) ([place])  I have reviewed the medical record, interviewed the patient and family, and examined the patient. The following aspects are pertinent.  Past Medical History:  Diagnosis Date  . Anxiety   . COPD (chronic obstructive pulmonary disease) ([place])   . Depression 2008  . Diabetes mellitus, type 1 1983   type 2 diabetes  . DJD (degenerative joint disease)    of the spine   . GERD (gastroesophageal reflux disease)   . Hyperlipidemia 2007  . Hypertension 1983  . Hypothyroidism 2004   left thyroidectomy  . Insomnia   . Obesity    Social History   Socioeconomic History  . Marital status: Widowed    Spouse name:  Not on file  . Number of children: Not on file  . Years of education: Not on file  . Highest education level: Not on file  Occupational History  . Occupation: disable   Social Needs  . Financial resource strain: Not on file  . Food insecurity:    Worry: Not on file    Inability: Not on file  . Transportation needs:    Medical: Not on file    Non-medical: Not on file  Tobacco Use  . Smoking status: Current Every Day Smoker    Packs/day: 0.50    Years: 44.00    Pack years: 22.00    Types: Cigarettes  . Smokeless tobacco: Never Used  . Tobacco comment: current 15/ day, would like to smoke 7/ day  Substance and Sexual Activity  . Alcohol use: No  . Drug use: No  . Sexual activity: Not Currently  Lifestyle  . Physical activity:    Days per week: Not on file    Minutes per session: Not on file  . Stress: Not on file  Relationships  . Social connections:    Talks on phone: Not on file    Gets together: Not on file    Attends religious service: Not on file    Active member of club or organization: Not on file    Attends meetings of clubs or organizations: Not on file    Relationship status: Not on file  Other Topics Concern  . Not on file  Social History Narrative  . Not on file   Family History  Problem Relation Age of Onset  . Hypertension Mother   . Stroke Mother   . Cancer Father        lung   . Hypertension Sister   . Depression Sister   . Alcohol abuse Sister   . Hypertension Sister   . Depression Sister   . Cancer Sister   . Diabetes Sister   . Depression Sister   . Cancer Sister        renal   . Kidney failure Brother   . Kidney failure Unknown        family history   . Hypertension Unknown        family history    Scheduled Meds: . ARIPiprazole  2 mg Oral Daily  . aspirin EC  81 mg Oral Daily  . cloNIDine  0.2 mg Oral QHS  . clotrimazole-betamethasone   Topical BID  . doxycycline  100 mg Oral Q12H  . enoxaparin (LOVENOX) injection  50 mg  Subcutaneous Q24H  . feeding supplement (GLUCERNA SHAKE)  237 mL Oral TID BM  . ferrous sulfate  325 mg Oral QODAY  . FLUoxetine  40 mg Oral Daily   And  . FLUoxetine  20 mg Oral  QHS  . folic acid  1 mg Oral Daily  . furosemide  40 mg Oral Daily  . gabapentin  300 mg Oral QHS  . glipiZIDE  10 mg Oral Q breakfast  . glipiZIDE  2.5 mg Oral Q breakfast  . insulin aspart  0-20 Units Subcutaneous TID WC  . insulin aspart  0-5 Units Subcutaneous QHS  . ipratropium-albuterol  3 mL Nebulization QID  . levothyroxine  100 mcg Oral QAC breakfast  . lisinopril  5 mg Oral Daily  . mometasone-formoterol  2 puff Inhalation BID  . montelukast  10 mg Oral QHS  . nicotine  14 mg Transdermal Daily  . pantoprazole  80 mg Oral Q1200  . [START ON 02/20/2018] predniSONE  40 mg Oral Q breakfast  . rosuvastatin  20 mg Oral q1800   Continuous Infusions: PRN Meds:.acetaminophen **OR** acetaminophen, ALPRAZolam, ipratropium-albuterol, ondansetron **OR** ondansetron (ZOFRAN) IV, senna-docusate, zolpidem Medications Prior to Admission:  Prior to Admission medications   Medication Sig Start Date End Date Taking? Authorizing Provider  acetaminophen (TYLENOL) 325 MG tablet Take 650 mg by mouth every 6 (six) hours as needed.   Yes [provider]  ALPRAZolam Duanne Moron) 1 MG tablet TAKE 1 TABLET BY MOUTH FOUR TIMES DAILY 12/21/17  Yes Cloria Spring, MD  ARIPiprazole (ABILIFY) 2 MG tablet Take 1 tablet (2 mg total) by mouth daily. 12/21/17  Yes Cloria Spring, MD  aspirin (ASPIRIN LOW DOSE) 81 MG EC tablet Take 81 mg by mouth daily.     Yes [provider]  calcium carbonate (TUMS) 500 MG chewable tablet Chew 1 tablet by mouth as needed.     Yes [provider]  cloNIDine (CATAPRES) 0.2 MG tablet One tablet at bedtime for blood pressure 08/18/17  Yes Fayrene Helper, MD  clotrimazole-betamethasone (LOTRISONE) cream APPLY TO THE AFFECTED AREA TWICE DAILY 12/01/17  Yes Fayrene Helper, MD    esomeprazole (NEXIUM) 40 MG capsule TAKE 1 CAPSULE BY MOUTH EVERY MORNING 02/15/18  Yes Fayrene Helper, MD  ferrous sulfate 325 (65 FE) MG tablet Take 325 mg by mouth every other day.    Yes [provider]  FLUoxetine (PROZAC) 20 MG capsule Take two in the am and one in the evening 12/21/17  Yes Cloria Spring, MD  Fluticasone-Salmeterol (ADVAIR DISKUS) 250-50 MCG/DOSE AEPB inhale 1 dose by mouth twice a day 04/09/15  Yes Fayrene Helper, MD  folic acid (FOLVITE) 017 MCG tablet Take 400 mcg by mouth daily.   Yes [provider]  furosemide (LASIX) 40 MG tablet TAKE 1 TABLET BY MOUTH DAILY 12/30/17  Yes Fayrene Helper, MD  gabapentin (NEURONTIN) 300 MG capsule TAKE ONE CAPSULE BY MOUTH AT BEDTIME 02/15/18  Yes Fayrene Helper, MD  glipiZIDE (GLUCOTROL XL) 10 MG 24 hr tablet Take 1 tablet (10 mg total) by mouth daily with breakfast. Dose reduction effective 03/19/20219 02/10/18  Yes Fayrene Helper, MD  glipiZIDE (GLUCOTROL XL) 2.5 MG 24 hr tablet Take 1 tablet (2.5 mg total) by mouth daily with breakfast. 11/24/17  Yes Fayrene Helper, MD  ipratropium-albuterol (DUONEB) 0.5-2.5 (3) MG/3ML SOLN Take 3 mLs by nebulization 4 (four) times daily as needed.    Yes [provider]  levalbuterol (XOPENEX HFA) 45 MCG/ACT inhaler Inhale 1-2 puffs into the lungs as needed.     Yes [provider]  levothyroxine (SYNTHROID, LEVOTHROID) 100 MCG tablet TAKE 1 TABLET ONCE DAILY 10/05/17  Yes Fayrene Helper, MD  lisinopril (PRINIVIL,ZESTRIL) 5 MG tablet Take 1 tablet (5 mg total) by mouth daily. 02/10/18  Yes Fayrene Helper, MD  montelukast (SINGULAIR) 10 MG tablet TAKE 1 TABLET BY MOUTH AT BEDTIME 10/19/17  Yes Fayrene Helper, MD  rosuvastatin (CRESTOR) 20 MG tablet TAKE 1 TABLET ONCE DAILY 02/15/18  Yes Fayrene Helper, MD  Vitamin D, Ergocalciferol, (DRISDOL) 50000 units CAPS capsule TAKE 1 CAPSULE BY MOUTH ONCE WEEKLY 12/30/17  Yes Fayrene Helper, MD  zolpidem (AMBIEN) 10 MG tablet TAKE 1 TABLET(10 MG) BY MOUTH AT BEDTIME 12/21/17  Yes Cloria Spring, MD  esomeprazole ([place]) 40 MG capsule TAKE 1 CAPSULE BY MOUTH EVERY MORNING Patient not taking: Reported on 02/18/2018 02/10/18   Fayrene Helper, MD  Insulin Pen Needle 31G X 5 MM MISC Use once daily to inject insulin dx E11.65 08/12/16   Fayrene Helper, MD  predniSONE (DELTASONE) 10 MG tablet Take 1 tablet by mouth daily. 11/27/17   [provider]   Allergies  Allergen Reactions  . Codeine     REACTION: nausea   Review of Systems  Unable to perform ROS: Other    Physical Exam  Constitutional: She is oriented to person, place, and time.  Makes and keeps eye contact, obese, appears chronically ill  HENT:  Head: Normocephalic and atraumatic.  Cardiovascular: Normal rate.  Pulmonary/Chest: Effort normal.  High flow oxygen requirements  Abdominal: Soft.  Obese abdomen  Musculoskeletal:       Right lower leg: She exhibits no edema.       Left lower leg: She exhibits no edema.  Neurological: She is alert and oriented to person, place, and time.  Skin: Skin is warm and dry.  Psychiatric: Her mood appears not anxious. She is not agitated.  Pleasant, not fearful  Nursing note and vitals reviewed.   Vital Signs: BP (!) 146/87   Pulse 87   Temp 98.3 F (36.8 C)   Resp 20   Ht 5\' 3"  (1.6 m)   Wt 101.9 kg   SpO2 93%   BMI 39.79 kg/m  Pain Scale: 0-10   Pain Score: 0-No pain   SpO2: SpO2: 93 % O2 Device:SpO2: 93 % O2 Flow Rate: .O2 Flow Rate (L/min): 8 L/min  IO: Intake/output summary:   Intake/Output Summary (Last 24 hours) at 02/19/2018 1548 Last data filed at 02/19/2018 0540 Gross per 24 hour  Intake 480 ml  Output -  Net 480 ml    LBM: Last BM Date: 02/17/18 Baseline Weight: Weight: 108 kg Most recent weight: Weight: 101.9 kg     Palliative Assessment/Data:   Flowsheet Rows     Most Recent Value  Intake Tab  Referral  Department  Hospitalist  Unit at Time of Referral  Med/Surg Unit  Palliative Care Primary Diagnosis  Pulmonary  Date Notified  02/19/18  Palliative Care Type  New Palliative care  Reason for referral  Clarify Goals of Care  Date of Admission  02/18/18  Date first seen by Palliative Care  02/19/18  # of days Palliative referral response time  0 Day(s)  # of days IP prior to Palliative referral  1  Clinical Assessment  Palliative Performance Scale Score  40%  Pain Max last 24 hours  Not able to report  Pain Min Last 24 hours  Not able to report  Dyspnea Max Last 24 Hours  Not able to report  Dyspnea Min Last 24 hours  Not able to report  Psychosocial &  Spiritual Assessment  Palliative Care Outcomes  Patient/Family meeting held?  Yes  Palliative Care Outcomes  Clarified goals of care, Provided psychosocial or spiritual support, Counseled regarding hospice  Patient/Family wishes: Interventions discontinued/not started   Mechanical Ventilation      Time In: 1510 Time Out: 1600 Time Total: 50 minutes Greater than 50%  of this time was spent counseling and coordinating care related to the above assessment and plan.  Signed by: Drue Novel, NP   Please contact Palliative Medicine Team phone at [phone] for questions and concerns.  For individual provider: See Shea Evans

## 2018-02-20 DIAGNOSIS — F329 Major depressive disorder, single episode, unspecified: Secondary | ICD-10-CM

## 2018-02-20 DIAGNOSIS — F419 Anxiety disorder, unspecified: Secondary | ICD-10-CM

## 2018-02-20 LAB — GLUCOSE, CAPILLARY
GLUCOSE-CAPILLARY: 323 mg/dL — AB (ref 70–99)
Glucose-Capillary: 405 mg/dL — ABNORMAL HIGH (ref 70–99)
Glucose-Capillary: 377 mg/dL — ABNORMAL HIGH (ref 70–99)

## 2018-02-20 MED ORDER — GUAIFENESIN ER 600 MG PO TB12: 600.0000 mg | ORAL_TABLET | Freq: Two times a day (BID) | ORAL | 2 refills | Status: AC

## 2018-02-20 MED ORDER — DOXYCYCLINE HYCLATE 100 MG PO TABS: 100.0000 mg | ORAL_TABLET | Freq: Two times a day (BID) | ORAL | 0 refills | Status: DC

## 2018-02-20 MED ORDER — PREDNISONE 10 MG PO TABS: ORAL_TABLET | ORAL | 0 refills | Status: DC

## 2018-02-20 NOTE — Progress Notes (Signed)
Patient states understanding of discharge instructions.  

## 2018-02-20 NOTE — Discharge Summary (Signed)
Physician Discharge Summary  [name] FBP:[id number] DOB: 1951/01/13 DOA: 02/18/2018  PCP: Fayrene Helper, MD  Admit date: 02/18/2018 Discharge date: 02/20/2018  Admitted From: Home Disposition: Home  Recommendations for Outpatient Follow-up:  1. Follow up with PCP in 1-2 weeks 2. Please obtain BMP/CBC in one week 3. Follow-up with Dr. Luan Pulling in the next 2 to 3 weeks  Home Health: Home health RN, PT Equipment/Devices:  Discharge Condition: Stable CODE STATUS: DNR Diet recommendation: Heart healthy, carb modified  Brief/Interim Summary: 67 year old female with chronic respiratory failure on home oxygen related to COPD, admitted to the hospital with shortness of breath and found to have COPD exacerbation.  She was treated with intravenous steroids, bronchodilators and antibiotics.  Respiratory status has improved now and is approaching baseline.  She was placed on a prednisone taper.  Will complete a course of antibiotics.  Continue on bronchodilators.  She did have uncontrolled diabetes with hyperglycemia.  This was precipitated by steroids.  Anticipate blood sugar control should improve as steroids are tapered.  She does have a known lung mass for which she has not undergone biopsy.  She was seen by palliative care to discuss goals of care.  Recommendations were to discharge home with home health.  Patient plans to undergo palliative radiation treatments if needed for pain management.  She is not ready to consider hospice services as of yet.  She was inquiring about the risks of undergoing a biopsy.  I have advised her to discuss this further with her pulmonologist.  The remainder of her medical problems remained stable.  She is stable for discharge.  Discharge Diagnoses:  Principal Problem:   COPD exacerbation ([place]) Active Problems:   Morbid obesity ([place])   Anxiety and depression   Essential hypertension   Uncontrolled diabetes mellitus with diabetic nephropathy (HCC)    Chronic respiratory failure with hypoxia ([place])   DNR no code (do not resuscitate)   Hypokalemia   Lung mass- suspected Lung Cancer   COPD (chronic obstructive pulmonary disease) (HCC)   Mass of upper lobe of right lung   Goals of care, counseling/discussion   Palliative care by specialist   Encounter for hospice care discussion    Discharge Instructions  Discharge Instructions    Diet - low sodium heart healthy   Complete by:  As directed    Increase activity slowly   Complete by:  As directed      Allergies as of 02/20/2018      Reactions   Codeine    REACTION: nausea      Medication List    STOP taking these medications   azithromycin 250 MG tablet Commonly known as:  ZITHROMAX     TAKE these medications   acetaminophen 325 MG tablet Commonly known as:  TYLENOL Take 650 mg by mouth every 6 (six) hours as needed.   ALPRAZolam 1 MG tablet Commonly known as:  XANAX TAKE 1 TABLET BY MOUTH FOUR TIMES DAILY   ARIPiprazole 2 MG tablet Commonly known as:  ABILIFY Take 1 tablet (2 mg total) by mouth daily.   ASPIRIN LOW DOSE 81 MG EC tablet Generic drug:  aspirin Take 81 mg by mouth daily.   cloNIDine 0.2 MG tablet Commonly known as:  CATAPRES One tablet at bedtime for blood pressure   clotrimazole-betamethasone cream Commonly known as:  LOTRISONE APPLY TO THE AFFECTED AREA TWICE DAILY   doxycycline 100 MG tablet Commonly known as:  VIBRA-TABS Take 1 tablet (100 mg total) by  mouth every 12 (twelve) hours.   DUONEB 0.5-2.5 (3) MG/3ML Soln Generic drug:  ipratropium-albuterol Take 3 mLs by nebulization 4 (four) times daily as needed.   esomeprazole 40 MG capsule Commonly known as:  NEXIUM TAKE 1 CAPSULE BY MOUTH EVERY MORNING What changed:  Another medication with the same name was removed. Continue taking this medication, and follow the directions you see here.   ferrous sulfate 325 (65 FE) MG tablet Take 325 mg by mouth every other day.   FLUoxetine  20 MG capsule Commonly known as:  PROZAC Take two in the am and one in the evening   Fluticasone-Salmeterol 250-50 MCG/DOSE Aepb Commonly known as:  ADVAIR inhale 1 dose by mouth twice a day   folic acid 191 MCG tablet Commonly known as:  FOLVITE Take 400 mcg by mouth daily.   furosemide 40 MG tablet Commonly known as:  LASIX TAKE 1 TABLET BY MOUTH DAILY   gabapentin 300 MG capsule Commonly known as:  NEURONTIN TAKE ONE CAPSULE BY MOUTH AT BEDTIME   glipiZIDE 2.5 MG 24 hr tablet Commonly known as:  GLUCOTROL XL Take 1 tablet (2.5 mg total) by mouth daily with breakfast.   glipiZIDE 10 MG 24 hr tablet Commonly known as:  GLUCOTROL XL Take 1 tablet (10 mg total) by mouth daily with breakfast. Dose reduction effective 03/19/20219   guaiFENesin 600 MG 12 hr tablet Commonly known as:  MUCINEX Take 1 tablet (600 mg total) by mouth 2 (two) times daily.   Insulin Pen Needle 31G X 5 MM Misc Use once daily to inject insulin dx E11.65   levothyroxine 100 MCG tablet Commonly known as:  SYNTHROID, LEVOTHROID TAKE 1 TABLET ONCE DAILY   lisinopril 5 MG tablet Commonly known as:  PRINIVIL,ZESTRIL Take 1 tablet (5 mg total) by mouth daily.   montelukast 10 MG tablet Commonly known as:  SINGULAIR TAKE 1 TABLET BY MOUTH AT BEDTIME   predniSONE 10 MG tablet Commonly known as:  DELTASONE Take 40mg  po daily for 3 days then 30mg  daily for 3 days then 20mg  daily for 3 days then 10mg  daily for 3 days then stop What changed:    how much to take  how to take this  when to take this  additional instructions   rosuvastatin 20 MG tablet Commonly known as:  CRESTOR TAKE 1 TABLET ONCE DAILY   TUMS 500 MG chewable tablet Generic drug:  calcium carbonate Chew 1 tablet by mouth as needed.   Vitamin D (Ergocalciferol) 50000 units Caps capsule Commonly known as:  DRISDOL TAKE 1 CAPSULE BY MOUTH ONCE WEEKLY   XOPENEX HFA 45 MCG/ACT inhaler Generic drug:  levalbuterol Inhale 1-2  puffs into the lungs as needed.   zolpidem 10 MG tablet Commonly known as:  AMBIEN TAKE 1 TABLET(10 MG) BY MOUTH AT BEDTIME       Allergies  Allergen Reactions  . Codeine     REACTION: nausea    Consultations:  Palliative care   Procedures/Studies: Ct Chest W Contrast  Result Date: 02/18/2018 CLINICAL DATA:  Chronic shortness of breath, worse in the past 1.5 weeks. EXAM: CT CHEST WITH CONTRAST TECHNIQUE: Multidetector CT imaging of the chest was performed during intravenous contrast administration. CONTRAST:  40mL OMNIPAQUE IOHEXOL 300 MG/ML  SOLN COMPARISON:  12/18/2016 FINDINGS: Cardiovascular: Thoracic aortic atherosclerosis without aneurysm. No central pulmonary embolus on this nondedicated study. Unchanged enlargement of the main pulmonary artery, 3.5 cm diameter. Left main and three-vessel coronary artery atherosclerosis. Right-sided cardiac chamber  enlargement. Small pericardial effusion. Mediastinum/Nodes: Mediastinal lymphadenopathy has not significantly changed with paratracheal lymph nodes measuring up to 1.6 cm and a subcarinal lymph node measuring 2.0 cm. Worsening right hilar lymphadenopathy is present including a conglomerate 4.7 x 3.6 cm suprahilar mass. Grossly unremarkable thyroid. Small sliding hiatal hernia. Lungs/Pleura: There is no pleural effusion or pneumothorax. A 3.1 x 3.0 cm mass is present in the posterior right upper lobe contacting the pleura and reflecting enlargement of the 1.4 cm nodule previously seen in this location. There is underlying centrilobular emphysema. Fibrotic interstitial lung changes are similar to the prior CT and are greatest in the right upper lobe with associated traction bronchiectasis. Mild atelectasis is noted in the right greater than left lung bases. Upper Abdomen: Partially visualized diffuse body wall edema. Musculoskeletal: No suspicious osseous lesion. IMPRESSION: 1. 3.1 cm right upper lobe lung mass, greatly enlarged from 2018 and  most consistent with bronchogenic carcinoma. 2. Worsening right hilar lymphadenopathy including a 4.7 cm conglomerate suprahilar mass. 3. Unchanged mediastinal lymphadenopathy. 4. Unchanged pulmonary artery enlargement suggesting pulmonary arterial hypertension. 5. Similar appearance of pulmonary interstitial fibrosis. Aortic Atherosclerosis (ICD10-I70.0) and Emphysema (ICD10-J43.9). Electronically Signed   By: Logan Bores M.D.   On: 02/18/2018 15:13   Dg Chest Port 1 View  Result Date: 02/18/2018 CLINICAL DATA:  Shortness of breath after fall. EXAM: PORTABLE CHEST 1 VIEW COMPARISON:  10/24/2016 FINDINGS: Cardiomegaly. Vascular congestion and diffuse interstitial prominence, likely edema. More confluent airspace opacities in the right upper lobe and left perihilar region. No visible effusions or pneumothorax. No acute bony abnormality. IMPRESSION: Cardiomegaly.  Suspect mild interstitial edema. More confluent opacities in the right upper lobe and left perihilar region could reflect asymmetric edema or infection. Electronically Signed   By: Rolm Baptise M.D.   On: 02/18/2018 09:33       Subjective: Feeling better today.  Breathing has improved and is approaching baseline.  Wheezing is better.  Discharge Exam: Vitals:   02/20/18 0635 02/20/18 0730 02/20/18 1121 02/20/18 1514  BP: (!) 148/96     Pulse:      Resp:      Temp:      TempSrc:      SpO2:  98% 95% 95%  Weight:      Height:        General: Pt is alert, awake, not in acute distress Cardiovascular: RRR, S1/S2 +, no rubs, no gallops Respiratory: Mild wheeze bilaterally Abdominal: Soft, NT, ND, bowel sounds + Extremities: no edema, no cyanosis    The results of significant diagnostics from this hospitalization (including imaging, microbiology, ancillary and laboratory) are listed below for reference.     Microbiology: No results found for this or any previous visit (from the past 240 hour(s)).   Labs: BNP (last 3  results) Recent Labs    02/18/18 0927  BNP 4,580.9*   Basic Metabolic Panel: Recent Labs  Lab 02/18/18 0927  NA 143  K 3.1*  CL 101  CO2 30  GLUCOSE 248*  BUN 12  CREATININE 1.06*  CALCIUM 9.0   Liver Function Tests: Recent Labs  Lab 02/18/18 0927  AST 24  ALT 19  ALKPHOS 90  BILITOT 1.0  PROT 7.1  ALBUMIN 3.7   No results for input(s): LIPASE, AMYLASE in the last 168 hours. No results for input(s): AMMONIA in the last 168 hours. CBC: Recent Labs  Lab 02/18/18 0927  WBC 9.9  NEUTROABS 8.7*  HGB 14.6  HCT 45.9  MCV 97.2  PLT 244   Cardiac Enzymes: No results for input(s): CKTOTAL, CKMB, CKMBINDEX, TROPONINI in the last 168 hours. BNP: Invalid input(s): POCBNP CBG: Recent Labs  Lab 02/19/18 1633 02/19/18 2051 02/20/18 0733 02/20/18 1115 02/20/18 1208  GLUCAP 117* 235* 323* 405* 377*   D-Dimer No results for input(s): DDIMER in the last 72 hours. Hgb A1c Recent Labs    02/18/18 0927  HGBA1C 9.1*   Lipid Profile No results for input(s): CHOL, HDL, LDLCALC, TRIG, CHOLHDL, LDLDIRECT in the last 72 hours. Thyroid function studies No results for input(s): TSH, T4TOTAL, T3FREE, THYROIDAB in the last 72 hours.  Invalid input(s): FREET3 Anemia work up No results for input(s): VITAMINB12, FOLATE, FERRITIN, TIBC, IRON, RETICCTPCT in the last 72 hours. Urinalysis    Component Value Date/Time   COLORURINE STRAW (A) 07/18/2016 1151   APPEARANCEUR CLEAR 07/18/2016 1151   LABSPEC 1.011 07/18/2016 1151   PHURINE 5.0 07/18/2016 1151   GLUCOSEU NEGATIVE 07/18/2016 1151   HGBUR NEGATIVE 07/18/2016 [place] 07/18/2016 1151   KETONESUR NEGATIVE 07/18/2016 1151   PROTEINUR NEGATIVE 07/18/2016 1151   NITRITE NEGATIVE 07/18/2016 1151   LEUKOCYTESUR NEGATIVE 07/18/2016 1151   Sepsis Labs Invalid input(s): PROCALCITONIN,  WBC,  LACTICIDVEN Microbiology No results found for this or any previous visit (from the past 240  hour(s)).   Time coordinating discharge: 68mins  SIGNED:   Kathie Dike, MD  [facility] 02/20/2018, 5:09 PM Pager   If 7PM-7AM, please contact night-coverage [email or web address] Password [email or web address]

## 2018-02-22 ENCOUNTER — Telehealth: Payer: Self-pay

## 2018-02-22 NOTE — Telephone Encounter (Signed)
[id number]  Attempted to contact patient to complete [facility] telephone call. No answer.No vm.Will try again later. 1st attempt

## 2018-02-23 NOTE — Telephone Encounter (Signed)
[id number]  Attempted to contact patient to complete [facility] telephone call. No answer. Unable to leave vm. Will try again later. 2nd attempt

## 2018-02-23 NOTE — Telephone Encounter (Signed)
[id number]  Attempted to contact patient to complete [facility] telephone call. No answer. Left message requesting call back.3rd attempt

## 2018-02-26 ENCOUNTER — Other Ambulatory Visit: Payer: Self-pay | Admitting: *Deleted

## 2018-02-26 NOTE — Patient Outreach (Signed)
[facility]) Care Management  02/26/2018  [name] 04/17/1951 [id number]   EMMI- general discharge  RED ON EMMI ALERT Day # 4 Date: Thurs 02/25/18 1025  Red Alert Reason: other questions/problems? Yes Sad/hopeless/anxious/empty? Yes   And Day #1 Mon 02/22/18 1137 unfilled prescriptions? Yes  Other questions/problems? Yes    Outreach attempt # 1 Patient is able to verify HIPAA [name] Care Management RN reviewed and addressed red alert with patient [name] confirms she has questions about the status of her home health and palliative services discussed with her at [facility] She states she was informed she would hear from [facility] in 48 hours after discharge She states she does not have an answering machine to receive voice messages She reports not hearing from [facility] nor palliative care. Epic indicates she was seen by a palliative care staff while hospitalized and the note indicates palliative care could begin when she is ready. She reports Dr Luan Pulling discussed palliative care    Social: [name] lives with her nephew and has support also from her sister. She is widowed. She is independent/assist with her ADLs and iADLs She uses a cane for mobility  Conditions:  COPD, HTN, DM type II, PVD, allergic rhinitis, GERD, hypothyroidism, hearing loss, osteoarthritis of spine, hyperlipidemia, nicotine addiction, anxiety and depression, deficiency anemia, right upper lobe lung mass   Medications: denies concerns with taking medications as prescribed, affording medications, side effects of medications and questions about medications  Appointments: 03/29/18 with Dr [name]   [facility] Directives: She has a living will, POA and MOST forms completed   Consent: [facility] RN CM reviewed [facility] services with patient. Patient gave verbal consent for services. Advised patient that there will be further automated EMMI- post discharge calls to assess how the  patient is doing following the recent hospitalization Advised the patient that another call may be received from a nurse if any of their responses were abnormal. Patient voiced understanding and was appreciative of f/u call.   Plan: [name] RN CM to follow up home health services status with [name] within 4 business days and for further assessment of needs  [facility] RN CM and [name] called and spoke with [name] and [name] at [facility] to confirm [facility] had a different number on file for [name].  The home number was updated but [name] ws not able to find that orders were placed for home health from the [name]. [name] recommended attempt to get orders from the discharging MD or the primary MD, Audrey Quint RN CM with [name] consulted staff at [facility] with attempts to locate the discharging MD today but unsuccessful. Not finding a referral   [name] prefers to have Dr [name] contacted.  CM sent an Epic in basket message to Dr [name] to request assist with home health orders for RN, PT/OT (as discussed with the patient) An attempt to reach palliative care staff unsuccessful   Kimberly L. Lavina Hamman, RN, BSN, [place] Coordinator Office number [phone] Mobile number [phone]  Main [facility] number [phone] Fax number [phone]

## 2018-03-01 ENCOUNTER — Telehealth: Payer: Self-pay

## 2018-03-01 ENCOUNTER — Telehealth: Payer: Self-pay | Admitting: Family Medicine

## 2018-03-01 ENCOUNTER — Other Ambulatory Visit: Payer: Self-pay | Admitting: *Deleted

## 2018-03-01 NOTE — Telephone Encounter (Signed)
Transition Care Management Follow-up Telephone Call   Date discharged?02/20/2018 (attempted to make contact with patient 3 times upon her discharge but was unsuccessful)                How have you been since you were released from the hospital? I feel weak and tired   Do you understand why you were in the hospital? I went in for COPD and they found lung cancer.   Do you understand the discharge instructions? yes   Where were you discharged to? home   Items Reviewed:  Medications reviewed: yes  Allergies reviewed: yes  Dietary changes reviewed: heart healthy  Referrals reviewed: Dr.Hawkins   Functional Questionnaire:   Activities of Daily Living (ADLs):  discharged with home health referral rn and pt    Any transportation issues/concerns?: no   Any patient concerns? concerned over cancer, wants follow up to discuss   Confirmed importance and date/time of follow-up visits scheduled Mar 02, 2018 at 9:40am     Confirmed with patient if condition begins to worsen call PCP or go to the ER.  Patient was given the office number and encouraged to call back with question or concerns.  yes with verbal understanding

## 2018-03-01 NOTE — Telephone Encounter (Signed)
WILL ADDRESS WHEN I SEE PT ON 10 /06/2017

## 2018-03-01 NOTE — Patient Outreach (Signed)
[facility]) Care Management  03/01/2018  [name] 31-Oct-1950 [id number]   Care coordination  [facility] RN CM left a voice message at [phone], Dr Bari Mantis office requesting assistance with fax orders to be sent for home health RN OT, PT to [facility]  Left CM contact information for availability to answer questions  Livia Tarr L. Lavina Hamman, RN, BSN, [place] Coordinator Office number [phone] Mobile number [phone]  Main [facility] number [phone] Fax number [phone]

## 2018-03-01 NOTE — Telephone Encounter (Signed)
Kim with [facility] is calling regarding the PT, and needs orders placed, she sent a inbox to Dr Moshe Cipro

## 2018-03-01 NOTE — Patient Outreach (Signed)
[facility]) Care Management  03/01/2018  [name] 08-25-1950 [id number]   Care coordination  [facility] RN CM left a voice message at [phone] for [name] to inquire about palliative/hopsice status for patient Cm left CM contact number and the patient's contact number   Alexsandria Kivett L. Lavina Hamman, RN, BSN, [place] Coordinator Office number [phone] Mobile number [phone]  Main [facility] number [phone] Fax number [phone]

## 2018-03-01 NOTE — Patient Outreach (Signed)
[facility]) Care Management  03/01/2018  ANYIA GIERKE [birth year] [id number]   Care coordination    [facility] RN CM also spoke with the patient who states she has called and left a message at Dr Moshe Cipro office on 03/01/18 am at [phone] and is pending a response.  She confirms Dr Luan Pulling was involved with palliative CM updated her about messages CM left for Dr Moshe Cipro office [phone] and palliative care staff with pending responses  After Cm spoke with the patient, Threasa Beards of Palliative medicine called to confirm that the pt was not ready for palliative after the hospital consult and home health was requested instead.  Palliative services will not be initiated until her home health is completed.   [facility] RN CM spoke with Gwinda Passe at Dr Moshe Cipro office [phone] to discuss [facility] RN, PT OT orders. Cm routed CM's 02/26/18 [facility] RN CM note to Dr Moshe Cipro. CM clarified the patient's contact number as [phone].  Sung Renton L. Lavina Hamman, RN, BSN, [place] Coordinator Office number [phone] Mobile number [phone]  Main [facility] number [phone] Fax number [phone]

## 2018-03-01 NOTE — Telephone Encounter (Signed)
Pt is calling as Maudie Mercury told her to call you

## 2018-03-01 NOTE — Patient Outreach (Signed)
[facility]) Care Management  03/01/2018  [name] [birth year] [id number]   Duplicate error note   [name] L. Lavina Hamman, RN, BSN, [place] Coordinator Office number [phone] Mobile number [phone]  Main [facility] number [phone] Fax number [phone]

## 2018-03-02 ENCOUNTER — Ambulatory Visit: Payer: Self-pay | Admitting: *Deleted

## 2018-03-02 ENCOUNTER — Encounter: Payer: Self-pay | Admitting: Family Medicine

## 2018-03-02 ENCOUNTER — Telehealth: Payer: Self-pay | Admitting: Family Medicine

## 2018-03-02 ENCOUNTER — Other Ambulatory Visit: Payer: Self-pay

## 2018-03-02 ENCOUNTER — Ambulatory Visit (INDEPENDENT_AMBULATORY_CARE_PROVIDER_SITE_OTHER): Payer: Medicare Other | Admitting: Family Medicine

## 2018-03-02 VITALS — BP 124/62 | HR 61 | Resp 12 | Ht 63.0 in | Wt 213.0 lb

## 2018-03-02 DIAGNOSIS — Z09 Encounter for follow-up examination after completed treatment for conditions other than malignant neoplasm: Secondary | ICD-10-CM

## 2018-03-02 DIAGNOSIS — I10 Essential (primary) hypertension: Secondary | ICD-10-CM | POA: Diagnosis not present

## 2018-03-02 DIAGNOSIS — R928 Other abnormal and inconclusive findings on diagnostic imaging of breast: Secondary | ICD-10-CM

## 2018-03-02 DIAGNOSIS — E1121 Type 2 diabetes mellitus with diabetic nephropathy: Secondary | ICD-10-CM | POA: Diagnosis not present

## 2018-03-02 DIAGNOSIS — J441 Chronic obstructive pulmonary disease with (acute) exacerbation: Secondary | ICD-10-CM | POA: Diagnosis not present

## 2018-03-02 DIAGNOSIS — E1165 Type 2 diabetes mellitus with hyperglycemia: Secondary | ICD-10-CM | POA: Diagnosis not present

## 2018-03-02 DIAGNOSIS — IMO0002 Reserved for concepts with insufficient information to code with codable children: Secondary | ICD-10-CM

## 2018-03-02 LAB — BASIC METABOLIC PANEL WITH GFR
BUN: 12 mg/dL (ref 7–25)
CHLORIDE: 94 mmol/L — AB (ref 98–110)
CO2: 35 mmol/L — ABNORMAL HIGH (ref 20–32)
Calcium: 9.6 mg/dL (ref 8.6–10.4)
Creat: 0.97 mg/dL (ref 0.50–0.99)
GFR, Est African American: 71 mL/min/{1.73_m2} (ref >=60)
GFR, Est Non African American: 61 mL/min/{1.73_m2} (ref >=60)
GLUCOSE: 219 mg/dL — AB (ref 65–139)
Potassium: 4.4 mmol/L (ref 3.5–5.3)
SODIUM: 140 mmol/L (ref 135–146)

## 2018-03-02 LAB — CBC
HCT: 46.1 % — ABNORMAL HIGH (ref 35.0–45.0)
Hemoglobin: 14.8 g/dL (ref 11.7–15.5)
MCH: 29.4 pg (ref 27.0–33.0)
MCHC: 32.1 g/dL (ref 32.0–36.0)
MCV: 91.7 fL (ref 80.0–100.0)
MPV: 10 fL (ref 7.5–12.5)
PLATELETS: 311 10*3/uL (ref 140–400)
RBC: 5.03 10*6/uL (ref 3.80–5.10)
RDW: 12.1 % (ref 11.0–15.0)
WBC: 14.4 10*3/uL — AB (ref 3.8–10.8)

## 2018-03-02 NOTE — Patient Instructions (Addendum)
F/u in 4.5 weeks , call iof you need me before]  Labs today CBC, chem 7 and EGFR today  Change eating habit to reduce blood sugar  We will get appointment with Dr Luan Pulling to further discuss possible bronchoscopy and management of your lung mass, give yoursef a chance to beat this  Please get mammogram which is long ovrdue asap we will give you appt info

## 2018-03-02 NOTE — Telephone Encounter (Signed)
Please update the mammo to Dx\ultrasound of right breast

## 2018-03-02 NOTE — Telephone Encounter (Signed)
This has been entered.

## 2018-03-02 NOTE — Telephone Encounter (Signed)
Patient seen in the office today.

## 2018-03-03 ENCOUNTER — Other Ambulatory Visit: Payer: Self-pay | Admitting: *Deleted

## 2018-03-03 NOTE — Patient Outreach (Addendum)
  [facility]) Care Management  03/03/2018  KAMAYA KECKLER 10-Aug-1950 [id number]  EMMI- general discharge f/u - case closure   Patient is able to verify HIPAA Reviewed and addressed f/u from calls to primary on 03/01/18 with patient Mrs Setzer states she feels better today The patient confirmed she went to see Dr Moshe Cipro 03/02/18 0920 and all EMMI issues are being addressed by Dr Moshe Cipro.  Scheduled pt for another mammogram and had labs completed  Dr Moshe Cipro to consult with Dr Luan Pulling per the pt Home health pending at this tie   Plans [facility] RN CM will close case at this time as patient has been assessed and needs resolved .   Pt encouraged to return a call to [facility] RN CM prn   Jeorgia Helming L. Lavina Hamman, RN, BSN, [place] Coordinator Office number [phone] Mobile number [phone]  Main [facility] number [phone] Fax number [phone]

## 2018-03-05 ENCOUNTER — Telehealth: Payer: Self-pay | Admitting: Family Medicine

## 2018-03-05 ENCOUNTER — Telehealth: Payer: Self-pay

## 2018-03-05 ENCOUNTER — Other Ambulatory Visit: Payer: Self-pay

## 2018-03-05 ENCOUNTER — Other Ambulatory Visit: Payer: Self-pay | Admitting: Family Medicine

## 2018-03-05 DIAGNOSIS — Z9981 Dependence on supplemental oxygen: Secondary | ICD-10-CM

## 2018-03-05 DIAGNOSIS — N6310 Unspecified lump in the right breast, unspecified quadrant: Secondary | ICD-10-CM

## 2018-03-05 DIAGNOSIS — J441 Chronic obstructive pulmonary disease with (acute) exacerbation: Secondary | ICD-10-CM

## 2018-03-05 NOTE — Telephone Encounter (Signed)
Patient discharged from [facility] 02/26/18 for COPD. The physician did not order home health, physical therapy, nor skilled nursing for patient. Patient is weak and requesting glycemia, I let her know we had some boxes in the office she can pick up. Please advise request for orders.

## 2018-03-05 NOTE — Telephone Encounter (Signed)
Do you want to order nursing and PT services? (if PT, what frequency)

## 2018-03-05 NOTE — Telephone Encounter (Signed)
Order entered

## 2018-03-05 NOTE — Telephone Encounter (Signed)
Yes please, nursing weekly x 4 weeks, PT/ OT twice weekly x 6 weeks. Glucerna is what  She is asking for may get if we have [facility]/ should be involved as well

## 2018-03-05 NOTE — Telephone Encounter (Signed)
Patient notified of breast ultrasound and mammogram appt for Oct 10th at 1:50pm at the [facility] in [place] with verbal understanding.

## 2018-03-08 NOTE — Telephone Encounter (Signed)
referred

## 2018-03-09 ENCOUNTER — Other Ambulatory Visit: Payer: Self-pay

## 2018-03-09 ENCOUNTER — Other Ambulatory Visit: Payer: Self-pay | Admitting: Family Medicine

## 2018-03-09 DIAGNOSIS — N6310 Unspecified lump in the right breast, unspecified quadrant: Secondary | ICD-10-CM

## 2018-03-10 ENCOUNTER — Telehealth: Payer: Self-pay | Admitting: Family Medicine

## 2018-03-10 NOTE — Telephone Encounter (Signed)
Patient called confused because she was contacted by 2 home health care companies and would like a call back to clarify whom she needs to speak to. Please advise.

## 2018-03-11 ENCOUNTER — Ambulatory Visit
Admission: RE | Admit: 2018-03-11 | Discharge: 2018-03-11 | Disposition: A | Discharge status: Home / Self Care | Payer: Medicare Other | Source: Ambulatory Visit | Attending: Family Medicine | Admitting: Family Medicine

## 2018-03-11 DIAGNOSIS — N6310 Unspecified lump in the right breast, unspecified quadrant: Secondary | ICD-10-CM

## 2018-03-11 DIAGNOSIS — J441 Chronic obstructive pulmonary disease with (acute) exacerbation: Secondary | ICD-10-CM | POA: Diagnosis not present

## 2018-03-11 DIAGNOSIS — E1165 Type 2 diabetes mellitus with hyperglycemia: Secondary | ICD-10-CM | POA: Diagnosis not present

## 2018-03-11 DIAGNOSIS — J9611 Chronic respiratory failure with hypoxia: Secondary | ICD-10-CM | POA: Diagnosis not present

## 2018-03-11 DIAGNOSIS — N6489 Other specified disorders of breast: Secondary | ICD-10-CM | POA: Diagnosis not present

## 2018-03-11 DIAGNOSIS — F418 Other specified anxiety disorders: Secondary | ICD-10-CM | POA: Diagnosis not present

## 2018-03-11 DIAGNOSIS — R928 Other abnormal and inconclusive findings on diagnostic imaging of breast: Secondary | ICD-10-CM | POA: Diagnosis not present

## 2018-03-11 DIAGNOSIS — M6281 Muscle weakness (generalized): Secondary | ICD-10-CM | POA: Diagnosis not present

## 2018-03-11 DIAGNOSIS — E1121 Type 2 diabetes mellitus with diabetic nephropathy: Secondary | ICD-10-CM | POA: Diagnosis not present

## 2018-03-11 NOTE — Telephone Encounter (Signed)
Decided to [facility] with [facility]

## 2018-03-17 DIAGNOSIS — J9611 Chronic respiratory failure with hypoxia: Secondary | ICD-10-CM | POA: Diagnosis not present

## 2018-03-17 DIAGNOSIS — E1121 Type 2 diabetes mellitus with diabetic nephropathy: Secondary | ICD-10-CM | POA: Diagnosis not present

## 2018-03-17 DIAGNOSIS — E1165 Type 2 diabetes mellitus with hyperglycemia: Secondary | ICD-10-CM | POA: Diagnosis not present

## 2018-03-17 DIAGNOSIS — J441 Chronic obstructive pulmonary disease with (acute) exacerbation: Secondary | ICD-10-CM | POA: Diagnosis not present

## 2018-03-17 DIAGNOSIS — F418 Other specified anxiety disorders: Secondary | ICD-10-CM | POA: Diagnosis not present

## 2018-03-17 DIAGNOSIS — M6281 Muscle weakness (generalized): Secondary | ICD-10-CM | POA: Diagnosis not present

## 2018-03-19 DIAGNOSIS — J441 Chronic obstructive pulmonary disease with (acute) exacerbation: Secondary | ICD-10-CM | POA: Diagnosis not present

## 2018-03-19 DIAGNOSIS — J9611 Chronic respiratory failure with hypoxia: Secondary | ICD-10-CM | POA: Diagnosis not present

## 2018-03-19 DIAGNOSIS — E1165 Type 2 diabetes mellitus with hyperglycemia: Secondary | ICD-10-CM | POA: Diagnosis not present

## 2018-03-19 DIAGNOSIS — F418 Other specified anxiety disorders: Secondary | ICD-10-CM | POA: Diagnosis not present

## 2018-03-19 DIAGNOSIS — M6281 Muscle weakness (generalized): Secondary | ICD-10-CM | POA: Diagnosis not present

## 2018-03-19 DIAGNOSIS — E1121 Type 2 diabetes mellitus with diabetic nephropathy: Secondary | ICD-10-CM | POA: Diagnosis not present

## 2018-03-22 DIAGNOSIS — J9611 Chronic respiratory failure with hypoxia: Secondary | ICD-10-CM | POA: Diagnosis not present

## 2018-03-22 DIAGNOSIS — M6281 Muscle weakness (generalized): Secondary | ICD-10-CM | POA: Diagnosis not present

## 2018-03-22 DIAGNOSIS — R918 Other nonspecific abnormal finding of lung field: Secondary | ICD-10-CM | POA: Diagnosis not present

## 2018-03-22 DIAGNOSIS — F418 Other specified anxiety disorders: Secondary | ICD-10-CM | POA: Diagnosis not present

## 2018-03-22 DIAGNOSIS — Z7984 Long term (current) use of oral hypoglycemic drugs: Secondary | ICD-10-CM

## 2018-03-22 DIAGNOSIS — Z9981 Dependence on supplemental oxygen: Secondary | ICD-10-CM

## 2018-03-22 DIAGNOSIS — E1165 Type 2 diabetes mellitus with hyperglycemia: Secondary | ICD-10-CM | POA: Diagnosis not present

## 2018-03-22 DIAGNOSIS — J441 Chronic obstructive pulmonary disease with (acute) exacerbation: Secondary | ICD-10-CM | POA: Diagnosis not present

## 2018-03-22 DIAGNOSIS — E1121 Type 2 diabetes mellitus with diabetic nephropathy: Secondary | ICD-10-CM

## 2018-03-23 ENCOUNTER — Ambulatory Visit (HOSPITAL_COMMUNITY): Payer: Self-pay | Admitting: Psychiatry

## 2018-03-26 ENCOUNTER — Other Ambulatory Visit: Payer: Self-pay | Admitting: Family Medicine

## 2018-03-29 ENCOUNTER — Ambulatory Visit: Payer: Self-pay | Admitting: Family Medicine

## 2018-03-30 ENCOUNTER — Ambulatory Visit: Payer: Self-pay | Admitting: Family Medicine

## 2018-04-04 ENCOUNTER — Observation Stay (HOSPITAL_COMMUNITY)
Admission: EM | Admit: 2018-04-04 | Discharge: 2018-04-06 | Disposition: A | Discharge status: Home / Self Care | Payer: Medicare Other | Attending: Internal Medicine | Admitting: Internal Medicine

## 2018-04-04 DIAGNOSIS — F17210 Nicotine dependence, cigarettes, uncomplicated: Secondary | ICD-10-CM | POA: Diagnosis not present

## 2018-04-04 DIAGNOSIS — F418 Other specified anxiety disorders: Secondary | ICD-10-CM | POA: Insufficient documentation

## 2018-04-04 DIAGNOSIS — I10 Essential (primary) hypertension: Secondary | ICD-10-CM | POA: Diagnosis not present

## 2018-04-04 DIAGNOSIS — E1121 Type 2 diabetes mellitus with diabetic nephropathy: Secondary | ICD-10-CM | POA: Diagnosis present

## 2018-04-04 DIAGNOSIS — Z794 Long term (current) use of insulin: Secondary | ICD-10-CM | POA: Insufficient documentation

## 2018-04-04 DIAGNOSIS — E876 Hypokalemia: Secondary | ICD-10-CM | POA: Diagnosis not present

## 2018-04-04 DIAGNOSIS — I471 Supraventricular tachycardia: Principal | ICD-10-CM | POA: Insufficient documentation

## 2018-04-04 DIAGNOSIS — J449 Chronic obstructive pulmonary disease, unspecified: Secondary | ICD-10-CM | POA: Diagnosis present

## 2018-04-04 DIAGNOSIS — E1165 Type 2 diabetes mellitus with hyperglycemia: Secondary | ICD-10-CM | POA: Diagnosis not present

## 2018-04-04 DIAGNOSIS — I272 Pulmonary hypertension, unspecified: Secondary | ICD-10-CM | POA: Insufficient documentation

## 2018-04-04 DIAGNOSIS — J849 Interstitial pulmonary disease, unspecified: Secondary | ICD-10-CM | POA: Diagnosis not present

## 2018-04-04 DIAGNOSIS — I499 Cardiac arrhythmia, unspecified: Secondary | ICD-10-CM | POA: Diagnosis not present

## 2018-04-04 DIAGNOSIS — Z79899 Other long term (current) drug therapy: Secondary | ICD-10-CM | POA: Insufficient documentation

## 2018-04-04 DIAGNOSIS — G4700 Insomnia, unspecified: Secondary | ICD-10-CM | POA: Insufficient documentation

## 2018-04-04 DIAGNOSIS — R0602 Shortness of breath: Secondary | ICD-10-CM | POA: Diagnosis not present

## 2018-04-04 DIAGNOSIS — J80 Acute respiratory distress syndrome: Secondary | ICD-10-CM | POA: Diagnosis not present

## 2018-04-04 DIAGNOSIS — IMO0002 Reserved for concepts with insufficient information to code with codable children: Secondary | ICD-10-CM | POA: Diagnosis present

## 2018-04-04 DIAGNOSIS — E785 Hyperlipidemia, unspecified: Secondary | ICD-10-CM | POA: Insufficient documentation

## 2018-04-04 DIAGNOSIS — Z66 Do not resuscitate: Secondary | ICD-10-CM | POA: Insufficient documentation

## 2018-04-04 DIAGNOSIS — M6281 Muscle weakness (generalized): Secondary | ICD-10-CM | POA: Diagnosis not present

## 2018-04-04 DIAGNOSIS — J9611 Chronic respiratory failure with hypoxia: Secondary | ICD-10-CM | POA: Diagnosis not present

## 2018-04-04 DIAGNOSIS — N6489 Other specified disorders of breast: Secondary | ICD-10-CM | POA: Diagnosis not present

## 2018-04-04 DIAGNOSIS — K219 Gastro-esophageal reflux disease without esophagitis: Secondary | ICD-10-CM | POA: Insufficient documentation

## 2018-04-04 DIAGNOSIS — R079 Chest pain, unspecified: Secondary | ICD-10-CM | POA: Diagnosis present

## 2018-04-04 DIAGNOSIS — I700 Atherosclerosis of aorta: Secondary | ICD-10-CM | POA: Insufficient documentation

## 2018-04-04 DIAGNOSIS — E039 Hypothyroidism, unspecified: Secondary | ICD-10-CM | POA: Diagnosis not present

## 2018-04-04 DIAGNOSIS — F419 Anxiety disorder, unspecified: Secondary | ICD-10-CM

## 2018-04-04 DIAGNOSIS — R918 Other nonspecific abnormal finding of lung field: Secondary | ICD-10-CM | POA: Diagnosis present

## 2018-04-04 DIAGNOSIS — J441 Chronic obstructive pulmonary disease with (acute) exacerbation: Secondary | ICD-10-CM | POA: Diagnosis not present

## 2018-04-04 DIAGNOSIS — Z7982 Long term (current) use of aspirin: Secondary | ICD-10-CM | POA: Diagnosis not present

## 2018-04-04 DIAGNOSIS — R072 Precordial pain: Secondary | ICD-10-CM | POA: Diagnosis not present

## 2018-04-04 DIAGNOSIS — F329 Major depressive disorder, single episode, unspecified: Secondary | ICD-10-CM | POA: Diagnosis present

## 2018-04-04 DIAGNOSIS — I4891 Unspecified atrial fibrillation: Secondary | ICD-10-CM | POA: Diagnosis not present

## 2018-04-04 NOTE — ED Triage Notes (Signed)
Pt brought in by [facility] for c/o chest pain and sob; pt states the pain is in her chest and radiates to both sides of her neck; pt given asa 324mg  and one nitro with some relief

## 2018-04-05 ENCOUNTER — Other Ambulatory Visit: Payer: Self-pay

## 2018-04-05 ENCOUNTER — Emergency Department (HOSPITAL_COMMUNITY): Payer: Medicare Other

## 2018-04-05 ENCOUNTER — Encounter (HOSPITAL_COMMUNITY): Payer: Self-pay | Admitting: *Deleted

## 2018-04-05 DIAGNOSIS — I471 Supraventricular tachycardia: Secondary | ICD-10-CM | POA: Diagnosis present

## 2018-04-05 DIAGNOSIS — R079 Chest pain, unspecified: Secondary | ICD-10-CM | POA: Diagnosis present

## 2018-04-05 DIAGNOSIS — R0602 Shortness of breath: Secondary | ICD-10-CM | POA: Diagnosis not present

## 2018-04-05 DIAGNOSIS — R918 Other nonspecific abnormal finding of lung field: Secondary | ICD-10-CM | POA: Diagnosis not present

## 2018-04-05 DIAGNOSIS — I10 Essential (primary) hypertension: Secondary | ICD-10-CM | POA: Diagnosis not present

## 2018-04-05 DIAGNOSIS — E1121 Type 2 diabetes mellitus with diabetic nephropathy: Secondary | ICD-10-CM | POA: Diagnosis not present

## 2018-04-05 DIAGNOSIS — J449 Chronic obstructive pulmonary disease, unspecified: Secondary | ICD-10-CM | POA: Diagnosis not present

## 2018-04-05 DIAGNOSIS — E038 Other specified hypothyroidism: Secondary | ICD-10-CM | POA: Diagnosis not present

## 2018-04-05 DIAGNOSIS — F329 Major depressive disorder, single episode, unspecified: Secondary | ICD-10-CM | POA: Diagnosis not present

## 2018-04-05 DIAGNOSIS — E876 Hypokalemia: Secondary | ICD-10-CM | POA: Diagnosis not present

## 2018-04-05 DIAGNOSIS — J9611 Chronic respiratory failure with hypoxia: Secondary | ICD-10-CM | POA: Diagnosis not present

## 2018-04-05 DIAGNOSIS — F419 Anxiety disorder, unspecified: Secondary | ICD-10-CM | POA: Diagnosis not present

## 2018-04-05 DIAGNOSIS — E1165 Type 2 diabetes mellitus with hyperglycemia: Secondary | ICD-10-CM | POA: Diagnosis not present

## 2018-04-05 LAB — CBC WITH DIFFERENTIAL/PLATELET
Abs Immature Granulocytes: 0.09 10*3/uL — ABNORMAL HIGH (ref 0.00–0.07)
Basophils Absolute: 0.1 10*3/uL (ref 0.0–0.1)
Basophils Relative: 1 %
EOS ABS: 0.4 10*3/uL (ref 0.0–0.5)
EOS PCT: 3 %
HCT: 46.7 % — ABNORMAL HIGH (ref 36.0–46.0)
HEMOGLOBIN: 14.5 g/dL (ref 12.0–15.0)
Immature Granulocytes: 1 %
LYMPHS PCT: 21 %
Lymphs Abs: 2.9 10*3/uL (ref 0.7–4.0)
MCH: 29.2 pg (ref 26.0–34.0)
MCHC: 31 g/dL (ref 30.0–36.0)
MCV: 94.2 fL (ref 80.0–100.0)
MONO ABS: 1.1 10*3/uL — AB (ref 0.1–1.0)
Monocytes Relative: 8 %
NRBC: 0 % (ref 0.0–0.2)
Neutro Abs: 9.5 10*3/uL — ABNORMAL HIGH (ref 1.7–7.7)
Neutrophils Relative %: 66 %
Platelets: 299 10*3/uL (ref 150–400)
RBC: 4.96 MIL/uL (ref 3.87–5.11)
RDW: 13.4 % (ref 11.5–15.5)
WBC: 14 10*3/uL — AB (ref 4.0–10.5)

## 2018-04-05 LAB — I-STAT TROPONIN, ED: Troponin i, poc: 0.06 ng/mL (ref 0.00–0.08)

## 2018-04-05 LAB — BASIC METABOLIC PANEL
Anion gap: 10 (ref 5–15)
Anion gap: 10 (ref 5–15)
BUN: 12 mg/dL (ref 8–23)
BUN: 12 mg/dL (ref 8–23)
CHLORIDE: 103 mmol/L (ref 98–111)
CO2: 26 mmol/L (ref 22–32)
CO2: 29 mmol/L (ref 22–32)
CREATININE: 1 mg/dL (ref 0.44–1.00)
CREATININE: 1 mg/dL (ref 0.44–1.00)
Calcium: 8.5 mg/dL — ABNORMAL LOW (ref 8.9–10.3)
Calcium: 8.6 mg/dL — ABNORMAL LOW (ref 8.9–10.3)
Chloride: 102 mmol/L (ref 98–111)
GFR calc Af Amer: >60 mL/min (ref >60)
GFR calc non Af Amer: 58 mL/min — ABNORMAL LOW (ref >60)
GFR calc non Af Amer: 58 mL/min — ABNORMAL LOW (ref >60)
Glucose, Bld: 297 mg/dL — ABNORMAL HIGH (ref 70–99)
Glucose, Bld: 271 mg/dL — ABNORMAL HIGH (ref 70–99)
Potassium: 3.3 mmol/L — ABNORMAL LOW (ref 3.5–5.1)
Potassium: 3.4 mmol/L — ABNORMAL LOW (ref 3.5–5.1)
SODIUM: 139 mmol/L (ref 135–145)
SODIUM: 141 mmol/L (ref 135–145)

## 2018-04-05 LAB — GLUCOSE, CAPILLARY
GLUCOSE-CAPILLARY: 235 mg/dL — AB (ref 70–99)
Glucose-Capillary: 217 mg/dL — ABNORMAL HIGH (ref 70–99)
Glucose-Capillary: 269 mg/dL — ABNORMAL HIGH (ref 70–99)
Glucose-Capillary: 148 mg/dL — ABNORMAL HIGH (ref 70–99)

## 2018-04-05 LAB — TROPONIN I
TROPONIN I: 0.09 ng/mL — AB (ref <0.03)
TROPONIN I: 0.07 ng/mL — AB (ref <0.03)

## 2018-04-05 LAB — BRAIN NATRIURETIC PEPTIDE: B NATRIURETIC PEPTIDE 5: 251 pg/mL — AB (ref 0.0–100.0)

## 2018-04-05 LAB — MRSA PCR SCREENING: MRSA by PCR: NEGATIVE

## 2018-04-05 LAB — MAGNESIUM: MAGNESIUM: 1.7 mg/dL (ref 1.7–2.4)

## 2018-04-05 MED ORDER — MONTELUKAST SODIUM 10 MG PO TABS
10.0000 mg | ORAL_TABLET | Freq: Every day | ORAL | Status: DC
  Administered 2018-04-05: 10 mg via ORAL
  Filled 2018-04-05: qty 1

## 2018-04-05 MED ORDER — SODIUM CHLORIDE 0.9 % IV SOLN
250.0000 mL | INTRAVENOUS | Status: DC | PRN
  Administered 2018-04-05: 250 mL via INTRAVENOUS

## 2018-04-05 MED ORDER — MOMETASONE FURO-FORMOTEROL FUM 200-5 MCG/ACT IN AERO
2.0000 | INHALATION_SPRAY | Freq: Two times a day (BID) | RESPIRATORY_TRACT | Status: DC
  Administered 2018-04-05 – 2018-04-06 (×3): 2 via RESPIRATORY_TRACT
  Filled 2018-04-05: qty 8.8

## 2018-04-05 MED ORDER — ACETAMINOPHEN 650 MG RE SUPP: 650.0000 mg | Freq: Four times a day (QID) | RECTAL | Status: DC | PRN

## 2018-04-05 MED ORDER — SODIUM CHLORIDE 0.9% FLUSH: 3.0000 mL | INTRAVENOUS | Status: DC | PRN

## 2018-04-05 MED ORDER — LISINOPRIL 5 MG PO TABS
5.0000 mg | ORAL_TABLET | Freq: Every day | ORAL | Status: DC
  Administered 2018-04-05 – 2018-04-06 (×2): 5 mg via ORAL
  Filled 2018-04-05 (×2): qty 1

## 2018-04-05 MED ORDER — FLUOXETINE HCL 20 MG PO CAPS
40.0000 mg | ORAL_CAPSULE | Freq: Every day | ORAL | Status: DC
  Administered 2018-04-05 – 2018-04-06 (×2): 40 mg via ORAL
  Filled 2018-04-05 (×2): qty 2

## 2018-04-05 MED ORDER — ASPIRIN EC 81 MG PO TBEC
81.0000 mg | DELAYED_RELEASE_TABLET | Freq: Every day | ORAL | Status: DC
  Administered 2018-04-05 – 2018-04-06 (×2): 81 mg via ORAL
  Filled 2018-04-05 (×2): qty 1

## 2018-04-05 MED ORDER — FOLIC ACID 1 MG PO TABS
500.0000 ug | ORAL_TABLET | Freq: Every day | ORAL | Status: DC
  Administered 2018-04-05 – 2018-04-06 (×2): 0.5 mg via ORAL
  Filled 2018-04-05 (×2): qty 1

## 2018-04-05 MED ORDER — PANTOPRAZOLE SODIUM 40 MG PO TBEC
40.0000 mg | DELAYED_RELEASE_TABLET | Freq: Every day | ORAL | Status: DC
  Administered 2018-04-05 – 2018-04-06 (×2): 40 mg via ORAL
  Filled 2018-04-05 (×2): qty 1

## 2018-04-05 MED ORDER — MORPHINE SULFATE (PF) 2 MG/ML IV SOLN: 1.0000 mg | INTRAVENOUS | Status: DC | PRN

## 2018-04-05 MED ORDER — GUAIFENESIN ER 600 MG PO TB12
600.0000 mg | ORAL_TABLET | Freq: Two times a day (BID) | ORAL | Status: DC
  Administered 2018-04-05 – 2018-04-06 (×3): 600 mg via ORAL
  Filled 2018-04-05 (×3): qty 1

## 2018-04-05 MED ORDER — ARIPIPRAZOLE 2 MG PO TABS
2.0000 mg | ORAL_TABLET | Freq: Every day | ORAL | Status: DC
  Administered 2018-04-05 – 2018-04-06 (×2): 2 mg via ORAL
  Filled 2018-04-05 (×3): qty 1

## 2018-04-05 MED ORDER — MAGNESIUM SULFATE IN D5W 1-5 GM/100ML-% IV SOLN
1.0000 g | Freq: Once | INTRAVENOUS | Status: AC
  Administered 2018-04-05: 1 g via INTRAVENOUS
  Filled 2018-04-05: qty 100

## 2018-04-05 MED ORDER — ADENOSINE 6 MG/2ML IV SOLN
INTRAVENOUS | Status: AC
  Filled 2018-04-05: qty 2

## 2018-04-05 MED ORDER — ONDANSETRON HCL 4 MG PO TABS: 4.0000 mg | ORAL_TABLET | Freq: Four times a day (QID) | ORAL | Status: DC | PRN

## 2018-04-05 MED ORDER — METOPROLOL TARTRATE 25 MG PO TABS
25.0000 mg | ORAL_TABLET | Freq: Two times a day (BID) | ORAL | Status: DC
  Administered 2018-04-05 – 2018-04-06 (×3): 25 mg via ORAL
  Filled 2018-04-05 (×3): qty 1

## 2018-04-05 MED ORDER — SENNOSIDES-DOCUSATE SODIUM 8.6-50 MG PO TABS: 1.0000 | ORAL_TABLET | Freq: Every evening | ORAL | Status: DC | PRN

## 2018-04-05 MED ORDER — ORAL CARE MOUTH RINSE
15.0000 mL | Freq: Two times a day (BID) | OROMUCOSAL | Status: DC
  Administered 2018-04-05 – 2018-04-06 (×3): 15 mL via OROMUCOSAL

## 2018-04-05 MED ORDER — METOPROLOL TARTRATE 5 MG/5ML IV SOLN
5.0000 mg | INTRAVENOUS | Status: DC | PRN
  Administered 2018-04-05: 5 mg via INTRAVENOUS
  Filled 2018-04-05: qty 5

## 2018-04-05 MED ORDER — UMECLIDINIUM BROMIDE 62.5 MCG/INH IN AEPB
1.0000 | INHALATION_SPRAY | Freq: Every day | RESPIRATORY_TRACT | Status: DC
  Administered 2018-04-05 – 2018-04-06 (×2): 1 via RESPIRATORY_TRACT
  Filled 2018-04-05: qty 7

## 2018-04-05 MED ORDER — ALPRAZOLAM 0.5 MG PO TABS
1.0000 mg | ORAL_TABLET | Freq: Four times a day (QID) | ORAL | Status: DC
  Administered 2018-04-05 – 2018-04-06 (×7): 1 mg via ORAL
  Filled 2018-04-05 (×7): qty 2

## 2018-04-05 MED ORDER — ALBUTEROL SULFATE (2.5 MG/3ML) 0.083% IN NEBU: 2.5000 mg | INHALATION_SOLUTION | Freq: Four times a day (QID) | RESPIRATORY_TRACT | Status: DC | PRN

## 2018-04-05 MED ORDER — INSULIN ASPART 100 UNIT/ML ~~LOC~~ SOLN: 0.0000 [IU] | Freq: Every day | SUBCUTANEOUS | Status: DC

## 2018-04-05 MED ORDER — ROSUVASTATIN CALCIUM 20 MG PO TABS
20.0000 mg | ORAL_TABLET | Freq: Every day | ORAL | Status: DC
  Administered 2018-04-05 – 2018-04-06 (×2): 20 mg via ORAL
  Filled 2018-04-05 (×2): qty 1

## 2018-04-05 MED ORDER — SODIUM CHLORIDE 0.9% FLUSH
3.0000 mL | Freq: Two times a day (BID) | INTRAVENOUS | Status: DC
  Administered 2018-04-05 – 2018-04-06 (×3): 3 mL via INTRAVENOUS

## 2018-04-05 MED ORDER — LORAZEPAM 2 MG/ML IJ SOLN
1.0000 mg | Freq: Once | INTRAMUSCULAR | Status: AC
  Administered 2018-04-05: 1 mg via INTRAVENOUS
  Filled 2018-04-05: qty 1

## 2018-04-05 MED ORDER — ACETAMINOPHEN 325 MG PO TABS: 650.0000 mg | ORAL_TABLET | Freq: Four times a day (QID) | ORAL | Status: DC | PRN

## 2018-04-05 MED ORDER — ONDANSETRON HCL 4 MG/2ML IJ SOLN: 4.0000 mg | Freq: Four times a day (QID) | INTRAMUSCULAR | Status: DC | PRN

## 2018-04-05 MED ORDER — ENOXAPARIN SODIUM 40 MG/0.4ML ~~LOC~~ SOLN
40.0000 mg | SUBCUTANEOUS | Status: DC
  Administered 2018-04-05 – 2018-04-06 (×2): 40 mg via SUBCUTANEOUS
  Filled 2018-04-05 (×2): qty 0.4

## 2018-04-05 MED ORDER — POTASSIUM CHLORIDE CRYS ER 20 MEQ PO TBCR
20.0000 meq | EXTENDED_RELEASE_TABLET | Freq: Once | ORAL | Status: AC
  Administered 2018-04-05: 20 meq via ORAL
  Filled 2018-04-05: qty 1

## 2018-04-05 MED ORDER — ZOLPIDEM TARTRATE 5 MG PO TABS
5.0000 mg | ORAL_TABLET | Freq: Every day | ORAL | Status: DC
  Administered 2018-04-05: 5 mg via ORAL
  Filled 2018-04-05: qty 1

## 2018-04-05 MED ORDER — ADENOSINE 6 MG/2ML IV SOLN
6.0000 mg | Freq: Once | INTRAVENOUS | Status: AC
  Administered 2018-04-05: 6 mg via INTRAVENOUS

## 2018-04-05 MED ORDER — POTASSIUM CHLORIDE 10 MEQ/100ML IV SOLN
10.0000 meq | INTRAVENOUS | Status: AC
  Administered 2018-04-05 (×2): 10 meq via INTRAVENOUS
  Filled 2018-04-05 (×2): qty 100

## 2018-04-05 MED ORDER — LEVOTHYROXINE SODIUM 100 MCG PO TABS
100.0000 ug | ORAL_TABLET | Freq: Every day | ORAL | Status: DC
  Administered 2018-04-05 – 2018-04-06 (×2): 100 ug via ORAL
  Filled 2018-04-05 (×2): qty 1

## 2018-04-05 MED ORDER — GABAPENTIN 300 MG PO CAPS
300.0000 mg | ORAL_CAPSULE | Freq: Every day | ORAL | Status: DC
  Administered 2018-04-05: 300 mg via ORAL
  Filled 2018-04-05: qty 1

## 2018-04-05 MED ORDER — SODIUM CHLORIDE 0.9 % IV BOLUS
500.0000 mL | Freq: Once | INTRAVENOUS | Status: AC
  Administered 2018-04-05: 500 mL via INTRAVENOUS

## 2018-04-05 MED ORDER — ENSURE ENLIVE PO LIQD
237.0000 mL | Freq: Two times a day (BID) | ORAL | Status: DC
  Administered 2018-04-05: 237 mL via ORAL

## 2018-04-05 MED ORDER — FLUOXETINE HCL 20 MG PO CAPS
20.0000 mg | ORAL_CAPSULE | Freq: Every day | ORAL | Status: DC
  Administered 2018-04-05: 20 mg via ORAL
  Filled 2018-04-05: qty 1

## 2018-04-05 MED ORDER — GLUCERNA SHAKE PO LIQD
237.0000 mL | Freq: Three times a day (TID) | ORAL | Status: DC
  Administered 2018-04-05 – 2018-04-06 (×4): 237 mL via ORAL

## 2018-04-05 MED ORDER — OXYBUTYNIN CHLORIDE 5 MG PO TABS
5.0000 mg | ORAL_TABLET | Freq: Two times a day (BID) | ORAL | Status: DC
  Administered 2018-04-05 – 2018-04-06 (×2): 5 mg via ORAL
  Filled 2018-04-05 (×2): qty 1

## 2018-04-05 MED ORDER — INSULIN ASPART 100 UNIT/ML ~~LOC~~ SOLN
0.0000 [IU] | Freq: Three times a day (TID) | SUBCUTANEOUS | Status: DC
  Administered 2018-04-05: 5 [IU] via SUBCUTANEOUS
  Administered 2018-04-05: 8 [IU] via SUBCUTANEOUS
  Administered 2018-04-05: 5 [IU] via SUBCUTANEOUS
  Administered 2018-04-06: 15 [IU] via SUBCUTANEOUS
  Administered 2018-04-06: 3 [IU] via SUBCUTANEOUS
  Administered 2018-04-06: 5 [IU] via SUBCUTANEOUS

## 2018-04-05 MED ORDER — POTASSIUM CHLORIDE CRYS ER 20 MEQ PO TBCR
40.0000 meq | EXTENDED_RELEASE_TABLET | Freq: Once | ORAL | Status: AC
  Administered 2018-04-05: 40 meq via ORAL
  Filled 2018-04-05: qty 2

## 2018-04-05 MED ORDER — MAGNESIUM SULFATE 2 GM/50ML IV SOLN
2.0000 g | Freq: Once | INTRAVENOUS | Status: AC
  Administered 2018-04-05: 2 g via INTRAVENOUS
  Filled 2018-04-05: qty 50

## 2018-04-05 MED ORDER — SODIUM CHLORIDE 0.9% FLUSH: 3.0000 mL | Freq: Two times a day (BID) | INTRAVENOUS | Status: DC

## 2018-04-05 MED ORDER — LEVALBUTEROL TARTRATE 45 MCG/ACT IN AERO: 1.0000 | INHALATION_SPRAY | Freq: Four times a day (QID) | RESPIRATORY_TRACT | Status: DC | PRN

## 2018-04-05 NOTE — ED Provider Notes (Signed)
[facility] EMERGENCY DEPARTMENT Provider Note   CSN: [id number] Arrival date & time: 04/04/18  2353     History   Chief Complaint Chief Complaint  Patient presents with  . Chest Pain    HPI [name] is a 67 y.o. female.  Patient presents to the emergency department with complaints of chest pain and shortness of breath.  Patient reports that she has had 2 episodes of pain today.  The first one was earlier today, she called her home health nurse.  By the time the nurse arrived, pain had resolved.  She was told to come to the ER if it recurred.  Patient reports onset of substernal chest pain with some radiation to the neck tonight.  She is brought to the ER by ambulance.  They noticed tachypnea and hypoxia.  Patient is normally on 2 L nasal cannula, has been moderately hypoxic during transport.  She was given 1 sublingual nitroglycerin with some improvement of her pain.  Patient reports that she has been having the shortness of breath for at least several days.  She has had a cough but has not noticed any fever.     Past Medical History:  Diagnosis Date  . Anxiety   . COPD (chronic obstructive pulmonary disease) ([place])   . Depression 2008  . Diabetes mellitus, type 1 1983   type 2 diabetes  . DJD (degenerative joint disease)    of the spine   . GERD (gastroesophageal reflux disease)   . Hyperlipidemia 2007  . Hypertension 1983  . Hypothyroidism 2004   left thyroidectomy  . Insomnia   . Obesity     Patient Active Problem List   Diagnosis Date Noted  . COPD (chronic obstructive pulmonary disease) ([place]) 02/19/2018  . Mass of upper lobe of right lung   . Goals of care, counseling/discussion   . Palliative care by specialist   . Encounter for hospice care discussion   . COPD exacerbation ([place]) 02/18/2018  . Hypokalemia 02/18/2018  . Lung mass- suspected Lung Cancer 02/18/2018  . Screening for depression 11/29/2017  . Non compliance with medical treatment 08/30/2017   . Abnormal CT scan, chest 11/03/2016  . Abnormal brain scan 08/10/2016  . COPD with acute exacerbation ([place]) 07/18/2016  . Weakness generalized 07/18/2016  . At high risk for injury related to fall 05/29/2016  . Unsteady gait 05/29/2016  . Anemia, deficiency 01/01/2016  . Back spasm 10/01/2015  . DNR no code (do not resuscitate) 06/18/2015  . Excessive daytime sleepiness 12/30/2014  . Abnormal mammogram 12/30/2014  . Chronic respiratory failure with hypoxia ([place]) 12/15/2013  . Hearing loss 109/09/2012  . Oxygen dependent 11/19/2012  . Carotid bruit 04/12/2012  . Uncontrolled diabetes mellitus with diabetic nephropathy ([place]) 08/04/2011  . FATIGUE 02/20/2009  . NICOTINE ADDICTION 12/31/2008  . ALLERGIC RHINITIS CAUSE UNSPECIFIED 10/07/2008  . Essential hypertension 03/07/2008  . UNSPECIFIED PERIPHERAL VASCULAR DISEASE 03/06/2008  . Hypothyroidism 09/08/2007  . Hyperlipidemia LDL goal <100 09/08/2007  . Morbid obesity ([place]) 09/08/2007  . Anxiety and depression 09/08/2007  . COPD, severe ([place]) 09/08/2007  . GERD 09/08/2007  . Osteoarthritis of spine 09/08/2007  . Insomnia 09/08/2007    Past Surgical History:  Procedure Laterality Date  . APPENDECTOMY    . bilatal great toenail removed , complicated by MRSA in rigt toenail  May 2011  . BREAST EXCISIONAL BIOPSY Right    20+ yrs ago  . TONSILLECTOMY  childhood   . TOTAL ABDOMINAL HYSTERECTOMY W/ BILATERAL  SALPINGOOPHORECTOMY    . tracheotomy secondary to failed attempt and lung biopsy       OB History   None      Home Medications    Prior to Admission medications   Medication Sig Start Date End Date Taking? Authorizing Provider  acetaminophen (TYLENOL) 325 MG tablet Take 650 mg by mouth every 6 (six) hours as needed.    [provider]  ALPRAZolam Duanne Moron) 1 MG tablet TAKE 1 TABLET BY MOUTH FOUR TIMES DAILY 12/21/17   Cloria Spring, MD  ARIPiprazole (ABILIFY) 2 MG tablet Take 1 tablet (2 mg total) by mouth  daily. 12/21/17   Cloria Spring, MD  aspirin (ASPIRIN LOW DOSE) 81 MG EC tablet Take 81 mg by mouth daily.      [provider]  calcium carbonate (TUMS) 500 MG chewable tablet Chew 1 tablet by mouth as needed.      [provider]  cloNIDine (CATAPRES) 0.2 MG tablet One tablet at bedtime for blood pressure 08/18/17   Fayrene Helper, MD  clotrimazole-betamethasone (LOTRISONE) cream APPLY TO THE AFFECTED AREA TWICE DAILY 12/01/17   Fayrene Helper, MD  esomeprazole (NEXIUM) 40 MG capsule TAKE 1 CAPSULE BY MOUTH EVERY MORNING 02/15/18   Fayrene Helper, MD  ferrous sulfate 325 (65 FE) MG tablet Take 325 mg by mouth every other day.     [provider]  FLUoxetine (PROZAC) 20 MG capsule Take two in the am and one in the evening 12/21/17   Cloria Spring, MD  Fluticasone-Salmeterol (ADVAIR DISKUS) 250-50 MCG/DOSE AEPB inhale 1 dose by mouth twice a day 04/09/15   Fayrene Helper, MD  folic acid (FOLVITE) 161 MCG tablet Take 400 mcg by mouth daily.    [provider]  furosemide (LASIX) 40 MG tablet TAKE 1 TABLET BY MOUTH DAILY 12/30/17   Fayrene Helper, MD  gabapentin (NEURONTIN) 300 MG capsule TAKE ONE CAPSULE BY MOUTH AT BEDTIME 02/15/18   Fayrene Helper, MD  glipiZIDE (GLUCOTROL XL) 10 MG 24 hr tablet Take 1 tablet (10 mg total) by mouth daily with breakfast. Dose reduction effective 03/19/20219 02/10/18   Fayrene Helper, MD  glipiZIDE (GLUCOTROL XL) 2.5 MG 24 hr tablet Take 1 tablet (2.5 mg total) by mouth daily with breakfast. 11/24/17   Fayrene Helper, MD  guaiFENesin (MUCINEX) 600 MG 12 hr tablet Take 1 tablet (600 mg total) by mouth 2 (two) times daily. 02/20/18 02/20/19  Kathie Dike, MD  Insulin Pen Needle 31G X 5 MM MISC Use once daily to inject insulin dx E11.65 08/12/16   Fayrene Helper, MD  ipratropium-albuterol (DUONEB) 0.5-2.5 (3) MG/3ML SOLN Take 3 mLs by nebulization 4 (four) times daily as needed.     [provider]  levalbuterol Penne Lash HFA) 45 MCG/ACT inhaler Inhale 1-2 puffs into the lungs as needed.      [provider]  levothyroxine (SYNTHROID, LEVOTHROID) 100 MCG tablet TAKE 1 TABLET ONCE DAILY 03/29/18   Fayrene Helper, MD  lisinopril (PRINIVIL,ZESTRIL) 5 MG tablet Take 1 tablet (5 mg total) by mouth daily. 02/10/18   Fayrene Helper, MD  montelukast (SINGULAIR) 10 MG tablet TAKE 1 TABLET BY MOUTH AT BEDTIME 10/19/17   Fayrene Helper, MD  predniSONE (DELTASONE) 10 MG tablet Take 40mg  po daily for 3 days then 30mg  daily for 3 days then 20mg  daily for 3 days then 10mg  daily for 3 days then stop 02/20/18  Kathie Dike, MD  rosuvastatin (CRESTOR) 20 MG tablet TAKE 1 TABLET ONCE DAILY 02/15/18   Fayrene Helper, MD  Vitamin D, Ergocalciferol, (DRISDOL) 50000 units CAPS capsule TAKE 1 CAPSULE BY MOUTH ONCE WEEKLY 12/30/17   Fayrene Helper, MD  zolpidem (AMBIEN) 10 MG tablet TAKE 1 TABLET(10 MG) BY MOUTH AT BEDTIME 12/21/17   Cloria Spring, MD    Family History Family History  Problem Relation Age of Onset  . Hypertension Mother   . Stroke Mother   . Cancer Father        lung   . Hypertension Sister   . Depression Sister   . Alcohol abuse Sister   . Hypertension Sister   . Depression Sister   . Cancer Sister   . Diabetes Sister   . Depression Sister   . Cancer Sister        renal   . Kidney failure Brother   . Kidney failure Unknown        family history   . Hypertension Unknown        family history     Social History Social History   Tobacco Use  . Smoking status: Current Every Day Smoker    Packs/day: 0.50    Years: 44.00    Pack years: 22.00    Types: Cigarettes  . Smokeless tobacco: Never Used  . Tobacco comment: current 15/ day, would like to smoke 7/ day  Substance Use Topics  . Alcohol use: No  . Drug use: No     Allergies   Codeine   Review of Systems Review of Systems  Respiratory: Positive for shortness of  breath.   Cardiovascular: Positive for chest pain.  All other systems reviewed and are negative.    Physical Exam Updated Vital Signs BP (!) 133/99   Pulse (!) 121   Resp (!) 25   SpO2 94%   Physical Exam  Constitutional: She is oriented to person, place, and time. She appears well-developed and well-nourished. No distress.  HENT:  Head: Normocephalic and atraumatic.  Right Ear: Hearing normal.  Left Ear: Hearing normal.  Nose: Nose normal.  Mouth/Throat: Oropharynx is clear and moist and mucous membranes are normal.  Eyes: Pupils are equal, round, and reactive to light. Conjunctivae and EOM are normal.  Neck: Normal range of motion. Neck supple.  Cardiovascular: Regular rhythm, S1 normal and S2 normal. Exam reveals no gallop and no friction rub.  No murmur heard. Pulmonary/Chest: Effort normal. Tachypnea noted. No respiratory distress. She has decreased breath sounds. She has wheezes. She exhibits no tenderness.  Abdominal: Soft. Normal appearance and bowel sounds are normal. There is no hepatosplenomegaly. There is no tenderness. There is no rebound, no guarding, no tenderness at McBurney's point and negative Murphy's sign. No hernia.  Musculoskeletal: Normal range of motion.  Neurological: She is alert and oriented to person, place, and time. She has normal strength. No cranial nerve deficit or sensory deficit. Coordination normal. GCS eye subscore is 4. GCS verbal subscore is 5. GCS motor subscore is 6.  Skin: Skin is warm, dry and intact. No rash noted. No cyanosis.  Psychiatric: She has a normal mood and affect. Her speech is normal and behavior is normal. Thought content normal.  Nursing note and vitals reviewed.    ED Treatments / Results  Labs (all labs ordered are listed, but only abnormal results are displayed) Labs Reviewed  CBC WITH DIFFERENTIAL/PLATELET - Abnormal; Notable for the following components:  Result Value   WBC 14.0 (*)    HCT 46.7 (*)    Neutro  Abs 9.5 (*)    Monocytes Absolute 1.1 (*)    Abs Immature Granulocytes 0.09 (*)    All other components within normal limits  BASIC METABOLIC PANEL - Abnormal; Notable for the following components:   Potassium 3.3 (*)    Glucose, Bld 297 (*)    Calcium 8.5 (*)    GFR calc non Af Amer 58 (*)    All other components within normal limits  BRAIN NATRIURETIC PEPTIDE - Abnormal; Notable for the following components:   B Natriuretic Peptide 251.0 (*)    All other components within normal limits  I-STAT TROPONIN, ED    EKG EKG Interpretation  Date/Time:  Monday April 05 2018 00:44:56 EST Ventricular Rate:  99 PR Interval:  168 QRS Duration: 90 QT Interval:  396 QTC Calculation: 508 R Axis:   158 Text Interpretation:  Sinus rhythm with marked sinus arrhythmia Right atrial enlargement Right axis deviation Pulmonary disease pattern Right ventricular hypertrophy Prolonged QT Abnormal ECG No significant change since last tracing Confirmed by Orpah Greek [id number]) on 04/05/2018 12:58:41 AM   Radiology Dg Chest 2 View  Result Date: 04/05/2018 CLINICAL DATA:  Chest pain and shortness of breath. EXAM: CHEST - 2 VIEW COMPARISON:  Chest radiograph and CT 02/18/2018 FINDINGS: The cardiac silhouette is mildly enlarged. Aortic atherosclerosis is noted. Persistent right hilar enlargement corresponds to lymphadenopathy on CT. There is chronic interstitial lung disease which is greatest in the right upper lobe and left lower lung. Patchy right upper lobe opacity is similar to the prior radiograph, and there is a posterior right upper lobe mass as seen on CT. Compared to the prior radiograph, no definite acute airspace consolidation is seen. No pleural effusion or pneumothorax is identified. IMPRESSION: Chronic lung disease, right upper lobe mass, and right hilar lymphadenopathy without definite acute abnormality. Electronically Signed   By: Logan Bores M.D.   On: 04/05/2018 01:46     Procedures .Cardioversion Date/Time: 04/05/2018 2:54 AM Performed by: Orpah Greek, MD Authorized by: Orpah Greek, MD   Consent:    Consent obtained:  Verbal   Consent given by:  Patient Pre-procedure details:    Cardioversion basis:  Emergent   Rhythm:  Supraventricular tachycardia Attempt one:    Cardioversion mode attempt one: adenosine 76mp IVP.   Shock outcome:  Conversion to normal sinus rhythm Post-procedure details:    Patient status:  Awake   Patient tolerance of procedure:  Tolerated well, no immediate complications   (including critical care time)  Medications Ordered in ED Medications  adenosine (ADENOCARD) 6 MG/2ML injection 6 mg (6 mg Intravenous Given 04/05/18 0212)     Initial Impression / Assessment and Plan / ED Course  I have reviewed the triage vital signs and the nursing notes.  Pertinent labs & imaging results that were available during my care of the patient were reviewed by me and considered in my medical decision making (see chart for details).     Patient presents to the emergency department for evaluation of shortness of breath and chest pain.  Patient reports cough, congestion and increasing shortness of breath for several days.  She developed chest pain tonight.  Upon arrival to the ER she did have fairly significant bronchospasm present.  She does have a history of severe COPD.  EKG does not show obvious changes that would suggest ischemia or infarct.  Does not  appear to be volume overloaded at this time.  Patient sent to radiology for chest x-ray.  This x-ray ultimately did not show any acute pathology.  When she came back from radiology, however, she reported increased shortness of breath.  She was placed back on the monitor and seemed to be in a narrow complex sinus tachycardia at 180 bpm.  She was administered a single dose of adenosine which broke her into a sinus tachycardia.  Based on this, she was not administered any  further bronchodilator therapy.  She will require hospitalization for further management.  CRITICAL CARE Performed by: Orpah Greek   Total critical care time: 35 minutes  Critical care time was exclusive of separately billable procedures and treating other patients.  Critical care was necessary to treat or prevent imminent or life-threatening deterioration.  Critical care was time spent personally by me on the following activities: development of treatment plan with patient and/or surrogate as well as nursing, discussions with consultants, evaluation of patient's response to treatment, examination of patient, obtaining history from patient or surrogate, ordering and performing treatments and interventions, ordering and review of laboratory studies, ordering and review of radiographic studies, pulse oximetry and re-evaluation of patient's condition.   Final Clinical Impressions(s) / ED Diagnoses   Final diagnoses:  Chest pain, unspecified type  SVT (supraventricular tachycardia) ([place])  Chronic obstructive pulmonary disease with acute exacerbation [facility])    ED Discharge Orders    None       Betsey Holiday, Gwenyth Allegra, MD 04/05/18 [phone]

## 2018-04-05 NOTE — Progress Notes (Signed)
Pts BP 186/103- Dr. Myna Hidalgo paged and made aware. Pt takes Clonidine and Lisinopril at home- did not take Sunday night and Clonidine currently on hold d/t arrhythmia.  Lisinopril continued but not scheduled to be given until 10am. RN asked Dr.Opyd for Prn BP medications. Waiting for call back/orders.

## 2018-04-05 NOTE — H&P (Signed)
History and Physical    [name] HGD:[id number] DOB: [birth year] DOA: 04/04/2018  PCP: Fayrene Helper, MD   Patient coming from: Home   Chief Complaint: Chest pain, SOB   HPI: [name] is a 67 y.o. female with medical history significant for COPD with chronic hypoxic respiratory failure, type 2 diabetes mellitus, depression with anxiety, hypertension, and right lung mass for which she desires a palliative approach, now presenting to the emergency department for evaluation of 3 to 4 days of intermittent chest pain and dyspnea.  Patient reports that she recently recovered from a COPD exacerbation that was treated with prednisone taper and doxycycline, was back to her usual state, and then developed chest pain 3 or 4 days ago.  Pain has been intermittent, associated with dyspnea, radiating into the neck, sometimes triggered by activity though not necessarily relieved with rest.  Episodes have lasted several minutes to an hour.  She reports palpitations associated with this.  She has increased shortness of breath during these episodes but reports that her chronic cough seems to be stable with occasional production of clear sputum.  Denies fevers or chills.  Denies any lower extremity swelling or tenderness.  She called EMS and was treated with 324 mg of aspirin and 1 nitroglycerin prior to arrival in the ED.  ED Course: Upon arrival to the ED, patient is found to be saturating low 90s on her usual 2 L/min of supplemental oxygen, mildly tachypneic, and with initial heart rate ~100.  Initial EKG features a sinus rhythm with marked sinus arrhythmia, RAD, and RVH.  Patient developed recurrent episode of chest pain in the ED and was noted to be in SVT with rate 181.  She was given 6 mg IV adenosine with return to sinus rhythm, rate 90-100.  Chemistry panel is notable for glucose of 297 and potassium 3.3.  CBC features a leukocytosis to 14,000.  Troponin is normal and BNP is elevated to 251.   Blood pressure has remained stable, heart rate is now in the 90-100 range after adenosine, and the patient will be observed in the stepdown unit for ongoing evaluation and management of intermittent chest pain suspected secondary to paroxysmal SVT.  Review of Systems:  All other systems reviewed and apart from HPI, are negative.  Past Medical History:  Diagnosis Date  . Anxiety   . COPD (chronic obstructive pulmonary disease) ([place])   . Depression 2008  . Diabetes mellitus, type 1 1983   type 2 diabetes  . DJD (degenerative joint disease)    of the spine   . GERD (gastroesophageal reflux disease)   . Hyperlipidemia 2007  . Hypertension 1983  . Hypothyroidism 2004   left thyroidectomy  . Insomnia   . Obesity     Past Surgical History:  Procedure Laterality Date  . APPENDECTOMY    . bilatal great toenail removed , complicated by MRSA in rigt toenail  May 2011  . BREAST EXCISIONAL BIOPSY Right    20+ yrs ago  . TONSILLECTOMY  childhood   . TOTAL ABDOMINAL HYSTERECTOMY W/ BILATERAL SALPINGOOPHORECTOMY    . tracheotomy secondary to failed attempt and lung biopsy       reports that she has been smoking cigarettes. She has a 22.00 pack-year smoking history. She has never used smokeless tobacco. She reports that she does not drink alcohol or use drugs.  No Active Allergies  Family History  Problem Relation Age of Onset  . Hypertension Mother   . Stroke  Mother   . Cancer Father        lung   . Hypertension Sister   . Depression Sister   . Alcohol abuse Sister   . Hypertension Sister   . Depression Sister   . Cancer Sister   . Diabetes Sister   . Depression Sister   . Cancer Sister        renal   . Kidney failure Brother   . Kidney failure Unknown        family history   . Hypertension Unknown        family history      Prior to Admission medications   Medication Sig Start Date End Date Taking? Authorizing Provider  acetaminophen (TYLENOL) 325 MG tablet Take 650  mg by mouth every 6 (six) hours as needed.    [provider]  ALPRAZolam Duanne Moron) 1 MG tablet TAKE 1 TABLET BY MOUTH FOUR TIMES DAILY 12/21/17   Cloria Spring, MD  ARIPiprazole (ABILIFY) 2 MG tablet Take 1 tablet (2 mg total) by mouth daily. 12/21/17   Cloria Spring, MD  aspirin (ASPIRIN LOW DOSE) 81 MG EC tablet Take 81 mg by mouth daily.      [provider]  calcium carbonate (TUMS) 500 MG chewable tablet Chew 1 tablet by mouth as needed.      [provider]  cloNIDine (CATAPRES) 0.2 MG tablet One tablet at bedtime for blood pressure 08/18/17   Fayrene Helper, MD  clotrimazole-betamethasone (LOTRISONE) cream APPLY TO THE AFFECTED AREA TWICE DAILY 12/01/17   Fayrene Helper, MD  esomeprazole (NEXIUM) 40 MG capsule TAKE 1 CAPSULE BY MOUTH EVERY MORNING 02/15/18   Fayrene Helper, MD  ferrous sulfate 325 (65 FE) MG tablet Take 325 mg by mouth every other day.     [provider]  FLUoxetine (PROZAC) 20 MG capsule Take two in the am and one in the evening 12/21/17   Cloria Spring, MD  Fluticasone-Salmeterol (ADVAIR DISKUS) 250-50 MCG/DOSE AEPB inhale 1 dose by mouth twice a day 04/09/15   Fayrene Helper, MD  folic acid (FOLVITE) 665 MCG tablet Take 400 mcg by mouth daily.    [provider]  furosemide (LASIX) 40 MG tablet TAKE 1 TABLET BY MOUTH DAILY 12/30/17   Fayrene Helper, MD  gabapentin (NEURONTIN) 300 MG capsule TAKE ONE CAPSULE BY MOUTH AT BEDTIME 02/15/18   Fayrene Helper, MD  glipiZIDE (GLUCOTROL XL) 10 MG 24 hr tablet Take 1 tablet (10 mg total) by mouth daily with breakfast. Dose reduction effective 03/19/20219 02/10/18   Fayrene Helper, MD  glipiZIDE (GLUCOTROL XL) 2.5 MG 24 hr tablet Take 1 tablet (2.5 mg total) by mouth daily with breakfast. 11/24/17   Fayrene Helper, MD  guaiFENesin (MUCINEX) 600 MG 12 hr tablet Take 1 tablet (600 mg total) by mouth 2 (two) times daily. 02/20/18 02/20/19  Kathie Dike, MD    Insulin Pen Needle 31G X 5 MM MISC Use once daily to inject insulin dx E11.65 08/12/16   Fayrene Helper, MD  ipratropium-albuterol (DUONEB) 0.5-2.5 (3) MG/3ML SOLN Take 3 mLs by nebulization 4 (four) times daily as needed.     [provider]  levalbuterol Penne Lash HFA) 45 MCG/ACT inhaler Inhale 1-2 puffs into the lungs as needed.      [provider]  levothyroxine (SYNTHROID, LEVOTHROID) 100 MCG tablet TAKE 1 TABLET ONCE DAILY 03/29/18   Fayrene Helper, MD  lisinopril (PRINIVIL,ZESTRIL)  5 MG tablet Take 1 tablet (5 mg total) by mouth daily. 02/10/18   Fayrene Helper, MD  montelukast (SINGULAIR) 10 MG tablet TAKE 1 TABLET BY MOUTH AT BEDTIME 10/19/17   Fayrene Helper, MD  rosuvastatin (CRESTOR) 20 MG tablet TAKE 1 TABLET ONCE DAILY 02/15/18   Fayrene Helper, MD  Vitamin D, Ergocalciferol, (DRISDOL) 50000 units CAPS capsule TAKE 1 CAPSULE BY MOUTH ONCE WEEKLY 12/30/17   Fayrene Helper, MD  zolpidem (AMBIEN) 10 MG tablet TAKE 1 TABLET(10 MG) BY MOUTH AT BEDTIME 12/21/17   Cloria Spring, MD    Physical Exam: Vitals:   04/05/18 0215 04/05/18 0217 04/05/18 0230 04/05/18 0300  BP:  (!) 133/99 (!) 153/89 (!) 144/87  Pulse: (!) 126 (!) 121  (!) 104  Resp: (!) 25 (!) 25 (!) 21 18  SpO2: 91% 94%  90%    Constitutional: NAD, anxious  Eyes: PERTLA, lids and conjunctivae normal ENMT: Mucous membranes are moist. Posterior pharynx clear of any exudate or lesions.   Neck: normal, supple, no masses, no thyromegaly Respiratory: Diminished bilaterally with prolonged expiratory phase. No wheezing. No accessory muscle use.  Cardiovascular: Rate ~100 and irregular. No extremity edema.   Abdomen: No distension, no tenderness, soft. Bowel sounds active.  Musculoskeletal: no clubbing / cyanosis. No joint deformity upper and lower extremities.    Skin: no significant rashes, lesions, ulcers. Warm, dry, well-perfused. Neurologic: No facial asymmetry. Sensation intact.  Moving all extremities.  Psychiatric: Alert and oriented x 3. Anxioius, but very pleasant and cooperative.    Labs on Admission: I have personally reviewed following labs and imaging studies  CBC: Recent Labs  Lab 04/05/18 0024  WBC 14.0*  NEUTROABS 9.5*  HGB 14.5  HCT 46.7*  MCV 94.2  PLT 761   Basic Metabolic Panel: Recent Labs  Lab 04/05/18 0024  NA 139  K 3.3*  CL 103  CO2 26  GLUCOSE 297*  BUN 12  CREATININE 1.00  CALCIUM 8.5*   GFR: CrCl cannot be calculated (Unknown ideal weight.). Liver Function Tests: No results for input(s): AST, ALT, ALKPHOS, BILITOT, PROT, ALBUMIN in the last 168 hours. No results for input(s): LIPASE, AMYLASE in the last 168 hours. No results for input(s): AMMONIA in the last 168 hours. Coagulation Profile: No results for input(s): INR, PROTIME in the last 168 hours. Cardiac Enzymes: No results for input(s): CKTOTAL, CKMB, CKMBINDEX, TROPONINI in the last 168 hours. BNP (last 3 results) No results for input(s): PROBNP in the last 8760 hours. HbA1C: No results for input(s): HGBA1C in the last 72 hours. CBG: No results for input(s): GLUCAP in the last 168 hours. Lipid Profile: No results for input(s): CHOL, HDL, LDLCALC, TRIG, CHOLHDL, LDLDIRECT in the last 72 hours. Thyroid Function Tests: No results for input(s): TSH, T4TOTAL, FREET4, T3FREE, THYROIDAB in the last 72 hours. Anemia Panel: No results for input(s): VITAMINB12, FOLATE, FERRITIN, TIBC, IRON, RETICCTPCT in the last 72 hours. Urine analysis:    Component Value Date/Time   COLORURINE STRAW (A) 07/18/2016 1151   APPEARANCEUR CLEAR 07/18/2016 1151   LABSPEC 1.011 07/18/2016 1151   PHURINE 5.0 07/18/2016 1151   GLUCOSEU NEGATIVE 07/18/2016 1151   HGBUR NEGATIVE 07/18/2016 [place] 07/18/2016 1151   KETONESUR NEGATIVE 07/18/2016 1151   PROTEINUR NEGATIVE 07/18/2016 1151   NITRITE NEGATIVE 07/18/2016 1151   LEUKOCYTESUR NEGATIVE 07/18/2016 1151    Sepsis Labs: @LABRCNTIP (procalcitonin:4,lacticidven:4) )No results found for this or any previous visit (from the past 240  hour(s)).   Radiological Exams on Admission: Dg Chest 2 View  Result Date: 04/05/2018 CLINICAL DATA:  Chest pain and shortness of breath. EXAM: CHEST - 2 VIEW COMPARISON:  Chest radiograph and CT 02/18/2018 FINDINGS: The cardiac silhouette is mildly enlarged. Aortic atherosclerosis is noted. Persistent right hilar enlargement corresponds to lymphadenopathy on CT. There is chronic interstitial lung disease which is greatest in the right upper lobe and left lower lung. Patchy right upper lobe opacity is similar to the prior radiograph, and there is a posterior right upper lobe mass as seen on CT. Compared to the prior radiograph, no definite acute airspace consolidation is seen. No pleural effusion or pneumothorax is identified. IMPRESSION: Chronic lung disease, right upper lobe mass, and right hilar lymphadenopathy without definite acute abnormality. Electronically Signed   By: Logan Bores M.D.   On: 04/05/2018 01:46    EKG: Independently reviewed. SVT, rate 181, repolarization abnormality.   Assessment/Plan   1. Chest pain  - Presents with 3-4 days of intermittent chest pain with palpitations and SOB  - She was treated with ASA 324 mg and NTG x1 prior to arrival  - Initial troponin is wnl, initial EKG features a sinus rhythm with sinus arrhythmia  - She had recurrence in chest pain while in ED, was noted to be in SVT with rate 180, and pain eased-off after successful treatment with adenosine, suggesting that this was etiology of her sxs though an ischemic etiology for the SVT is also possible  - Continue cardiac monitoring, obtain serial troponin measurements, continue ASA and statin    2. SVT  - Presents with 3-4 days of intermittent chest pain with palpitations and SOB  - She developed recurrent chest pain in ED and was noted to be in SVT with rate 180   - She was  treated with adenosine 6 mg x1 in ED with good effect, now in SR with HR 90-100 - Continue cardiac monitoring, replace electrolytes, serial troponins    3. COPD; chronic hypoxic respiratory failure  - She reports increased SOB last 3-4 days with stable chronic cough with clear sputum  - Continue ICS/LABA and as-needed Xoponex; use Spiriva in place of duoneb for now in order to avoid albuterol given her tachycardia  - Continue supplemental O2    4. Right lung mass  - Recently identified, likely cancer, has not been biopsied and patient has elected for palliative approach    5. Depression with anxiety  - She is anxious in ED and treated with a dose of Ativan  - Continue Abilify, Prozac, and Xanax   6. Type II DM  - A1c was 9.1% in September  - Managed with glipizide and metformin at home, held on admission  - Check CBG's and use SSI with Novolog while in hospital   7. Hypertension  - BP at goal  - Hold clonidine in light of arrhythmia, continue lisinopril    8. Hypothyroidism  - Continue Synthroid    DVT prophylaxis: Lovenox Code Status: DNR  Family Communication: Discussed with patient Consults called: None  Admission status: Observation     Vianne Bulls, MD [facility] Pager [phone]  If 7PM-7AM, please contact night-coverage [email or web address] Password [facility]  04/05/2018, 3:39 AM

## 2018-04-05 NOTE — Care Management Obs Status (Signed)
[place] NOTIFICATION   Patient Details  Name: [name] MRN: [id number] Date of Birth: 1951-05-17   Medicare Observation Status Notification Given:  Yes    Shelda Altes 04/05/2018, 10:04 AM

## 2018-04-05 NOTE — Clinical Social Work Note (Signed)
Received CSW consult for pt stating pt needs help affording her medications. Notified RN CM who will follow up on this concern. Will clear the CSW consult.

## 2018-04-05 NOTE — ED Notes (Signed)
Pt called out stating she felt like she could not breathe; went in to check on pt and pt's heartrate found to be in the 180's; Dr. Betsey Holiday notified and EKG ordered and orders for adenosine; crash cart placed in room and pads placed on pt's chest

## 2018-04-05 NOTE — Progress Notes (Signed)
CRITICAL VALUE ALERT  Critical Value:  Troponin 0.09  Date & Time Notied:  04/05/18 1572  Provider Notified: Opyd  Orders Received/Actions taken: no orders at this time

## 2018-04-05 NOTE — Progress Notes (Signed)
Patient admitted to the hospital by Dr. Myna Hidalgo this morning  Patient seen and examined. No further chest discomfort. Still feels a little short of breath and not back to baseline. Has chronic 1+ edema. Lungs are clear bilaterally.  Patient with advanced, oxygen dependent COPD and right lung mass, who presents to the hospital with chest pain and shortness of breath. In ED she had an episode of chest pain and found to be in SVT with heart rate in the 180. She received adenosine which changed heart rate to sinus tachycardia. Since that time heart rate in the 70-80 in sinus. She has been started on low dose metoprolol for suppression of tachycardia. Will check echo. Check TSH. Continue to monitor.  [employer]

## 2018-04-05 NOTE — Progress Notes (Signed)
Initial Nutrition Assessment  DOCUMENTATION CODES:  Obesity unspecified  INTERVENTION:  Glucerna Shake po TID, each supplement provides 220 kcal and 10 grams of protein  D/C ensure  NUTRITION DIAGNOSIS:  Unintentional weight loss related to poor appetite, chronic illness (?cancer), acute illness (copd exacerbation/SVT) as evidenced by loss of >5% bw x 1 month  GOAL:  Patient will meet greater than or equal to 90% of their needs  MONITOR:  PO intake, Supplement acceptance, Weight trends, Labs  REASON FOR ASSESSMENT:  Malnutrition Screening Tool    ASSESSMENT:  67 y/o female PMHx COPD w/ Chronic Resp Failure (on chronic O2), DM2, Depression, Anxiety, R lung mass for which she declines tx. Presents to ED with 3-4 Days Chest pain and SOB. Found to be in SVT. Admitted for management.    RD saw pt during her last admission  9/19-9/21 for which she was treated for COPD exacerbation. She says she has not been eating well since she left the hospital. Pt is a poor historian and RD unable to gather much detailed information regarding intake. From what it sounds like, she has been having lack of appetite d/t stress/anxiety relating to her underlying chronic illnesses. She also says her COPD has negatively impacted her appetite.   She notes she has been obtaining much of her nutrition through supplements. She had been drinking 4 Glucerna Supplements "until I ran out". Unclear when she ran out. During the period she was consuming 4 supplements/d, she says her food intake was minimal, largely comprising just snacks, such as PB crackers.   She does not sound to have followed a therapeutic diet at home. She has not been checking her BG.   Weight wise, she was 213 lbs 1 month ago at her outpatient family appt. Yesterday, she was admitted at weight of 199 lbs. This is a loss of ~14 lbs x 1 month, which meets malnutrition criteria.   At this time, she reports eating 50% of meals. SHe is agreeable to  restarting Glucerna. Wt loss likely multifactorial, relating to decreased intake as well as increased expenditure from acute exacerbations and likely underlying cancer.    Labs: Bgs 215-300, WBC: 14.0, BNP: 251, K:3.3,   Meds: Ensure ENlive, Folate, Abilify, Insulin, PPI, IVF  Recent Labs  Lab 04/05/18 0024 04/05/18 0542  NA 139 141  K 3.3* 3.4*  CL 103 102  CO2 26 29  BUN 12 12  CREATININE 1.00 1.00  CALCIUM 8.5* 8.6*  MG  --  1.7  GLUCOSE 297* 271*    NUTRITION - FOCUSED PHYSICAL EXAM:   Most Recent Value  Orbital Region  No depletion  Upper Arm Region  No depletion  Thoracic and Lumbar Region  No depletion  Buccal Region  No depletion  Temple Region  No depletion  Clavicle Bone Region  No depletion  Clavicle and Acromion Bone Region  No depletion  Scapular Bone Region  No depletion  Dorsal Hand  No depletion  Patellar Region  No depletion  Anterior Thigh Region  No depletion  Posterior Calf Region  No depletion  Edema (RD Assessment)  None     Diet Order:   Diet Order            Diet heart healthy/carb modified Room service appropriate? Yes; Fluid consistency: Thin  Diet effective now             EDUCATION NEEDS:  No education needs have been identified at this time  Skin:  Skin Assessment: Reviewed  RN Assessment  Last BM:  11/4  Height:  Ht Readings from Last 1 Encounters:  04/05/18 5\' 3"  (1.6 m)   Weight:  Wt Readings from Last 1 Encounters:  04/05/18 90.6 kg   Wt Readings from Last 10 Encounters:  04/05/18 90.6 kg  03/02/18 96.6 kg  02/18/18 101.9 kg  12/21/17 108.9 kg  11/24/17 101.2 kg  11/09/17 102.1 kg  08/18/17 102.5 kg  07/29/17 105.2 kg  03/31/17 105.7 kg  02/17/17 109.2 kg   Ideal Body Weight:  52.27 kg  BMI:  Body mass index is 35.38 kg/m.  Estimated Nutritional Needs:  Kcal:  1650-1800 (18-20 kcal/kg bw) Protein:  73-84g Pro (1.4-1.6g/kg bw) Fluid:  1.6-1.8 L fluid (46ml/kcal bw)  Burtis Junes RD, LDN, CNSC Clinical  Nutrition Available Tues-Sat via Pager: [phone] 04/05/2018 12:14 PM

## 2018-04-06 ENCOUNTER — Observation Stay (HOSPITAL_BASED_OUTPATIENT_CLINIC_OR_DEPARTMENT_OTHER): Payer: Medicare Other

## 2018-04-06 DIAGNOSIS — R918 Other nonspecific abnormal finding of lung field: Secondary | ICD-10-CM | POA: Diagnosis not present

## 2018-04-06 DIAGNOSIS — E038 Other specified hypothyroidism: Secondary | ICD-10-CM | POA: Diagnosis not present

## 2018-04-06 DIAGNOSIS — I471 Supraventricular tachycardia: Secondary | ICD-10-CM | POA: Diagnosis not present

## 2018-04-06 DIAGNOSIS — J9611 Chronic respiratory failure with hypoxia: Secondary | ICD-10-CM | POA: Diagnosis not present

## 2018-04-06 DIAGNOSIS — E1121 Type 2 diabetes mellitus with diabetic nephropathy: Secondary | ICD-10-CM

## 2018-04-06 DIAGNOSIS — F329 Major depressive disorder, single episode, unspecified: Secondary | ICD-10-CM | POA: Diagnosis not present

## 2018-04-06 DIAGNOSIS — J449 Chronic obstructive pulmonary disease, unspecified: Secondary | ICD-10-CM | POA: Diagnosis not present

## 2018-04-06 DIAGNOSIS — I272 Pulmonary hypertension, unspecified: Secondary | ICD-10-CM | POA: Diagnosis not present

## 2018-04-06 DIAGNOSIS — J441 Chronic obstructive pulmonary disease with (acute) exacerbation: Secondary | ICD-10-CM

## 2018-04-06 DIAGNOSIS — F419 Anxiety disorder, unspecified: Secondary | ICD-10-CM | POA: Diagnosis not present

## 2018-04-06 DIAGNOSIS — E1165 Type 2 diabetes mellitus with hyperglycemia: Secondary | ICD-10-CM

## 2018-04-06 DIAGNOSIS — R079 Chest pain, unspecified: Secondary | ICD-10-CM | POA: Diagnosis not present

## 2018-04-06 DIAGNOSIS — E876 Hypokalemia: Secondary | ICD-10-CM

## 2018-04-06 DIAGNOSIS — I5030 Unspecified diastolic (congestive) heart failure: Secondary | ICD-10-CM | POA: Diagnosis not present

## 2018-04-06 LAB — GLUCOSE, CAPILLARY
GLUCOSE-CAPILLARY: 200 mg/dL — AB (ref 70–99)
Glucose-Capillary: 244 mg/dL — ABNORMAL HIGH (ref 70–99)
Glucose-Capillary: 368 mg/dL — ABNORMAL HIGH (ref 70–99)

## 2018-04-06 LAB — ECHOCARDIOGRAM COMPLETE
HEIGHTINCHES: 63 in
Weight: 3319.25 oz

## 2018-04-06 LAB — TSH: TSH: 1.879 u[IU]/mL (ref 0.350–4.500)

## 2018-04-06 MED ORDER — METOPROLOL TARTRATE 25 MG PO TABS: 25.0000 mg | ORAL_TABLET | Freq: Two times a day (BID) | ORAL | 0 refills | Status: AC

## 2018-04-06 MED ORDER — OXYBUTYNIN CHLORIDE 5 MG PO TABS: 5.0000 mg | ORAL_TABLET | Freq: Three times a day (TID) | ORAL | 0 refills | Status: AC | PRN

## 2018-04-06 NOTE — Discharge Summary (Signed)
Physician Discharge Summary  EH SAUSEDA VPX:[id number] DOB: 1951-03-25 DOA: 04/04/2018  PCP: Fayrene Helper, MD  Admit date: 04/04/2018 Discharge date: 04/06/2018  Admitted From: Home Disposition: Home  Recommendations for Outpatient Follow-up:  1. Follow up with PCP in 1-2 weeks 2. Please obtain BMP/CBC in one week 3. Follow-up with pulmonologist in 1 week 4. Patient has been referred to outpatient palliative services  Home Health: Resume home health on discharge Equipment/Devices:  Discharge Condition: Stable CODE STATUS: DNR Diet recommendation: Heart healthy, carb modified  Brief/Interim Summary: 67 year old female with a history of advanced COPD, chronic hypoxic respiratory failure on 4 L of oxygen, type 2 diabetes, right lung mass for which she desires a palliative approach, presents to the hospital with complaints of chest discomfort and shortness of breath.  In the emergency room, she was noted to be in SVT with a heart rate in the 180s.  During this episode, she was having significant chest pressure.  She received adenosine which did improve her heart rate and resolved her chest pressure.  Echocardiogram was done which showed a normal ejection fraction, but did comment on severe pulmonary hypertension with PA pressure of 100 mmHg.  This is likely secondary to her severe COPD.  Patient was started on low-dose beta-blockers which have helped control her heart rate.  She has not had any recurrence of SVT.  Her breathing appears to be at baseline.  She is chronically short of breath and has limited functional capacity.  She does not have any wheezing at this time.  Feels that her shortness of breath is currently at baseline.  At baseline, she has limited mobility.  She was being followed by home health services, but had difficulty participating with physical therapy due to her limitations from respiratory standpoint.  She has been referred for outpatient palliative care services  for assistance with symptom management.  The remainder of chronic medical issues has remained stable.  Discharge Diagnoses:  Principal Problem:   SVT (supraventricular tachycardia) (HCC) Active Problems:   Hypothyroidism   Anxiety and depression   Essential hypertension   COPD, severe ([place])   Uncontrolled diabetes mellitus with diabetic nephropathy (HCC)   Chronic respiratory failure with hypoxia (HCC)   Hypokalemia   Mass of upper lobe of right lung   Chest pain   Pulmonary hypertension [facility])    Discharge Instructions  Discharge Instructions    Diet - low sodium heart healthy   Complete by:  As directed    Increase activity slowly   Complete by:  As directed      Allergies as of 04/06/2018   No Active Allergies     Medication List    TAKE these medications   acetaminophen 325 MG tablet Commonly known as:  TYLENOL Take 650 mg by mouth every 6 (six) hours as needed.   ALPRAZolam 1 MG tablet Commonly known as:  XANAX TAKE 1 TABLET BY MOUTH FOUR TIMES DAILY   ARIPiprazole 2 MG tablet Commonly known as:  ABILIFY Take 1 tablet (2 mg total) by mouth daily.   ASPIRIN LOW DOSE 81 MG EC tablet Generic drug:  aspirin Take 81 mg by mouth daily.   cloNIDine 0.2 MG tablet Commonly known as:  CATAPRES One tablet at bedtime for blood pressure   clotrimazole-betamethasone cream Commonly known as:  LOTRISONE APPLY TO THE AFFECTED AREA TWICE DAILY   DUONEB 0.5-2.5 (3) MG/3ML Soln Generic drug:  ipratropium-albuterol Take 3 mLs by nebulization 4 (four) times daily as needed.  esomeprazole 40 MG capsule Commonly known as:  NEXIUM TAKE 1 CAPSULE BY MOUTH EVERY MORNING   ferrous sulfate 325 (65 FE) MG tablet Take 325 mg by mouth every other day.   FLUoxetine 20 MG capsule Commonly known as:  PROZAC Take two in the am and one in the evening   Fluticasone-Salmeterol 250-50 MCG/DOSE Aepb Commonly known as:  ADVAIR inhale 1 dose by mouth twice a day   folic acid  623 MCG tablet Commonly known as:  FOLVITE Take 400 mcg by mouth daily.   furosemide 40 MG tablet Commonly known as:  LASIX TAKE 1 TABLET BY MOUTH DAILY   gabapentin 300 MG capsule Commonly known as:  NEURONTIN TAKE ONE CAPSULE BY MOUTH AT BEDTIME   glipiZIDE 2.5 MG 24 hr tablet Commonly known as:  GLUCOTROL XL Take 1 tablet (2.5 mg total) by mouth daily with breakfast.   glipiZIDE 10 MG 24 hr tablet Commonly known as:  GLUCOTROL XL Take 1 tablet (10 mg total) by mouth daily with breakfast. Dose reduction effective 03/19/20219   guaiFENesin 600 MG 12 hr tablet Commonly known as:  MUCINEX Take 1 tablet (600 mg total) by mouth 2 (two) times daily.   Insulin Pen Needle 31G X 5 MM Misc Use once daily to inject insulin dx E11.65   levothyroxine 100 MCG tablet Commonly known as:  SYNTHROID, LEVOTHROID TAKE 1 TABLET ONCE DAILY   lisinopril 5 MG tablet Commonly known as:  PRINIVIL,ZESTRIL Take 1 tablet (5 mg total) by mouth daily.   metoprolol tartrate 25 MG tablet Commonly known as:  LOPRESSOR Take 1 tablet (25 mg total) by mouth 2 (two) times daily.   montelukast 10 MG tablet Commonly known as:  SINGULAIR TAKE 1 TABLET BY MOUTH AT BEDTIME   oxybutynin 5 MG tablet Commonly known as:  DITROPAN Take 1 tablet (5 mg total) by mouth every 8 (eight) hours as needed for bladder spasms.   rosuvastatin 20 MG tablet Commonly known as:  CRESTOR TAKE 1 TABLET ONCE DAILY   TUMS 500 MG chewable tablet Generic drug:  calcium carbonate Chew 1 tablet by mouth as needed.   Vitamin D (Ergocalciferol) 50000 units Caps capsule Commonly known as:  DRISDOL TAKE 1 CAPSULE BY MOUTH ONCE WEEKLY   XOPENEX HFA 45 MCG/ACT inhaler Generic drug:  levalbuterol Inhale 1-2 puffs into the lungs as needed.   zolpidem 10 MG tablet Commonly known as:  AMBIEN TAKE 1 TABLET(10 MG) BY MOUTH AT BEDTIME       No Active Allergies  Consultations:     Procedures/Studies: Dg Chest 2  View  Result Date: 04/05/2018 CLINICAL DATA:  Chest pain and shortness of breath. EXAM: CHEST - 2 VIEW COMPARISON:  Chest radiograph and CT 02/18/2018 FINDINGS: The cardiac silhouette is mildly enlarged. Aortic atherosclerosis is noted. Persistent right hilar enlargement corresponds to lymphadenopathy on CT. There is chronic interstitial lung disease which is greatest in the right upper lobe and left lower lung. Patchy right upper lobe opacity is similar to the prior radiograph, and there is a posterior right upper lobe mass as seen on CT. Compared to the prior radiograph, no definite acute airspace consolidation is seen. No pleural effusion or pneumothorax is identified. IMPRESSION: Chronic lung disease, right upper lobe mass, and right hilar lymphadenopathy without definite acute abnormality. Electronically Signed   By: Logan Bores M.D.   On: 04/05/2018 01:46   US Breast Ltd Uni Right Inc Axilla  Result Date: 03/11/2018 CLINICAL DATA:  Follow-up probable  right breast bruising seen in August 2018. The patient did not return for a recommended 3 month follow-up mammogram and ultrasound. She has an enlarging lung mass on CT. EXAM: DIGITAL DIAGNOSTIC BILATERAL MAMMOGRAM WITH CAD AND TOMO ULTRASOUND RIGHT BREAST COMPARISON:  Previous exam(s). ACR Breast Density Category b: There are scattered areas of fibroglandular density. FINDINGS: The previously seen asymmetry in the superior right breast is no longer demonstrated. There are no mammographic findings suspicious for malignancy in either breast. Mammographic images were processed with CAD. On physical exam, no mass is palpable in the upper right breast. Targeted ultrasound is performed, showing showing an approximately 2.1 x 1.0 x 0.5 cm poorly defined, oval, elongated, echogenic area within a subcutaneous fat lobule in the 1 o'clock position of the right breast, 15 cm from the nipple. This was previously labeled as 10 cm from the nipple, measuring 2.0 x 1.5 x  0.6 cm on 01/16/2017. This was previously slightly more echogenic and better visualized. This contains a small central hypoechoic component which is less prominent. IMPRESSION: 1. Interval decrease in size of an area of fat necrosis in the 1 o'clock position of the right breast, only seen sonographically today. 2. No evidence of malignancy in either breast. RECOMMENDATION: Bilateral screening mammogram in 1 year. I have discussed the findings and recommendations with the patient. Results were also provided in writing at the conclusion of the visit. If applicable, a reminder letter will be sent to the patient regarding the next appointment. BI-RADS CATEGORY  2: Benign. Electronically Signed   By: Claudie Revering M.D.   On: 03/11/2018 16:25   Mm Diag Breast Tomo Bilateral  Result Date: 03/11/2018 CLINICAL DATA:  Follow-up probable right breast bruising seen in August 2018. The patient did not return for a recommended 3 month follow-up mammogram and ultrasound. She has an enlarging lung mass on CT. EXAM: DIGITAL DIAGNOSTIC BILATERAL MAMMOGRAM WITH CAD AND TOMO ULTRASOUND RIGHT BREAST COMPARISON:  Previous exam(s). ACR Breast Density Category b: There are scattered areas of fibroglandular density. FINDINGS: The previously seen asymmetry in the superior right breast is no longer demonstrated. There are no mammographic findings suspicious for malignancy in either breast. Mammographic images were processed with CAD. On physical exam, no mass is palpable in the upper right breast. Targeted ultrasound is performed, showing showing an approximately 2.1 x 1.0 x 0.5 cm poorly defined, oval, elongated, echogenic area within a subcutaneous fat lobule in the 1 o'clock position of the right breast, 15 cm from the nipple. This was previously labeled as 10 cm from the nipple, measuring 2.0 x 1.5 x 0.6 cm on 01/16/2017. This was previously slightly more echogenic and better visualized. This contains a small central hypoechoic  component which is less prominent. IMPRESSION: 1. Interval decrease in size of an area of fat necrosis in the 1 o'clock position of the right breast, only seen sonographically today. 2. No evidence of malignancy in either breast. RECOMMENDATION: Bilateral screening mammogram in 1 year. I have discussed the findings and recommendations with the patient. Results were also provided in writing at the conclusion of the visit. If applicable, a reminder letter will be sent to the patient regarding the next appointment. BI-RADS CATEGORY  2: Benign. Electronically Signed   By: Claudie Revering M.D.   On: 03/11/2018 16:25       Subjective: Feels better today.  No chest pressure.  Breathing appears to be at baseline.  Discharge Exam: Vitals:   04/06/18 0732 04/06/18 0800 04/06/18 0900 04/06/18  1110  BP:  (!) 159/112    Pulse: 64 61  63  Resp: (!) 23 17  (!) 23  Temp: (!) 97.5 F (36.4 C)   97.6 F (36.4 C)  TempSrc: Oral   Oral  SpO2: 99% 98% 96% 96%  Weight:      Height:        General: Pt is alert, awake, not in acute distress Cardiovascular: RRR, S1/S2 +, no rubs, no gallops Respiratory: Diminished breath sounds bilaterally, no wheezing Abdominal: Soft, NT, ND, bowel sounds + Extremities: no edema, no cyanosis    The results of significant diagnostics from this hospitalization (including imaging, microbiology, ancillary and laboratory) are listed below for reference.     Microbiology: Recent Results (from the past 240 hour(s))  MRSA PCR Screening     Status: None   Collection Time: 04/05/18  4:34 AM  Result Value Ref Range Status   MRSA by PCR NEGATIVE NEGATIVE Final    Comment:        The GeneXpert MRSA Assay (FDA approved for NASAL specimens only), is one component of a comprehensive MRSA colonization surveillance program. It is not intended to diagnose MRSA infection nor to guide or monitor treatment for MRSA infections. Performed at [facility], [address],  [place], [place] [address]      Labs: BNP (last 3 results) Recent Labs    02/18/18 0927 04/05/18 0024  BNP 1,721.0* 607.3*   Basic Metabolic Panel: Recent Labs  Lab 04/05/18 0024 04/05/18 0542  NA 139 141  K 3.3* 3.4*  CL 103 102  CO2 26 29  GLUCOSE 297* 271*  BUN 12 12  CREATININE 1.00 1.00  CALCIUM 8.5* 8.6*  MG  --  1.7   Liver Function Tests: No results for input(s): AST, ALT, ALKPHOS, BILITOT, PROT, ALBUMIN in the last 168 hours. No results for input(s): LIPASE, AMYLASE in the last 168 hours. No results for input(s): AMMONIA in the last 168 hours. CBC: Recent Labs  Lab 04/05/18 0024  WBC 14.0*  NEUTROABS 9.5*  HGB 14.5  HCT 46.7*  MCV 94.2  PLT 299   Cardiac Enzymes: Recent Labs  Lab 04/05/18 0542 04/05/18 1131  TROPONINI 0.09* 0.07*   BNP: Invalid input(s): POCBNP CBG: Recent Labs  Lab 04/05/18 1135 04/05/18 1625 04/05/18 2142 04/06/18 0729 04/06/18 1111  GLUCAP 217* 269* 148* 244* 368*   D-Dimer No results for input(s): DDIMER in the last 72 hours. Hgb A1c No results for input(s): HGBA1C in the last 72 hours. Lipid Profile No results for input(s): CHOL, HDL, LDLCALC, TRIG, CHOLHDL, LDLDIRECT in the last 72 hours. Thyroid function studies Recent Labs    04/06/18 0412  TSH 1.879   Anemia work up No results for input(s): VITAMINB12, FOLATE, FERRITIN, TIBC, IRON, RETICCTPCT in the last 72 hours. Urinalysis    Component Value Date/Time   COLORURINE STRAW (A) 07/18/2016 1151   APPEARANCEUR CLEAR 07/18/2016 1151   LABSPEC 1.011 07/18/2016 1151   PHURINE 5.0 07/18/2016 1151   GLUCOSEU NEGATIVE 07/18/2016 1151   HGBUR NEGATIVE 07/18/2016 [place] 07/18/2016 1151   KETONESUR NEGATIVE 07/18/2016 1151   PROTEINUR NEGATIVE 07/18/2016 1151   NITRITE NEGATIVE 07/18/2016 1151   LEUKOCYTESUR NEGATIVE 07/18/2016 1151   Sepsis Labs Invalid input(s): PROCALCITONIN,  WBC,  LACTICIDVEN Microbiology Recent Results (from the  past 240 hour(s))  MRSA PCR Screening     Status: None   Collection Time: 04/05/18  4:34 AM  Result Value Ref Range  Status   MRSA by PCR NEGATIVE NEGATIVE Final    Comment:        The GeneXpert MRSA Assay (FDA approved for NASAL specimens only), is one component of a comprehensive MRSA colonization surveillance program. It is not intended to diagnose MRSA infection nor to guide or monitor treatment for MRSA infections. Performed at [facility], [address] [place], [place] [address]      Time coordinating discharge: 70mins  SIGNED:   Kathie Dike, MD  [facility] 04/06/2018, 12:03 PM Pager   If 7PM-7AM, please contact night-coverage [email or web address] Password [email or web address]

## 2018-04-06 NOTE — Care Management (Signed)
CBC with Differential/Platelet  Order: [id number]  Status:  Final result Visible to patient:  No (Not Released) Next appt:  None   Ref Range & Units 1d ago (04/05/18) 67mo ago (03/02/18) 95mo ago (02/18/18) 41mo ago (11/09/17) 2yr ago (10/25/16)  WBC 4.0 - 10.5 K/uL 14.0High   14.4High  R 9.9  11.5High  R 10.5   RBC 3.87 - 5.11 MIL/uL 4.96  5.03 R 4.72  4.58 R 3.91   Hemoglobin 12.0 - 15.0 g/dL 14.5  14.8 R 14.6  13.5 R 11.5Low    HCT 36.0 - 46.0 % 46.7High   46.1High  R 45.9  40.9 R 37.0   MCV 80.0 - 100.0 fL 94.2  91.7  97.2 R 89.3  94.6 R  MCH 26.0 - 34.0 pg 29.2  29.4 R 30.9  29.5 R 29.4   MCHC 30.0 - 36.0 g/dL 31.0  32.1 R 31.8  33.0 R 31.1   RDW 11.5 - 15.5 % 13.4  12.1 R 13.5  12.2 R 14.1   Platelets 150 - 400 K/uL 299  311 R 244  323 R 238   nRBC 0.0 - 0.2 % 0.0       Neutrophils Relative % % 66   87     Neutro Abs 1.7 - 7.7 K/uL 9.5High    8.7High      Lymphocytes Relative % 21   7     Lymphs Abs 0.7 - 4.0 K/uL 2.9   0.7     Monocytes Relative % 8   3     Monocytes Absolute 0.1 - 1.0 K/uL 1.1High    0.3     Eosinophils Relative % 3   2     Eosinophils Absolute 0.0 - 0.5 K/uL 0.4   0.2 R    Basophils Relative % 1   1     Basophils Absolute 0.0 - 0.1 K/uL 0.1   0.1 CM    Immature Granulocytes % 1       Abs Immature Granulocytes 0.00 - 0.07 K/uL 0.09High

## 2018-04-06 NOTE — Care Management Note (Addendum)
Case Management Note  Patient Details  Name: [name] MRN: [id number] Date of Birth: 1950-09-14  Subjective/Objective: SVT/COPD/Lung mass(biopsied believed to be cancer, seeking no treatments).  Pt is from home, reports she lives alone, nephew helps out as needed. Brother in law assists with transportation needs. She uses a cane with ambulation. She has home O2 and is at baseline on 4- 5lpm provided by [employer]. She has the ability to go up to 10L in her home. She has [employer] with prescription coverage. CM consulted for medication assistance. She reports sometimes money runs out. Patient is [employer] eligible. Discussed [employer]'s role as a insurance benefit. Will consult [facility] for medication review to see if any help can be enlisted for cost of medications.  Reviewed palliative notes per last admission that stated patient would like hospice/palliative when needed.  Discussed palliative with patient , she says she has discussed with her pulmonologist -Dr. Luan Pulling.  She reports she has good follow up with Dr. Luan Pulling and her PCP -Dr. Moshe Cipro and will consult with them on palliative and hospice needs.  Should patient need palliative only services, [facility] may be able to service patient. Will give pamphlet for review.  Patient reports she was active with [facility] home [facility] but could not participate much due to SOB/COPD.      Action/Plan: DC. ? With resumption of home health if still current. CM will reach out to [facility].   ADDENDUM 11:50: Patient is active with [facility] for RN and PT but having trouble participating with PT due to SOB.  Discussed during progression rounds, attending has discussed palliative with patient and she is agreeable. CM met with patient again, who is agreeable to palliative referral with [facility]. Referral made. Routing all necessary paperwork and will route DC summary when available.  Will notify Sharyn Lull of [facility] that patient will have  palliative referral.   Expected Discharge Date:   04/07/2018              Expected Discharge Plan:  Home/Self Care  In-House Referral:   Palliative [facility])  Discharge planning Services  CM Consult, Medication Assistance  Post Acute Care Choice:  NA Choice offered to:  NA  DME Arranged:    DME Agency:     HH Arranged:    HH Agency:     Status of Service:  Completed, signed off  If discussed at [employer], dates discussed:    Additional Comments:  Lashika Erker, Chauncey [place], RN 04/06/2018, 9:10 AM

## 2018-04-06 NOTE — Care Management (Signed)
Patient Information   Patient Name [name], [name] ([id number]) Sex Female DOB [birth year]  Room Bed  IC06 IC06-01  Patient Demographics   Address [address] [place] [address] Phone [phone] (Home) *Preferred*  Patient Ethnicity & Race   Ethnic Group Patient Race  Not Hispanic or Latino Black or African American  Emergency Contact(s)   Name Relation Home Work [place] Sister [phone]  [phone]  Documents on File    Status Date Received Description  Documents for the Patient  EMR Medication Summary Not Received    EMR Immunization Summary Not Received    EMR Problem Summary Not Received    EMR Patient Summary Not Received    [place] Received 07/31/10   [place] E-Signature HIPAA Notice of Privacy Received 07/31/10   [place] E-Signature HIPAA Notice of Privacy Spanish Not Received    Driver's License Received 51/76/16   Advance Directives/Living Will/HCPOA/POA Not Received    Historic Radiology Documentation Not Received    Insurance Card Received 11/26/10   Insurance Card Not Received    Financial Application Not Received    AMB Correspondence Not Received  Phys Order AP  AMB Correspondence Not Received  08/12 letter AP   AMB Correspondence Not Received  06/12 office note   AMB Correspondence Not Received  02/13 Office Note Luan Pulling MD,   AMB Provider Completed Forms Not Received  03/13 Order [employer]  [place] E-Signature HIPAA Notice of Privacy Not Received    AMB Correspondence Not Received  11/12 Rx [facility]  AMB HH/NH/Hospice Not Received  03/13 Order [place] E-Signature HIPAA Notice of Privacy Received 12/19/11   AMB Correspondence Not Received  09/13 clinical note Luan Pulling MD  AMB Correspondence Not Received  10/13 Office Note [facility]  AMB Correspondence Not Received  10/13 OfficeNote [facility]  AMB Intake Forms/Questionnaires Not Received    AMB  Correspondence Not Received  01/14 Letter [facility]  AMB Correspondence Not Received  03/11 OFFICE NOTE HAWKINS MD,E  AMB Provider Completed Forms Not Received  04/14 order [place] E-Signature HIPAA Notice of Privacy Received 11/08/12   AMB Correspondence Not Received  Considerations  HIM ROI Authorization Not Received    AMB Provider Completed Forms Not Received  06/14 LMN [facility]  Release of Information Not Received    AMB Provider Completed Forms Not Received  07/14 letter/CMN [facility]  AMB Correspondence Not Received  09/14 office note Luan Pulling MD,   AMB Correspondence Not Received  10/14 Rx [facility]  AMB Correspondence Not Received  11/14 Rx [facility]  AMB Correspondence  06/10/13 12/14 Rx [facility]  AMB Correspondence  07/07/13 01/15 Rx [facility]  [place] E-Signature HIPAA Notice of Privacy Signed 07/15/13   AMB Correspondence  08/04/13 02/15 Rx [facility]  Insurance Card Received 08/10/13   Insurance Card  08/10/13   AMB Correspondence  09/09/13 03/15 RX Pick Up [facility]  [place] E-Signature HIPAA Notice of Privacy Signed 09/28/13 PATIENT SIGNED4/29/15/RJA/BHR  AMB Correspondence  10/18/13 04/15 RX [place] [facility]  AMB Correspondence  10/19/13 RX [place] [facility]  AMB Correspondence  11/30/13 RX [place] [facility]  AMB HH/NH/Hospice  12/15/13 CMN [facility]  AMB Correspondence  12/20/13 OFFICENOTE [place] [facility]  AMB Correspondence  12/30/13 RX PICKUP [place] [facility]  AMB Correspondence  02/10/14 RX [place]  PRIMARY CARE  AMB Correspondence  04/11/14 RX [place] [facility]  AMB Correspondence  03/20/14 RX [place] [facility]  AMB Correspondence  03/25/14 ORDER [place] [facility]  AMB Provider Completed Forms  04/19/14 ORDER [place] [facility]  AMB Correspondence  04/20/14 RX [place] [facility]  Release of Information  04/25/14 PHYSICIAN  VERIFICATION  AMB Correspondence  05/19/14 RX [place] [facility]  AMB Correspondence  01/26/14 RX CONTROLLED SUBSTANCE [place] [facility]  Insurance Card Received 01/16/17 BCG  AMB Correspondence  06/22/14 RX [place] [facility]  AMB Correspondence  08/11/14 RX [place] [employer]  AMB Correspondence  02/09/14 CLINICAL NOTE HAWKINS MD, E  AMB Correspondence  07/27/14 RX PICK-UP [place] [facility]  AMB Correspondence  09/04/14 RX [place] [facility]  Other Photo ID Not Received    AMB Correspondence  09/29/14 RX [place] [facility]  [place] E-Signature HIPAA Notice of Privacy Signed 11/09/14 11/09/14/PATIENT RIGHTS SIGNED  AMB Correspondence  10/24/14 RX [place] [facility]  [place] HIPAA NOTICE OF PRIVACY - Scanned  11/24/14   AMB Correspondence  12/19/14 RX CONTRACT [place] [facility]  AMB Correspondence  12/01/14 RX [place] [facility]  AMB Correspondence  01/09/15 ORDER [place] [facility]  AMB Correspondence  01/22/15 MOST  AMB Correspondence  01/26/15 RX [place] [facility]IMARY [facility]  AMB Correspondence  03/07/15 RX PICK-UP [place] [facility]  AMB Correspondence  03/30/15 RX ORDER [place] [facility]  AMB Correspondence  05/04/15 RX [place] [facility]  AMB Correspondence  06/01/15 RX [place] [facility]  AMB Provider Completed Forms  07/03/15 PHYSICIAN'S VERIFICATION [facility]  AMB Correspondence  06/18/15 MOST  [place] E-Signature HIPAA Notice of Privacy Signed 07/09/15 PATIENT RIGHTS SIGNED 07/09/15  AMB Correspondence  06/29/15 RX [place] [facility]  AMB Correspondence  07/27/15 CONTROLLED SUBSTANCE RX [place] PRIMARY CARE  AMB Correspondence  08/24/15 RX [place] PRIMARY C[facility]  AMB Correspondence  09/18/15 ORDER/RX [place] [facility]  AMB Correspondence  10/30/15 RX [facility] [place] [facility]IMARY [facility]  AMB Correspondence  11/28/15 RX [place] [facility]  AMB Correspondence  12/13/15 RX  [place] [facility]  AMB Correspondence  01/24/16 CLINICAL NOTE HAWKINS MD, E  AMB Outside Hospital Record  01/30/16 PROGRESS NOTE [facility]  AMB Correspondence  01/18/16 RX [place] [facility]IMARY [facility]ARE  AMB Correspondence  02/15/16 RX [place] [facility]IMARY C[facility]  AMB Correspondence  03/31/16 RX  PRIMARY CARE  AMB Correspondence  05/01/16 RX  PRIMARY CARE  Release of Information Received 06/09/16 06-09-16 RELEASE TO [place] [facility]  AMB Correspondence  06/06/16 RX ORDER  PRIMARY CARE  Release of Information  06/27/16   AMB Correspondence  06/06/16 OFFICE NOTE  AMB HH/NH/Hospice  08/03/16 DRUG INTERACTION REPORT  AMB HH/NH/Hospice  08/04/16 ORDER [facility]  AMB HH/NH/Hospice  07/28/16 ORDER [facility]  AMB HH/NH/Hospice  07/22/16 EPISODE DETAIL REPORT [facility]  AMB HH/NH/Hospice  05/25/81 CERTIFICATION/POC [facility] [place] E-Signature HIPAA Notice of Privacy Signed 10/08/16 10/08/16/RIGHTS SIGNED  Release of Information Received 11/04/16 dpr for all [facility]  AMB Correspondence  11/07/16 REFERRAL  PRIMARY CARE  AMB Correspondence  11/24/16 CLINICAL NOTE HAWKINS MD, E  [place] HIPAA NOTICE OF PRIVACY - Scanned Received 01/16/17 BCG  AMB Correspondence Received 05/12/17 LETTER BCBS OF   AMB Correspondence Received 05/05/17 PRIOR AUTH [facility]  Driver's License Received 50/03/70 NCDL  Insurance Card Received 12/21/17 New Medicare  DNR (Do Not Resuscitate) Documentation Received 02/22/18   Insurance Card  Received 03/11/18   [place] HIPAA NOTICE OF PRIVACY - Scanned Received 03/11/18 bcg 2019  Insurance Card Received 03/11/18 bcg 2019  AMB Provider Completed Forms (Deleted) 04/11/11 11/12 Rx [place] Mantis MD   AMB Provider Completed Forms (Deleted) 08/06/11 03/13 Order Moshe Cipro MD M  AMB Intake Forms/Questionnaires Not Received (Deleted)    AMB Correspondence (Deleted) 01/13/13 07/14  letter/CMN [facility]  AMB Correspondence (Deleted) 10/18/13 04/15 RX [place] [facility]  AMB Correspondence (Deleted) 08/30/14 CLINICAL NOTE HAWKINS MD, E  AMB Correspondence (Deleted) 09/05/14 RX PICK-UP [place] [facility]  AMB Correspondence (Deleted) 09/29/14 RX CONTRACT [place] [facility]  AMB Correspondence (Deleted) 10/30/15 ORDER [facility] [place] [facility]  AMB Correspondence (Deleted) 01/30/16 PROGRESS NOTE [facility]  Documents for the Encounter  AOB (Assignment of Insurance Benefits) Not Received    E-signature AOB Signed 04/05/18   MEDICARE RIGHTS Not Received    E-signature Medicare Rights Signed 04/05/18   ED Patient Billing Extract   ED PB Billing Extract  Cardiac Monitoring Strip Received 04/05/18   Cardiac Monitoring Strip Shift Summary Received 04/05/18   Medicare Observation  04/05/18   Cardiac Monitoring Strip - Scanned Received 04/05/18   EKG Received 04/05/18   Admission Information   Attending Provider Admitting Provider Admission Type Admission Date/Time  Kathie Dike, MD Vianne Bulls, MD Emergency 04/04/18 2355  Discharge Date Hospital Service Auth/Cert Status Service Area   Internal Medicine Incomplete [facility]  Unit Room/Bed Admission Status   AP-ICCUP NURSING IC06/IC06-01 Admission (Confirmed)   Admission   Complaint  [facility]   Name Acct ID Class Status Primary Coverage  Erandi, Lemma [id number] Observation Open MEDICARE - MEDICARE PART A AND B      Guarantor Account (for Hospital Account [id number])   Name Relation to Pt Service Area Active? Acct Type  Maris Berger Self CHSA Yes Personal/Family  Address Phone    [address] [place], [place] [address] [phone])        Coverage Information (for Hospital Account [id number])   F/O Payor/Plan Precert #  MEDICARE/MEDICARE PART A AND B   Subscriber Subscriber #  Lady, [place] [id number]   Address Phone  [address] [place], [place] [address]

## 2018-04-06 NOTE — Care Management (Signed)
Sharyn Lull of [facility] aware of DC today and palliative referral.

## 2018-04-06 NOTE — Care Management (Signed)
Contains abnormal data Basic metabolic panel  Order: [id number]  Status:  Final result Visible to patient:  No (Not Released) Next appt:  None   Ref Range & Units 1d ago (04/05/18) 1d ago (04/05/18) 88moago (03/02/18) 167mogo (02/18/18) 62m262moo (11/09/17)  Sodium 135 - 145 mmol/L 141  139  140 R 143  140 R  Potassium 3.5 - 5.1 mmol/L 3.4Low   3.3Low   4.4 R 3.1Low   4.0 R  Chloride 98 - 111 mmol/L 102  103  94Low  R 101  101 R  CO2 22 - 32 mmol/L 29  26  35High  R 30  33High  R  Glucose, Bld 70 - 99 mg/dL 271High   297High   219High  R, CM 248High   211High  R, CM  BUN 8 - 23 mg/dL '12  12  12 '$ R 12  14 R  Creatinine, Ser 0.44 - 1.00 mg/dL 1.00  1.00  0.97 R, CM 1.06High   0.99 R, CM  Calcium 8.9 - 10.3 mg/dL 8.6Low   8.5Low   9.6 R 9.0  9.4 R  GFR calc non Af Amer >60 mL/min 58Low   58Low   61 R 54Low   60 R  GFR calc Af Amer >60 mL/min >60  >60 CM 71 R >60 CM 69 R  Comment: (NOTE)  The eGFR has been calculated using the CKD EPI equation.

## 2018-04-06 NOTE — Progress Notes (Signed)
*  PRELIMINARY RESULTS* Echocardiogram 2D Echocardiogram has been performed.  Leavy Cella 04/06/2018, 10:42 AM

## 2018-04-06 NOTE — Progress Notes (Signed)
Inpatient Diabetes Program Recommendations  AACE/ADA: New Consensus Statement on Inpatient Glycemic Control (2019)  Target Ranges:  Prepandial:   less than 140 mg/dL      Peak postprandial:   less than 180 mg/dL (1-2 hours)      Critically ill patients:  140 - 180 mg/dL   Results for [name], [name] (MRN [id number]) as of 04/06/2018 08:01  Ref. Range 04/05/2018 07:58 04/05/2018 11:35 04/05/2018 16:25 04/05/2018 21:42 04/06/2018 07:29  Glucose-Capillary Latest Ref Range: 70 - 99 mg/dL 235 (H) 217 (H) 269 (H) 148 (H) 244 (H)   Review of Glycemic Control  Diabetes history: DM2 Outpatient Diabetes medications: Glipizide 12.5 mg QAM Current orders for Inpatient glycemic control: Novolog 0-15 units TID with meals, Novolog 0-5 units QHS  Inpatient Diabetes Program Recommendations:  Insulin - Basal: Please consider ordering Levemir 5 units Q24H.  Thanks, Barnie Alderman, RN, MSN, CDE Diabetes Coordinator Inpatient Diabetes Program [phone] (Team Pager from 8am to 5pm)

## 2018-04-07 ENCOUNTER — Telehealth: Payer: Self-pay

## 2018-04-07 NOTE — Telephone Encounter (Signed)
Transition Care Management Follow-up Telephone Call   Date discharged?        04-06-18        How have you been since you were released from the hospital?    Do you understand why you were in the hospital?    Do you understand the discharge instructions?   Where were you discharged to? Home     Items Reviewed:  Medications reviewed:   Allergies reviewed:   Dietary changes reviewed: Heart healthy low sodium  Referrals reviewed:    Functional Questionnaire:   Activities of Daily Living (ADLs):      Any transportation issues/concerns?:    Any patient concerns?    Confirmed importance and date/time of follow-up visits scheduled      Confirmed with patient if condition begins to worsen call PCP or go to the ER.  Patient was given the office number and encouraged to call back with question or concerns.  :

## 2018-04-07 NOTE — Telephone Encounter (Signed)
Transition Care Management Follow-up Telephone Call   Date discharged?   04-06-18             How have you been since you were released from the hospital? Feeling tired and like she can no longer care for herself    Do you understand why you were in the hospital? Yes she understands   Do you understand the discharge instructions? Yes    Where were you discharged to? Home    Items Reviewed:  Medications reviewed: yes discontinued clonidine, lopresser ordered  Allergies reviewed: Yes no changes   Dietary changes reviewed: heart healthy low sodium   Referrals reviewed: None    Functional Questionnaire:   Activities of Daily Living (ADLs):   Having trouble with ADL's, wants to go to a nursing home    Any transportation issues/concerns?:Brother in law taking her to appointments, doctor advised her no to drive    Any patient concerns?Wants to go to a nursing home not able to care for herself    Confirmed importance and date/time of follow-up visits scheduled Appointment scheduled      Confirmed with patient if condition begins to worsen call PCP or go to the ER.  Patient was given the office number and encouraged to call back with question or concerns.  :

## 2018-04-09 DIAGNOSIS — J9611 Chronic respiratory failure with hypoxia: Secondary | ICD-10-CM | POA: Diagnosis not present

## 2018-04-09 DIAGNOSIS — M6281 Muscle weakness (generalized): Secondary | ICD-10-CM | POA: Diagnosis not present

## 2018-04-09 DIAGNOSIS — E1165 Type 2 diabetes mellitus with hyperglycemia: Secondary | ICD-10-CM | POA: Diagnosis not present

## 2018-04-09 DIAGNOSIS — J441 Chronic obstructive pulmonary disease with (acute) exacerbation: Secondary | ICD-10-CM | POA: Diagnosis not present

## 2018-04-09 DIAGNOSIS — F418 Other specified anxiety disorders: Secondary | ICD-10-CM | POA: Diagnosis not present

## 2018-04-09 DIAGNOSIS — E1121 Type 2 diabetes mellitus with diabetic nephropathy: Secondary | ICD-10-CM | POA: Diagnosis not present

## 2018-04-14 DIAGNOSIS — M6281 Muscle weakness (generalized): Secondary | ICD-10-CM | POA: Diagnosis not present

## 2018-04-14 DIAGNOSIS — F418 Other specified anxiety disorders: Secondary | ICD-10-CM | POA: Diagnosis not present

## 2018-04-14 DIAGNOSIS — J9611 Chronic respiratory failure with hypoxia: Secondary | ICD-10-CM | POA: Diagnosis not present

## 2018-04-14 DIAGNOSIS — J441 Chronic obstructive pulmonary disease with (acute) exacerbation: Secondary | ICD-10-CM | POA: Diagnosis not present

## 2018-04-14 DIAGNOSIS — E1121 Type 2 diabetes mellitus with diabetic nephropathy: Secondary | ICD-10-CM | POA: Diagnosis not present

## 2018-04-14 DIAGNOSIS — E1165 Type 2 diabetes mellitus with hyperglycemia: Secondary | ICD-10-CM | POA: Diagnosis not present

## 2018-04-20 ENCOUNTER — Ambulatory Visit (INDEPENDENT_AMBULATORY_CARE_PROVIDER_SITE_OTHER): Payer: Medicare Other | Admitting: Family Medicine

## 2018-04-20 ENCOUNTER — Encounter: Payer: Self-pay | Admitting: Family Medicine

## 2018-04-20 ENCOUNTER — Ambulatory Visit: Payer: Self-pay

## 2018-04-20 VITALS — BP 118/80 | HR 87 | Temp 98.1°F | Resp 15 | Ht 63.0 in | Wt 204.8 lb

## 2018-04-20 DIAGNOSIS — Z09 Encounter for follow-up examination after completed treatment for conditions other than malignant neoplasm: Secondary | ICD-10-CM | POA: Diagnosis not present

## 2018-04-20 DIAGNOSIS — E1165 Type 2 diabetes mellitus with hyperglycemia: Secondary | ICD-10-CM | POA: Diagnosis not present

## 2018-04-20 DIAGNOSIS — IMO0002 Reserved for concepts with insufficient information to code with codable children: Secondary | ICD-10-CM

## 2018-04-20 DIAGNOSIS — Z23 Encounter for immunization: Secondary | ICD-10-CM | POA: Diagnosis not present

## 2018-04-20 DIAGNOSIS — E1121 Type 2 diabetes mellitus with diabetic nephropathy: Secondary | ICD-10-CM | POA: Diagnosis not present

## 2018-04-20 DIAGNOSIS — I10 Essential (primary) hypertension: Secondary | ICD-10-CM

## 2018-04-20 NOTE — Patient Instructions (Addendum)
F/u in early January, call if youn need me ebfore  CBC, chem 7 and EGFr, and HBA1C today  We will reach out ot [facility] per your request for long term living

## 2018-04-21 ENCOUNTER — Other Ambulatory Visit: Payer: Self-pay | Admitting: Family Medicine

## 2018-04-21 DIAGNOSIS — J9611 Chronic respiratory failure with hypoxia: Secondary | ICD-10-CM | POA: Diagnosis not present

## 2018-04-21 DIAGNOSIS — M6281 Muscle weakness (generalized): Secondary | ICD-10-CM | POA: Diagnosis not present

## 2018-04-21 DIAGNOSIS — J441 Chronic obstructive pulmonary disease with (acute) exacerbation: Secondary | ICD-10-CM | POA: Diagnosis not present

## 2018-04-21 DIAGNOSIS — E1121 Type 2 diabetes mellitus with diabetic nephropathy: Secondary | ICD-10-CM | POA: Diagnosis not present

## 2018-04-21 DIAGNOSIS — E1165 Type 2 diabetes mellitus with hyperglycemia: Secondary | ICD-10-CM | POA: Diagnosis not present

## 2018-04-21 DIAGNOSIS — F418 Other specified anxiety disorders: Secondary | ICD-10-CM | POA: Diagnosis not present

## 2018-04-21 LAB — CBC
HCT: 42 % (ref 35.0–45.0)
HEMOGLOBIN: 13.9 g/dL (ref 11.7–15.5)
MCH: 29.5 pg (ref 27.0–33.0)
MCHC: 33.1 g/dL (ref 32.0–36.0)
MCV: 89.2 fL (ref 80.0–100.0)
MPV: 10 fL (ref 7.5–12.5)
Platelets: 302 10*3/uL (ref 140–400)
RBC: 4.71 10*6/uL (ref 3.80–5.10)
RDW: 13.1 % (ref 11.0–15.0)
WBC: 11.2 10*3/uL — AB (ref 3.8–10.8)

## 2018-04-21 LAB — HEMOGLOBIN A1C
Hgb A1c MFr Bld: 12.8 % of total Hgb — ABNORMAL HIGH (ref <5.7)
Mean Plasma Glucose: 321 (calc)
eAG (mmol/L): 17.8 (calc)

## 2018-04-21 LAB — BASIC METABOLIC PANEL WITH GFR
BUN / CREAT RATIO: 15 (calc) (ref 6–22)
BUN: 17 mg/dL (ref 7–25)
CO2: 30 mmol/L (ref 20–32)
CREATININE: 1.17 mg/dL — AB (ref 0.50–0.99)
Calcium: 9 mg/dL (ref 8.6–10.4)
Chloride: 99 mmol/L (ref 98–110)
GFR, EST NON AFRICAN AMERICAN: 49 mL/min/{1.73_m2} — AB (ref >=60)
GFR, Est African American: 57 mL/min/{1.73_m2} — ABNORMAL LOW (ref >=60)
Glucose, Bld: 313 mg/dL — ABNORMAL HIGH (ref 65–99)
Potassium: 4.1 mmol/L (ref 3.5–5.3)
SODIUM: 141 mmol/L (ref 135–146)

## 2018-04-21 MED ORDER — GLIPIZIDE ER 10 MG PO TB24: ORAL_TABLET | ORAL | 5 refills | Status: AC

## 2018-04-23 ENCOUNTER — Encounter (HOSPITAL_COMMUNITY): Payer: Self-pay | Admitting: Psychiatry

## 2018-04-23 ENCOUNTER — Ambulatory Visit (INDEPENDENT_AMBULATORY_CARE_PROVIDER_SITE_OTHER): Payer: Medicare Other | Admitting: Psychiatry

## 2018-04-23 VITALS — BP 159/9 | HR 90 | Ht 63.0 in | Wt 203.0 lb

## 2018-04-23 DIAGNOSIS — F333 Major depressive disorder, recurrent, severe with psychotic symptoms: Secondary | ICD-10-CM

## 2018-04-23 MED ORDER — ZOLPIDEM TARTRATE 10 MG PO TABS: ORAL_TABLET | ORAL | 2 refills | Status: AC

## 2018-04-23 MED ORDER — ARIPIPRAZOLE 2 MG PO TABS: 2.0000 mg | ORAL_TABLET | Freq: Every day | ORAL | 2 refills | Status: AC

## 2018-04-23 MED ORDER — ALPRAZOLAM 1 MG PO TABS: ORAL_TABLET | ORAL | 2 refills | Status: AC

## 2018-04-23 MED ORDER — FLUOXETINE HCL 20 MG PO CAPS: ORAL_CAPSULE | ORAL | 2 refills | Status: AC

## 2018-04-23 NOTE — Progress Notes (Signed)
[facility] MD/PA/NP OP Progress Note  04/23/2018 10:42 AM [name]  MRN:  [id number]  Chief Complaint:  Chief Complaint    Depression; Anxiety; Follow-up     HPI: this patient is a 67 year old widowed black female who liveswith her [place]. She has no children. She is on disability.  The patient was referred by Dr. Moshe Cipro her primary care physician and Dr. Jefm Miles her psychologist for further evaluation and treatment of major depression and anxiety.  The patient states that she's been depressed since her teen years. She's not really sure why. When she was in college she went through severe depression and had to quit. She's had this on and off through the years but really not receive treatment until a couple years ago after her husband died. Her primary care physician has put her on Prozac and Xanax which have helped to some degree.however recently she has been getting more depressed. She has severe COPD and is on oxygen. Yet, she continues to smoke THREE PACKS PER DAY which she claims is secondary to her depressed mood.  Recently she's gone through more deaths including her sister in 2014 a niece in 2014. Another sister was recently diagnosed with cancer. The patient has gotten overwhelmed by all this and claims it's getting hard for her to just get out of bed and function. She has no energy, she's been shaky and having tremors in her hands, she complains of short-term memory loss.her sleep is variable and Ambien sometimes helps. Her appetite is up and down. Sometimes she hears the voices of her deceased relatives or "feels her spirits." She's never been to a psychiatric hospitalization But has been seeing Dr. Jefm Miles in our office for several years. She has never been suicidal and states she is very "spiritual and values life. She stays away from people but she has a god niece and God sister who check on her faithfully.she denies any other psychotic symptoms and does not use  drugs or alcohol   The patient returns after 3 months.  Her health has really declined.  She is been hospitalized twice, once for COPD exacerbation and once for ventricular tachycardia.  She knows for sure now that she has a lung tumor but she is chosen to only receive palliative care.  She is also made the decision to go into a skilled nursing facility.  She is waiting to hear which when she is going to get into.  She is doing this to make things easier for her family.  Despite all these changes her mood has been pretty stable.  She states that "either way I will be okay."  She thinks that when she dies she will be going to heaven and therefore has nothing to fear.  The patient is sleeping fairly well with Ambien and states her mood and anxiety are under good control.  Unfortunately she continues to smoke about 1/2 pack/day.  She denies any suicidal ideation. Visit Diagnosis:    ICD-10-CM   1. Severe recurrent major depressive disorder with psychotic features ([place]) F33.3     Past Psychiatric History: none  Past Medical History:  Past Medical History:  Diagnosis Date  . Anxiety   . COPD (chronic obstructive pulmonary disease) ([place])   . Depression 2008  . Diabetes mellitus, type 1 1983   type 2 diabetes  . DJD (degenerative joint disease)    of the spine   . GERD (gastroesophageal reflux disease)   . Hyperlipidemia 2007  . Hypertension 1983  .  Hypothyroidism 2004   left thyroidectomy  . Insomnia   . Obesity     Past Surgical History:  Procedure Laterality Date  . APPENDECTOMY    . bilatal great toenail removed , complicated by MRSA in rigt toenail  May 2011  . BREAST EXCISIONAL BIOPSY Right    20+ yrs ago  . TONSILLECTOMY  childhood   . TOTAL ABDOMINAL HYSTERECTOMY W/ BILATERAL SALPINGOOPHORECTOMY    . tracheotomy secondary to failed attempt and lung biopsy      Family Psychiatric History: See below  Family History:  Family History  Problem Relation Age of Onset  .  Hypertension Mother   . Stroke Mother   . Cancer Father        lung   . Hypertension Sister   . Depression Sister   . Alcohol abuse Sister   . Hypertension Sister   . Depression Sister   . Cancer Sister   . Diabetes Sister   . Depression Sister   . Cancer Sister        renal   . Kidney failure Brother   . Kidney failure Unknown        family history   . Hypertension Unknown        family history     Social History:  Social History   Socioeconomic History  . Marital status: Widowed    Spouse name: Not on file  . Number of children: Not on file  . Years of education: Not on file  . Highest education level: Not on file  Occupational History  . Occupation: disable   Social Needs  . Financial resource strain: Not on file  . Food insecurity:    Worry: Not on file    Inability: Not on file  . Transportation needs:    Medical: Not on file    Non-medical: Not on file  Tobacco Use  . Smoking status: Current Every Day Smoker    Packs/day: 0.50    Years: 44.00    Pack years: 22.00    Types: Cigarettes  . Smokeless tobacco: Never Used  . Tobacco comment: current 15/ day, would like to smoke 7/ day  Substance and Sexual Activity  . Alcohol use: No  . Drug use: No  . Sexual activity: Not Currently  Lifestyle  . Physical activity:    Days per week: Not on file    Minutes per session: Not on file  . Stress: Not on file  Relationships  . Social connections:    Talks on phone: Not on file    Gets together: Not on file    Attends religious service: Not on file    Active member of club or organization: Not on file    Attends meetings of clubs or organizations: Not on file    Relationship status: Not on file  Other Topics Concern  . Not on file  Social History Narrative  . Not on file    Allergies: No Active Allergies  Metabolic Disorder Labs: Lab Results  Component Value Date   HGBA1C 12.8 (H) 04/20/2018   MPG 321 04/20/2018   MPG 214.47 02/18/2018   No  results found for: PROLACTIN Lab Results  Component Value Date   CHOL 133 11/09/2017   TRIG 83 11/09/2017   HDL 55 11/09/2017   CHOLHDL 2.4 11/09/2017   VLDL 15 01/21/2017   LDLCALC 61 11/09/2017   LDLCALC 53 01/21/2017   Lab Results  Component Value Date   TSH 1.879  04/06/2018   TSH 2.34 11/09/2017    Therapeutic Level Labs: No results found for: LITHIUM No results found for: VALPROATE No components found for:  CBMZ  Current Medications: Current Outpatient Medications  Medication Sig Dispense Refill  . acetaminophen (TYLENOL) 325 MG tablet Take 650 mg by mouth every 6 (six) hours as needed.    . ALPRAZolam (XANAX) 1 MG tablet TAKE 1 TABLET BY MOUTH FOUR TIMES DAILY 120 tablet 2  . ARIPiprazole (ABILIFY) 2 MG tablet Take 1 tablet (2 mg total) by mouth daily. 90 tablet 2  . aspirin (ASPIRIN LOW DOSE) 81 MG EC tablet Take 81 mg by mouth daily.      . calcium carbonate (TUMS) 500 MG chewable tablet Chew 1 tablet by mouth as needed.      . cloNIDine (CATAPRES) 0.2 MG tablet One tablet at bedtime for blood pressure 90 tablet 3  . clotrimazole-betamethasone (LOTRISONE) cream APPLY TO THE AFFECTED AREA TWICE DAILY 45 g 0  . esomeprazole (NEXIUM) 40 MG capsule TAKE 1 CAPSULE BY MOUTH EVERY MORNING 90 capsule 0  . ferrous sulfate 325 (65 FE) MG tablet Take 325 mg by mouth every other day.     Marland Kitchen FLUoxetine (PROZAC) 20 MG capsule Take two in the am and one in the evening 270 capsule 2  . Fluticasone-Salmeterol (ADVAIR DISKUS) 250-50 MCG/DOSE AEPB inhale 1 dose by mouth twice a day 60 each 4  . folic acid (FOLVITE) 703 MCG tablet Take 400 mcg by mouth daily.    . furosemide (LASIX) 40 MG tablet TAKE 1 TABLET BY MOUTH DAILY 30 tablet 3  . gabapentin (NEURONTIN) 300 MG capsule TAKE ONE CAPSULE BY MOUTH AT BEDTIME 30 capsule 2  . glipiZIDE (GLUCOTROL XL) 10 MG 24 hr tablet Take two tablets once daily with breakfast 60 tablet 5  . guaiFENesin (MUCINEX) 600 MG 12 hr tablet Take 1 tablet (600  mg total) by mouth 2 (two) times daily. 60 tablet 2  . Insulin Pen Needle 31G X 5 MM MISC Use once daily to inject insulin dx E11.65 50 each 5  . ipratropium-albuterol (DUONEB) 0.5-2.5 (3) MG/3ML SOLN Take 3 mLs by nebulization 4 (four) times daily as needed.     . levalbuterol (XOPENEX HFA) 45 MCG/ACT inhaler Inhale 1-2 puffs into the lungs as needed.      Marland Kitchen levothyroxine (SYNTHROID, LEVOTHROID) 100 MCG tablet TAKE 1 TABLET ONCE DAILY 90 tablet 1  . lisinopril (PRINIVIL,ZESTRIL) 5 MG tablet Take 1 tablet (5 mg total) by mouth daily. 90 tablet 1  . metoprolol tartrate (LOPRESSOR) 25 MG tablet Take 1 tablet (25 mg total) by mouth 2 (two) times daily. 60 tablet 0  . montelukast (SINGULAIR) 10 MG tablet TAKE 1 TABLET BY MOUTH AT BEDTIME 90 tablet 0  . oxybutynin (DITROPAN) 5 MG tablet Take 1 tablet (5 mg total) by mouth every 8 (eight) hours as needed for bladder spasms. 60 tablet 0  . rosuvastatin (CRESTOR) 20 MG tablet TAKE 1 TABLET ONCE DAILY 90 tablet 0  . Vitamin D, Ergocalciferol, (DRISDOL) 50000 units CAPS capsule TAKE 1 CAPSULE BY MOUTH ONCE WEEKLY 4 capsule 3  . zolpidem (AMBIEN) 10 MG tablet TAKE 1 TABLET(10 MG) BY MOUTH AT BEDTIME 30 tablet 2   No current facility-administered medications for this visit.      Musculoskeletal: Strength & Muscle Tone: within normal limits Gait & Station: normal, unsteady Patient leans: N/A  Psychiatric Specialty Exam: Review of Systems  Constitutional: Positive for malaise/fatigue.  Respiratory:  Positive for shortness of breath.   Neurological: Positive for weakness.  All other systems reviewed and are negative.   Blood pressure (!) 159/9, pulse 90, height 5\' 3"  (1.6 m), weight 203 lb (92.1 kg), SpO2 93 %.Body mass index is 35.96 kg/m.  General Appearance: Casual and Fairly Groomed  Eye Contact:  Good  Speech:  Clear and Coherent  Volume:  Decreased  Mood:  Euthymic  Affect:  Appropriate and Congruent  Thought Process:  Goal Directed   Orientation:  Full (Time, Place, and Person)  Thought Content: Rumination   Suicidal Thoughts:  No  Homicidal Thoughts:  No  Memory:  Immediate;   Good Recent;   Good Remote;   Fair  Judgement:  Good  Insight:  Fair  Psychomotor Activity:  Decreased  Concentration:  Concentration: Fair and Attention Span: Fair  Recall:  [employer] of Knowledge: Fair  Language: Good  Akathisia:  No  Handed:  Right  AIMS (if indicated): not done  Assets:  Communication Skills Resilience Social Support  ADL's:  Intact  Cognition: WNL  Sleep:  Fair   Screenings: Mini-Mental     Office Visit from 12/28/2014 in [facility]  Total Score (max 30 points )  24    PHQ2-9     Office Visit from 03/02/2018 in [facility] Patient Outreach Telephone from 02/26/2018 in [facility] Visit from 11/24/2017 in [facility] Office Visit from 07/01/2016 in [facility] Office Visit from 12/28/2015 in [facility]  PHQ-2 Total Score  0  0  4  0  2  PHQ-9 Total Score  -  -  17  -  18       Assessment and Plan: She is a 67 year old female with severe COPD lung cancer depression and anxiety.  She realizes she is nearing the end but is made appropriate decisions for her care.  She seems to be thinking clearly and appropriately.  She does think her medications have been helpful for her mood and anxiety.  She will continue Prozac 40 mg every morning and 20 mg nightly for depression, Abilify 2 mg daily for augmentation, Xanax 1 mg 4 times daily for anxiety and Ambien 10 mg at bedtime for sleep.  She will return to see me in 3 months   Levonne Spiller, MD 04/23/2018, 10:42 AM

## 2018-05-01 ENCOUNTER — Encounter: Payer: Self-pay | Admitting: Family Medicine

## 2018-05-01 NOTE — Progress Notes (Signed)
   [name]     MRN: [id number]      DOB: Nov 10, 1950   HPI Ms. [name] is here for follow up of recent hospitalization from 11/03 to 04/06/2018 when she was presented with chest pain, but was noted to be in SVT with a HR in the 180's associated with chest pressure. She responded to adenosine, and is referred to out patient palliative services, as she ise is DNR and has multiple severe medical problems including respiratory failure, severe pulmonary hypertension, newly diagnosed lung mass,  Thought to be due to malignancy , and uncontrolled diabetes At the visit patient states she is at a point where she realizes that she is incapable of caring for herself and living independently and she wants to go to a Skilled Nursing facility, she has actually identified [facility] as the place that she wants to go to. I absolutely do agree that she is incapable of independent living, even personal care like bating herself is increasingly difficult for her , and medication management , preparing her food  is more than she is able to handle, we will do as much as we can to help wth placement. Palliative care has been encouraged from the Hospital end , which is also very appropriate ,however , she states she is not  "ready for this"   ROS See HPI  Denies recent fever or chills. Denies sinus pressure, nasal congestion, ear pain or sore throat. chronic shortness of breath and wheezing, oxygen dependent   Denies abdominal pain, nausea, vomiting,diarrhea or constipation.   Denies dysuria, frequency, hesitancy or incontinence. C/o chronic joint pain, swelling and limitation in mobility. Denies headaches, seizures, numbness, or tingling. Denies uncontrolled depression, does have increased anxiety  Denies skin break down or rash.   PE  BP 118/80   Pulse 87   Temp 98.1 F (36.7 C) (Oral)   Resp 15   Ht 5\' 3"  (1.6 m)   Wt 204 lb 12.8 oz (92.9 kg)   SpO2 96% Comment: 4 liters oxygen  BMI 36.28  kg/m    Patient alert and oriented, chronically ill appearing, appears older than stated age  HEENT: No facial asymmetry, EOMI,   oropharynx pink and moist.  Neck supple no JVD, no mass.  Chest: decreased air entry bilateral crackles and wheezes  CVS: S1, S2 no murmurs, no S3.Regular rate.  ABD: Soft non tender.   Ext: No edema  [id number] ROM ROM spine, shoulders, hips and knees.  Skin: Intact, no ulcerations or rash noted.  Psych: Good eye contact,. Memory impaired , both anxious and  depressed appearing.  CNS: CN 2-12 intact, power,  normal throughout.no focal deficits noted.   [facility] discharge follow-up Hospital course reviewed  With patient. She states categorically that she now accepts the fact that she is incapable of living independently and wants help to be placed in a SNF, and has identified the one she is  Most interested. Medically , she certainly qualifies for and needs this and I have advised that I will assist in any way that I am able to as far as placement is concerned Recommended labs are ordered per d/c summary, and I have advised her of the need to f/u with Pulmonary also

## 2018-05-01 NOTE — Assessment & Plan Note (Signed)
Deteriorated, pt encouraged to work on this Ms. [name] is reminded of the importance of commitment to daily physical activity for 30 minutes or more, as able and the need to limit carbohydrate intake to 30 to 60 grams per meal to help with blood sugar control.   The need to take medication as prescribed, test blood sugar as directed, and to call between visits if there is a concern that blood sugar is uncontrolled is also discussed.   Ms. [name] is reminded of the importance of daily foot exam, annual eye examination, and good blood sugar, blood pressure and cholesterol control.  Diabetic Labs Latest Ref Rng & Units 04/20/2018 04/05/2018 04/05/2018 03/02/2018 02/18/2018  HbA1c <5.7 % of total Hgb 12.8(H) - - - 9.1(H)  Microalbumin Not estab mg/dL - - - - -  Micro/Creat Ratio <30 mcg/mg creat - - - - -  Chol <200 mg/dL - - - - -  HDL >50 mg/dL - - - - -  Calc LDL mg/dL (calc) - - - - -  Triglycerides <150 mg/dL - - - - -  Creatinine 0.50 - 0.99 mg/dL 1.17(H) 1.00 1.00 0.97 1.06(H)   BP/Weight 04/20/2018 04/06/2018 03/02/2018 02/20/2018 02/18/2018 11/24/2017 08/29/760  Systolic BP 263 92 335 456 - 256 389  Diastolic BP 80 41 62 96 - 84 78  Wt. (Lbs) 204.8 207.45 213.04 - 224.65 223 226  BMI 36.28 36.75 37.74 - 39.79 39.5 40.03  Some encounter information is confidential and restricted. Go to Review Flowsheets activity to see all data.   Foot/eye exam completion dates Latest Ref Rng & Units 11/24/2017 07/01/2016  Eye Exam No Retinopathy - -  Foot exam Order - - -  Foot Form Completion - Done Done

## 2018-05-01 NOTE — Assessment & Plan Note (Signed)
Hospital course reviewed , in particular the new and challenging dx of a lung mass on chest scan which patient is grappling with the challenge of the way she wants to move forward with this Breathing is improved and back to baseline F/u with her Pulmonary Doc to further discuss possible bronchoscopy is arranged She is strongly encouraged to be diligent with her food choice and diabetes management to improve her health, and labs are ordered as per discharge summary recommendatio

## 2018-05-01 NOTE — Progress Notes (Signed)
[name]     MRN: [id number]      DOB: Jul 11, 1950   HPI [name] is here for follow up of recent hospitalization from 09/19 to 02/20/2018 with a main dx of COPD exacerbation Breathing is back to baseline. The new challenge is that [name] now has a known lung mass which is suspicious for lung cancer, [name] has always stated that "if I have cancer I do not want to know, because I do not want treatment" and has been DNR for years. The reality also is that [name] recently lost 1 sister after a long battle with lung cancer and her remaining sister has recently been diagnosed with lung cancer. Now that [name] herself is facing that possible reality for herself [name] appears not to be quite as certain as to the direction that [name] really wants to take and is reaching out for my guidance. I advise that with treatments offered cure and /or palliative care are both reasonable options, and that [name] best can determine what [name] really does want , but that [name] should at least get a definitive dx made to give herself a real choice, which is what [name] appears to want to move forward with Reports being non compliant with diet and medications for her diabetes, as a result health has deteriorated, needs to change this ROS Denies chest pains, palpitations and leg swelling Denies abdominal pain, nausea, vomiting,diarrhea or constipation.   Denies dysuria, frequency, hesitancy or incontinence. C/o chronic  joint pain, swelling and limitation in mobility. Denies headaches, seizures, numbness, or tingling. Denies  Uncontrolled depression, anxiety or insomnia. Denies skin break down or rash.   PE  BP 124/62 (BP Location: Right Arm, Patient Position: Sitting, Cuff Size: Large)   Pulse 61   Resp 12   Ht 5\' 3"  (1.6 m)   Wt 213 lb 0.6 oz (96.6 kg)   SpO2 93% Comment: with 4 lpm supplemental oxygen  BMI 37.74 kg/m   Patient alert and oriented   HEENT: No facial asymmetry, EOMI,   oropharynx pink and moist.  Neck  supple no JVD, no mass.  Chest: decreased air entry, scattered wheezes , few crackles  CVS: S1, S2 no murmurs, no S3.Regular rate.  ABD: Soft non tender.   Ext: No edema  MS: decreased  ROM spine, shoulders, hips and knees.  Skin: Intact, no ulcerations or rash noted.  Psych: Good eye contact, . Memory impaired  not anxious or depressed appearing.  CNS: CN 2-12 intact, power,  normal throughout.no focal deficits noted.   [facility] discharge follow-up  Hospital course reviewed , in particular the new and challenging dx of a lung mass on chest scan which patient is grappling with the challenge of the way [name] wants to move forward with this Breathing is improved and back to baseline F/u with her Pulmonary Doc to further discuss possible bronchoscopy is arranged [name] is strongly encouraged to be diligent with her food choice and diabetes management to improve her health, and labs are ordered as per discharge summary recommendatio   Uncontrolled diabetes mellitus with diabetic nephropathy ([place]) Deteriorated, pt encouraged to work on this [name] is reminded of the importance of commitment to daily physical activity for 30 minutes or more, as able and the need to limit carbohydrate intake to 30 to 60 grams per meal to help with blood sugar control.   The need to take medication as prescribed, test blood sugar as directed, and to  call between visits if there is a concern that blood sugar is uncontrolled is also discussed.   [name] is reminded of the importance of daily foot exam, annual eye examination, and good blood sugar, blood pressure and cholesterol control.  Diabetic Labs Latest Ref Rng & Units 04/20/2018 04/05/2018 04/05/2018 03/02/2018 02/18/2018  HbA1c <5.7 % of total Hgb 12.8(H) - - - 9.1(H)  Microalbumin Not estab mg/dL - - - - -  Micro/Creat Ratio <30 mcg/mg creat - - - - -  Chol <200 mg/dL - - - - -  HDL >50 mg/dL - - - - -  Calc LDL mg/dL  (calc) - - - - -  Triglycerides <150 mg/dL - - - - -  Creatinine 0.50 - 0.99 mg/dL 1.17(H) 1.00 1.00 0.97 1.06(H)   BP/Weight 04/20/2018 04/06/2018 03/02/2018 02/20/2018 02/18/2018 11/24/2017 0/96/4383  Systolic BP 818 92 403 754 - 360 677  Diastolic BP 80 41 62 96 - 84 78  Wt. (Lbs) 204.8 207.45 213.04 - 224.65 223 226  BMI 36.28 36.75 37.74 - 39.79 39.5 40.03  Some encounter information is confidential and restricted. Go to Review Flowsheets activity to see all data.   Foot/eye exam completion dates Latest Ref Rng & Units 11/24/2017 07/01/2016  Eye Exam No Retinopathy - -  Foot exam Order - - -  Foot Form Completion - Done Done

## 2018-05-01 NOTE — Assessment & Plan Note (Signed)
Hospital course reviewed  With patient. She states categorically that she now accepts the fact that she is incapable of living independently and wants help to be placed in a SNF, and has identified the one she is  Most interested. Medically , she certainly qualifies for and needs this and I have advised that I will assist in any way that I am able to as far as placement is concerned Recommended labs are ordered per d/c summary, and I have advised her of the need to f/u with Pulmonary also

## 2018-05-03 ENCOUNTER — Telehealth: Payer: Self-pay | Admitting: *Deleted

## 2018-05-03 NOTE — Telephone Encounter (Signed)
Pt stated she needs an FL2 to be sent to [facility]. She would like a return call.

## 2018-05-03 NOTE — Telephone Encounter (Signed)
Advised her I had completed the fl2 and was waiting for a signature to fax it. She said they were supposed to be sending [place] some forms to complete but told her I hadn't seen anything yet. She will call back to check status tomorrow

## 2018-05-05 NOTE — Telephone Encounter (Signed)
I called and spoke with Lynnae Sandhoff at [facility] and she had just received the [facility] and she asked for the last 3 office notes and H&P. All info faxed over to her. She said nothing had been faxed to [place] on their end and she thought she had everything from [place] that they needed. Please let Aria know

## 2018-05-05 NOTE — Telephone Encounter (Signed)
Attempted to call pt back with no answer and no way to leave a voicemail

## 2018-05-06 ENCOUNTER — Telehealth: Payer: Self-pay | Admitting: *Deleted

## 2018-05-06 NOTE — Telephone Encounter (Signed)
error 

## 2018-05-13 NOTE — Telephone Encounter (Signed)
Advised pt that [facility] has everything they need, per Pt admission date 05-31-18

## 2018-06-06 ENCOUNTER — Emergency Department (HOSPITAL_COMMUNITY): Payer: Medicare Other

## 2018-06-06 ENCOUNTER — Other Ambulatory Visit: Payer: Self-pay

## 2018-06-06 ENCOUNTER — Inpatient Hospital Stay (HOSPITAL_COMMUNITY)
Admission: EM | Admit: 2018-06-06 | Discharge: 2018-07-03 | DRG: 871 | Disposition: E | Payer: Medicare Other | Source: Skilled Nursing Facility | Attending: Internal Medicine | Admitting: Internal Medicine

## 2018-06-06 ENCOUNTER — Encounter (HOSPITAL_COMMUNITY): Payer: Self-pay | Admitting: *Deleted

## 2018-06-06 DIAGNOSIS — R4020 Unspecified coma: Secondary | ICD-10-CM | POA: Diagnosis not present

## 2018-06-06 DIAGNOSIS — I4581 Long QT syndrome: Secondary | ICD-10-CM | POA: Diagnosis not present

## 2018-06-06 DIAGNOSIS — F17210 Nicotine dependence, cigarettes, uncomplicated: Secondary | ICD-10-CM | POA: Diagnosis present

## 2018-06-06 DIAGNOSIS — K219 Gastro-esophageal reflux disease without esophagitis: Secondary | ICD-10-CM | POA: Diagnosis present

## 2018-06-06 DIAGNOSIS — E785 Hyperlipidemia, unspecified: Secondary | ICD-10-CM | POA: Diagnosis present

## 2018-06-06 DIAGNOSIS — A419 Sepsis, unspecified organism: Principal | ICD-10-CM | POA: Diagnosis present

## 2018-06-06 DIAGNOSIS — Z7951 Long term (current) use of inhaled steroids: Secondary | ICD-10-CM | POA: Diagnosis not present

## 2018-06-06 DIAGNOSIS — I10 Essential (primary) hypertension: Secondary | ICD-10-CM | POA: Diagnosis present

## 2018-06-06 DIAGNOSIS — I248 Other forms of acute ischemic heart disease: Secondary | ICD-10-CM | POA: Diagnosis present

## 2018-06-06 DIAGNOSIS — F329 Major depressive disorder, single episode, unspecified: Secondary | ICD-10-CM | POA: Diagnosis present

## 2018-06-06 DIAGNOSIS — J440 Chronic obstructive pulmonary disease with acute lower respiratory infection: Secondary | ICD-10-CM | POA: Diagnosis present

## 2018-06-06 DIAGNOSIS — N170 Acute kidney failure with tubular necrosis: Secondary | ICD-10-CM | POA: Diagnosis present

## 2018-06-06 DIAGNOSIS — I272 Pulmonary hypertension, unspecified: Secondary | ICD-10-CM | POA: Diagnosis present

## 2018-06-06 DIAGNOSIS — C3490 Malignant neoplasm of unspecified part of unspecified bronchus or lung: Secondary | ICD-10-CM | POA: Diagnosis present

## 2018-06-06 DIAGNOSIS — E161 Other hypoglycemia: Secondary | ICD-10-CM | POA: Diagnosis not present

## 2018-06-06 DIAGNOSIS — J9601 Acute respiratory failure with hypoxia: Secondary | ICD-10-CM | POA: Diagnosis present

## 2018-06-06 DIAGNOSIS — Z823 Family history of stroke: Secondary | ICD-10-CM | POA: Diagnosis not present

## 2018-06-06 DIAGNOSIS — J189 Pneumonia, unspecified organism: Secondary | ICD-10-CM | POA: Diagnosis not present

## 2018-06-06 DIAGNOSIS — R6521 Severe sepsis with septic shock: Secondary | ICD-10-CM | POA: Diagnosis present

## 2018-06-06 DIAGNOSIS — Z79899 Other long term (current) drug therapy: Secondary | ICD-10-CM | POA: Diagnosis not present

## 2018-06-06 DIAGNOSIS — Z833 Family history of diabetes mellitus: Secondary | ICD-10-CM | POA: Diagnosis not present

## 2018-06-06 DIAGNOSIS — F419 Anxiety disorder, unspecified: Secondary | ICD-10-CM | POA: Diagnosis present

## 2018-06-06 DIAGNOSIS — Z7984 Long term (current) use of oral hypoglycemic drugs: Secondary | ICD-10-CM

## 2018-06-06 DIAGNOSIS — E162 Hypoglycemia, unspecified: Secondary | ICD-10-CM | POA: Diagnosis not present

## 2018-06-06 DIAGNOSIS — E1121 Type 2 diabetes mellitus with diabetic nephropathy: Secondary | ICD-10-CM | POA: Diagnosis present

## 2018-06-06 DIAGNOSIS — E872 Acidosis, unspecified: Secondary | ICD-10-CM

## 2018-06-06 DIAGNOSIS — Z8249 Family history of ischemic heart disease and other diseases of the circulatory system: Secondary | ICD-10-CM

## 2018-06-06 DIAGNOSIS — Z515 Encounter for palliative care: Secondary | ICD-10-CM | POA: Diagnosis present

## 2018-06-06 DIAGNOSIS — Z801 Family history of malignant neoplasm of trachea, bronchus and lung: Secondary | ICD-10-CM

## 2018-06-06 DIAGNOSIS — J181 Lobar pneumonia, unspecified organism: Secondary | ICD-10-CM | POA: Diagnosis not present

## 2018-06-06 DIAGNOSIS — Z66 Do not resuscitate: Secondary | ICD-10-CM | POA: Diagnosis present

## 2018-06-06 DIAGNOSIS — R52 Pain, unspecified: Secondary | ICD-10-CM | POA: Diagnosis not present

## 2018-06-06 DIAGNOSIS — Z90710 Acquired absence of both cervix and uterus: Secondary | ICD-10-CM | POA: Diagnosis not present

## 2018-06-06 DIAGNOSIS — R404 Transient alteration of awareness: Secondary | ICD-10-CM | POA: Diagnosis not present

## 2018-06-06 DIAGNOSIS — E1151 Type 2 diabetes mellitus with diabetic peripheral angiopathy without gangrene: Secondary | ICD-10-CM | POA: Diagnosis present

## 2018-06-06 DIAGNOSIS — E890 Postprocedural hypothyroidism: Secondary | ICD-10-CM | POA: Diagnosis present

## 2018-06-06 DIAGNOSIS — Z79890 Hormone replacement therapy (postmenopausal): Secondary | ICD-10-CM

## 2018-06-06 DIAGNOSIS — R0902 Hypoxemia: Secondary | ICD-10-CM | POA: Diagnosis not present

## 2018-06-06 LAB — CBC WITH DIFFERENTIAL/PLATELET
Abs Immature Granulocytes: 0.88 10*3/uL — ABNORMAL HIGH (ref 0.00–0.07)
Basophils Absolute: 0.2 10*3/uL — ABNORMAL HIGH (ref 0.0–0.1)
Basophils Relative: 1 %
Eosinophils Absolute: 0 10*3/uL (ref 0.0–0.5)
Eosinophils Relative: 0 %
HCT: 52.1 % — ABNORMAL HIGH (ref 36.0–46.0)
Hemoglobin: 15.4 g/dL — ABNORMAL HIGH (ref 12.0–15.0)
Immature Granulocytes: 3 %
Lymphocytes Relative: 11 %
Lymphs Abs: 3.4 10*3/uL (ref 0.7–4.0)
MCH: 29.3 pg (ref 26.0–34.0)
MCHC: 29.6 g/dL — ABNORMAL LOW (ref 30.0–36.0)
MCV: 99.2 fL (ref 80.0–100.0)
Monocytes Absolute: 1.9 10*3/uL — ABNORMAL HIGH (ref 0.1–1.0)
Monocytes Relative: 6 %
Neutro Abs: 23.7 10*3/uL — ABNORMAL HIGH (ref 1.7–7.7)
Neutrophils Relative %: 79 %
PLATELETS: 322 10*3/uL (ref 150–400)
RBC: 5.25 MIL/uL — ABNORMAL HIGH (ref 3.87–5.11)
RDW: 14.8 % (ref 11.5–15.5)
WBC: 30.2 10*3/uL — ABNORMAL HIGH (ref 4.0–10.5)
nRBC: 0.3 % — ABNORMAL HIGH (ref 0.0–0.2)

## 2018-06-06 LAB — COMPREHENSIVE METABOLIC PANEL
ALT: 52 U/L — AB (ref 0–44)
AST: 158 U/L — ABNORMAL HIGH (ref 15–41)
Albumin: 2.3 g/dL — ABNORMAL LOW (ref 3.5–5.0)
Alkaline Phosphatase: 163 U/L — ABNORMAL HIGH (ref 38–126)
Anion gap: 29 — ABNORMAL HIGH (ref 5–15)
BUN: 58 mg/dL — ABNORMAL HIGH (ref 8–23)
CO2: 13 mmol/L — ABNORMAL LOW (ref 22–32)
Calcium: 7.6 mg/dL — ABNORMAL LOW (ref 8.9–10.3)
Chloride: 95 mmol/L — ABNORMAL LOW (ref 98–111)
Creatinine, Ser: 5.29 mg/dL — ABNORMAL HIGH (ref 0.44–1.00)
GFR calc Af Amer: 9 mL/min — ABNORMAL LOW (ref >60)
GFR calc non Af Amer: 8 mL/min — ABNORMAL LOW (ref >60)
Glucose, Bld: 116 mg/dL — ABNORMAL HIGH (ref 70–99)
Potassium: 3.5 mmol/L (ref 3.5–5.1)
Sodium: 137 mmol/L (ref 135–145)
Total Bilirubin: 1.4 mg/dL — ABNORMAL HIGH (ref 0.3–1.2)
Total Protein: 5.6 g/dL — ABNORMAL LOW (ref 6.5–8.1)

## 2018-06-06 LAB — BLOOD GAS, ARTERIAL
Acid-base deficit: 15.4 mmol/L — ABNORMAL HIGH (ref 0.0–2.0)
Bicarbonate: 12.5 mmol/L — ABNORMAL LOW (ref 20.0–28.0)
Drawn by: 105551
FIO2: 100
O2 Saturation: 86.3 %
pCO2 arterial: 31.8 mmHg — ABNORMAL LOW (ref 32.0–48.0)
pH, Arterial: 7.178 — CL (ref 7.350–7.450)
pO2, Arterial: 71.6 mmHg — ABNORMAL LOW (ref 83.0–108.0)

## 2018-06-06 LAB — CBG MONITORING, ED
GLUCOSE-CAPILLARY: 106 mg/dL — AB (ref 70–99)
Glucose-Capillary: 112 mg/dL — ABNORMAL HIGH (ref 70–99)
Glucose-Capillary: 93 mg/dL (ref 70–99)

## 2018-06-06 LAB — MAGNESIUM: Magnesium: 2.1 mg/dL (ref 1.7–2.4)

## 2018-06-06 LAB — TROPONIN I
Troponin I: 0.35 ng/mL (ref <0.03)
Troponin I: 0.45 ng/mL (ref <0.03)

## 2018-06-06 LAB — URINALYSIS, ROUTINE W REFLEX MICROSCOPIC
Glucose, UA: NEGATIVE mg/dL
Hgb urine dipstick: NEGATIVE
Ketones, ur: NEGATIVE mg/dL
LEUKOCYTES UA: NEGATIVE
Nitrite: NEGATIVE
Protein, ur: 30 mg/dL — AB
SPECIFIC GRAVITY, URINE: 1.024 (ref 1.005–1.030)
pH: 5 (ref 5.0–8.0)

## 2018-06-06 MED ORDER — METRONIDAZOLE IN NACL 5-0.79 MG/ML-% IV SOLN
500.0000 mg | Freq: Three times a day (TID) | INTRAVENOUS | Status: DC
  Administered 2018-06-06 – 2018-06-07 (×2): 500 mg via INTRAVENOUS
  Filled 2018-06-06 (×2): qty 100

## 2018-06-06 MED ORDER — NALOXONE HCL 0.4 MG/ML IJ SOLN
0.4000 mg | Freq: Once | INTRAMUSCULAR | Status: AC
  Administered 2018-06-06: 0.4 mg via INTRAVENOUS
  Filled 2018-06-06: qty 1

## 2018-06-06 MED ORDER — SODIUM BICARBONATE 8.4 % IV SOLN
INTRAVENOUS | Status: AC
  Filled 2018-06-06: qty 100

## 2018-06-06 MED ORDER — POTASSIUM CHLORIDE IN NACL 20-0.9 MEQ/L-% IV SOLN: INTRAVENOUS | Status: DC

## 2018-06-06 MED ORDER — ONDANSETRON HCL 4 MG PO TABS: 4.0000 mg | ORAL_TABLET | Freq: Four times a day (QID) | ORAL | Status: DC | PRN

## 2018-06-06 MED ORDER — VANCOMYCIN HCL IN DEXTROSE 1-5 GM/200ML-% IV SOLN: 1000.0000 mg | Freq: Once | INTRAVENOUS | Status: DC

## 2018-06-06 MED ORDER — SODIUM CHLORIDE 0.9 % IV SOLN
2.0000 g | Freq: Once | INTRAVENOUS | Status: AC
  Administered 2018-06-06: 2 g via INTRAVENOUS
  Filled 2018-06-06: qty 2

## 2018-06-06 MED ORDER — ONDANSETRON HCL 4 MG/2ML IJ SOLN: 4.0000 mg | Freq: Four times a day (QID) | INTRAMUSCULAR | Status: DC | PRN

## 2018-06-06 MED ORDER — ACETAMINOPHEN 650 MG RE SUPP: 650.0000 mg | Freq: Four times a day (QID) | RECTAL | Status: DC | PRN

## 2018-06-06 MED ORDER — MOMETASONE FURO-FORMOTEROL FUM 200-5 MCG/ACT IN AERO
2.0000 | INHALATION_SPRAY | Freq: Two times a day (BID) | RESPIRATORY_TRACT | Status: DC
  Filled 2018-06-06: qty 8.8

## 2018-06-06 MED ORDER — SODIUM CHLORIDE 0.9 % IV BOLUS (SEPSIS)
1000.0000 mL | Freq: Once | INTRAVENOUS | Status: AC
  Administered 2018-06-06: 1000 mL via INTRAVENOUS

## 2018-06-06 MED ORDER — HEPARIN SODIUM (PORCINE) 5000 UNIT/ML IJ SOLN
5000.0000 [IU] | Freq: Three times a day (TID) | INTRAMUSCULAR | Status: DC
  Administered 2018-06-06 – 2018-06-07 (×2): 5000 [IU] via SUBCUTANEOUS
  Filled 2018-06-06 (×2): qty 1

## 2018-06-06 MED ORDER — IPRATROPIUM-ALBUTEROL 0.5-2.5 (3) MG/3ML IN SOLN
3.0000 mL | Freq: Four times a day (QID) | RESPIRATORY_TRACT | Status: DC
  Administered 2018-06-06 – 2018-06-07 (×3): 3 mL via RESPIRATORY_TRACT
  Filled 2018-06-06 (×3): qty 3

## 2018-06-06 MED ORDER — VANCOMYCIN HCL 10 G IV SOLR
2000.0000 mg | Freq: Once | INTRAVENOUS | Status: AC
  Administered 2018-06-06: 2000 mg via INTRAVENOUS
  Filled 2018-06-06 (×2): qty 2000

## 2018-06-06 MED ORDER — ACETAMINOPHEN 325 MG PO TABS: 650.0000 mg | ORAL_TABLET | Freq: Four times a day (QID) | ORAL | Status: DC | PRN

## 2018-06-06 MED ORDER — SODIUM BICARBONATE 8.4 % IV SOLN
50.0000 meq | Freq: Once | INTRAVENOUS | Status: AC
  Administered 2018-06-06: 50 meq via INTRAVENOUS
  Filled 2018-06-06: qty 50

## 2018-06-06 MED ORDER — SODIUM BICARBONATE 8.4 % IV SOLN
INTRAVENOUS | Status: DC
  Administered 2018-06-07: 01:00:00 via INTRAVENOUS
  Filled 2018-06-06 (×2): qty 100

## 2018-06-06 NOTE — ED Triage Notes (Signed)
Pt brought in by [facility] from [facility] for c/o unresponsiveness; staff reported to ems that pt was her "normal" self today and later this evening pt became unresponsive and only opens eyes to verbal stimuli; pt cbg at facility 56 and 2 glucagon given and ems; cbg with [id number] after amp of D50 given

## 2018-06-06 NOTE — Progress Notes (Signed)
Patient placed on BiPAP for low saturation. Also to give order neb treatments She also has increased wob do to being in metabolic acidosis. BiCarb has not been given yet even though been given much earlier. She is a DNR

## 2018-06-06 NOTE — ED Notes (Addendum)
Notified Sabra Heck, MD of blood sugar. (93)

## 2018-06-06 NOTE — ED Notes (Signed)
Attempted blood cultures twice. Unsuccessful. Lab is in room attempting cultures now.

## 2018-06-06 NOTE — H&P (Signed)
History and Physical    [name] YWV:[id number] DOB: 1950-06-30 DOA: 06/30/2018  PCP: Fayrene Helper, MD   Patient coming from: [facility]   I have personally briefly reviewed patient's old medical records in [place]  Chief Complaint: Unresponsiveness  HPI: [name] is a 68 y.o. female with medical history significant for severe COPD, lung mass, DNR status , DM, thyroidism, obesity, who was brought to the ED after becoming unresponsive.  At the time of my evaluation patient is still unresponsive and unable to give me history.  Patient over the past few weeks has gradually declined, able to care for herself and was subsequently transferred to a nursing facility about a week ago.  Nursing staff found patient unresponsive, hypoglycemic blood sugar 56, and hypoxic.  EMS gave D50 but patient status did not change. Patient has a lung cancer which she has elected not to biopsy or treat.  Patient recently lost 1 of her sisters after a long battle with lung cancer and a second sister was just recently diagnosed with lung cancer.  ED Course: Temperature 97.7, pulse 65, blood pressure systolic 85/46 with respiratory rate of 22-26.  Patient was placed on nonrebreather mask.  Head CT was negative for acute abnormality.  UA-rare bacteria. Troponin elevated 0.35.  Leukocytosis 30.2.  Markedly elevated from baseline 5.29 from baseline ~ 1.  ABG showed pH of 7.1, PCO2 31, PO2 71. Port chest x-ray showed progressive consolidation right upper lobe opacity since radiograph 2 months ago, may represent progression and malignancy versus pneumonia/postobstructive change.  Lactic acid pending blood cultures ordered.  Patient was started on IV vancomycin and cefepime.  Narcan also given in ED without response. Patient is DNR. EDP talked extensively with family they have declined pressors or any other aggressive measures and want IV antibiotics and fluids.  Review of Systems: Unable to obtain due  to to patient's mental status.  Past Medical History:  Diagnosis Date  . Anxiety   . COPD (chronic obstructive pulmonary disease) ([place])   . Depression 2008  . Diabetes mellitus, type 1 1983   type 2 diabetes  . DJD (degenerative joint disease)    of the spine   . GERD (gastroesophageal reflux disease)   . Hyperlipidemia 2007  . Hypertension 1983  . Hypothyroidism 2004   left thyroidectomy  . Insomnia   . Obesity     Past Surgical History:  Procedure Laterality Date  . APPENDECTOMY    . bilatal great toenail removed , complicated by MRSA in rigt toenail  May 2011  . BREAST EXCISIONAL BIOPSY Right    20+ yrs ago  . TONSILLECTOMY  childhood   . TOTAL ABDOMINAL HYSTERECTOMY W/ BILATERAL SALPINGOOPHORECTOMY    . tracheotomy secondary to failed attempt and lung biopsy       reports that she has been smoking cigarettes. She has a 22.00 pack-year smoking history. She has never used smokeless tobacco. She reports that she does not drink alcohol or use drugs.  No Known Allergies  Family History  Problem Relation Age of Onset  . Hypertension Mother   . Stroke Mother   . Cancer Father        lung   . Hypertension Sister   . Depression Sister   . Alcohol abuse Sister   . Hypertension Sister   . Depression Sister   . Cancer Sister   . Diabetes Sister   . Depression Sister   . Cancer Sister  renal   . Kidney failure Brother   . Kidney failure Other        family history   . Hypertension Other        family history      Prior to Admission medications   Medication Sig Start Date End Date Taking? Authorizing Provider  ALPRAZolam (XANAX) 1 MG tablet TAKE 1 TABLET BY MOUTH FOUR TIMES DAILY Patient taking differently: Take 1 mg by mouth 4 (four) times daily.  04/23/18  Yes Cloria Spring, MD  ARIPiprazole (ABILIFY) 2 MG tablet Take 1 tablet (2 mg total) by mouth daily. 04/23/18  Yes Cloria Spring, MD  cetaphil (CETAPHIL) cream Apply 1 application topically 2 (two)  times daily. Applied to back, abdomen, legs, and feet   Yes [provider]  cloNIDine (CATAPRES) 0.2 MG tablet One tablet at bedtime for blood pressure Patient taking differently: Take 0.2 mg by mouth at bedtime. One tablet at bedtime for blood pressure 08/18/17  Yes Fayrene Helper, MD  FLUoxetine (PROZAC) 20 MG capsule Take two in the am and one in the evening Patient taking differently: Take 20-40 mg by mouth See admin instructions. Take 40mg  in the am and 20mg  in the evening 04/23/18  Yes Cloria Spring, MD  Fluticasone-Salmeterol (ADVAIR DISKUS) 250-50 MCG/DOSE AEPB inhale 1 dose by mouth twice a day Patient taking differently: Inhale 1 puff into the lungs 2 (two) times daily.  04/09/15  Yes Fayrene Helper, MD  furosemide (LASIX) 40 MG tablet TAKE 1 TABLET BY MOUTH DAILY Patient taking differently: Take 40 mg by mouth daily.  12/30/17  Yes Fayrene Helper, MD  gabapentin (NEURONTIN) 300 MG capsule TAKE ONE CAPSULE BY MOUTH AT BEDTIME Patient taking differently: Take 300 mg by mouth at bedtime.  02/15/18  Yes Fayrene Helper, MD  glipiZIDE (GLUCOTROL XL) 10 MG 24 hr tablet Take two tablets once daily with breakfast Patient taking differently: Take 10 mg by mouth daily with breakfast. Take two tablets once daily with breakfast 04/21/18 04/22/19 Yes Fayrene Helper, MD  guaiFENesin (MUCINEX) 600 MG 12 hr tablet Take 1 tablet (600 mg total) by mouth 2 (two) times daily. 02/20/18 02/20/19 Yes Kathie Dike, MD  levothyroxine (SYNTHROID, LEVOTHROID) 100 MCG tablet TAKE 1 TABLET ONCE DAILY Patient taking differently: Take 100 mcg by mouth daily before breakfast.  03/29/18  Yes Fayrene Helper, MD  lisinopril (PRINIVIL,ZESTRIL) 5 MG tablet Take 1 tablet (5 mg total) by mouth daily. 02/10/18  Yes Fayrene Helper, MD  metoprolol tartrate (LOPRESSOR) 25 MG tablet Take 1 tablet (25 mg total) by mouth 2 (two) times daily. 04/06/18  Yes Memon, Jolaine Artist, MD  montelukast  (SINGULAIR) 10 MG tablet TAKE 1 TABLET BY MOUTH AT BEDTIME Patient taking differently: Take 10 mg by mouth at bedtime.  10/19/17  Yes Fayrene Helper, MD  omeprazole (PRILOSEC) 20 MG capsule Take 20 mg by mouth daily.   Yes [provider]  oxybutynin (DITROPAN) 5 MG tablet Take 1 tablet (5 mg total) by mouth every 8 (eight) hours as needed for bladder spasms. 04/06/18  Yes Kathie Dike, MD  rosuvastatin (CRESTOR) 20 MG tablet TAKE 1 TABLET ONCE DAILY Patient taking differently: Take 20 mg by mouth daily.  02/15/18  Yes Fayrene Helper, MD  Vitamin D, Ergocalciferol, (DRISDOL) 1.25 MG (50000 UT) CAPS capsule Take 50,000 Units by mouth every Monday.   Yes [provider]  zolpidem (AMBIEN) 10 MG tablet TAKE 1 TABLET(10  MG) BY MOUTH AT BEDTIME Patient taking differently: Take 10 mg by mouth at bedtime.  04/23/18  Yes Cloria Spring, MD    Physical Exam: Vitals:   07/01/2018 1930 06/18/2018 1935 06/10/2018 1952 06/23/2018 2030  BP: (!) 73/48 (!) 73/48  (!) 72/39  Pulse:  65    Resp: (!) 25 (!) 22  (!) 26  Temp:   97.7 F (36.5 C)   TempSrc:   Rectal   SpO2:  100%      Constitutional: Unresponsive.  Vitals:   06/02/2018 1930 06/04/2018 1935 06/10/2018 1952 06/13/2018 2030  BP: (!) 73/48 (!) 73/48  (!) 72/39  Pulse:  65    Resp: (!) 25 (!) 22  (!) 26  Temp:   97.7 F (36.5 C)   TempSrc:   Rectal   SpO2:  100%     Eyes: Bilateral pupils dilated and unresponsive to light lids and conjunctivae normal ENMT: On nonrebreather mask Neck: normal, no masses, no thyromegaly Respiratory: Anterior auscultation, bronchial Breath sounds, rhonchi, no accessory muscle use.  Cardiovascular: Regular rate and rhythm, no murmurs / rubs / gallops.  Woody induration bilateral lower extremities. Abdomen: no tenderness, no masses palpated. No hepatosplenomegaly. Bowel sounds positive.  Musculoskeletal: Cold extremities no clubbing / cyanosis.  No obvious abnormalities of the joints  skin: no  rashes, lesions, ulcers.  Neurologic: Unable to fully assess not moving extremities.  Slight flexion of upper extremities to pain psychiatric: Unable to ascertain   labs on Admission: I have personally reviewed following labs and imaging studies  CBC: Recent Labs  Lab 06/19/2018 1932  WBC 30.2*  NEUTROABS 23.7*  HGB 15.4*  HCT 52.1*  MCV 99.2  PLT 379   Basic Metabolic Panel: Recent Labs  Lab 06/26/2018 1932  NA 137  K 3.5  CL 95*  CO2 13*  GLUCOSE 116*  BUN 58*  CREATININE 5.29*  CALCIUM 7.6*   GFR: CrCl cannot be calculated (Unknown ideal weight.). Liver Function Tests: Recent Labs  Lab 06/20/2018 1932  AST 158*  ALT 52*  ALKPHOS 163*  BILITOT 1.4*  PROT 5.6*  ALBUMIN 2.3*   Cardiac Enzymes: Recent Labs  Lab 06/27/2018 1932  TROPONINI 0.35*   CBG: Recent Labs  Lab 06/30/2018 1907 06/13/2018 1939  GLUCAP 106* 112*   Urine analysis:    Component Value Date/Time   COLORURINE AMBER (A) 07/01/2018 1923   APPEARANCEUR CLOUDY (A) 06/02/2018 1923   LABSPEC 1.024 06/11/2018 1923   PHURINE 5.0 06/25/2018 1923   GLUCOSEU NEGATIVE 06/27/2018 [place] NEGATIVE 06/08/2018 [place] (A) 06/08/2018 [place] NEGATIVE 06/04/2018 1923   PROTEINUR 30 (A) 06/19/2018 1923   NITRITE NEGATIVE 06/08/2018 1923   LEUKOCYTESUR NEGATIVE 06/04/2018 1923    Radiological Exams on Admission: Ct Head Wo Contrast  Result Date: 06/24/2018 CLINICAL DATA:  Unresponsive. Focal neuro deficit, < 6 hrs, stroke suspected EXAM: CT HEAD WITHOUT CONTRAST TECHNIQUE: Contiguous axial images were obtained from the base of the skull through the vertex without intravenous contrast. COMPARISON:  Head CT and brain MRI 07/08/2016 FINDINGS: Brain: Minor chronic small vessel ischemia. No intracranial hemorrhage, mass effect, or midline shift. No hydrocephalus. The basilar cisterns are patent. No evidence of territorial infarct or acute ischemia. No extra-axial or intracranial  fluid collection. Vascular: Atherosclerosis of skull base vasculature without hyperdense vessel. Skull: No fracture or focal lesion. Sinuses/Orbits: Right alveolar lucency on the right measures 2.1 cm, unchanged in size from prior MRI, not  previously included in the CT field of view. Paranasal sinuses and mastoid air cells are clear. Orbits are unremarkable. Other: None. IMPRESSION: No acute intracranial abnormality. Electronically Signed   By: Keith Rake M.D.   On: 06/14/2018 21:04   Dg Chest Port 1 View  Result Date: 06/30/2018 CLINICAL DATA:  Unresponsive. EXAM: PORTABLE CHEST 1 VIEW COMPARISON:  Chest radiograph 04/05/2018. Chest CT 02/18/2018 FINDINGS: Progressive consolidative opacity in the right upper lung, patient with known lung mass and chronic lung disease on prior CT. Worsening patchy opacities in the left mid lung. Unchanged cardiomegaly with aortic atherosclerosis. Unchanged right hilar prominence likely combination of adenopathy and enlarged pulmonary artery. No pneumothorax or large pleural effusion. IMPRESSION: 1. Progressive consolidative right upper lobe opacity since radiographs 2 months ago. Prior CT demonstrated right upper lobe lung mass and chronic lung disease in the upper lobe. Findings may represent progression in malignancy versus pneumonia/postobstructive change. 2. Worsening patchy opacity in the left mid lung, progression of chronic lung disease versus superimposed atelectasis/edema. 3. Chronic cardiomegaly and Aortic Atherosclerosis (ICD10-I70.0). Unchanged enlarged right hilum likely combination of adenopathy and enlarged pulmonary artery. Electronically Signed   By: Keith Rake M.D.   On: 06/02/2018 20:29    EKG: EKG shows sinus rhythm QTc prolonged 553, with biatrial enlargement.  Abnormal with nonspecific T wave abnormalities but this is not significantly different from prior EKG 04/05/2018.  Assessment/Plan Active Problems:   Septic shock  (HCC)  Unresponsiveness- likely secondary to septic shock, hypoxia, marked leukocytosis 30, hypotensive- blood pressure 70s improved to 130 status post 3 L bolus.  With multiorgan dysfunction -cardio vascular, renal, neurologic also with metabolic acidosis- 7.1, bicarb 13, . Likely secondary to postobstructive pneumonia.  As suggested on chest x-ray.  UA- not suggestive of infection.  CT negative for acute abnormality.  Patient is DNR.  Talked to patient's nephew- Coralyn Mark at bedside, he and his mother(recently diagnosed with lung cancer) patient's HCPOA.  Patient's nephew, reconfirms patient's DNR status, tells me patient has discussed this and does not want any aggressive measures, declined pressors and central lines but they have elected to continue IV antibiotics, IV fluids and blood work. - N.p.o. - 1 amp Bicarb - Cont IVF bicarb gtt + DSw 100cc/hr x 15hrs -IV antibiotics with vancomycin cefepime and metronidazole (avoiding Zosyn for renal sparing effect) - F/u blood cultures -Lactic acid pending -CMP, CBC a.m. - Duonebs scheduled -Consult palliative care- order placed. - Aspiration precautions -Respiratory therapy protocol  Anion gap metabolic acidosis-anion gap 29, bicarb low 13, pH 7.1.  PCO2 31.  PO2 71.  Likely secondary to azotemia and likely lactic acidosis from poor perfussion and sepsis. -1 amp of bicarb, continue bicarb GTT -CMP a.m.  Elevated troponin- likely demand ischemia from septic shock.  EKG - Abnormal with nonspecific T wave abnormalities but this is not significantly different from prior EKG 04/05/2018. -Troponin trend, family has requested continued blood work   Elevated liver enzymes-likely related to multiorgan dysfunction from septic shock.  Previously normal liver enzymes, now AST 158, ALT 52, ALP 163, total bili 1.4. - CMP a.m  Acute kidney injury- Markedly elevated at 5.29 from prior baseline ~ 1.  Likely multiorgan dysfunction/shock kidney from septic shock.  UA  at this time showing 30 protein, rare bacteria., negative hemoglobin - Culture urine - Hydrate - CMP a.m - Hold lisinopril  Severe COPD, lung mass-patient DNR, recent palliative care encounter.  Patient elected not to pursue diagnosis treatment of lung mass.  2 sisters  have/had lung cancer. -DuoNeb scheduled - cont home steroid  -Palliative care consult  ProLong QTC- 533, not new.  - Avoid Qt prolonging meds  HTN- Hypotensive. -Hold home lisinopril, metoprolol, clonidine.  Hypothyroidism - TSH check   DVT prophylaxis: Heparin Code Status: DNR Family Communication: I have explained prognosis and severity of patient's condition, including likely diagnosis of septic shock with multiorgan dysfunction to patient nephew- Coralyn Mark, whose mother-was also recently diagnosed with lung cancer.  Both of them are patient's HCPOA, patient's nephew believes his mother has signed documents confirming this.  They are patient's closest relatives.  She has no children.  Patient understands that patients may pass overnight.  Disposition Plan: Per rounding team Consults called: Palliative Admission status: Inpt, tele   Bethena Roys MD [facility] Pager [phone] From 3PM- 11PM.  Otherwise please contact night-coverage [email or web address] Password [facility]  06/13/2018, 10:33 PM

## 2018-06-06 NOTE — ED Notes (Signed)
Date and time results received: 06/02/2018 1956 (use smartphrase ".now" to insert current time)  Test: pH  Critical Value: 7.178  Name of Provider Notified: Dr. Sabra Heck notified at [place]? Or Actions Taken?: no/na

## 2018-06-06 NOTE — ED Provider Notes (Signed)
[facility] EMERGENCY DEPARTMENT Provider Note   CSN: [id number] Arrival date & time: 06/22/2018  1902     History   Chief Complaint Chief Complaint  Patient presents with  . unresponsive    HPI [name] is a 68 y.o. female with medical history significant of pulmonary hypertension, DM type 1, COPD presenting to the emergency department via EMS after being found unresponsive. She resides at [facility]. The facility reports she was in her usual health this morning, ate lunch and then took a nap. When the med tech when to check on her she was unresponsive.  Her last known normal is unknown. Her glucose at the facility was in the 50 and did not raise after 2 glucagon injections. EMS gave an amp of D50 and her sugar increased to the 150s.   Pt is a DNR. Level 5 caveat applies as pt is unresponsive and unable to give history.  Past Medical History:  Diagnosis Date  . Anxiety   . COPD (chronic obstructive pulmonary disease) ([place])   . Depression 2008  . Diabetes mellitus, type 1 1983   type 2 diabetes  . DJD (degenerative joint disease)    of the spine   . GERD (gastroesophageal reflux disease)   . Hyperlipidemia 2007  . Hypertension 1983  . Hypothyroidism 2004   left thyroidectomy  . Insomnia   . Obesity     Patient Active Problem List   Diagnosis Date Noted  . Septic shock ([place]) 06/05/2018  . Pulmonary hypertension ([place]) 04/06/2018  . SVT (supraventricular tachycardia) ([place]) 04/05/2018  . Chest pain 04/05/2018  . COPD (chronic obstructive pulmonary disease) ([place]) 02/19/2018  . Mass of upper lobe of right lung   . Goals of care, counseling/discussion   . Palliative care by specialist   . Encounter for hospice care discussion   . COPD exacerbation ([place]) 02/18/2018  . Hypokalemia 02/18/2018  . Lung mass- suspected Lung Cancer 02/18/2018  . Screening for depression 11/29/2017  . Non compliance with medical treatment 08/30/2017  . Abnormal CT scan,  chest 11/03/2016  . Hospital discharge follow-up 08/10/2016  . Abnormal brain scan 08/10/2016  . COPD with acute exacerbation ([place]) 07/18/2016  . Weakness generalized 07/18/2016  . At high risk for injury related to fall 05/29/2016  . Unsteady gait 05/29/2016  . Anemia, deficiency 01/01/2016  . Back spasm 10/01/2015  . DNR no code (do not resuscitate) 06/18/2015  . Excessive daytime sleepiness 12/30/2014  . Abnormal mammogram 12/30/2014  . Chronic respiratory failure with hypoxia ([place]) 12/15/2013  . Hearing loss 101-01-2013  . Oxygen dependent 11/19/2012  . Carotid bruit 04/12/2012  . Uncontrolled diabetes mellitus with diabetic nephropathy ([place]) 08/04/2011  . FATIGUE 02/20/2009  . NICOTINE ADDICTION 12/31/2008  . ALLERGIC RHINITIS CAUSE UNSPECIFIED 10/07/2008  . Essential hypertension 03/07/2008  . UNSPECIFIED PERIPHERAL VASCULAR DISEASE 03/06/2008  . Hypothyroidism 09/08/2007  . Hyperlipidemia LDL goal <100 09/08/2007  . Morbid obesity ([place]) 09/08/2007  . Anxiety and depression 09/08/2007  . COPD, severe ([place]) 09/08/2007  . GERD 09/08/2007  . Osteoarthritis of spine 09/08/2007  . Insomnia 09/08/2007    Past Surgical History:  Procedure Laterality Date  . APPENDECTOMY    . bilatal great toenail removed , complicated by MRSA in rigt toenail  May 2011  . BREAST EXCISIONAL BIOPSY Right    20+ yrs ago  . TONSILLECTOMY  childhood   . TOTAL ABDOMINAL HYSTERECTOMY W/ BILATERAL SALPINGOOPHORECTOMY    . tracheotomy secondary to failed  attempt and lung biopsy       OB History   No obstetric history on file.      Home Medications    Prior to Admission medications   Medication Sig Start Date End Date Taking? Authorizing Provider  ALPRAZolam (XANAX) 1 MG tablet TAKE 1 TABLET BY MOUTH FOUR TIMES DAILY Patient taking differently: Take 1 mg by mouth 4 (four) times daily.  04/23/18  Yes Cloria Spring, MD  ARIPiprazole (ABILIFY) 2 MG tablet Take 1 tablet (2 mg total) by mouth  daily. 04/23/18  Yes Cloria Spring, MD  cetaphil (CETAPHIL) cream Apply 1 application topically 2 (two) times daily. Applied to back, abdomen, legs, and feet   Yes [provider]  cloNIDine (CATAPRES) 0.2 MG tablet One tablet at bedtime for blood pressure Patient taking differently: Take 0.2 mg by mouth at bedtime. One tablet at bedtime for blood pressure 08/18/17  Yes Fayrene Helper, MD  FLUoxetine (PROZAC) 20 MG capsule Take two in the am and one in the evening Patient taking differently: Take 20-40 mg by mouth See admin instructions. Take 40mg  in the am and 20mg  in the evening 04/23/18  Yes Cloria Spring, MD  Fluticasone-Salmeterol (ADVAIR DISKUS) 250-50 MCG/DOSE AEPB inhale 1 dose by mouth twice a day Patient taking differently: Inhale 1 puff into the lungs 2 (two) times daily.  04/09/15  Yes Fayrene Helper, MD  furosemide (LASIX) 40 MG tablet TAKE 1 TABLET BY MOUTH DAILY Patient taking differently: Take 40 mg by mouth daily.  12/30/17  Yes Fayrene Helper, MD  gabapentin (NEURONTIN) 300 MG capsule TAKE ONE CAPSULE BY MOUTH AT BEDTIME Patient taking differently: Take 300 mg by mouth at bedtime.  02/15/18  Yes Fayrene Helper, MD  glipiZIDE (GLUCOTROL XL) 10 MG 24 hr tablet Take two tablets once daily with breakfast Patient taking differently: Take 10 mg by mouth daily with breakfast. Take two tablets once daily with breakfast 04/21/18 04/22/19 Yes Fayrene Helper, MD  guaiFENesin (MUCINEX) 600 MG 12 hr tablet Take 1 tablet (600 mg total) by mouth 2 (two) times daily. 02/20/18 02/20/19 Yes Kathie Dike, MD  levothyroxine (SYNTHROID, LEVOTHROID) 100 MCG tablet TAKE 1 TABLET ONCE DAILY Patient taking differently: Take 100 mcg by mouth daily before breakfast.  03/29/18  Yes Fayrene Helper, MD  lisinopril (PRINIVIL,ZESTRIL) 5 MG tablet Take 1 tablet (5 mg total) by mouth daily. 02/10/18  Yes Fayrene Helper, MD  metoprolol tartrate (LOPRESSOR) 25 MG tablet  Take 1 tablet (25 mg total) by mouth 2 (two) times daily. 04/06/18  Yes Memon, Jolaine Artist, MD  montelukast (SINGULAIR) 10 MG tablet TAKE 1 TABLET BY MOUTH AT BEDTIME Patient taking differently: Take 10 mg by mouth at bedtime.  10/19/17  Yes Fayrene Helper, MD  omeprazole (PRILOSEC) 20 MG capsule Take 20 mg by mouth daily.   Yes [provider]  oxybutynin (DITROPAN) 5 MG tablet Take 1 tablet (5 mg total) by mouth every 8 (eight) hours as needed for bladder spasms. 04/06/18  Yes Kathie Dike, MD  rosuvastatin (CRESTOR) 20 MG tablet TAKE 1 TABLET ONCE DAILY Patient taking differently: Take 20 mg by mouth daily.  02/15/18  Yes Fayrene Helper, MD  Vitamin D, Ergocalciferol, (DRISDOL) 1.25 MG (50000 UT) CAPS capsule Take 50,000 Units by mouth every Monday.   Yes [provider]  zolpidem (AMBIEN) 10 MG tablet TAKE 1 TABLET(10 MG) BY MOUTH AT BEDTIME Patient taking differently: Take 10 mg by  mouth at bedtime.  04/23/18  Yes Cloria Spring, MD    Family History Family History  Problem Relation Age of Onset  . Hypertension Mother   . Stroke Mother   . Cancer Father        lung   . Hypertension Sister   . Depression Sister   . Alcohol abuse Sister   . Hypertension Sister   . Depression Sister   . Cancer Sister   . Diabetes Sister   . Depression Sister   . Cancer Sister        renal   . Kidney failure Brother   . Kidney failure Other        family history   . Hypertension Other        family history     Social History Social History   Tobacco Use  . Smoking status: Current Every Day Smoker    Packs/day: 0.50    Years: 44.00    Pack years: 22.00    Types: Cigarettes  . Smokeless tobacco: Never Used  . Tobacco comment: current 15/ day, would like to smoke 7/ day  Substance Use Topics  . Alcohol use: No  . Drug use: No     Allergies   Patient has no known allergies.   Review of Systems Review of Systems  Unable to perform ROS: Mental status  change     Physical Exam Updated Vital Signs BP (!) 73/48 (BP Location: Right Arm)   Pulse 65   Resp (!) 22   SpO2 100%   Physical Exam Vitals signs and nursing note reviewed.  Constitutional:      Comments: Pt has severe altered mental status. She is critically and acutely ill.  HENT:     Head: Normocephalic.     Mouth/Throat:     Mouth: Mucous membranes are dry.     Pharynx: Oropharynx is clear.  Cardiovascular:     Rate and Rhythm: Normal rate.  Pulmonary:     Breath sounds: Rhonchi present.     Comments: Pt is mouth breathing with snoring respirations Abdominal:     General: There is no distension.     Palpations: Abdomen is soft.     Tenderness: There is no abdominal tenderness.  Musculoskeletal:     Comments: Spontaneously moves bilateral arms.  No movement in lower extremities.  Skin:    Comments: Scar from previous tracheostomy. Bilateral thighs edematous.  Neurological:     Comments: Pt is not responsive to painful stimuli. Eyes without purposeful movement.       ED Treatments / Results  Labs (all labs ordered are listed, but only abnormal results are displayed) Labs Reviewed  COMPREHENSIVE METABOLIC PANEL - Abnormal; Notable for the following components:      Result Value   Chloride 95 (*)    CO2 13 (*)    Glucose, Bld 116 (*)    BUN 58 (*)    Creatinine, Ser 5.29 (*)    Calcium 7.6 (*)    Total Protein 5.6 (*)    Albumin 2.3 (*)    AST 158 (*)    ALT 52 (*)    Alkaline Phosphatase 163 (*)    Total Bilirubin 1.4 (*)    GFR calc non Af Amer 8 (*)    GFR calc Af Amer 9 (*)    Anion gap 29 (*)    All other components within normal limits  CBC WITH DIFFERENTIAL/PLATELET - Abnormal; Notable for the following  components:   WBC 30.2 (*)    RBC 5.25 (*)    Hemoglobin 15.4 (*)    HCT 52.1 (*)    MCHC 29.6 (*)    nRBC 0.3 (*)    Neutro Abs 23.7 (*)    Monocytes Absolute 1.9 (*)    Basophils Absolute 0.2 (*)    Abs Immature Granulocytes 0.88 (*)     All other components within normal limits  URINALYSIS, ROUTINE W REFLEX MICROSCOPIC - Abnormal; Notable for the following components:   Color, Urine AMBER (*)    APPearance CLOUDY (*)    Bilirubin Urine MODERATE (*)    Protein, ur 30 (*)    Bacteria, UA RARE (*)    All other components within normal limits  BLOOD GAS, ARTERIAL - Abnormal; Notable for the following components:   pH, Arterial 7.178 (*)    pCO2 arterial 31.8 (*)    pO2, Arterial 71.6 (*)    Bicarbonate 12.5 (*)    Acid-base deficit 15.4 (*)    All other components within normal limits  TROPONIN I - Abnormal; Notable for the following components:   Troponin I 0.35 (*)    All other components within normal limits  CBG MONITORING, ED - Abnormal; Notable for the following components:   Glucose-Capillary 106 (*)    All other components within normal limits  CBG MONITORING, ED - Abnormal; Notable for the following components:   Glucose-Capillary 112 (*)    All other components within normal limits  CULTURE, BLOOD (ROUTINE X 2)  CULTURE, BLOOD (ROUTINE X 2)  MAGNESIUM  LACTIC ACID, PLASMA  LACTIC ACID, PLASMA  COMPREHENSIVE METABOLIC PANEL  CBC    EKG EKG Interpretation  Date/Time:  Sunday June 06 2018 19:35:35 EST Ventricular Rate:  65 PR Interval:    QRS Duration: 110 QT Interval:  384 QTC Calculation: 400 R Axis:   166 Text Interpretation:  Sinus rhythm Borderline prolonged PR interval RAE, consider biatrial enlargement Low voltage, precordial leads RVH with secondary repolarization abnrm ST depr, consider ischemia, inferior leads Borderline ST elevation, lateral leads Since last tracing rate slower Baseline wander Confirmed by Noemi Chapel [id number]) on 06/21/2018 7:56:54 PM   Radiology Ct Head Wo Contrast  Result Date: 06/11/2018 CLINICAL DATA:  Unresponsive. Focal neuro deficit, < 6 hrs, stroke suspected EXAM: CT HEAD WITHOUT CONTRAST TECHNIQUE: Contiguous axial images were obtained from the base of the  skull through the vertex without intravenous contrast. COMPARISON:  Head CT and brain MRI 07/08/2016 FINDINGS: Brain: Minor chronic small vessel ischemia. No intracranial hemorrhage, mass effect, or midline shift. No hydrocephalus. The basilar cisterns are patent. No evidence of territorial infarct or acute ischemia. No extra-axial or intracranial fluid collection. Vascular: Atherosclerosis of skull base vasculature without hyperdense vessel. Skull: No fracture or focal lesion. Sinuses/Orbits: Right alveolar lucency on the right measures 2.1 cm, unchanged in size from prior MRI, not previously included in the CT field of view. Paranasal sinuses and mastoid air cells are clear. Orbits are unremarkable. Other: None. IMPRESSION: No acute intracranial abnormality. Electronically Signed   By: Keith Rake M.D.   On: 06/20/2018 21:04   Dg Chest Port 1 View  Result Date: 06/26/2018 CLINICAL DATA:  Unresponsive. EXAM: PORTABLE CHEST 1 VIEW COMPARISON:  Chest radiograph 04/05/2018. Chest CT 02/18/2018 FINDINGS: Progressive consolidative opacity in the right upper lung, patient with known lung mass and chronic lung disease on prior CT. Worsening patchy opacities in the left mid lung. Unchanged cardiomegaly with aortic atherosclerosis. Unchanged  right hilar prominence likely combination of adenopathy and enlarged pulmonary artery. No pneumothorax or large pleural effusion. IMPRESSION: 1. Progressive consolidative right upper lobe opacity since radiographs 2 months ago. Prior CT demonstrated right upper lobe lung mass and chronic lung disease in the upper lobe. Findings may represent progression in malignancy versus pneumonia/postobstructive change. 2. Worsening patchy opacity in the left mid lung, progression of chronic lung disease versus superimposed atelectasis/edema. 3. Chronic cardiomegaly and Aortic Atherosclerosis (ICD10-I70.0). Unchanged enlarged right hilum likely combination of adenopathy and enlarged  pulmonary artery. Electronically Signed   By: Keith Rake M.D.   On: 06/22/2018 20:29    Procedures Procedures (including critical care time)  Medications Ordered in ED Medications  metroNIDAZOLE (FLAGYL) IVPB 500 mg (0 mg Intravenous Stopped 06/16/2018 2129)  vancomycin (VANCOCIN) 2,000 mg in sodium chloride 0.9 % 500 mL IVPB (has no administration in time range)  mometasone-formoterol (DULERA) 200-5 MCG/ACT inhaler 2 puff (2 puffs Inhalation Not Given 06/21/2018 2211)  heparin injection 5,000 Units (has no administration in time range)  ondansetron (ZOFRAN) tablet 4 mg (has no administration in time range)    Or  ondansetron (ZOFRAN) injection 4 mg (has no administration in time range)  acetaminophen (TYLENOL) tablet 650 mg (has no administration in time range)    Or  acetaminophen (TYLENOL) suppository 650 mg (has no administration in time range)  sodium bicarbonate injection 50 mEq (has no administration in time range)  sodium bicarbonate 100 mEq in dextrose 5 % 1,000 mL infusion (has no administration in time range)  naloxone [facility]) injection 0.4 mg (0.4 mg Intravenous Given 06/16/2018 2030)  sodium chloride 0.9 % bolus 1,000 mL (0 mLs Intravenous Stopped 06/20/2018 2207)    And  sodium chloride 0.9 % bolus 1,000 mL (0 mLs Intravenous Stopped 06/24/2018 2207)    And  sodium chloride 0.9 % bolus 1,000 mL (0 mLs Intravenous Stopped 06/23/2018 2207)  ceFEPIme (MAXIPIME) 2 g in sodium chloride 0.9 % 100 mL IVPB (0 g Intravenous Stopped 06/14/2018 2207)     Initial Impression / Assessment and Plan / ED Course  I have reviewed the triage vital signs and the nursing notes.  Pertinent labs & imaging results that were available during my care of the patient were reviewed by me and considered in my medical decision making (see chart for details).  Lab work today multiple with multiple abnormal results.  Elevated troponin 0.35 with abnormal EKG suggestive of MI, ABG shows metabolic acidosis, CBC with  leukocytosis of 30.2, chest xray suggestive of multifocal pneumonia and progressive consolidative right upper lobe opacity in comparison to xray x 2 months ago. Ct head without contrast without acute intracranial abnormalities. UA without infection. Her presentation and labs are suggestive of septic shock. Family confirmed her DNR status. See Dr. Ammie Ferrier note for further details. She is admitted to the hospitalist service. It is very unfortunate her prognosis is poor.     Final Clinical Impressions(s) / ED Diagnoses   Final diagnoses:  Septic shock [facility])  Multifocal pneumonia  Metabolic acidosis    ED Discharge Orders    None       Flint Melter 06/15/2018 2218    Noemi Chapel, MD 06/26/18 [phone]

## 2018-06-06 NOTE — ED Notes (Signed)
CRITICAL VALUE ALERT  Critical Value: Trop 0.35  Date & Time Notied:  06/30/2018 2010  Provider Notified: Dr. Sabra Heck   Orders Received/Actions taken: see chart

## 2018-06-06 NOTE — Progress Notes (Signed)
Pharmacy Note:  Initial antibiotic(s) regimen of vancomycin and cefepime ordered by EDP to treat infection of unknown source.  CrCl cannot be calculated (Unknown ideal weight.).   No Known Allergies  Vitals:   06/11/2018 1935 06/02/2018 1952  BP: (!) 73/48   Pulse: 65   Resp: (!) 22   Temp:  97.7 F (36.5 C)  SpO2: 100%     Anti-infectives (From admission, onward)   Start     Dose/Rate Route Frequency Ordered Stop   06/20/2018 2100  vancomycin (VANCOCIN) 2,000 mg in sodium chloride 0.9 % 500 mL IVPB     2,000 mg 250 mL/hr over 120 Minutes Intravenous  Once 06/21/2018 2024     06/20/2018 2000  ceFEPIme (MAXIPIME) 2 g in sodium chloride 0.9 % 100 mL IVPB     2 g 200 mL/hr over 30 Minutes Intravenous  Once 06/20/2018 1956     06/13/2018 2000  metroNIDAZOLE (FLAGYL) IVPB 500 mg     500 mg 100 mL/hr over 60 Minutes Intravenous Every 8 hours 06/09/2018 1956     06/25/2018 2000  vancomycin (VANCOCIN) IVPB 1000 mg/200 mL premix  Status:  Discontinued     1,000 mg 200 mL/hr over 60 Minutes Intravenous  Once 06/15/2018 1956 06/30/2018 2024      Antimicrobials this admission:  vancomycin 1/5 >> Cefepime 1/5 >>     Microbiology results:  1/5 BC x2:     Plan: Initial dose(s) of cefepime 2g and vancomycin 2g X 1 ordered. F/U admission orders for further dosing depending on renal function.  Despina Pole, [facility] 06/15/2018 8:24 PM

## 2018-06-06 NOTE — ED Provider Notes (Signed)
The patient is an acutely ill and critically ill 68 year old female who presents from [facility] facility with altered mental status, severe hypoglycemia, hypoxia.  She was found by nursing staff to be in the state when they checked on her.  She is unable to give [place] any information thus a level 5 caveat applies  On physical exam the patient is obese, she has edema going to the proximal thighs bilaterally with some superficial wounds on her right lower extremity which have been dressed and are healing.  She has no obvious masses in her abdomen, she is not tachycardic, she has diffuse rhonchi, she has sonorous breathing, she is not responsive to painful stimuli, her eyes will move from right to left but not purposefully.  She seems to spontaneously move bilateral arms but not with any purpose.  She is not moving her legs to painful stimuli.  It is unclear exactly what is causing this patient symptoms but after correcting the hypoglycemia her mental status did not improve.  We will give Narcan, check a CT scan of the brain though this patient is a DO NOT RESUSCITATE and will not be intubated for that reason.  We will attempt to get the family's information but this does not appear to have a very good prognosis.  The patient has multiple abnormalities including a metabolic acidosis on the ABG, severe leukocytosis over 30,000 multifocal pneumonia on the x-ray and ongoing septic shock despite fluid and antibiotic resuscitation.  The patient's EKG is abnormal, she has a troponin of 0.3, she is unresponsive and unable to give [place] any information however the family has confirmed that she has a DO NOT RESUSCITATE order and they declined central line or vasopressor therapy.  This patient is critically ill, we are doing everything we can within the bounds that have been set by the patient and the family.  I have discussed her care with Dr. Denton Brick of hospitalist service who will admit her for the evening.  She  is so ill that she may not make it until morning.  CRITICAL CARE Performed by: Johnna Acosta Total critical care time: 35 minutes Critical care time was exclusive of separately billable procedures and treating other patients. Critical care was necessary to treat or prevent imminent or life-threatening deterioration. Critical care was time spent personally by me on the following activities: development of treatment plan with patient and/or surrogate as well as nursing, discussions with consultants, evaluation of patient's response to treatment, examination of patient, obtaining history from patient or surrogate, ordering and performing treatments and interventions, ordering and review of laboratory studies, ordering and review of radiographic studies, pulse oximetry and re-evaluation of patient's condition.   Medical screening examination/treatment/procedure(s) were conducted as a shared visit with non-physician practitioner(s) and myself.  I personally evaluated the patient during the encounter.  Clinical Impression:   Final diagnoses:  Septic shock ([place])  Multifocal pneumonia  Metabolic acidosis         Noemi Chapel, MD 08-Jun-2018 [phone]

## 2018-06-06 NOTE — ED Notes (Signed)
Notified Herbie [place], RRT of [facility] inhaler

## 2018-06-06 NOTE — Progress Notes (Signed)
Patient is unable to do Inhaler. She is very septic and lethargic and most likely actively die ing. She is on 100 percent non rebreather mask

## 2018-06-06 NOTE — ED Notes (Signed)
Coralyn Mark, nephew, wants to be updated with patient's plan of care. If patient is transferred call Coralyn Mark [phone]

## 2018-06-06 NOTE — ED Notes (Signed)
Notified AC to bring pt's Vancomycin.

## 2018-06-07 ENCOUNTER — Other Ambulatory Visit: Payer: Self-pay

## 2018-06-07 DIAGNOSIS — E872 Acidosis, unspecified: Secondary | ICD-10-CM | POA: Insufficient documentation

## 2018-06-07 DIAGNOSIS — J189 Pneumonia, unspecified organism: Secondary | ICD-10-CM

## 2018-06-07 LAB — COMPREHENSIVE METABOLIC PANEL
ALT: UNDETERMINED U/L (ref 0–44)
AST: UNDETERMINED U/L (ref 15–41)
Albumin: 2 g/dL — ABNORMAL LOW (ref 3.5–5.0)
Alkaline Phosphatase: 137 U/L — ABNORMAL HIGH (ref 38–126)
Anion gap: >20 — ABNORMAL HIGH (ref 5–15)
BUN: 59 mg/dL — ABNORMAL HIGH (ref 8–23)
CO2: 7 mmol/L — ABNORMAL LOW (ref 22–32)
Calcium: 6.6 mg/dL — ABNORMAL LOW (ref 8.9–10.3)
Chloride: 99 mmol/L (ref 98–111)
Creatinine, Ser: 5.12 mg/dL — ABNORMAL HIGH (ref 0.44–1.00)
GFR calc Af Amer: 9 mL/min — ABNORMAL LOW (ref >60)
GFR calc non Af Amer: 8 mL/min — ABNORMAL LOW (ref >60)
GLUCOSE: 123 mg/dL — AB (ref 70–99)
Potassium: 5.2 mmol/L — ABNORMAL HIGH (ref 3.5–5.1)
Sodium: 137 mmol/L (ref 135–145)
Total Bilirubin: 1.6 mg/dL — ABNORMAL HIGH (ref 0.3–1.2)
Total Protein: 4.6 g/dL — ABNORMAL LOW (ref 6.5–8.1)

## 2018-06-07 LAB — TROPONIN I: Troponin I: 0.58 ng/mL (ref <0.03)

## 2018-06-07 LAB — CBG MONITORING, ED: Glucose-Capillary: 134 mg/dL — ABNORMAL HIGH (ref 70–99)

## 2018-06-07 LAB — LACTIC ACID, PLASMA
Lactic Acid, Venous: 11.8 mmol/L (ref 0.5–1.9)
Lactic Acid, Venous: 11.5 mmol/L (ref 0.5–1.9)
Lactic Acid, Venous: 13.7 mmol/L (ref 0.5–1.9)

## 2018-06-07 LAB — TSH: TSH: 4.848 u[IU]/mL — ABNORMAL HIGH (ref 0.350–4.500)

## 2018-06-07 MED ORDER — LACTATED RINGERS IV BOLUS
500.0000 mL | Freq: Once | INTRAVENOUS | Status: AC
  Administered 2018-06-07: 500 mL via INTRAVENOUS

## 2018-06-07 MED ORDER — SODIUM CHLORIDE 0.9 % IV SOLN
INTRAVENOUS | Status: DC | PRN
  Administered 2018-06-07: 500 mL via INTRAVENOUS

## 2018-06-07 MED ORDER — LORAZEPAM 2 MG/ML IJ SOLN: 1.0000 mg | INTRAMUSCULAR | Status: DC | PRN

## 2018-06-07 MED ORDER — MORPHINE SULFATE (PF) 2 MG/ML IV SOLN
1.0000 mg | INTRAVENOUS | Status: DC | PRN
  Administered 2018-06-07: 2 mg via INTRAVENOUS
  Filled 2018-06-07: qty 1

## 2018-06-08 ENCOUNTER — Ambulatory Visit: Payer: Medicare Other | Admitting: Family Medicine

## 2018-06-08 ENCOUNTER — Ambulatory Visit: Payer: Medicare Other

## 2018-06-08 LAB — URINE CULTURE: Culture: NO GROWTH

## 2018-06-11 LAB — CULTURE, BLOOD (ROUTINE X 2)
CULTURE: NO GROWTH
Culture: NO GROWTH
Special Requests: ADEQUATE
Special Requests: ADEQUATE

## 2018-07-03 NOTE — ED Notes (Signed)
Dr Olevia Bowens aware of pt's output

## 2018-07-03 NOTE — ED Notes (Signed)
Date and time results received: 06/18/18 06:45  Test: trop 0.5 Critical Value:   Name of Provider Notified: Dr Olevia Bowens  Orders Received? Or Actions Taken?: none

## 2018-07-03 NOTE — Progress Notes (Signed)
Increased rate to 24 ,patient breathing 27 --decreased I time to .80

## 2018-07-03 NOTE — ED Notes (Signed)
CRITICAL VALUE ALERT  Critical Value:  Trop 0.45  Date & Time Notied:  06/15/2018 2359  Provider Notified: Silas Sacramento  Orders Received/Actions taken: See chart

## 2018-07-03 NOTE — ED Notes (Signed)
ED TO INPATIENT HANDOFF REPORT  ED Nurse Name and Phone #:   Name/Age/Gender [name] 68 y.o. female Room/Bed: APA14/APA14  Code Status   Code Status: DNR  Home/SNF/Othe {Patient oriented no Is this baseline? no  Triage Complete: Triage complete  Chief Complaint ems unresponsive  Triage Note Pt brought in by [facility] from [facility] for c/o unresponsiveness; staff reported to ems that pt was her "normal" self today and later this evening pt became unresponsive and only opens eyes to verbal stimuli; pt cbg at facility 56 and 2 glucagon given and ems; cbg with [id number] after amp of D50 given   Allergies No Known Allergies  Level of Care/Admitting Diagnosis ED Disposition    ED Disposition Condition [place]: [facility] [[id number]]  Level of Care: Telemetry [5]  Diagnosis: Septic shock [facility]) [[id number]]  Admitting Physician: Kara Pacer  Attending Physician: Bethena Roys [phone]  Estimated length of stay: past midnight tomorrow  Certification:: I certify this patient will need inpatient services for at least 2 midnights  PT Class (Do Not Modify): Inpatient [101]  PT Acc Code (Do Not Modify): Private [1]       Medical/Surgery History Past Medical History:  Diagnosis Date  . Anxiety   . COPD (chronic obstructive pulmonary disease) ([place])   . Depression 2008  . Diabetes mellitus, type 1 1983   type 2 diabetes  . DJD (degenerative joint disease)    of the spine   . GERD (gastroesophageal reflux disease)   . Hyperlipidemia 2007  . Hypertension 1983  . Hypothyroidism 2004   left thyroidectomy  . Insomnia   . Obesity    Past Surgical History:  Procedure Laterality Date  . APPENDECTOMY    . bilatal great toenail removed , complicated by MRSA in rigt toenail  May 2011  . BREAST EXCISIONAL BIOPSY Right    20+ yrs ago  . TONSILLECTOMY  childhood   . TOTAL ABDOMINAL HYSTERECTOMY W/ BILATERAL  SALPINGOOPHORECTOMY    . tracheotomy secondary to failed attempt and lung biopsy       IV Location/Drains/Wounds Patient Lines/Drains/Airways Status   Active Line/Drains/Airways    Name:   Placement date:   Placement time:   Site:   Days:   Peripheral IV 06/22/2018 Right Hand   06/23/2018    1922    Hand   1   Peripheral IV 06/05/2018 Left Antecubital   07/01/2018    2030    Antecubital   1   Peripheral IV 16-Jun-2018 Left Other (Comment)   06/16/18    0521    Other (Comment)   less than 1   External Urinary Catheter   Jun 16, 2018    0329    -   less than 1   Wound / Incision (Open or Dehisced) 2018/06/16 Venous stasis ulcer Leg Right;Lower pt has two open areas to front of right lower leg, minimal drainage noted yellow in color, no odor,    06-16-18    0559    Leg   less than 1          Intake/Output Last 24 hours  Intake/Output Summary (Last 24 hours) at 06-16-2018 3570 Last data filed at 2018/06/16 0530 Gross per 24 hour  Intake 4628.74 ml  Output -  Net 4628.74 ml    Labs/Imaging Results for orders placed or performed during the hospital encounter of 07/01/2018 (from the past 48 hour(s))  CBG monitoring, ED  Status: Abnormal   Collection Time: 06/05/2018  7:07 PM  Result Value Ref Range   Glucose-Capillary 106 (H) 70 - 99 mg/dL  Urinalysis, Routine w reflex microscopic     Status: Abnormal   Collection Time: 06/20/2018  7:23 PM  Result Value Ref Range   Color, Urine AMBER (A) YELLOW    Comment: BIOCHEMICALS MAY BE AFFECTED BY COLOR   APPearance CLOUDY (A) CLEAR   Specific Gravity, Urine 1.024 1.005 - 1.030   pH 5.0 5.0 - 8.0   Glucose, UA NEGATIVE NEGATIVE mg/dL   Hgb urine dipstick NEGATIVE NEGATIVE   Bilirubin Urine MODERATE (A) NEGATIVE   Ketones, ur NEGATIVE NEGATIVE mg/dL   Protein, ur 30 (A) NEGATIVE mg/dL   Nitrite NEGATIVE NEGATIVE   Leukocytes, UA NEGATIVE NEGATIVE   RBC / HPF 0-5 0 - 5 RBC/hpf   WBC, UA 0-5 0 - 5 WBC/hpf   Bacteria, UA RARE (A) NONE SEEN   Squamous  Epithelial / LPF 11-20 0 - 5   Mucus PRESENT    Hyaline Casts, UA PRESENT     Comment: Performed at [facility], [address], [place], [place] [address]  Comprehensive metabolic panel     Status: Abnormal   Collection Time: 06/30/2018  7:32 PM  Result Value Ref Range   Sodium 137 135 - 145 mmol/L   Potassium 3.5 3.5 - 5.1 mmol/L   Chloride 95 (L) 98 - 111 mmol/L   CO2 13 (L) 22 - 32 mmol/L   Glucose, Bld 116 (H) 70 - 99 mg/dL   BUN 58 (H) 8 - 23 mg/dL   Creatinine, Ser 5.29 (H) 0.44 - 1.00 mg/dL   Calcium 7.6 (L) 8.9 - 10.3 mg/dL   Total Protein 5.6 (L) 6.5 - 8.1 g/dL   Albumin 2.3 (L) 3.5 - 5.0 g/dL   AST 158 (H) 15 - 41 U/L   ALT 52 (H) 0 - 44 U/L   Alkaline Phosphatase 163 (H) 38 - 126 U/L   Total Bilirubin 1.4 (H) 0.3 - 1.2 mg/dL   GFR calc non Af Amer 8 (L) >60 mL/min   GFR calc Af Amer 9 (L) >60 mL/min   Anion gap 29 (H) 5 - 15    Comment: Performed at [facility], [address] [place], [place] [address]  CBC WITH DIFFERENTIAL     Status: Abnormal   Collection Time: 06/30/2018  7:32 PM  Result Value Ref Range   WBC 30.2 (H) 4.0 - 10.5 K/uL   RBC 5.25 (H) 3.87 - 5.11 MIL/uL   Hemoglobin 15.4 (H) 12.0 - 15.0 g/dL   HCT 52.1 (H) 36.0 - 46.0 %   MCV 99.2 80.0 - 100.0 fL   MCH 29.3 26.0 - 34.0 pg   MCHC 29.6 (L) 30.0 - 36.0 g/dL   RDW 14.8 11.5 - 15.5 %   Platelets 322 150 - 400 K/uL   nRBC 0.3 (H) 0.0 - 0.2 %   Neutrophils Relative % 79 %   Neutro Abs 23.7 (H) 1.7 - 7.7 K/uL   Lymphocytes Relative 11 %   Lymphs Abs 3.4 0.7 - 4.0 K/uL   Monocytes Relative 6 %   Monocytes Absolute 1.9 (H) 0.1 - 1.0 K/uL   Eosinophils Relative 0 %   Eosinophils Absolute 0.0 0.0 - 0.5 K/uL   Basophils Relative 1 %   Basophils Absolute 0.2 (H) 0.0 - 0.1 K/uL   Immature Granulocytes 3 %   Abs Immature Granulocytes 0.88 (H) 0.00 -  0.07 K/uL    Comment: Performed at [facility], [address] [place], [place] [address]  Troponin I - Once     Status: Abnormal   Collection Time:  07/01/2018  7:32 PM  Result Value Ref Range   Troponin I 0.35 (HH) <0.03 ng/mL    Comment: CRITICAL RESULT CALLED TO, READ BACK BY AND VERIFIED WITH: DOSS,M @ 2011 ON 06/28/2018 BY JUW Performed at [facility], [address] [place], [place] [address]   Magnesium     Status: None   Collection Time: 06/10/2018  7:32 PM  Result Value Ref Range   Magnesium 2.1 1.7 - 2.4 mg/dL    Comment: Performed at [facility], [address] [place], [place] [address]  CBG monitoring, ED     Status: Abnormal   Collection Time: 06/12/2018  7:39 PM  Result Value Ref Range   Glucose-Capillary 112 (H) 70 - 99 mg/dL  Blood gas, arterial     Status: Abnormal   Collection Time: 06/08/2018  7:43 PM  Result Value Ref Range   FIO2 100.00    Mode NON-REBREATHER OXYGEN MASK    pH, Arterial 7.178 (LL) 7.350 - 7.450    Comment:  RBV TALBOT T RN AT1940 06/06/17 LANC RRT   pCO2 arterial 31.8 (L) 32.0 - 48.0 mmHg   pO2, Arterial 71.6 (L) 83.0 - 108.0 mmHg   Bicarbonate 12.5 (L) 20.0 - 28.0 mmol/L   Acid-base deficit 15.4 (H) 0.0 - 2.0 mmol/L   O2 Saturation 86.3 %   Collection site RADIAL    Drawn by [id number]    Sample type ARTERIAL    Allens test (pass/fail) PASS PASS    Comment: Performed at [facility], [address] [place], [place] [address]  Blood Culture (routine x 2)     Status: None (Preliminary result)   Collection Time: 07/02/2018  8:16 PM  Result Value Ref Range   Specimen Description BLOOD LEFT HAND    Special Requests      BOTTLES DRAWN AEROBIC ONLY Blood Culture adequate volume Performed at [facility], [address], [place], [place] [address]    Culture PENDING    Report Status PENDING   CBG monitoring, ED     Status: None   Collection Time: 06/29/2018  9:58 PM  Result Value Ref Range   Glucose-Capillary 93 70 - 99 mg/dL  Blood Culture (routine x 2)     Status: None (Preliminary result)   Collection Time: 06/16/2018 10:58 PM  Result Value Ref Range   Specimen Description BLOOD LEFT ARM    Special  Requests      BOTTLES DRAWN AEROBIC AND ANAEROBIC Blood Culture adequate volume Performed at [facility], [address], [place], [place] [address]    Culture PENDING    Report Status PENDING   Lactic acid, plasma     Status: Abnormal   Collection Time: 07/01/2018 10:58 PM  Result Value Ref Range   Lactic Acid, Venous 11.8 (HH) 0.5 - 1.9 mmol/L    Comment: RESULTS CONFIRMED BY MANUAL DILUTION CRITICAL RESULT CALLED TO, READ BACK BY AND VERIFIED WITH: POINTDEXTER,M. AT 0020 ON Jun 30, 2018 BY EVA Performed at [facility], [address] [place], [place] [address]   Troponin I - Now Then Q6H     Status: Abnormal   Collection Time: 06/24/2018 10:58 PM  Result Value Ref Range   Troponin I 0.45 (HH) <0.03 ng/mL    Comment: CRITICAL RESULT CALLED TO, READ BACK BY AND VERIFIED  WITH: DOSS,M @ 6433 ON 06/16/2018 BY JUW Performed at [facility], [address] [place], [place] [address]   TSH     Status: Abnormal   Collection Time: 06/03/2018 10:58 PM  Result Value Ref Range   TSH 4.848 (H) 0.350 - 4.500 uIU/mL    Comment: Performed by a 3rd Generation assay with a functional sensitivity of <=0.01 uIU/mL. Performed at [facility], [address], [place], [place] [address]   Lactic acid, plasma     Status: Abnormal   Collection Time: June 16, 2018  1:16 AM  Result Value Ref Range   Lactic Acid, Venous 11.5 (HH) 0.5 - 1.9 mmol/L    Comment: CRITICAL RESULT CALLED TO, READ BACK BY AND VERIFIED WITH: Kimoni Pickerill,M @ 0158 ON June 16, 2018 BY JUW Performed at [facility], [address], [place], [place] [address]   CBG monitoring, ED     Status: Abnormal   Collection Time: Jun 16, 2018  5:52 AM  Result Value Ref Range   Glucose-Capillary 134 (H) 70 - 99 mg/dL   Comment 1 Notify RN    Comment 2 Document in Chart    Ct Head Wo Contrast  Result Date: 06/13/2018 CLINICAL DATA:  Unresponsive. Focal neuro deficit, < 6 hrs, stroke suspected EXAM: CT HEAD WITHOUT CONTRAST TECHNIQUE: Contiguous axial images were  obtained from the base of the skull through the vertex without intravenous contrast. COMPARISON:  Head CT and brain MRI 07/08/2016 FINDINGS: Brain: Minor chronic small vessel ischemia. No intracranial hemorrhage, mass effect, or midline shift. No hydrocephalus. The basilar cisterns are patent. No evidence of territorial infarct or acute ischemia. No extra-axial or intracranial fluid collection. Vascular: Atherosclerosis of skull base vasculature without hyperdense vessel. Skull: No fracture or focal lesion. Sinuses/Orbits: Right alveolar lucency on the right measures 2.1 cm, unchanged in size from prior MRI, not previously included in the CT field of view. Paranasal sinuses and mastoid air cells are clear. Orbits are unremarkable. Other: None. IMPRESSION: No acute intracranial abnormality. Electronically Signed   By: Keith Rake M.D.   On: 07/02/2018 21:04   Dg Chest Port 1 View  Result Date: 06/05/2018 CLINICAL DATA:  Unresponsive. EXAM: PORTABLE CHEST 1 VIEW COMPARISON:  Chest radiograph 04/05/2018. Chest CT 02/18/2018 FINDINGS: Progressive consolidative opacity in the right upper lung, patient with known lung mass and chronic lung disease on prior CT. Worsening patchy opacities in the left mid lung. Unchanged cardiomegaly with aortic atherosclerosis. Unchanged right hilar prominence likely combination of adenopathy and enlarged pulmonary artery. No pneumothorax or large pleural effusion. IMPRESSION: 1. Progressive consolidative right upper lobe opacity since radiographs 2 months ago. Prior CT demonstrated right upper lobe lung mass and chronic lung disease in the upper lobe. Findings may represent progression in malignancy versus pneumonia/postobstructive change. 2. Worsening patchy opacity in the left mid lung, progression of chronic lung disease versus superimposed atelectasis/edema. 3. Chronic cardiomegaly and Aortic Atherosclerosis (ICD10-I70.0). Unchanged enlarged right hilum likely combination of  adenopathy and enlarged pulmonary artery. Electronically Signed   By: Keith Rake M.D.   On: 06/05/2018 20:29    Pending Labs Unresulted Labs (From admission, onward)    Start     Ordered   Jun 16, 2018 0712  CBC  Once,   STAT     06/16/2018 0712   06-16-18 0500  Comprehensive metabolic panel  Tomorrow morning,   R     06/24/2018 2204   06-16-2018 0500  Troponin I -  Once,   STAT     06/16/2018 0500  06-20-2018 0412  Lactic acid, plasma  STAT Now then every 3 hours,   R     June 20, 2018 0411   06/28/2018 2240  Culture, Urine  Add-on,   R     06/10/2018 2239          Vitals/Pain Today's Vitals   2018/06/20 0500 06-20-18 0508 06-20-18 0524 2018-06-20 0553  BP: 103/79  (!) 108/95 138/61  Pulse:  68 67 67  Resp: (!) 24 (!) 30 (!) 25 (!) 27  Temp:    98 F (36.7 C)  TempSrc:    Oral  SpO2:  100% 99% 98%    Isolation Precautions No active isolations  Medications Medications  metroNIDAZOLE (FLAGYL) IVPB 500 mg ( Intravenous Rate/Dose Verify 06/20/2018 0530)  mometasone-formoterol (DULERA) 200-5 MCG/ACT inhaler 2 puff (2 puffs Inhalation Not Given 06/12/2018 2211)  heparin injection 5,000 Units (5,000 Units Subcutaneous Given 06-20-2018 0524)  ondansetron (ZOFRAN) tablet 4 mg (has no administration in time range)    Or  ondansetron (ZOFRAN) injection 4 mg (has no administration in time range)  acetaminophen (TYLENOL) tablet 650 mg (has no administration in time range)    Or  acetaminophen (TYLENOL) suppository 650 mg (has no administration in time range)  sodium bicarbonate 100 mEq in dextrose 5 % 1,000 mL infusion ( Intravenous Rate/Dose Verify 06-20-2018 0530)  ipratropium-albuterol (DUONEB) 0.5-2.5 (3) MG/3ML nebulizer solution 3 mL (3 mLs Nebulization Given 06-20-18 0207)  0.9 %  sodium chloride infusion (500 mLs Intravenous New Bag/Given 2018-06-20 0518)  naloxone [facility]) injection 0.4 mg (0.4 mg Intravenous Given 06/29/2018 2030)  sodium chloride 0.9 % bolus 1,000 mL (0 mLs Intravenous Stopped 07/02/2018 2207)     And  sodium chloride 0.9 % bolus 1,000 mL (0 mLs Intravenous Stopped 06/09/2018 2207)    And  sodium chloride 0.9 % bolus 1,000 mL (0 mLs Intravenous Stopped 06/05/2018 2207)  ceFEPIme (MAXIPIME) 2 g in sodium chloride 0.9 % 100 mL IVPB (0 g Intravenous Stopped 06/14/2018 2207)  vancomycin (VANCOCIN) 2,000 mg in sodium chloride 0.9 % 500 mL IVPB ( Intravenous Stopped 06/20/18 0057)  sodium bicarbonate injection 50 mEq (50 mEq Intravenous Given 06/30/2018 2239)  lactated ringers bolus 500 mL (0 mLs Intravenous Stopped Jun 20, 2018 0311)  lactated ringers bolus 500 mL (0 mLs Intravenous Stopped June 20, 2018 0517)    Mobility non-ambulatory High fall risk   Focused Assessments    Recommendations: See Admitting Provider Note  Report given to:   Additional Notes:

## 2018-07-03 NOTE — ED Notes (Signed)
Spoke with [employer] (pts poa) and nephew .  Ms [name] states the family wants the patient placed on comfort measures.  hospitalist paged.

## 2018-07-03 NOTE — ED Notes (Addendum)
Pt monitor states o hr.  Listened to heart x 1 min with no heartbeat heard.  No breath sounds heard.  TOD 1043.  hospitalist paged.

## 2018-07-03 NOTE — ED Notes (Signed)
Date and time results received: 06/21/18 @06 :34   Test: lactic acid 13.7 Critical Value:   Name of Provider Notified: Dr Olevia Bowens   Orders Received? Or Actions Taken?:  See chart

## 2018-07-03 NOTE — ED Notes (Signed)
Dr Olevia Bowens aware of vitals,

## 2018-07-03 NOTE — ED Notes (Signed)
Dr Tana Coast notified about pt's bp w/ no new orders.   Family member came to visit pt and spoke with hospitalist re: palliative care.  Family member states he has to go talk with his family he will be back.

## 2018-07-03 NOTE — ED Notes (Signed)
Pure wick placed on pt, diaper removed with little amount of urine noted in diaper, pt repositioned, Dr Olevia Bowens at bedside,

## 2018-07-03 NOTE — Death Summary Note (Signed)
DEATH SUMMARY   Patient Details  Name: [name] MRN: [id number] DOB: 02-May-1951  Admission/Discharge Information   Admit Date:  Jun 22, 2018  Date of Death: Date of Death: 06/23/18  Time of Death: Time of Death: 26-Aug-1041  Length of Stay: 1  Referring Physician: Fayrene Helper, MD   Reason(s) for Hospitalization  Acute hypoxic respiratory failure with septic shock  Diagnoses  Preliminary cause of death:   Acute hypoxic respiratory failure with septic shock  Secondary Diagnoses (including complications and co-morbidities):     Septic shock ([place]) with multiorgan dysfunction possibly due to postobstructive pneumonia Anion gap metabolic acidosis Elevated troponin likely demand ischemia Acute transaminitis likely due to multiorgan dysfunction Acute kidney injury Severe COPD Lung mass-patient had elected not to pursue diagnosis or treatment Prolonged QTC Essential hypertension Hypothyroidism  Brief Hospital Course (including significant findings, care, treatment, and services provided and events leading to death)  [name] was a 68 year old female with severe COPD, lung mass, diabetes, hypothyroidism, obesity presented to ED on 2022-06-22 after becoming unresponsive.  Patient presented from skilled nursing facility.  Over the past few weeks she had gradually declined, nursing staff found her unresponsive with hypoglycemia blood sugar of 56 and hypoxic.  She was given D50 by EMS with no significant change in the status.  Patient was found to have lung mass which she had elected not to get diagnosed or treated. In ED patient was found to have temp of 97.7, BP 73/48 with respiratory rate 22-26.  Patient was initially placed on nonrebreather mask, subsequently BiPAP.  Leukocytosis 30.2, troponin elevated 0.35, ABG showed pH of 7.1 PCO2 of 31, PO2 71 BUN 58, creatinine 5.29, baseline 1.1 Patient was admitted with septic shock with multiorgan dysfunction, acute hypoxic respiratory  failure.  1.  Unresponsive episode with septic shock, acute hypoxic respiratory failure, marked leukocytosis, hypotensive, likely secondary to postobstructive pneumonia -Chest x-ray showed progressive consolidative right upper lobe opacity, worsening patchy opacity in the left midlung, progression of chronic lung disease versus superimposed atelectasis/edema.  UA negative.  CT head showed no acute intracranial abnormality. -Blood cultures negative so far.  Patient was started on IV fluids with bicarb, IV vancomycin cefepime and metronidazole.  Goals of care was addressed with the family at the time of admission and requested DNR status. -Overnight patient was placed on BiPAP -This morning, no change or improvement in patient's status, hypotensive with SBP in 40s, unresponsive.  Goals of care was addressed with patient's family in detail at the bedside.  Patient's sister, [name], nephew and other family members requested complete comfort care status and not to prolong any suffering as per patient's wishes. -Patient was placed on complete comfort care goals.   Anion gap metabolic acidosis with acute kidney injury likely secondary to septic shock, multiorgan dysfunction from poor perfusion -Comfort care goals  Elevated troponin likely demand ischemia from septic shock -Comfort care goals  Elevated liver enzymes likely due to multiorgan dysfunction from septic shock  Severe COPD with lung mass -Patient had recent palliative care encounter and had elected not to pursue diagnosis or treatment of the lung mass  Patient passed on at 10:43 AM today.     Pertinent Labs and Studies  Significant Diagnostic Studies Ct Head Wo Contrast  Result Date: 2018-06-22 CLINICAL DATA:  Unresponsive. Focal neuro deficit, < 6 hrs, stroke suspected EXAM: CT HEAD WITHOUT CONTRAST TECHNIQUE: Contiguous axial images were obtained from the base of the skull through the vertex without intravenous contrast. COMPARISON:  Head CT and brain MRI 07/08/2016 FINDINGS: Brain: Minor chronic small vessel ischemia. No intracranial hemorrhage, mass effect, or midline shift. No hydrocephalus. The basilar cisterns are patent. No evidence of territorial infarct or acute ischemia. No extra-axial or intracranial fluid collection. Vascular: Atherosclerosis of skull base vasculature without hyperdense vessel. Skull: No fracture or focal lesion. Sinuses/Orbits: Right alveolar lucency on the right measures 2.1 cm, unchanged in size from prior MRI, not previously included in the CT field of view. Paranasal sinuses and mastoid air cells are clear. Orbits are unremarkable. Other: None. IMPRESSION: No acute intracranial abnormality. Electronically Signed   By: Keith Rake M.D.   On: 06/15/2018 21:04   Dg Chest Port 1 View  Result Date: 07/02/2018 CLINICAL DATA:  Unresponsive. EXAM: PORTABLE CHEST 1 VIEW COMPARISON:  Chest radiograph 04/05/2018. Chest CT 02/18/2018 FINDINGS: Progressive consolidative opacity in the right upper lung, patient with known lung mass and chronic lung disease on prior CT. Worsening patchy opacities in the left mid lung. Unchanged cardiomegaly with aortic atherosclerosis. Unchanged right hilar prominence likely combination of adenopathy and enlarged pulmonary artery. No pneumothorax or large pleural effusion. IMPRESSION: 1. Progressive consolidative right upper lobe opacity since radiographs 2 months ago. Prior CT demonstrated right upper lobe lung mass and chronic lung disease in the upper lobe. Findings may represent progression in malignancy versus pneumonia/postobstructive change. 2. Worsening patchy opacity in the left mid lung, progression of chronic lung disease versus superimposed atelectasis/edema. 3. Chronic cardiomegaly and Aortic Atherosclerosis (ICD10-I70.0). Unchanged enlarged right hilum likely combination of adenopathy and enlarged pulmonary artery. Electronically Signed   By: Keith Rake M.D.   On:  06/28/2018 20:29    Microbiology Recent Results (from the past 240 hour(s))  Blood Culture (routine x 2)     Status: None (Preliminary result)   Collection Time: 06/05/2018  8:16 PM  Result Value Ref Range Status   Specimen Description BLOOD LEFT HAND  Final   Special Requests   Final    BOTTLES DRAWN AEROBIC ONLY Blood Culture adequate volume   Culture   Final    NO GROWTH < 12 HOURS Performed at [facility], [address], [place], [place] [address]    Report Status PENDING  Incomplete  Blood Culture (routine x 2)     Status: None (Preliminary result)   Collection Time: 06/28/2018 10:58 PM  Result Value Ref Range Status   Specimen Description BLOOD LEFT ARM  Final   Special Requests   Final    BOTTLES DRAWN AEROBIC AND ANAEROBIC Blood Culture adequate volume   Culture   Final    NO GROWTH < 12 HOURS Performed at [facility], [address], [place], [place] [address]    Report Status PENDING  Incomplete    Lab Basic Metabolic Panel: Recent Labs  Lab 06/23/2018 1932 07-06-18 0511  NA 137 137  K 3.5 5.2*  CL 95* 99  CO2 13* 7*  GLUCOSE 116* 123*  BUN 58* 59*  CREATININE 5.29* 5.12*  CALCIUM 7.6* 6.6*  MG 2.1  --    Liver Function Tests: Recent Labs  Lab 06/12/2018 1932 Jul 06, 2018 0511  AST 158* QUANTITY NOT SUFFICIENT, UNABLE TO PERFORM TEST  ALT 52* QUANTITY NOT SUFFICIENT, UNABLE TO PERFORM TEST  ALKPHOS 163* 137*  BILITOT 1.4* 1.6*  PROT 5.6* 4.6*  ALBUMIN 2.3* 2.0*   No results for input(s): LIPASE, AMYLASE in the last 168 hours. No results for input(s): AMMONIA in the last 168 hours. CBC: Recent Labs  Lab  06/05/2018 1932  WBC 30.2*  NEUTROABS 23.7*  HGB 15.4*  HCT 52.1*  MCV 99.2  PLT 322   Cardiac Enzymes: Recent Labs  Lab 06/17/2018 1932 07/02/2018 2258 06/24/2018 0511  TROPONINI 0.35* 0.45* 0.58*   Sepsis Labs: Recent Labs  Lab 06/10/2018 1932 06/13/2018 2258 06-24-2018 0116 June 24, 2018 0511  WBC 30.2*  --   --   --   LATICACIDVEN  --  11.8* 11.5*  13.7*    Procedures/Operations  None    Tannor Pyon 06/24/18, 11:50 AM

## 2018-07-03 NOTE — ED Notes (Signed)
Date and time results received: 07/06/18 00:35   Test: lactic acid 11.8 Critical Value:   Name of Provider Notified: Arby Barrette NP  Orders Received? Or Actions Taken?: no additional orders given,

## 2018-07-03 NOTE — Progress Notes (Signed)
Patient is still unresponsive.

## 2018-07-03 NOTE — ED Notes (Signed)
Verified no cardiac activity via apical pulse x 1.  Time of death.

## 2018-07-03 NOTE — Progress Notes (Addendum)
Palliative Medicine RN Note: Palliative Medicine consult order noted. PMT provider will return to [facility] on 06/08/18. If the patient remains hospitalized, the consult will be evaluated at that time. If recommendations are needed in the interim, please call our office at [phone].   Marjie Skiff Aleysha Meckler, RN, BSN, [facility] Palliative Medicine Team 2018/06/27 9:22 AM Office [phone]  ADDENDUM: Referring MDs Rai and Denton Brick are not in [place], so I called ED RN and provided update that we can see tomorrow.  Marjie Skiff Giliana Vantil, RN, BSN, [facility] Palliative Medicine Team June 27, 2018 9:27 AM Office [phone]

## 2018-07-03 NOTE — ED Notes (Signed)
Pt suctioned x 1 with no results

## 2018-07-03 DEATH — deceased

## 2018-07-22 ENCOUNTER — Ambulatory Visit (HOSPITAL_COMMUNITY): Payer: Medicare Other | Admitting: Psychiatry
